# Patient Record
Sex: Male | Born: 1955 | Hispanic: No | Marital: Married | Smoking: Never smoker
Health system: Southern US, Community
[De-identification: ages and names within clinical notes are randomized; demographics above are authoritative.]

## PROBLEM LIST (undated history)

## (undated) DIAGNOSIS — H9190 Unspecified hearing loss, unspecified ear: Secondary | ICD-10-CM

## (undated) DIAGNOSIS — E1140 Type 2 diabetes mellitus with diabetic neuropathy, unspecified: Secondary | ICD-10-CM

## (undated) DIAGNOSIS — K219 Gastro-esophageal reflux disease without esophagitis: Secondary | ICD-10-CM

## (undated) DIAGNOSIS — I10 Essential (primary) hypertension: Secondary | ICD-10-CM

## (undated) DIAGNOSIS — E119 Type 2 diabetes mellitus without complications: Secondary | ICD-10-CM

## (undated) DIAGNOSIS — K3184 Gastroparesis: Principal | ICD-10-CM

## (undated) DIAGNOSIS — R053 Chronic cough: Principal | ICD-10-CM

## (undated) DIAGNOSIS — R42 Dizziness and giddiness: Principal | ICD-10-CM

## (undated) DIAGNOSIS — R55 Syncope and collapse: Secondary | ICD-10-CM

## (undated) DIAGNOSIS — G4700 Insomnia, unspecified: Secondary | ICD-10-CM

## (undated) DIAGNOSIS — R52 Pain, unspecified: Secondary | ICD-10-CM

## (undated) DIAGNOSIS — M25571 Pain in right ankle and joints of right foot: Principal | ICD-10-CM

## (undated) DIAGNOSIS — M25561 Pain in right knee: Secondary | ICD-10-CM

## (undated) DIAGNOSIS — E039 Hypothyroidism, unspecified: Principal | ICD-10-CM

## (undated) DIAGNOSIS — Z0000 Encounter for general adult medical examination without abnormal findings: Secondary | ICD-10-CM

## (undated) DIAGNOSIS — J209 Acute bronchitis, unspecified: Secondary | ICD-10-CM

## (undated) DIAGNOSIS — G43109 Migraine with aura, not intractable, without status migrainosus: Secondary | ICD-10-CM

## (undated) DIAGNOSIS — W108XXS Fall (on) (from) other stairs and steps, sequela: Secondary | ICD-10-CM

## (undated) DIAGNOSIS — Z1211 Encounter for screening for malignant neoplasm of colon: Secondary | ICD-10-CM

## (undated) DIAGNOSIS — E785 Hyperlipidemia, unspecified: Principal | ICD-10-CM

## (undated) HISTORY — DX: Essential (primary) hypertension: I10

## (undated) HISTORY — DX: Unspecified hearing loss, unspecified ear: H91.90

## (undated) HISTORY — DX: Type 2 diabetes mellitus with diabetic neuropathy, unspecified: E11.40

## (undated) HISTORY — DX: Gastro-esophageal reflux disease without esophagitis: K21.9

---

## 2010-06-12 HISTORY — PX: STOMACH SURGERY: SHX791

## 2011-02-02 ENCOUNTER — Inpatient Hospital Stay (INDEPENDENT_AMBULATORY_CARE_PROVIDER_SITE_OTHER)
Admission: RE | Admit: 2011-02-02 | Discharge: 2011-02-02 | Disposition: A | Discharge status: Home / Self Care | Payer: Medicaid Other | Source: Ambulatory Visit | Attending: Family Medicine | Admitting: Family Medicine

## 2011-02-02 DIAGNOSIS — E119 Type 2 diabetes mellitus without complications: Secondary | ICD-10-CM

## 2011-02-02 LAB — POCT URINALYSIS DIP (DEVICE)
Bilirubin Urine: NEGATIVE
Glucose, UA: 500 mg/dL — AB
Leukocytes, UA: NEGATIVE
Nitrite: NEGATIVE
Urobilinogen, UA: 0.2 mg/dL (ref 0.0–1.0)

## 2011-02-02 LAB — POCT I-STAT, CHEM 8
Chloride: 97 mEq/L (ref 96–112)
HCT: 50 % (ref 39.0–52.0)
Hemoglobin: 17 g/dL (ref 13.0–17.0)
Potassium: 4.3 mEq/L (ref 3.5–5.1)
Sodium: 136 mEq/L (ref 135–145)

## 2011-02-14 ENCOUNTER — Inpatient Hospital Stay (HOSPITAL_COMMUNITY)
Admission: EM | Admit: 2011-02-14 | Discharge: 2011-02-18 | DRG: 379 | Disposition: A | Discharge status: Home / Self Care | Payer: Medicaid Other | Source: Ambulatory Visit | Attending: Internal Medicine | Admitting: Internal Medicine

## 2011-02-14 DIAGNOSIS — R634 Abnormal weight loss: Secondary | ICD-10-CM | POA: Diagnosis present

## 2011-02-14 DIAGNOSIS — K259 Gastric ulcer, unspecified as acute or chronic, without hemorrhage or perforation: Secondary | ICD-10-CM | POA: Diagnosis present

## 2011-02-14 DIAGNOSIS — K648 Other hemorrhoids: Secondary | ICD-10-CM | POA: Diagnosis present

## 2011-02-14 DIAGNOSIS — K2970 Gastritis, unspecified, without bleeding: Secondary | ICD-10-CM | POA: Diagnosis present

## 2011-02-14 DIAGNOSIS — K2990 Gastroduodenitis, unspecified, without bleeding: Secondary | ICD-10-CM | POA: Diagnosis present

## 2011-02-14 DIAGNOSIS — K922 Gastrointestinal hemorrhage, unspecified: Principal | ICD-10-CM | POA: Diagnosis present

## 2011-02-14 DIAGNOSIS — IMO0001 Reserved for inherently not codable concepts without codable children: Secondary | ICD-10-CM | POA: Diagnosis present

## 2011-02-14 DIAGNOSIS — Z794 Long term (current) use of insulin: Secondary | ICD-10-CM

## 2011-02-14 LAB — COMPREHENSIVE METABOLIC PANEL
ALT: 23 U/L (ref 0–53)
Albumin: 4.1 g/dL (ref 3.5–5.2)
Alkaline Phosphatase: 83 U/L (ref 39–117)
BUN: 9 mg/dL (ref 6–23)
Chloride: 97 mEq/L (ref 96–112)
Glucose, Bld: 495 mg/dL — ABNORMAL HIGH (ref 70–99)
Potassium: 3.7 mEq/L (ref 3.5–5.1)
Sodium: 133 mEq/L — ABNORMAL LOW (ref 135–145)
Total Bilirubin: 0.9 mg/dL (ref 0.3–1.2)
Total Protein: 7 g/dL (ref 6.0–8.3)

## 2011-02-14 LAB — CBC
HCT: 39.1 % (ref 39.0–52.0)
Hemoglobin: 14 g/dL (ref 13.0–17.0)
MCHC: 35.8 g/dL (ref 30.0–36.0)
RDW: 12.7 % (ref 11.5–15.5)
WBC: 3.8 10*3/uL — ABNORMAL LOW (ref 4.0–10.5)

## 2011-02-14 LAB — DIFFERENTIAL
Basophils Absolute: 0 10*3/uL (ref 0.0–0.1)
Basophils Relative: 1 % (ref 0–1)
Lymphocytes Relative: 41 % (ref 12–46)
Monocytes Absolute: 0.3 10*3/uL (ref 0.1–1.0)
Neutro Abs: 2 10*3/uL (ref 1.7–7.7)

## 2011-02-14 LAB — OCCULT BLOOD, POC DEVICE: Fecal Occult Bld: POSITIVE

## 2011-02-15 LAB — HEMOGLOBIN AND HEMATOCRIT, BLOOD
HCT: 39.9 % (ref 39.0–52.0)
HCT: 40.6 % (ref 39.0–52.0)
HCT: 43.1 % (ref 39.0–52.0)
Hemoglobin: 14.1 g/dL (ref 13.0–17.0)
Hemoglobin: 14.7 g/dL (ref 13.0–17.0)
Hemoglobin: 14.9 g/dL (ref 13.0–17.0)
Hemoglobin: 15.9 g/dL (ref 13.0–17.0)

## 2011-02-15 LAB — HEMOGLOBIN A1C
Hgb A1c MFr Bld: 14.4 % — ABNORMAL HIGH (ref <5.7)
Mean Plasma Glucose: 367 mg/dL — ABNORMAL HIGH (ref <117)

## 2011-02-15 LAB — GLUCOSE, CAPILLARY
Glucose-Capillary: 191 mg/dL — ABNORMAL HIGH (ref 70–99)
Glucose-Capillary: 281 mg/dL — ABNORMAL HIGH (ref 70–99)
Glucose-Capillary: 87 mg/dL (ref 70–99)

## 2011-02-16 ENCOUNTER — Other Ambulatory Visit: Payer: Self-pay | Admitting: Gastroenterology

## 2011-02-16 LAB — GLUCOSE, CAPILLARY
Glucose-Capillary: 102 mg/dL — ABNORMAL HIGH (ref 70–99)
Glucose-Capillary: 105 mg/dL — ABNORMAL HIGH (ref 70–99)
Glucose-Capillary: 205 mg/dL — ABNORMAL HIGH (ref 70–99)
Glucose-Capillary: 96 mg/dL (ref 70–99)

## 2011-02-16 LAB — HEMOGLOBIN AND HEMATOCRIT, BLOOD
HCT: 37.1 % — ABNORMAL LOW (ref 39.0–52.0)
HCT: 37.2 % — ABNORMAL LOW (ref 39.0–52.0)
HCT: 37.6 % — ABNORMAL LOW (ref 39.0–52.0)
HCT: 40.7 % (ref 39.0–52.0)
HCT: 42.1 % (ref 39.0–52.0)
Hemoglobin: 13.3 g/dL (ref 13.0–17.0)
Hemoglobin: 13.8 g/dL (ref 13.0–17.0)

## 2011-02-16 LAB — BASIC METABOLIC PANEL
CO2: 29 mEq/L (ref 19–32)
Calcium: 9.6 mg/dL (ref 8.4–10.5)
Creatinine, Ser: 0.86 mg/dL (ref 0.50–1.35)
GFR calc Af Amer: >60 mL/min (ref >60)
GFR calc non Af Amer: >60 mL/min (ref >60)
Sodium: 145 mEq/L (ref 135–145)

## 2011-02-16 LAB — CBC
MCH: 31.1 pg (ref 26.0–34.0)
MCHC: 36.6 g/dL — ABNORMAL HIGH (ref 30.0–36.0)
MCV: 85 fL (ref 78.0–100.0)
Platelets: 141 10*3/uL — ABNORMAL LOW (ref 150–400)
RBC: 5.01 MIL/uL (ref 4.22–5.81)

## 2011-02-17 LAB — GLUCOSE, CAPILLARY
Glucose-Capillary: 222 mg/dL — ABNORMAL HIGH (ref 70–99)
Glucose-Capillary: 250 mg/dL — ABNORMAL HIGH (ref 70–99)
Glucose-Capillary: 304 mg/dL — ABNORMAL HIGH (ref 70–99)
Glucose-Capillary: 49 mg/dL — ABNORMAL LOW (ref 70–99)

## 2011-02-17 LAB — HEMOGLOBIN AND HEMATOCRIT, BLOOD
HCT: 41.5 % (ref 39.0–52.0)
HCT: 37.8 % — ABNORMAL LOW (ref 39.0–52.0)
Hemoglobin: 14.6 g/dL (ref 13.0–17.0)
Hemoglobin: 15.1 g/dL (ref 13.0–17.0)
Hemoglobin: 13.5 g/dL (ref 13.0–17.0)

## 2011-02-17 LAB — CBC
HCT: 38.3 % — ABNORMAL LOW (ref 39.0–52.0)
MCHC: 35.8 g/dL (ref 30.0–36.0)
Platelets: 138 10*3/uL — ABNORMAL LOW (ref 150–400)
RDW: 13.1 % (ref 11.5–15.5)
WBC: 4.7 10*3/uL (ref 4.0–10.5)

## 2011-02-18 LAB — CBC
HCT: 38 % — ABNORMAL LOW (ref 39.0–52.0)
MCHC: 36.3 g/dL — ABNORMAL HIGH (ref 30.0–36.0)
MCV: 84.8 fL (ref 78.0–100.0)
Platelets: 131 10*3/uL — ABNORMAL LOW (ref 150–400)
RDW: 12.8 % (ref 11.5–15.5)

## 2011-02-18 LAB — BASIC METABOLIC PANEL
BUN: 6 mg/dL (ref 6–23)
Creatinine, Ser: 0.72 mg/dL (ref 0.50–1.35)
GFR calc Af Amer: >60 mL/min (ref >60)
GFR calc non Af Amer: >60 mL/min (ref >60)

## 2011-02-18 LAB — GLUCOSE, CAPILLARY
Glucose-Capillary: 160 mg/dL — ABNORMAL HIGH (ref 70–99)
Glucose-Capillary: 227 mg/dL — ABNORMAL HIGH (ref 70–99)

## 2011-02-18 LAB — HEMOGLOBIN AND HEMATOCRIT, BLOOD
HCT: 38.9 % — ABNORMAL LOW (ref 39.0–52.0)
HCT: 39.1 % (ref 39.0–52.0)
Hemoglobin: 14.4 g/dL (ref 13.0–17.0)
Hemoglobin: 15.1 g/dL (ref 13.0–17.0)

## 2011-02-22 NOTE — Consult Note (Signed)
NAMEBRONCO, MCGRORY                    ACCOUNT NO.:  [id number]  MEDICAL RECORD NO.:  [id number]  LOCATION:  3023                         FACILITY:  [facility]  PHYSICIAN:  Anselmo Rod, MD, FACGDATE OF BIRTH:  Sep 27, 1955  DATE OF CONSULTATION:  02/15/2011 DATE OF DISCHARGE:                                CONSULTATION   REASON FOR CONSULTATION:  Rectal bleeding.  ASSESSMENT: 1. Rectal bleeding for 5 days with a 20-pound weight loss.  The exact     duration of the weight loss is not clear from the patient's family. 2. Newly diagnosed adult-onset diabetes with a hemoglobin A1c of     14.4%.  RECOMMENDATIONS: 1. EGD and colonoscopy tomorrow.  The risks and benefits of the     procedure been discussed with the patient and his son via an     interpreter.  Will prep the patient tonight. 2. CBC in a.m. 3. Further recommendations will be made after the procedure has been     done.  DISCUSSION:  Mr. Shawn Patton is a 55 year old [place] male whom I greeted to the [place] about 3 months ago.  According to his son, he was in his usual state of health until about 5 days ago when he noticed some rectal bleeding with BMs.  There is no history of melena.  His appetite has been fair, but he has lost about 20 pounds in the last few months. There is no history of abdominal pain, nausea, vomiting, fever, chills or rigors.  He has 1 bowel movement per day.  There is no history of recent change in bowel habits.  The patient denies a family history of cancer.  PAST MEDICAL HISTORY:  Noncontributory.  PAST SURGICAL HISTORY:  The patient has not had any surgeries in the past.  ALLERGIES:  No known drug allergies.  MEDICATIONS:  None at home.  REVIEW OF SYSTEMS:  Rectal bleeding, weight loss.  PHYSICAL EXAMINATION:  GENERAL:  The patient is an older [place] male in no acute distress. Temperature is 98.2, blood pressure 107/67, pulse 50 per minute, respiratory rate 16. HEENT:  Atraumatic,  normocephalic head.  Face is symmetric and preserved.  Oral mucosa without exudate. NECK:  Supple.  No JVD, thyromegaly or lymphadenopathy. CHEST:  Clear to auscultation.  S1 and S2 regular. ABDOMEN:  Soft, nontender with normal bowel sounds.  No hepatosplenomegaly. RECTAL:  Deferred.  Laboratory evaluation reveals a hemoglobin of 14.9 with a hematocrit of 40.3 today.  Hemoglobin A1c yesterday was 14.4 with mean plasma glucose of 367.  Stool was positive for occult blood.  CMP revealed a sodium of 133, potassium 3.7, chloride 97, CO2 of 27, glucose 495, BUN 9, creatinine 0.8, total bili of 0.9, alk phos 83, AST 21, ALT 23, total protein 7, albumin 4.1, calcium 9.6.  Considering the above-mentioned data, recommendations have been made. Further recommendation will be made in follow-up after the procedures have been done.     Anselmo Rod, MD, Clementeen Graham     JNM/MEDQ  D:  02/15/2011  T:  02/16/2011  Job:  [id number]  cc:   Fleet Contras, M.D. [employer]/product designer by  Charna Elizabeth M.D. on 02/22/2011 06:41:31 PM

## 2011-02-25 NOTE — Discharge Summary (Signed)
Shawn Patton, Shawn Patton                    ACCOUNT NO.:  [id number]  MEDICAL RECORD NO.:  [id number]  LOCATION:  3023                         FACILITY:  [facility]  PHYSICIAN:  Kela Millin, M.D.DATE OF BIRTH:  [birth year]  DATE OF ADMISSION:  02/14/2011 DATE OF DISCHARGE:  02/18/2011                        DISCHARGE SUMMARY - REFERRING   DISCHARGE DIAGNOSIS: 1. Rectal bleeding - with small internal hemorrhoids per colonoscopy     and moderate diffuse gastritis and small antral ulcer per EGD, also     possibility of an AVM in the duodenal bulb, capsule endoscopy, and     the a also with no clear evidence of active bleeding. 2. Uncontrolled diabetes mellitus - recently diagnosed with elevated     hemoglobin A1c of 14.4.  CONSULTATIONS:  Gastroenterology - Dr. Loreta Ave.  PROCEDURES AND STUDIES: 1. Esophagogastroduodenoscopy - moderate diffuse gastritis and small     clear based antral ulcer - status post biopsy and the patient to     follow up with Dr. Loreta Ave, outpatient for biopsy results and H.     Pylori results. 2. Colonoscopy - small internal hemorrhoids. 3. Capsule endoscopy - gastric antrum with ileal erosion identified.     In the duodenal bulb, there was possibility of an AVM.  No clear     evidence of active bleeding or old blood.  No evidence of any     inflammation, ulcerations, delusions, polyps, masses, or vascular     abnormalities.  BRIEF HISTORY:  The patient is a 55 year old [place] male with above- listed medical problems, who presented with complaints of rectal bleeding x3 days.  It was reported that he had seen Dr. Concepcion Elk with [facility] and following his evaluation, he was sent to the ED for further evaluation and management.  He reported lightheadedness with standing, but denied any syncope.  In the ED, the patient was noted to have a blood glucose of 495 and they indeed subsequently stated that the patient had just recently been started on Lantus and metformin by  Dr. Concepcion Elk.  The patient denied polyuria or polydipsia.  He did admit to weight loss.  He was seen in the ED and admitted for further evaluation and management.  HOSPITAL COURSE: 1. Rectal bleeding - following admission his hemoglobins were cycled     and he was placed on a proton pump inhibitor.  Gastroenterology was     consulted and Dr. Loreta Ave, saw the patient and then EGD was done and     the results as stated above with moderate diffuse gastritis and a     small clear based antral ulcer which was biopsied and sent for H.     Pylori as well.  A colonoscopy was done and some fresh blood was     noted in the right colon, but otherwise only small internal     hemorrhoids reported.  Following this capsule endoscopy was done     and it came back with the possibility of an AVM in the duodenal     bulb and also showed the gastric antrum with linear erosions.  The     patient was maintained on a proton  pump inhibitor and he did not     have any further bleeding during this hospital stay.  His     hemoglobin has remained stable - 15.1 with a hematocrit of 41.3     prior to discharge today.  The patient is to follow up with Dr.     Loreta Ave, for biopsy results as well as H. pylori results as an     outpatient. 2. Uncontrolled diabetes mellitus - his Accu-Cheks were monitored,     during this hospital stay he was placed on Lantus and cover with     sliding scale insulin.  He did receive inpatient diabetes     education.  His Lantus was adjusted to 18 units at bedtime and he     is to restart his metformin at 500 mg daily to keep a log of his     blood sugars as instructed and followup with Dr. Concepcion Elk,     outpatient for further monitoring and adjustment of his medications     as clinically appropriate for optimal blood glucose control. 3. Gastritis with antral gastric ulcer - as above.  The patient is to     continue the PPI upon discharge and he is to follow up with Dr.     Loreta Ave.  DISCHARGE  MEDICATIONS: 1. Lantus 18 units subcu nightly. 2. Metformin 500 mg p.o. daily. 3. Prilosec 40 mg p.o. daily.  FOLLOWUP CARE: 1. Dr. Concepcion Elk of very in 1-2 weeks - call for appointment. 2. Dr. Loreta Ave in 2-3 weeks - call for appointment.  DISCHARGE CONDITION:  Improved/stable.     Kela Millin, M.D.     ACV/MEDQ  D:  02/18/2011  T:  02/18/2011  Job:  [id number]  cc:   Anselmo Rod, MD, Bertrum Sol, M.D.  Electronically Signed by Donnalee Curry M.D. on 02/25/2011 05:42:10 PM

## 2011-03-06 NOTE — H&P (Signed)
Shawn Patton, Shawn Patton                    ACCOUNT NO.:  [id number]  MEDICAL RECORD NO.:  [id number]  LOCATION:  [facility]                         FACILITY:  [facility]  PHYSICIAN:  Jonny Ruiz, MD    DATE OF BIRTH:  1955/09/08  DATE OF ADMISSION:  02/14/2011 DATE OF DISCHARGE:                             HISTORY & PHYSICAL   CHIEF COMPLAINT:  Rectal bleeding.  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  The history is obtained with the help of his 2 sons since the patient speaks no [place], only [place]  The patient had apparently saw Dr. Concepcion Elk today and reported rectal bleeding for 15 days and was sent to the emergency department for further evaluation. No abdominal pain, nausea or vomiting.  There is lightheadedness upon standing without syncope.  When I obtained history from his son, the patient reported only bleeding for the last 3 days and not 15 days.  In addition, when he was evaluated in the emergency department, he was found with a blood sugar of 495.  The patient denies any polyuria, polydipsia but does admit to weight loss.  PAST MEDICAL HISTORY:  Denied.  SURGICAL HISTORY:  Denied.  MEDICATIONS:  None although the ED rapport says that he was started on metformin on February 02, 2011, but the patient tells me that he is not taking any medications.  ALLERGIES:  No known drug allergies.  FAMILY HISTORY:  Unable to obtain.  SOCIAL HISTORY:  The patient is unemployed.  He lives with his wife and 4 sons in one apartment.  Denies tobacco or drugs.  Rarely drinks.  REVIEW OF SYSTEMS:  See HPI.  PHYSICAL EXAMINATION:  VITAL SIGNS:  Blood pressure in the supine position 113/63, pulse 47, blood pressure in the upstanding position 122/70, heart rate 62, respirations 16, temperature 98.4. GENERAL APPEARANCE:  The patient is a middle age Asian man who appears in no  acute distress whatsoever. SKIN AND MUCOSA:  No pallor, jaundice or cyanosis. HEENT:  Unremarkable. NECK:  Supple.  No JVD.  No lymphadenopathy. HEART:  Regular S1-S2 without gallops, murmurs or rubs. LUNGS:  Clear to auscultation. ABDOMEN:  Soft, nontender without organomegaly or masses palpable. RECTAL:  Deferred since ED physician performed exam and Hemoccult was positive. EXTREMITIES:  Without clubbing, cyanosis or edema. NEUROLOGIC:  Nonfocal.  LABORATORY DATA:  Comprehensive metabolic panel is normal except for a glucose of 495.  CBC normal with a hemoglobin of 14, hematocrit 39.1, occult blood positive.  ASSESSMENT AND PLAN:  A 55 year old male [place] with no significant past medical history admitted to the hospital for lower GI bleed without orthostatic changes and with a normal H and H Hemoccult positive as well as incidental hyperglycemia at 495 without DKA.  PROBLEM LIST: 1. Lower gastrointestinal bleed. 2. Hyperglycemia, new-onset diabetes mellitus type 2.  PLAN: 1. Admit the patient to the hospital. 2. Two large bore IV access. 3. H and H q. 4 hours. 4. PT/PTT. 5. Lantus 15 units basal followed by insulin NovoLog sliding scale     coverage. 6. He  will obtain hemoglobin A1c and lipid profile. 7. Obtain GI consult for colonoscopy.  Repeat BMET in a.m.          ______________________________ Jonny Ruiz, MD     GL/MEDQ  D:  02/14/2011  T:  02/14/2011  Job:  [id number]  Electronically Signed by Jonny Ruiz MD on 03/06/2011 04:10:53 PM

## 2011-03-10 ENCOUNTER — Encounter: Payer: Medicaid Other | Attending: Internal Medicine | Admitting: Dietician

## 2011-03-10 ENCOUNTER — Encounter: Payer: Self-pay | Admitting: Dietician

## 2011-03-10 DIAGNOSIS — E119 Type 2 diabetes mellitus without complications: Secondary | ICD-10-CM | POA: Insufficient documentation

## 2011-03-10 DIAGNOSIS — Z713 Dietary counseling and surveillance: Secondary | ICD-10-CM | POA: Insufficient documentation

## 2011-03-10 NOTE — Progress Notes (Signed)
   Medical Nutrition Therapy:  Appt start time: 0900 end time:  1100.  Assessment:  Primary concerns today: New onset of DM type 2.  Questions what he can eat.  He is from [place] and does not speak or understand English.  His son is with him and he speaks and reads [place] fluently.  The interpreter is present also and assisting in the session.  His weight today is 152.9 lbs.  He reports he weighed 70 KG prior to his arrival in this country.  In early September he was hospitalized for hyperglycemia which was discovered when he went for an evaluation of rectal bleeding.  He was admitted to the hospital and on 02/14/11 found to have a HgA1C of 14.4%.  His glucose levels were stabilized and and he was found to have mild gastritis and internal hemorrhoids.  He has been trying to follow a diet that his son helps him with and is riding a stationary bike for exercise.  MEDICATIONS: Lantus insulin, Metformin, Nexium, Omeprazol, and Bupap for discomfort as needed.  DIETARY INTAKE:  24-hr recall:  B (7:30 AM): 2 bread and apple (1/2) water (warm and salty), Milk 1 glass  L (12:00 PM): Rice (1/2 C) or bread Flat bread (15 gm)some vegetable with curry and sometimes chicken with curry.  Snk (4:00 PM): Salad with some fruit D (8:00 PM):bread (2)or noodles (1-2 cups)vegetables with curry (nonstarchy 1 C) Beverages: milk, water,   Recent physical activity: Riding an exercise bike 60 minutes in the AM and again in the PM  Estimated energy needs: 1500-1600 calories/day 165-170 g carbohydrates 110-115 g protein 40-42 g fat  Progress Towards Goal(s):  In progress.   Nutritional Diagnosis:  [place]-2.1 Inpaired nutrition utilization As related to glucose.  As evidenced by New onset DM type 2 and HgA1C of 14.4%.    Intervention:  Nutrition Current intake which his son helps with planing is deficit at times in protein.  Has been limiting carbohydrate.  I used the plate method for teaching and used an actual plate size  picture and food models to help with food choices and portions for each meal and snack.  Recommended adding an egg as protein at the morning meal and to add protein to the mid-day or evening meal  Provided models for the actual serving sizes.  Handouts given during visit include:  American Diabetes Association booklet: Living Well with Diabetes  American Diabetes booklet:  Where Do I Begin; Living with type 2 diabetes. These text will support his son in helping him to care for himself.  I have no references in [place].  Monitoring/Evaluation:  Dietary intake, exercise, blood glucose levels, and body weight in 6-8 weeks.  The son is to call for an appointment.

## 2011-03-10 NOTE — Patient Instructions (Signed)
   Call Dr. Jacelyn Pi office (the eye doctor), for an earlier appointment.  The Metformin medication is 500 mg per day.  Take with food.  Keep the carbohydrate/starch to 1/4 of the plate or rice 1/2 cup and noodles 3/4 to 1 cup and the starchy beans to 1 cup.  The fruit use the serving sizes in the handout.  The milk is 8 oz for 1 serving.  Meals need to be carbohydrate serving, non-starchy vegetable, and a protein.  Snacks can be a bread or a fruit.  Add a protein to the breakfast meal.  Can use the egg or might add a vegetable.  Check the feet each day.  Bring the meter and glucose log book to each appointment.  Bring the medications to all visits.  Plan to follow up with me in 6-8 weeks about the second week in November  (12-16th)  Call if you have questions.  [phone])

## 2011-04-24 ENCOUNTER — Ambulatory Visit
Admission: RE | Admit: 2011-04-24 | Discharge: 2011-04-24 | Disposition: A | Discharge status: Home / Self Care | Payer: No Typology Code available for payment source | Source: Ambulatory Visit | Attending: Infectious Diseases | Admitting: Infectious Diseases

## 2011-04-24 ENCOUNTER — Other Ambulatory Visit: Payer: Self-pay | Admitting: Infectious Diseases

## 2011-04-24 DIAGNOSIS — R7611 Nonspecific reaction to tuberculin skin test without active tuberculosis: Secondary | ICD-10-CM

## 2012-08-14 ENCOUNTER — Encounter (HOSPITAL_COMMUNITY): Payer: Self-pay | Admitting: *Deleted

## 2012-08-14 ENCOUNTER — Emergency Department (HOSPITAL_COMMUNITY)
Admission: EM | Admit: 2012-08-14 | Discharge: 2012-08-14 | Disposition: A | Discharge status: Home / Self Care | Payer: No Typology Code available for payment source | Source: Home / Self Care

## 2012-08-14 DIAGNOSIS — E119 Type 2 diabetes mellitus without complications: Secondary | ICD-10-CM

## 2012-08-14 MED ORDER — METFORMIN HCL 500 MG PO TABS: 500.0000 mg | ORAL_TABLET | Freq: Two times a day (BID) | ORAL | Status: DC

## 2012-08-14 MED ORDER — ESOMEPRAZOLE MAGNESIUM 40 MG PO CPDR: 40.0000 mg | DELAYED_RELEASE_CAPSULE | Freq: Every day | ORAL | Status: DC

## 2012-08-14 MED ORDER — INSULIN GLARGINE 100 UNIT/ML ~~LOC~~ SOLN: 15.0000 [IU] | Freq: Every day | SUBCUTANEOUS | Status: DC

## 2012-08-14 MED ORDER — FREESTYLE SYSTEM KIT: 1.0000 | PACK | Freq: Three times a day (TID) | Status: DC

## 2012-08-14 NOTE — ED Notes (Signed)
Patient presents for follow up and medication refill.

## 2012-08-14 NOTE — ED Notes (Signed)
Collected lavender topped venipuncture sample per physician orders for A1C.

## 2012-08-14 NOTE — ED Provider Notes (Signed)
Patient Demographics  Shawn Patton, is a 57 y.o. male  [id number]  [id number]  DOB - 1955-07-27  Chief Complaint  Patient presents with  . Follow-up        Subjective:   Shawn Patton today has, No headache, No chest pain, No abdominal pain - No Nausea, No new weakness tingling or numbness, No Cough - SOB.    Objective:    Filed Vitals:   08/14/12 1243  BP: 152/77  Pulse: 53  Temp: 98.7 F (37.1 C)  TempSrc: Oral  Resp: 16  SpO2: 98%     Exam  Awake Alert, Oriented X 3, No new F.N deficits, Normal affect [place].AT,PERRAL Supple Neck,No JVD, No cervical lymphadenopathy appriciated.  Symmetrical Chest wall movement, Good air movement bilaterally, CTAB RRR,No Gallops,Rubs or new Murmurs, No Parasternal Heave +ve B.Sounds, Abd Soft, Non tender, No organomegaly appriciated, No rebound - guarding or rigidity. No Cyanosis, Clubbing or edema, No new Rash or bruise       Data Review   CBC No results found for this basename: WBC, HGB, HCT, PLT, MCV, MCH, MCHC, RDW, NEUTRABS, LYMPHSABS, MONOABS, EOSABS, BASOSABS, BANDABS, BANDSABD,  in the last 168 hours  Chemistries   No results found for this basename: NA, K, CL, CO2, GLUCOSE, BUN, CREATININE, GFRCGP, CALCIUM, MG, AST, ALT, ALKPHOS, BILITOT,  in the last 168 hours ------------------------------------------------------------------------------------------------------------------ No results found for this basename: HGBA1C,  in the last 72 hours ------------------------------------------------------------------------------------------------------------------ No results found for this basename: CHOL, HDL, LDLCALC, TRIG, CHOLHDL, LDLDIRECT,  in the last 72 hours ------------------------------------------------------------------------------------------------------------------ No results found for this basename: TSH, T4TOTAL, FREET3, T3FREE, THYROIDAB,  in the last 72  hours ------------------------------------------------------------------------------------------------------------------ No results found for this basename: VITAMINB12, FOLATE, FERRITIN, TIBC, IRON, RETICCTPCT,  in the last 72 hours  Coagulation profile  No results found for this basename: INR, PROTIME,  in the last 168 hours  Lab Results  Component Value Date   HGBA1C 14.4* 02/14/2011      Prior to Admission medications   Medication Sig Start Date End Date Taking? Authorizing Provider  ACETAMINOPHEN-BUTALBITAL (BUPAP) 50-650 MG TABS Take 1 tablet by mouth 2 (two) times daily as needed.      Historical Provider, MD  esomeprazole (NEXIUM) 40 MG capsule Take 1 capsule (40 mg total) by mouth daily before breakfast. 08/14/12   Leroy Sea, MD  glucose monitoring kit (FREESTYLE) monitoring kit 1 each by Does not apply route 4 (four) times daily - after meals and at bedtime. 1 month Diabetic Testing Supplies for QAC-QHS accuchecks. 08/14/12   Leroy Sea, MD  insulin glargine (LANTUS) 100 UNIT/ML injection Inject 15 Units into the skin at bedtime. 08/14/12   Leroy Sea, MD  metFORMIN (GLUCOPHAGE) 500 MG tablet Take 1 tablet (500 mg total) by mouth 2 (two) times daily with a meal. 08/14/12   Leroy Sea, MD     Assessment & Plan   Patient with history of diabetes mellitus type 2 her for routine followup visit and to get his medication refills. Remember is the interpreter. Says sugars have been running between 120-160. Have increased his Glucophage to twice a day, continue his home dose Lantus, will check A1c, have instructed to do Accu-Cheks q. a.c. at bedtime and bring the logbook next time on his visit so that we can check glycemic control closely.    Follow-up Information   Follow up with Primary care provider. Schedule an appointment as soon as possible for a visit in 1 month. Event organiser  your Accu-Chek logbook as instructed)        Leroy Sea M.D on 08/14/2012 at 12:52  PM  Leroy Sea, MD 08/14/12 [phone]

## 2012-08-15 LAB — HEMOGLOBIN A1C: Hgb A1c MFr Bld: 10.9 % — ABNORMAL HIGH (ref <5.7)

## 2012-11-22 NOTE — Progress Notes (Signed)
Quick Note:  Please hava patient come back for DM Management ______

## 2012-12-02 ENCOUNTER — Telehealth: Payer: Self-pay | Admitting: *Deleted

## 2012-12-02 NOTE — Telephone Encounter (Signed)
12/02/12 Patient made aware message left via telephone to come back for  Diabetes Management. P.Nikayla Madaris,RN BSN [facility]

## 2014-04-26 ENCOUNTER — Inpatient Hospital Stay (HOSPITAL_COMMUNITY)
Admission: EM | Admit: 2014-04-26 | Discharge: 2014-04-28 | DRG: 638 | Disposition: A | Discharge status: Home / Self Care | Payer: Self-pay | Attending: Internal Medicine | Admitting: Internal Medicine

## 2014-04-26 ENCOUNTER — Encounter (HOSPITAL_COMMUNITY): Payer: Self-pay

## 2014-04-26 DIAGNOSIS — R112 Nausea with vomiting, unspecified: Secondary | ICD-10-CM | POA: Diagnosis present

## 2014-04-26 DIAGNOSIS — E1310 Other specified diabetes mellitus with ketoacidosis without coma: Principal | ICD-10-CM | POA: Diagnosis present

## 2014-04-26 DIAGNOSIS — R109 Unspecified abdominal pain: Secondary | ICD-10-CM | POA: Diagnosis present

## 2014-04-26 DIAGNOSIS — R1013 Epigastric pain: Secondary | ICD-10-CM

## 2014-04-26 DIAGNOSIS — R7989 Other specified abnormal findings of blood chemistry: Secondary | ICD-10-CM | POA: Diagnosis present

## 2014-04-26 DIAGNOSIS — E871 Hypo-osmolality and hyponatremia: Secondary | ICD-10-CM | POA: Diagnosis present

## 2014-04-26 DIAGNOSIS — Z23 Encounter for immunization: Secondary | ICD-10-CM

## 2014-04-26 DIAGNOSIS — E1110 Type 2 diabetes mellitus with ketoacidosis without coma: Secondary | ICD-10-CM | POA: Diagnosis present

## 2014-04-26 DIAGNOSIS — E876 Hypokalemia: Secondary | ICD-10-CM | POA: Diagnosis present

## 2014-04-26 DIAGNOSIS — Z79899 Other long term (current) drug therapy: Secondary | ICD-10-CM

## 2014-04-26 DIAGNOSIS — E0810 Diabetes mellitus due to underlying condition with ketoacidosis without coma: Secondary | ICD-10-CM

## 2014-04-26 DIAGNOSIS — Z794 Long term (current) use of insulin: Secondary | ICD-10-CM

## 2014-04-26 HISTORY — DX: Type 2 diabetes mellitus without complications: E11.9

## 2014-04-26 LAB — COMPREHENSIVE METABOLIC PANEL
ALK PHOS: 112 U/L (ref 39–117)
ALT: 23 U/L (ref 0–53)
AST: 19 U/L (ref 0–37)
Albumin: 4 g/dL (ref 3.5–5.2)
Anion gap: 20 — ABNORMAL HIGH (ref 5–15)
BUN: 18 mg/dL (ref 6–23)
CO2: 18 meq/L — AB (ref 19–32)
Calcium: 9.3 mg/dL (ref 8.4–10.5)
Chloride: 95 mEq/L — ABNORMAL LOW (ref 96–112)
Creatinine, Ser: 0.66 mg/dL (ref 0.50–1.35)
GFR calc non Af Amer: >90 mL/min (ref >90)
GLUCOSE: 475 mg/dL — AB (ref 70–99)
POTASSIUM: 4.5 meq/L (ref 3.7–5.3)
SODIUM: 133 meq/L — AB (ref 137–147)
TOTAL PROTEIN: 7.5 g/dL (ref 6.0–8.3)
Total Bilirubin: 1.1 mg/dL (ref 0.3–1.2)

## 2014-04-26 LAB — I-STAT VENOUS BLOOD GAS, ED
ACID-BASE DEFICIT: 5 mmol/L — AB (ref 0.0–2.0)
BICARBONATE: 22.4 meq/L (ref 20.0–24.0)
O2 SAT: 38 %
PO2 VEN: 26 mmHg — AB (ref 30.0–45.0)
TCO2: 24 mmol/L (ref 0–100)
pCO2, Ven: 48.1 mmHg (ref 45.0–50.0)
pH, Ven: 7.275 (ref 7.250–7.300)

## 2014-04-26 LAB — CBG MONITORING, ED: Glucose-Capillary: 512 mg/dL — ABNORMAL HIGH (ref 70–99)

## 2014-04-26 LAB — URINALYSIS, ROUTINE W REFLEX MICROSCOPIC
BILIRUBIN URINE: NEGATIVE
HGB URINE DIPSTICK: NEGATIVE
KETONES UR: 40 mg/dL — AB
LEUKOCYTES UA: NEGATIVE
Nitrite: NEGATIVE
PROTEIN: NEGATIVE mg/dL
Specific Gravity, Urine: 1.035 — ABNORMAL HIGH (ref 1.005–1.030)
Urobilinogen, UA: 0.2 mg/dL (ref 0.0–1.0)
pH: 5 (ref 5.0–8.0)

## 2014-04-26 LAB — CBC
HCT: 41.7 % (ref 39.0–52.0)
HEMOGLOBIN: 14.6 g/dL (ref 13.0–17.0)
MCH: 30.6 pg (ref 26.0–34.0)
MCHC: 35 g/dL (ref 30.0–36.0)
MCV: 87.4 fL (ref 78.0–100.0)
Platelets: 126 10*3/uL — ABNORMAL LOW (ref 150–400)
RBC: 4.77 MIL/uL (ref 4.22–5.81)
RDW: 11.9 % (ref 11.5–15.5)
WBC: 4.2 10*3/uL (ref 4.0–10.5)

## 2014-04-26 LAB — URINE MICROSCOPIC-ADD ON

## 2014-04-26 LAB — BASIC METABOLIC PANEL
ANION GAP: 13 (ref 5–15)
ANION GAP: 13 (ref 5–15)
BUN: 13 mg/dL (ref 6–23)
BUN: 12 mg/dL (ref 6–23)
CHLORIDE: 107 meq/L (ref 96–112)
CO2: 19 mEq/L (ref 19–32)
CO2: 18 mEq/L — ABNORMAL LOW (ref 19–32)
Calcium: 8.2 mg/dL — ABNORMAL LOW (ref 8.4–10.5)
Calcium: 8.2 mg/dL — ABNORMAL LOW (ref 8.4–10.5)
Chloride: 105 mEq/L (ref 96–112)
Creatinine, Ser: 0.59 mg/dL (ref 0.50–1.35)
Creatinine, Ser: 0.59 mg/dL (ref 0.50–1.35)
GFR calc Af Amer: >90 mL/min (ref >90)
Glucose, Bld: 276 mg/dL — ABNORMAL HIGH (ref 70–99)
Glucose, Bld: 210 mg/dL — ABNORMAL HIGH (ref 70–99)
POTASSIUM: 3.8 meq/L (ref 3.7–5.3)
POTASSIUM: 3.6 meq/L — AB (ref 3.7–5.3)
SODIUM: 138 meq/L (ref 137–147)
Sodium: 137 mEq/L (ref 137–147)

## 2014-04-26 LAB — MRSA PCR SCREENING: MRSA BY PCR: NEGATIVE

## 2014-04-26 LAB — TSH: TSH: 0.019 u[IU]/mL — ABNORMAL LOW (ref 0.350–4.500)

## 2014-04-26 MED ORDER — DEXTROSE-NACL 5-0.45 % IV SOLN: INTRAVENOUS | Status: DC

## 2014-04-26 MED ORDER — DEXTROSE-NACL 5-0.45 % IV SOLN
INTRAVENOUS | Status: DC
  Administered 2014-04-26: 1000 mL via INTRAVENOUS

## 2014-04-26 MED ORDER — SODIUM CHLORIDE 0.9 % IV SOLN
1000.0000 mL | Freq: Once | INTRAVENOUS | Status: AC
  Administered 2014-04-26: 1000 mL via INTRAVENOUS

## 2014-04-26 MED ORDER — SODIUM CHLORIDE 0.9 % IV SOLN
INTRAVENOUS | Status: DC
  Administered 2014-04-26: 3.1 [IU]/h via INTRAVENOUS
  Filled 2014-04-26: qty 2.5

## 2014-04-26 MED ORDER — PANTOPRAZOLE SODIUM 40 MG PO TBEC
40.0000 mg | DELAYED_RELEASE_TABLET | Freq: Every day | ORAL | Status: DC
  Administered 2014-04-26: 40 mg via ORAL
  Filled 2014-04-26: qty 1

## 2014-04-26 MED ORDER — HEPARIN SODIUM (PORCINE) 5000 UNIT/ML IJ SOLN
5000.0000 [IU] | Freq: Three times a day (TID) | INTRAMUSCULAR | Status: DC
  Administered 2014-04-26 – 2014-04-28 (×4): 5000 [IU] via SUBCUTANEOUS
  Filled 2014-04-26 (×8): qty 1

## 2014-04-26 MED ORDER — DEXTROSE 50 % IV SOLN: 25.0000 mL | INTRAVENOUS | Status: DC | PRN

## 2014-04-26 MED ORDER — SODIUM CHLORIDE 0.9 % IV SOLN
INTRAVENOUS | Status: DC
  Administered 2014-04-26: 7.1 [IU]/h via INTRAVENOUS
  Administered 2014-04-27: 3.2 [IU]/h via INTRAVENOUS
  Administered 2014-04-27: 5.6 [IU]/h via INTRAVENOUS
  Administered 2014-04-27: 1 [IU]/h via INTRAVENOUS
  Filled 2014-04-26: qty 2.5

## 2014-04-26 MED ORDER — SODIUM CHLORIDE 0.9 % IV SOLN: INTRAVENOUS | Status: AC

## 2014-04-26 MED ORDER — POTASSIUM CHLORIDE 10 MEQ/100ML IV SOLN
10.0000 meq | INTRAVENOUS | Status: AC
  Administered 2014-04-26 (×2): 10 meq via INTRAVENOUS
  Filled 2014-04-26 (×2): qty 100

## 2014-04-26 MED ORDER — SODIUM CHLORIDE 0.9 % IV SOLN
1000.0000 mL | INTRAVENOUS | Status: DC
  Administered 2014-04-26: 1000 mL via INTRAVENOUS

## 2014-04-26 MED ORDER — SODIUM CHLORIDE 0.9 % IV SOLN
INTRAVENOUS | Status: DC
  Administered 2014-04-26: 1000 mL via INTRAVENOUS

## 2014-04-26 NOTE — ED Notes (Addendum)
Pt is diabetic and for the past month has had a hard time keeping his sugar regulated. Pt told his son he didn't feel well and has been vomiting and urinating frequently. Has not any of his medications in about 2 months the son says.

## 2014-04-26 NOTE — ED Notes (Signed)
Attempted report 

## 2014-04-26 NOTE — Progress Notes (Signed)
Rec'd pt from ER via stretcher on monitor. Pt assisted in to bed CHG bath administered and VS obtained.

## 2014-04-26 NOTE — Progress Notes (Signed)
Obtained history with help from his son who served as the interpreter.

## 2014-04-26 NOTE — ED Provider Notes (Addendum)
CSN: [id number]     Arrival date & time 04/26/14  1445 History   First MD Initiated Contact with Patient 04/26/14 1616     Chief Complaint  Patient presents with  . Hyperglycemia     (Consider location/radiation/quality/duration/timing/severity/associated sxs/prior Treatment) HPI Comments: Patient presents to the ED with a chief complaint of hyperglycemia.  Patient states that he is an insulin controlled diabetic.  He states that he has been out of his diabetes medications for 2-3 months.  He states that during this time, he has noticed increased polydipsia, polyuria, intermittent nausea and vomiting, peripheral neuropathy, and blurred vision.  He states that today the symptoms got bad enough to be evaluated.  He has not tried taking anything for his symptoms.  He states that he does not have the financial resources to get his medications.  He denies any other symptoms at this time.  The history is provided by the patient. The history is limited by a language barrier. A language interpreter was used.    Past Medical History  Diagnosis Date  . Diabetes mellitus without complication    History reviewed. No pertinent past surgical history. History reviewed. No pertinent family history. History  Substance Use Topics  . Smoking status: Never Smoker   . Smokeless tobacco: Never Used  . Alcohol Use: No    Review of Systems  Constitutional: Negative for fever and chills.  Respiratory: Negative for shortness of breath.   Cardiovascular: Negative for chest pain.  Gastrointestinal: Positive for nausea, vomiting and abdominal pain. Negative for diarrhea and constipation.  Genitourinary: Negative for dysuria.  All other systems reviewed and are negative.     Allergies  Review of patient's allergies indicates no known allergies.  Home Medications   Prior to Admission medications   Medication Sig Start Date End Date Taking? Authorizing Provider  ACETAMINOPHEN-BUTALBITAL (BUPAP) 50-650  MG TABS Take 1 tablet by mouth 2 (two) times daily as needed.      Historical Provider, MD  esomeprazole (NEXIUM) 40 MG capsule Take 1 capsule (40 mg total) by mouth daily before breakfast. 08/14/12   Thurnell Lose, MD  glucose monitoring kit (FREESTYLE) monitoring kit 1 each by Does not apply route 4 (four) times daily - after meals and at bedtime. 1 month Diabetic Testing Supplies for QAC-QHS accuchecks. 08/14/12   Thurnell Lose, MD  insulin glargine (LANTUS) 100 UNIT/ML injection Inject 15 Units into the skin at bedtime. 08/14/12   Thurnell Lose, MD  metFORMIN (GLUCOPHAGE) 500 MG tablet Take 1 tablet (500 mg total) by mouth 2 (two) times daily with a meal. 08/14/12   Thurnell Lose, MD   BP 116/63 mmHg  Pulse 76  Temp(Src) 98.4 F (36.9 C) (Oral)  Resp 18  SpO2 100% Physical Exam  Constitutional: He is oriented to person, place, and time. He appears well-developed and well-nourished.  HENT:  Head: Normocephalic and atraumatic.  Eyes: Conjunctivae and EOM are normal. Pupils are equal, round, and reactive to light. Right eye exhibits no discharge. Left eye exhibits no discharge. No scleral icterus.  Neck: Normal range of motion. Neck supple. No JVD present.  Cardiovascular: Normal rate, regular rhythm and normal heart sounds.  Exam reveals no gallop and no friction rub.   No murmur heard. Pulmonary/Chest: Effort normal and breath sounds normal. No respiratory distress. He has no wheezes. He has no rales. He exhibits no tenderness.  Abdominal: Soft. He exhibits no distension and no mass. There is no tenderness. There is  no rebound and no guarding.  Musculoskeletal: Normal range of motion. He exhibits no edema or tenderness.  Neurological: He is alert and oriented to person, place, and time.  Skin: Skin is warm and dry.  Psychiatric: He has a normal mood and affect. His behavior is normal. Judgment and thought content normal.  Nursing note and vitals reviewed.   ED Course  Procedures  (including critical care time) Results for orders placed or performed during the hospital encounter of 04/26/14  Comprehensive metabolic panel  Result Value Ref Range   Sodium 133 (L) 137 - 147 mEq/L   Potassium 4.5 3.7 - 5.3 mEq/L   Chloride 95 (L) 96 - 112 mEq/L   CO2 18 (L) 19 - 32 mEq/L   Glucose, Bld 475 (H) 70 - 99 mg/dL   BUN 18 6 - 23 mg/dL   Creatinine, Ser 0.66 0.50 - 1.35 mg/dL   Calcium 9.3 8.4 - 10.5 mg/dL   Total Protein 7.5 6.0 - 8.3 g/dL   Albumin 4.0 3.5 - 5.2 g/dL   AST 19 0 - 37 U/L   ALT 23 0 - 53 U/L   Alkaline Phosphatase 112 39 - 117 U/L   Total Bilirubin 1.1 0.3 - 1.2 mg/dL   GFR calc non Af Amer >90 >90 mL/min   GFR calc Af Amer >90 >90 mL/min   Anion gap 20 (H) 5 - 15  CBC  Result Value Ref Range   WBC 4.2 4.0 - 10.5 K/uL   RBC 4.77 4.22 - 5.81 MIL/uL   Hemoglobin 14.6 13.0 - 17.0 g/dL   HCT 41.7 39.0 - 52.0 %   MCV 87.4 78.0 - 100.0 fL   MCH 30.6 26.0 - 34.0 pg   MCHC 35.0 30.0 - 36.0 g/dL   RDW 11.9 11.5 - 15.5 %   Platelets 126 (L) 150 - 400 K/uL  Urinalysis, Routine w reflex microscopic  Result Value Ref Range   Color, Urine YELLOW YELLOW   APPearance CLEAR CLEAR   Specific Gravity, Urine 1.035 (H) 1.005 - 1.030   pH 5.0 5.0 - 8.0   Glucose, UA >1000 (A) NEGATIVE mg/dL   Hgb urine dipstick NEGATIVE NEGATIVE   Bilirubin Urine NEGATIVE NEGATIVE   Ketones, ur 40 (A) NEGATIVE mg/dL   Protein, ur NEGATIVE NEGATIVE mg/dL   Urobilinogen, UA 0.2 0.0 - 1.0 mg/dL   Nitrite NEGATIVE NEGATIVE   Leukocytes, UA NEGATIVE NEGATIVE  Urine microscopic-add on  Result Value Ref Range   Squamous Epithelial / LPF RARE RARE  CBG monitoring, ED  Result Value Ref Range   Glucose-Capillary 512 (H) 70 - 99 mg/dL   Comment 1 Notify RN   I-Stat Venous Blood Gas, ED (order at [facility] [address] and [facility] only)  Result Value Ref Range   pH, Ven 7.275 7.250 - 7.300   pCO2, Ven 48.1 45.0 - 50.0 mmHg   pO2, Ven 26.0 (LL) 30.0 - 45.0 mmHg   Bicarbonate 22.4 20.0 - 24.0 mEq/L    TCO2 24 0 - 100 mmol/L   O2 Saturation 38.0 %   Acid-base deficit 5.0 (H) 0.0 - 2.0 mmol/L   Collection site BRACHIAL ARTERY    Sample type VENOUS    Comment NOTIFIED PHYSICIAN    No results found.   Imaging Review No results found.   EKG Interpretation None      MDM   Final diagnoses:  Diabetic ketoacidosis without coma associated with other specified diabetes mellitus   Non-compliant, insulin controlled diabetic.  Hyperglycemia  to 512.  Anion gap of 20.  Will treat with fluids and glucose stabilizer.  Anticipate admission.  Medications  insulin regular (NOVOLIN R,HUMULIN R) 250 Units in sodium chloride 0.9 % 250 mL (1 Units/mL) infusion (not administered)  0.9 %  sodium chloride infusion (1,000 mLs Intravenous New Bag/Given 04/26/14 1703)    Followed by  0.9 %  sodium chloride infusion (1,000 mLs Intravenous New Bag/Given 04/26/14 1642)    Followed by  0.9 %  sodium chloride infusion (not administered)    Followed by  0.9 %  sodium chloride infusion (not administered)  dextrose 5 %-0.45 % sodium chloride infusion (not administered)        ICD-9-CM ICD-10-CM   1. Diabetic ketoacidosis without coma associated with other specified diabetes mellitus 250.12 E13.10        Montine Circle, PA-C 04/26/14 [place], MD 04/26/14 1938  Montine Circle, PA-C 05/14/14 [place], MD 05/15/14 2347

## 2014-04-26 NOTE — H&P (Signed)
[facility] History and Physical  [name] WYO:[id number] DOB: 1955-10-21 DOA: 04/26/2014  Referring physician:  PCP: Shawn Fendt, MD   Chief Complaint: Abdominal pain/nausea/vomiting  HPI: Shawn Patton is a 58 y.o. male with a past medical history of insulin-dependent diabetes mellitus who presents to the emergency room with complaints of abdominal pain, nausea, vomiting. History was obtained from Shawn Patton's son who was present at bedside. Shawn Patton has felt ill for the past month having episodic nausea vomiting, abdominal pain worse over the last several days. He complains of associated generalized weakness, malaise, fatigue. His son reporting that he has not taken insulin for the past year due difficulties affording his medicine, and have been trying to manage his blood sugars with diet and exercise. His son reporting that his family rely's solely on his income and can not afford his parent's medications. In the emergency room he was found to have a blood sugar of 475 with anion gap of 20, pH of 7.275. He was started on IV insulin and IV fluids.   Review of Systems:  Constitutional:  No weight loss, night sweats, Fevers, positive forchills, fatigue, malaise, feeling ill HEENT:  No headaches, Difficulty swallowing,Tooth/dental problems,Sore throat,  No sneezing, itching, ear ache, nasal congestion, post nasal drip,  Cardio-vascular:  No chest pain, Orthopnea, PND, swelling in lower extremities, anasarca, dizziness, palpitations  GI:  No heartburn, indigestion, abdominal pain, nausea, vomiting, diarrhea, change in bowel habits, loss of appetite  Resp:  No shortness of breath with exertion or at rest. No excess mucus, no productive cough, No non-productive cough, No coughing up of blood.No change in color of mucus.No wheezing.No chest wall deformity  Skin:  no rash or lesions.  GU:  no dysuria, change in color of urine, no urgency or frequency. No flank pain.  Musculoskeletal:  No  joint pain or swelling. No decreased range of motion. No back pain. Positive for left thigh pain  Psych:  No change in mood or affect. No depression or anxiety. No memory loss.   Past Medical History  Diagnosis Date  . Diabetes mellitus without complication    History reviewed. No pertinent past surgical history. Social History:  reports that he has never smoked. He has never used smokeless tobacco. He reports that he does not drink alcohol. His drug history is not on file.  No Known Allergies  History reviewed. No pertinent family history.   Prior to Admission medications   Medication Sig Start Date End Date Taking? Authorizing Provider  ACETAMINOPHEN-BUTALBITAL (BUPAP) 50-650 MG TABS Take 1 tablet by mouth 2 (two) times daily as needed.      Historical Provider, MD  esomeprazole (NEXIUM) 40 MG capsule Take 1 capsule (40 mg total) by mouth daily before breakfast. 08/14/12   Thurnell Lose, MD  glucose monitoring kit (FREESTYLE) monitoring kit 1 each by Does not apply route 4 (four) times daily - after meals and at bedtime. 1 month Diabetic Testing Supplies for QAC-QHS accuchecks. 08/14/12   Thurnell Lose, MD  insulin glargine (LANTUS) 100 UNIT/ML injection Inject 15 Units into the skin at bedtime. 08/14/12   Thurnell Lose, MD  metFORMIN (GLUCOPHAGE) 500 MG tablet Take 1 tablet (500 mg total) by mouth 2 (two) times daily with a meal. 08/14/12   Thurnell Lose, MD   Physical Exam: Filed Vitals:   04/26/14 1643 04/26/14 1700 04/26/14 1715 04/26/14 1746  BP: 125/58 125/62 123/63 129/67  Pulse: 67 61 70 82  Temp:  TempSrc:      Resp: 20 19 19 24   SpO2: 99% 100% 99% 100%    Wt Readings from Last 3 Encounters:  03/10/11 69.355 kg (152 lb 14.4 oz)    General:  Appears calm and comfortable, ill-appearing, no acute distress Eyes: PERRL, normal lids, irises & conjunctiva ENT: grossly normal hearing, lips & tongue Neck: no LAD, masses or thyromegaly Cardiovascular: RRR, no m/r/g.  No LE edema. Telemetry: SR, no arrhythmias  Respiratory: CTA bilaterally, no w/r/r. Normal respiratory effort. Abdomen: soft, Shawn Patton having mild generalized tenderness to palpation, no rebound tenderness or guarding Skin: hyperpigmentation to bilateral lower extremities Musculoskeletal: grossly normal tone BUE/BLE Psychiatric: grossly normal mood and affect, speech fluent and appropriate Neurologic: grossly non-focal. Has decreased in sedation to bilateral lower extremities          Labs on Admission:  Basic Metabolic Panel:  Recent Labs Lab 04/26/14 1516  NA 133*  K 4.5  CL 95*  CO2 18*  GLUCOSE 475*  BUN 18  CREATININE 0.66  CALCIUM 9.3   Liver Function Tests:  Recent Labs Lab 04/26/14 1516  AST 19  ALT 23  ALKPHOS 112  BILITOT 1.1  PROT 7.5  ALBUMIN 4.0   No results for input(s): LIPASE, AMYLASE in the last 168 hours. No results for input(s): AMMONIA in the last 168 hours. CBC:  Recent Labs Lab 04/26/14 1516  WBC 4.2  HGB 14.6  HCT 41.7  MCV 87.4  PLT 126*   Cardiac Enzymes: No results for input(s): CKTOTAL, CKMB, CKMBINDEX, TROPONINI in the last 168 hours.  BNP (last 3 results) No results for input(s): PROBNP in the last 8760 hours. CBG:  Recent Labs Lab 04/26/14 1504  GLUCAP 512*    Radiological Exams on Admission: No results found.   EKG: Independently reviewed.   Assessment/Plan Principal Problem:   DKA (diabetic ketoacidoses) Active Problems:   Hyponatremia   Nausea & vomiting   Abdominal pain   1. Diabetic ketoacidosis. Shawn Patton is a pleasant 58 year old gentleman with a past medical history of insulin-dependent diabetes mellitus, has not taken insulin over the past year due to financial hardships, doing his best to control blood sugars through diet and exercise alone. He presents in DKA having an anion gap of 20, with ABG showing a pH of 7.275. Will place Shawn Patton on the DKA protocol, provide IV insulin, IV fluids, replace  potassium, admit to step down unit for close monitoring. Will cycle serial BMP's overnight. Follow DKA protocol. 2. Hyponatremia. Likely secondary to diabetic ketoacidosis as lab work showing sodium of 133. Will provide IV fluid resuscitation. 3. Nausea/vomiting/abdominal pain. Likely secondary to presence of diabetic ketoacidosis. Lab work in the emergency room showing normal liver enzymes. Shawn Patton is afebrile, has white count of 4200. On exam did not appear to have acute abdomen. 4. Social issues. Shawn Patton will require assistance with prescriptions upon discharge. Social work consult placed.   Code Status: Full Code Family Communication: Spoke to his son at bedsided Disposition Plan: Anticipate Shawn Patton will require greater than 2 nights hospitalization, admit to inpatient service.  Time spent: 65 min  Kelvin Cellar [facility] Pager [phone]

## 2014-04-27 DIAGNOSIS — E1310 Other specified diabetes mellitus with ketoacidosis without coma: Principal | ICD-10-CM

## 2014-04-27 DIAGNOSIS — E876 Hypokalemia: Secondary | ICD-10-CM

## 2014-04-27 DIAGNOSIS — R7989 Other specified abnormal findings of blood chemistry: Secondary | ICD-10-CM | POA: Diagnosis present

## 2014-04-27 LAB — HEMOGLOBIN A1C
Hgb A1c MFr Bld: 15.4 % — ABNORMAL HIGH (ref <5.7)
Mean Plasma Glucose: 395 mg/dL — ABNORMAL HIGH (ref <117)

## 2014-04-27 LAB — LIPID PANEL
CHOL/HDL RATIO: 2.6 ratio
Cholesterol: 89 mg/dL (ref 0–200)
HDL: 34 mg/dL — ABNORMAL LOW (ref >39)
LDL Cholesterol: 38 mg/dL (ref 0–99)
Triglycerides: 83 mg/dL (ref <150)
VLDL: 17 mg/dL (ref 0–40)

## 2014-04-27 LAB — CBC
HEMATOCRIT: 37.1 % — AB (ref 39.0–52.0)
HEMOGLOBIN: 13.1 g/dL (ref 13.0–17.0)
MCH: 30.8 pg (ref 26.0–34.0)
MCHC: 35.3 g/dL (ref 30.0–36.0)
MCV: 87.3 fL (ref 78.0–100.0)
Platelets: 113 10*3/uL — ABNORMAL LOW (ref 150–400)
RBC: 4.25 MIL/uL (ref 4.22–5.81)
RDW: 12.1 % (ref 11.5–15.5)
WBC: 5.1 10*3/uL (ref 4.0–10.5)

## 2014-04-27 LAB — BASIC METABOLIC PANEL
ANION GAP: 12 (ref 5–15)
ANION GAP: 13 (ref 5–15)
BUN: 10 mg/dL (ref 6–23)
BUN: 9 mg/dL (ref 6–23)
CO2: 19 mEq/L (ref 19–32)
CO2: 19 mEq/L (ref 19–32)
Calcium: 8.4 mg/dL (ref 8.4–10.5)
Calcium: 8.5 mg/dL (ref 8.4–10.5)
Chloride: 110 mEq/L (ref 96–112)
Chloride: 110 mEq/L (ref 96–112)
Creatinine, Ser: 0.52 mg/dL (ref 0.50–1.35)
Creatinine, Ser: 0.55 mg/dL (ref 0.50–1.35)
GFR calc non Af Amer: >90 mL/min (ref >90)
Glucose, Bld: 167 mg/dL — ABNORMAL HIGH (ref 70–99)
Glucose, Bld: 113 mg/dL — ABNORMAL HIGH (ref 70–99)
POTASSIUM: 3.6 meq/L — AB (ref 3.7–5.3)
Potassium: 3.6 mEq/L — ABNORMAL LOW (ref 3.7–5.3)
Sodium: 141 mEq/L (ref 137–147)
Sodium: 142 mEq/L (ref 137–147)

## 2014-04-27 LAB — GLUCOSE, CAPILLARY
GLUCOSE-CAPILLARY: 139 mg/dL — AB (ref 70–99)
GLUCOSE-CAPILLARY: 128 mg/dL — AB (ref 70–99)
GLUCOSE-CAPILLARY: 185 mg/dL — AB (ref 70–99)
GLUCOSE-CAPILLARY: 335 mg/dL — AB (ref 70–99)
Glucose-Capillary: 171 mg/dL — ABNORMAL HIGH (ref 70–99)
Glucose-Capillary: 201 mg/dL — ABNORMAL HIGH (ref 70–99)
Glucose-Capillary: 249 mg/dL — ABNORMAL HIGH (ref 70–99)
Glucose-Capillary: 269 mg/dL — ABNORMAL HIGH (ref 70–99)
Glucose-Capillary: 334 mg/dL — ABNORMAL HIGH (ref 70–99)
Glucose-Capillary: 109 mg/dL — ABNORMAL HIGH (ref 70–99)
Glucose-Capillary: 369 mg/dL — ABNORMAL HIGH (ref 70–99)
Glucose-Capillary: 217 mg/dL — ABNORMAL HIGH (ref 70–99)
Glucose-Capillary: 239 mg/dL — ABNORMAL HIGH (ref 70–99)

## 2014-04-27 LAB — T4, FREE: Free T4: 2.28 ng/dL — ABNORMAL HIGH (ref 0.80–1.80)

## 2014-04-27 LAB — T3, FREE: T3, Free: 5.2 pg/mL — ABNORMAL HIGH (ref 2.3–4.2)

## 2014-04-27 MED ORDER — SODIUM CHLORIDE 0.9 % IJ SOLN
3.0000 mL | Freq: Two times a day (BID) | INTRAMUSCULAR | Status: DC
  Administered 2014-04-27 (×2): 3 mL via INTRAVENOUS

## 2014-04-27 MED ORDER — INSULIN GLARGINE 100 UNIT/ML ~~LOC~~ SOLN
10.0000 [IU] | Freq: Every day | SUBCUTANEOUS | Status: DC
  Administered 2014-04-27: 10 [IU] via SUBCUTANEOUS
  Filled 2014-04-27 (×2): qty 0.1

## 2014-04-27 MED ORDER — INSULIN ASPART 100 UNIT/ML ~~LOC~~ SOLN: 0.0000 [IU] | Freq: Every day | SUBCUTANEOUS | Status: DC

## 2014-04-27 MED ORDER — INSULIN ASPART PROT & ASPART (70-30 MIX) 100 UNIT/ML ~~LOC~~ SUSP
15.0000 [IU] | Freq: Every day | SUBCUTANEOUS | Status: DC
  Administered 2014-04-27 – 2014-04-28 (×2): 15 [IU] via SUBCUTANEOUS
  Filled 2014-04-27 (×2): qty 10

## 2014-04-27 MED ORDER — INFLUENZA VAC SPLIT QUAD 0.5 ML IM SUSY
0.5000 mL | PREFILLED_SYRINGE | INTRAMUSCULAR | Status: AC
  Administered 2014-04-28: 0.5 mL via INTRAMUSCULAR
  Filled 2014-04-27: qty 0.5

## 2014-04-27 MED ORDER — INSULIN ASPART 100 UNIT/ML ~~LOC~~ SOLN
0.0000 [IU] | Freq: Three times a day (TID) | SUBCUTANEOUS | Status: DC
  Administered 2014-04-27: 2 [IU] via SUBCUTANEOUS

## 2014-04-27 MED ORDER — POTASSIUM CHLORIDE CRYS ER 20 MEQ PO TBCR
40.0000 meq | EXTENDED_RELEASE_TABLET | ORAL | Status: AC
  Administered 2014-04-27 (×2): 40 meq via ORAL
  Filled 2014-04-27 (×2): qty 2

## 2014-04-27 MED ORDER — SODIUM CHLORIDE 0.9 % IV SOLN: 250.0000 mL | INTRAVENOUS | Status: DC | PRN

## 2014-04-27 MED ORDER — INSULIN ASPART PROT & ASPART (70-30 MIX) 100 UNIT/ML ~~LOC~~ SUSP
10.0000 [IU] | Freq: Every day | SUBCUTANEOUS | Status: DC
  Administered 2014-04-27: 10 [IU] via SUBCUTANEOUS
  Filled 2014-04-27: qty 10

## 2014-04-27 MED ORDER — PNEUMOCOCCAL VAC POLYVALENT 25 MCG/0.5ML IJ INJ
0.5000 mL | INJECTION | INTRAMUSCULAR | Status: AC
  Administered 2014-04-28: 0.5 mL via INTRAMUSCULAR
  Filled 2014-04-27: qty 0.5

## 2014-04-27 MED ORDER — SODIUM CHLORIDE 0.9 % IJ SOLN: 3.0000 mL | INTRAMUSCULAR | Status: DC | PRN

## 2014-04-27 MED ORDER — INSULIN ASPART PROT & ASPART (70-30 MIX) 100 UNIT/ML ~~LOC~~ SUSP: 15.0000 [IU] | Freq: Every day | SUBCUTANEOUS | Status: DC

## 2014-04-27 MED ORDER — INSULIN STARTER KIT- SYRINGES (ENGLISH)
1.0000 | Freq: Once | Status: AC
  Administered 2014-04-27: 1
  Filled 2014-04-27: qty 1

## 2014-04-27 MED ORDER — SODIUM CHLORIDE 0.9 % IV SOLN
INTRAVENOUS | Status: DC
  Administered 2014-04-27: 1000 mL via INTRAVENOUS

## 2014-04-27 NOTE — Progress Notes (Signed)
Attempted to call report, nurse Chyrel MassonCierra is currently busy will return the call.

## 2014-04-27 NOTE — Progress Notes (Signed)
Inpatient Diabetes Program Recommendations  AACE/ADA: New Consensus Statement on Inpatient Glycemic Control (2013)  Target Ranges:  Prepandial:   less than 140 mg/dL      Peak postprandial:   less than 180 mg/dL (1-2 hours)      Critically ill patients:  140 - 180 mg/dL   Reason for Visit: Consult from MD regarding DKA admission secondary to financial problems affording insulin  Diabetes history: Insulin dependent diabetes  Outpatient Diabetes medications: None for approximately last year Current orders for Inpatient glycemic control: 70/30 15 units every am and 10 units every pm  Note:  Met with patient and son, [place], at bedside.  Patient agreed for son to interpret.  Son, Andria Frames, who has been responsible to patient's home care-  is not present.    Discussed implications of 01/02 insulin-- need to roll insulin bottle to mix, need to take injections at breakfast and supper, need to eat meals on a "schedule", and discussed signs and symptoms of hypoglycemia.  Used teach-back method and Bir has basic understanding.  Gave him handouts regarding generic 70/30 from [employer] and generic glucose meter from [employer].    Bir has some questions about meal planning.  Placed dietitian consult.  Ordered insulin starter kit.    Will ask another Diabetes Coordinator to follow-up tomorrow to address questions and see if Andria Frames is present. Thank you.  Kalilah Barua S. Marcelline Mates, RN, CNS, CDE Inpatient Diabetes Program, team pager [phone]

## 2014-04-27 NOTE — Progress Notes (Addendum)
Chart reviewed.   [facility] PROGRESS NOTE  Shawn MartinezDhan Patton MVH:[id number] DOB: 10/09/1955 DOA: 04/26/2014 PCP: Dorrene GermanAVBUERE,EDWIN A, MD  Assessment/Plan:  Principal Problem:   DKA (diabetic ketoacidoses), type 2 diabetic: resolved. Will change to 70/30 insulin due to cost. holds sliding scale for now so that we can adjust 70/30.  Transfer to the floor. Will get care management involved to assist with medications. Diabetic educator to assist.  Increase activity. Active Problems:   Hyponatremia: Corrected on admission actually was within normal limits.   Nausea & vomiting resolved. Tolerating a solid diet.  Secondary to DKA   Abdominal pain:  Resolved. Secondary to DKA. Discontinue proton pump inhibitor.   Abnormal TSH: Check free T4 free T3. no known history of thyroid disease.   Hypokalemia:  Replete by mouth.   Code Status:  full Family Communication:  Wife at bedside. Disposition Plan:  Home 24-48 hours if remains stable.   HPI/Subjective: via wife who speaks some English:  Feels much better. No further abdominal pain nausea vomiting. Feeling stronger. Tolerating a diet.  Objective: Filed Vitals:   04/27/14 0753  BP: 98/51  Pulse: 64  Temp: 98.1 F (36.7 C)  Resp: 17    Intake/Output Summary (Last 24 hours) at 04/27/14 0922 Last data filed at 04/27/14 0600  Gross per 24 hour  Intake 4062.88 ml  Output   1475 ml  Net 2587.88 ml   Filed Weights   04/26/14 1900 04/27/14 0450  Weight: 62.37 kg (137 lb 8 oz) 62.5 kg (137 lb 12.6 oz)    Exam:   General:  Comfortable appearing. Alert and appropriate.  HEENT: Moist mucous membranes  Cardiovascular: regular rate rhythm without murmurs gallops rubs  Respiratory: clear to auscultation bilaterally without wheezes rhonchi or rales  Abdomen: soft nontender nondistended  Ext: No clubbing Cyanosis or edema  Basic Metabolic Panel:  Recent Labs Lab 04/26/14 1516 04/26/14 2030 04/26/14 2230 04/27/14 0024  04/27/14 0251  NA 133* 137 138 141 142  K 4.5 3.8 3.6* 3.6* 3.6*  CL 95* 105 107 110 110  CO2 18* 19 18* 19 19  GLUCOSE 475* 276* 210* 167* 113*  BUN 18 13 12 10 9   CREATININE 0.66 0.59 0.59 0.52 0.55  CALCIUM 9.3 8.2* 8.2* 8.4 8.5   Liver Function Tests:  Recent Labs Lab 04/26/14 1516  AST 19  ALT 23  ALKPHOS 112  BILITOT 1.1  PROT 7.5  ALBUMIN 4.0   No results for input(s): LIPASE, AMYLASE in the last 168 hours. No results for input(s): AMMONIA in the last 168 hours. CBC:  Recent Labs Lab 04/26/14 1516 04/27/14 0251  WBC 4.2 5.1  HGB 14.6 13.1  HCT 41.7 37.1*  MCV 87.4 87.3  PLT 126* 113*   Cardiac Enzymes: No results for input(s): CKTOTAL, CKMB, CKMBINDEX, TROPONINI in the last 168 hours. BNP (last 3 results) No results for input(s): PROBNP in the last 8760 hours. CBG:  Recent Labs Lab 04/27/14 0003 04/27/14 0108 04/27/14 0153 04/27/14 0314 04/27/14 0756  GLUCAP 171* 139* 128* 109* 185*    Recent Results (from the past 240 hour(s))  MRSA PCR Screening     Status: None   Collection Time: 04/26/14  7:11 PM  Result Value Ref Range Status   MRSA by PCR NEGATIVE NEGATIVE Final    Comment:        The GeneXpert MRSA Assay (FDA approved for NASAL specimens only), is one component of a comprehensive MRSA colonization surveillance program. It is not intended  to diagnose MRSA infection nor to guide or monitor treatment for MRSA infections.      Studies: No results found.  Scheduled Meds: . heparin  5,000 Units Subcutaneous 3 times per day  . [START ON 04/28/2014] Influenza vac split quadrivalent PF  0.5 mL Intramuscular Tomorrow-1000  . insulin aspart  0-5 Units Subcutaneous QHS  . insulin aspart  0-9 Units Subcutaneous TID WC  . insulin glargine  10 Units Subcutaneous QHS  . pantoprazole  40 mg Oral Daily  . [START ON 04/28/2014] pneumococcal 23 valent vaccine  0.5 mL Intramuscular Tomorrow-1000  . potassium chloride  40 mEq Oral Q2H    Continuous Infusions: . sodium chloride 1,000 mL (04/27/14 0448)    Time spent: 35 minutes  Abad Manard L  [facility] Pager [phone]. If 7PM-7AM, please contact night-coverage at [email or web address], password [email or web address] 04/27/2014, 9:22 AM  LOS: 1 day

## 2014-04-27 NOTE — Progress Notes (Signed)
[facility] Patient transferred to [phone] via wheelchair. Attempted to call son no answer and voicemail not activated.

## 2014-04-27 NOTE — Progress Notes (Signed)
Pt and family concerned over cost of insulin. Son is only income for family and is unable to buy medications.

## 2014-04-27 NOTE — Progress Notes (Signed)
Utilization review completed. Alesia Oshields, RN, BSN. 

## 2014-04-27 NOTE — Clinical Social Work Note (Signed)
CSW contacted financial counseling to discuss patient's Medicaid Potential.  Financial counseling to see patient today (04/27/14) to assess for Medicaid- financial counseling informed CSW that patient will likely not be medicaid eligible.   CSW signing off for now.  Merlyn LotJenna Holoman, LCSWA Clinical Social Worker [phone]

## 2014-04-27 NOTE — Progress Notes (Signed)
Nutrition Brief Note  RD received consult for diabetes education. Per consult note and DM coordinator, pt family has questions regarding meal planning.   Pt asleep upon visit and no family members present. RD will reattempt education on 04/29/14 and try to meet with family members at that time.   Shawn Patton, RD, LDN Pager: [phone] After hours Pager: [phone]

## 2014-04-28 DIAGNOSIS — E1310 Other specified diabetes mellitus with ketoacidosis without coma: Secondary | ICD-10-CM | POA: Insufficient documentation

## 2014-04-28 LAB — BASIC METABOLIC PANEL
ANION GAP: 13 (ref 5–15)
BUN: 10 mg/dL (ref 6–23)
CHLORIDE: 103 meq/L (ref 96–112)
CO2: 24 mEq/L (ref 19–32)
Calcium: 9.1 mg/dL (ref 8.4–10.5)
Creatinine, Ser: 0.57 mg/dL (ref 0.50–1.35)
GFR calc non Af Amer: >90 mL/min (ref >90)
Glucose, Bld: 179 mg/dL — ABNORMAL HIGH (ref 70–99)
POTASSIUM: 3.3 meq/L — AB (ref 3.7–5.3)
Sodium: 140 mEq/L (ref 137–147)

## 2014-04-28 LAB — GLUCOSE, CAPILLARY
GLUCOSE-CAPILLARY: 167 mg/dL — AB (ref 70–99)
Glucose-Capillary: 285 mg/dL — ABNORMAL HIGH (ref 70–99)

## 2014-04-28 MED ORDER — POTASSIUM CHLORIDE CRYS ER 20 MEQ PO TBCR: 40.0000 meq | EXTENDED_RELEASE_TABLET | Freq: Once | ORAL | Status: DC

## 2014-04-28 MED ORDER — INSULIN NPH ISOPHANE & REGULAR (70-30) 100 UNIT/ML ~~LOC~~ SUSP: SUBCUTANEOUS | Status: DC

## 2014-04-28 MED ORDER — INSULIN ASPART PROT & ASPART (70-30 MIX) 100 UNIT/ML ~~LOC~~ SUSP: SUBCUTANEOUS | Status: DC

## 2014-04-28 MED ORDER — "INSULIN SYRINGE-NEEDLE U-100 29G X 5/16"" 1 ML MISC": Status: DC

## 2014-04-28 MED ORDER — BLOOD GLUCOSE METER KIT: PACK | Status: DC

## 2014-04-28 NOTE — Progress Notes (Signed)
Inpatient Diabetes Program Recommendations  AACE/ADA: New Consensus Statement on Inpatient Glycemic Control (2013)  Target Ranges:  Prepandial:   less than 140 mg/dL      Peak postprandial:   less than 180 mg/dL (1-2 hours)      Critically ill patients:  140 - 180 mg/dL    Results for Alfredo MartinezRAI, Demetries (MRN [id number]) as of 04/28/2014 15:20  Ref. Range 04/28/2014 07:34 04/28/2014 11:53  Glucose-Capillary Latest Range: 70-99 mg/dL 409167 (H) 811285 (H)     Spoke w/ pt's son.  Showed him how to draw up and give insulin with vial and syringe.  Discussed 70/30 insulin and how to take.  Reviewed storage of insulin and CBG goals.  Instructed pt's [facility]  Also discussed s/sxs of hypoglycemia and how to treat hypoglycemia at home.    Will follow Ambrose FinlandJeannine Johnston Joella Saefong RN, MSN, CDE Diabetes Coordinator Inpatient Diabetes Program Team Pager: [phone] (8a-10p)

## 2014-04-28 NOTE — Discharge Summary (Signed)
Physician Discharge Summary  Shawn Patton ZOX:[id number] DOB: November 24, 1955 DOA: 04/26/2014  PCP: Philis Fendt, MD  Admit date: 04/26/2014 Discharge date: 04/28/2014  Time spent: greater than 30 minutes  Recommendations for Outpatient Follow-up:  1. Repeat TSH, free T4 in 1-2 months 2. Optimize diabetes control  Discharge Diagnoses:  Principal Problem:   DKA (diabetic ketoacidoses) Active Problems:   Nausea & vomiting   Abdominal pain   Abnormal TSH   Hypokalemia   Discharge Condition: stable  Filed Weights   04/26/14 1900 04/27/14 0450  Weight: 62.37 kg (137 lb 8 oz) 62.5 kg (137 lb 12.6 oz)    History of present illness:  58 y.o. male with a past medical history of insulin-dependent diabetes mellitus who presents to the emergency room with complaints of abdominal pain, nausea, vomiting. History was obtained from patient's son who was present at bedside. [name] has felt ill for the past month having episodic nausea vomiting, abdominal pain worse over the last several days. He complains of associated generalized weakness, malaise, fatigue. His son reporting that he has not taken insulin for the past year due difficulties affording his medicine, and have been trying to manage his blood sugars with diet and exercise. His son reporting that his family rely's solely on his income and can not afford his parent's medications. In the emergency room he was found to have a blood sugar of 475 with anion gap of 20, pH of 7.275. He was started on IV insulin and IV fluids.  Hospital Course:  Admitted to stepdown.   Has type 2 diabetes.  Started on IVF, IV insulin. After anion gap corrected, transitioned to 70/30 bid (for cost reasons).  Symptoms resolved. Vital signs stable and tolerating a regular diet by discharge.  Care manager consulted to assist with medication needs and follow up   Hyponatremia: when corrected for hypoglycemia, normal   Nausea & vomiting resolved. Tolerating a solid diet.  Secondary to DKA   Abdominal pain: Resolved. Secondary to DKA. D   Abnormal thyroid function tests:  TSH low, free T4, free T3 high. Discussed with endocrinology. Recommend repeating in a few months to r/o euthyroid sick syndrome. No signs of thyrotoxicosis.   Hypokalemia: Replete IV and by mouth.  Procedures:  none  Consultations:  none  Discharge Exam: Filed Vitals:   04/28/14 0542  BP: 105/59  Pulse: 57  Temp: 99 F (37.2 C)  Resp: 16    General: alert and comfortable Cardiovascular: RRR Respiratory: CTA   Discharge Instructions    Activity as tolerated - No restrictions    Complete by:  As directed      Diet Carb Modified    Complete by:  As directed      Diet Carb Modified    Complete by:  As directed      Discharge instructions    Complete by:  As directed   Monitor blood glucose before lunch and dinner     Discharge instructions    Complete by:  As directed   Go to clinic to pick up insulin, needles, syringes, and meter          Current Discharge Medication List    START taking these medications   Details  Blood Glucose Monitoring Suppl (BLOOD GLUCOSE METER KIT AND SUPPLIES) Monitor blood glucose twice daily before breakfast and supper Qty: 1 each, Refills: 11    insulin aspart protamine- aspart (NOVOLOG MIX 70/30) (70-30) 100 UNIT/ML injection 18 units sq q am with breakfast.  12 units sq q evening with supper Qty: 10 mL, Refills: 11    Insulin Syringe-Needle U-100 29G X 5/16" 1 ML MISC As directed Qty: 100 each, Refills: 11      CONTINUE these medications which have NOT CHANGED   Details  ACETAMINOPHEN-BUTALBITAL (BUPAP) 50-650 MG TABS Take 1 tablet by mouth 2 (two) times daily as needed.      esomeprazole (NEXIUM) 40 MG capsule Take 1 capsule (40 mg total) by mouth daily before breakfast. Qty: 30 capsule, Refills: 2      STOP taking these medications     insulin glargine (LANTUS) 100 UNIT/ML injection      metFORMIN (GLUCOPHAGE)  500 MG tablet        No Known Allergies Follow-up Information    Follow up with [facility]     On 05/06/2014.   Why:  10:30 am for hospital follow up,    Contact information:   [address] [place] [address] [phone]       The results of significant diagnostics from this hospitalization (including imaging, microbiology, ancillary and laboratory) are listed below for reference.    Significant Diagnostic Studies: No results found.  Microbiology: Recent Results (from the past 240 hour(s))  MRSA PCR Screening     Status: None   Collection Time: 04/26/14  7:11 PM  Result Value Ref Range Status   MRSA by PCR NEGATIVE NEGATIVE Final    Comment:        The GeneXpert MRSA Assay (FDA approved for NASAL specimens only), is one component of a comprehensive MRSA colonization surveillance program. It is not intended to diagnose MRSA infection nor to guide or monitor treatment for MRSA infections.      Labs: Basic Metabolic Panel:  Recent Labs Lab 04/26/14 2030 04/26/14 2230 04/27/14 0024 04/27/14 0251 04/28/14 0628  NA 137 138 141 142 140  K 3.8 3.6* 3.6* 3.6* 3.3*  CL 105 107 110 110 103  CO2 19 18* 19 19 24   GLUCOSE 276* 210* 167* 113* 179*  BUN 13 12 10 9 10   CREATININE 0.59 0.59 0.52 0.55 0.57  CALCIUM 8.2* 8.2* 8.4 8.5 9.1   Liver Function Tests:  Recent Labs Lab 04/26/14 1516  AST 19  ALT 23  ALKPHOS 112  BILITOT 1.1  PROT 7.5  ALBUMIN 4.0   No results for input(s): LIPASE, AMYLASE in the last 168 hours. No results for input(s): AMMONIA in the last 168 hours. CBC:  Recent Labs Lab 04/26/14 1516 04/27/14 0251  WBC 4.2 5.1  HGB 14.6 13.1  HCT 41.7 37.1*  MCV 87.4 87.3  PLT 126* 113*   Cardiac Enzymes: No results for input(s): CKTOTAL, CKMB, CKMBINDEX, TROPONINI in the last 168 hours. BNP: BNP (last 3 results) No results for input(s): PROBNP in the last 8760 hours. CBG:  Recent  Labs Lab 04/27/14 1302 04/27/14 1642 04/27/14 2216 04/28/14 0734 04/28/14 1153  GLUCAP 217* 335* 239* 167* 285*       Signed:  Carrisa Keller L  [facility] 04/28/2014, 12:46 PM

## 2014-04-28 NOTE — Progress Notes (Signed)
Shawn Patton to be D/C'd Home per MD order.  Discussed with the patient and son who speaks english and all questions fully answered. Patient lacks education and understanding to give discharge instructions to him. He lacked the skills to give his own insulin at home.     Medication List    STOP taking these medications        insulin glargine 100 UNIT/ML injection  Commonly known as:  LANTUS     metFORMIN 500 MG tablet  Commonly known as:  GLUCOPHAGE      TAKE these medications        blood glucose meter kit and supplies  Monitor blood glucose twice daily before breakfast and supper     BUPAP 50-650 MG Tabs  Generic drug:  ACETAMINOPHEN-BUTALBITAL  Take 1 tablet by mouth 2 (two) times daily as needed.     esomeprazole 40 MG capsule  Commonly known as:  NEXIUM  Take 1 capsule (40 mg total) by mouth daily before breakfast.     insulin aspart protamine- aspart (70-30) 100 UNIT/ML injection  Commonly known as:  NOVOLOG MIX 70/30  18 units sq q am with breakfast. 12 units sq q evening with supper     Insulin Syringe-Needle U-100 29G X 5/16" 1 ML Misc  As directed        VVS, Skin clean, dry and intact without evidence of skin break down, no evidence of skin tears noted. IV catheter discontinued intact. Site without signs and symptoms of complications. Dressing and pressure applied.  An After Visit Summary was printed and given to the patient and son.   D/c education completed with son including follow up instructions, medication list, d/c activities limitations if indicated, with other d/c instructions as indicated by MD - patient able to verbalize understanding, all questions fully answered.   Patient instructed to return to ED, call 911, or call MD for any changes in condition.   Patient escorted via [place], and D/C home via private auto.  Shawn Patton D 04/28/2014 2:50 PM

## 2014-05-06 ENCOUNTER — Ambulatory Visit: Payer: MEDICAID | Attending: Family Medicine | Admitting: Family Medicine

## 2014-05-06 ENCOUNTER — Encounter: Payer: Self-pay | Admitting: Family Medicine

## 2014-05-06 VITALS — BP 100/57 | HR 56 | Temp 98.1°F | Resp 16 | Ht 65.0 in | Wt 140.0 lb

## 2014-05-06 DIAGNOSIS — Z794 Long term (current) use of insulin: Secondary | ICD-10-CM | POA: Insufficient documentation

## 2014-05-06 DIAGNOSIS — E1142 Type 2 diabetes mellitus with diabetic polyneuropathy: Secondary | ICD-10-CM | POA: Insufficient documentation

## 2014-05-06 DIAGNOSIS — E119 Type 2 diabetes mellitus without complications: Secondary | ICD-10-CM | POA: Insufficient documentation

## 2014-05-06 DIAGNOSIS — E1310 Other specified diabetes mellitus with ketoacidosis without coma: Secondary | ICD-10-CM

## 2014-05-06 DIAGNOSIS — E1110 Type 2 diabetes mellitus with ketoacidosis without coma: Secondary | ICD-10-CM

## 2014-05-06 DIAGNOSIS — R739 Hyperglycemia, unspecified: Secondary | ICD-10-CM

## 2014-05-06 LAB — BASIC METABOLIC PANEL
BUN: 9 mg/dL (ref 6–23)
CALCIUM: 9 mg/dL (ref 8.4–10.5)
CHLORIDE: 104 meq/L (ref 96–112)
CO2: 28 meq/L (ref 19–32)
CREATININE: 0.78 mg/dL (ref 0.50–1.35)
GLUCOSE: 297 mg/dL — AB (ref 70–99)
Potassium: 4.6 mEq/L (ref 3.5–5.3)
Sodium: 138 mEq/L (ref 135–145)

## 2014-05-06 LAB — POCT URINALYSIS DIPSTICK
Bilirubin, UA: NEGATIVE
Glucose, UA: 500
Ketones, UA: NEGATIVE
LEUKOCYTES UA: NEGATIVE
NITRITE UA: NEGATIVE
PH UA: 6.5
PROTEIN UA: NEGATIVE
RBC UA: NEGATIVE
Spec Grav, UA: <=1.005
Urobilinogen, UA: 0.2

## 2014-05-06 LAB — GLUCOSE, POCT (MANUAL RESULT ENTRY)
POC GLUCOSE: 448 mg/dL — AB (ref 70–99)
POC Glucose: 337 mg/dl — AB (ref 70–99)

## 2014-05-06 MED ORDER — SODIUM CHLORIDE 0.9 % IV SOLN: Freq: Once | INTRAVENOUS | Status: DC

## 2014-05-06 MED ORDER — GABAPENTIN 300 MG PO CAPS: 300.0000 mg | ORAL_CAPSULE | Freq: Every day | ORAL | Status: DC

## 2014-05-06 MED ORDER — INSULIN ASPART PROT & ASPART (70-30 MIX) 100 UNIT/ML ~~LOC~~ SUSP: 20.0000 [IU] | Freq: Two times a day (BID) | SUBCUTANEOUS | Status: DC

## 2014-05-06 MED ORDER — METFORMIN HCL ER 500 MG PO TB24: 1000.0000 mg | ORAL_TABLET | Freq: Every day | ORAL | Status: DC

## 2014-05-06 MED ORDER — INSULIN ASPART PROT & ASPART (70-30 MIX) 100 UNIT/ML ~~LOC~~ SUSP
10.0000 [IU] | Freq: Once | SUBCUTANEOUS | Status: AC
  Administered 2014-05-06: 10 [IU] via SUBCUTANEOUS

## 2014-05-06 NOTE — Assessment & Plan Note (Addendum)
A: elevated CBGs persist P:  Increase 70/30 to 20 Units twice daily after meals. Add metformin 1000 mg XR after supper.  For burning sensation add gabapentin 300 mg before bed. Urine microalbumin

## 2014-05-06 NOTE — Assessment & Plan Note (Signed)
Resolved

## 2014-05-06 NOTE — Patient Instructions (Addendum)
Mr. Shawn Patton,  Thank you for coming in today. It was a pleasure meeting you. I look forward to being your primary doctor.   You blood sugar has come down with insulin and IV fluids.  From 448 to 337.   Plan: Increase 70/30 to 20 Units twice daily after meals. Add metformin 1000 mg XR after supper.  For burning sensation add gabapentin 300 mg before bed.  Write down sugars.  Close f/u in 3 weeks with nurse for blood sugar check. F/u in 6 weeks with me   Dr. Armen PickupFunches

## 2014-05-06 NOTE — Progress Notes (Signed)
Establish Care HFU DM Stated glucose still running high between 300-400 Complaining of blurry vision.

## 2014-05-06 NOTE — Progress Notes (Signed)
   Subjective:    Patient ID: Shawn MartinezDhan Patton, male    DOB: 09/16/1955, 58 y.o.   MRN: [id number] CC: HFU for DKA, blurred vision  HPI 58 yo M presents to establish care:  1. DKA: no confusion. Still with some nausea and chest discomfort. No emesis, fever or dysuria. Taking insulin and checking CBGs range is 200-300. Has polyphagia. Eating a lot of fruit. Has burning sensation in back, R hand and L leg.   Soc Hx: chronic non smoker  Med Hx: DM2 dx in 2012 Surg Hx: stomach surgery   Review of Systems As per HPI     Objective:   Physical Exam BP 100/57 mmHg  Pulse 56  Temp(Src) 98.1 F (36.7 C) (Oral)  Resp 16  Ht 5\' 5"  (1.651 m)  Wt 140 lb (63.504 kg)  BMI 23.30 kg/m2  SpO2 100%  Wt Readings from Last 3 Encounters:  05/06/14 140 lb (63.504 kg)  04/27/14 137 lb 12.6 oz (62.5 kg)  03/10/11 152 lb 14.4 oz (69.355 kg)  General appearance: alert, cooperative and no distress Lungs: clear to auscultation bilaterally Heart: regular rate and rhythm, S1, S2 normal, no murmur, click, rub or gallop Abdomen: soft, non-tender; bowel sounds normal; no masses,  no organomegaly Extremities: extremities normal, atraumatic, no cyanosis or edema  Groin: circumcised, no urethral discharge, no groin rash  Skin: hyperpigmented macules on lower legs b/l, otherwise no rash or lesions. Feet: no lesions   CBG 448 UA: 500 glucose, negative ketones. Lab Results  Component Value Date   HGBA1C 15.4* 04/26/2014   Patient given 10 U novolog and 1000 cc NS.   Repeat CBG 337     Assessment & Plan:

## 2014-05-07 LAB — MICROALBUMIN / CREATININE URINE RATIO
Creatinine, Urine: 40.8 mg/dL
Microalb, Ur: <0.2 mg/dL (ref <2.0)

## 2014-09-10 ENCOUNTER — Ambulatory Visit: Payer: Self-pay | Attending: Family Medicine | Admitting: Family Medicine

## 2014-09-10 ENCOUNTER — Encounter: Payer: Self-pay | Admitting: Family Medicine

## 2014-09-10 VITALS — BP 132/80 | HR 58 | Temp 98.4°F | Resp 18 | Ht 64.0 in | Wt 155.0 lb

## 2014-09-10 DIAGNOSIS — M6740 Ganglion, unspecified site: Secondary | ICD-10-CM

## 2014-09-10 DIAGNOSIS — M1990 Unspecified osteoarthritis, unspecified site: Secondary | ICD-10-CM

## 2014-09-10 DIAGNOSIS — R7989 Other specified abnormal findings of blood chemistry: Secondary | ICD-10-CM

## 2014-09-10 DIAGNOSIS — M19049 Primary osteoarthritis, unspecified hand: Secondary | ICD-10-CM | POA: Insufficient documentation

## 2014-09-10 DIAGNOSIS — Z114 Encounter for screening for human immunodeficiency virus [HIV]: Secondary | ICD-10-CM

## 2014-09-10 DIAGNOSIS — M67432 Ganglion, left wrist: Secondary | ICD-10-CM | POA: Insufficient documentation

## 2014-09-10 DIAGNOSIS — E1140 Type 2 diabetes mellitus with diabetic neuropathy, unspecified: Secondary | ICD-10-CM | POA: Insufficient documentation

## 2014-09-10 DIAGNOSIS — E1142 Type 2 diabetes mellitus with diabetic polyneuropathy: Secondary | ICD-10-CM

## 2014-09-10 LAB — GLUCOSE, POCT (MANUAL RESULT ENTRY): POC Glucose: 141 mg/dl — AB (ref 70–99)

## 2014-09-10 LAB — HIV ANTIBODY (ROUTINE TESTING W REFLEX): HIV 1&2 Ab, 4th Generation: NONREACTIVE

## 2014-09-10 LAB — POCT GLYCOSYLATED HEMOGLOBIN (HGB A1C): HEMOGLOBIN A1C: 5.9

## 2014-09-10 MED ORDER — ACETAMINOPHEN ER 650 MG PO TBCR: 650.0000 mg | EXTENDED_RELEASE_TABLET | Freq: Three times a day (TID) | ORAL | Status: DC | PRN

## 2014-09-10 MED ORDER — GABAPENTIN 300 MG PO CAPS: 600.0000 mg | ORAL_CAPSULE | Freq: Every day | ORAL | Status: DC

## 2014-09-10 MED ORDER — INSULIN ASPART PROT & ASPART (70-30 MIX) 100 UNIT/ML ~~LOC~~ SUSP: 15.0000 [IU] | Freq: Two times a day (BID) | SUBCUTANEOUS | Status: DC

## 2014-09-10 MED ORDER — METFORMIN HCL ER 500 MG PO TB24: 1000.0000 mg | ORAL_TABLET | Freq: Every day | ORAL | Status: DC

## 2014-09-10 NOTE — Assessment & Plan Note (Signed)
A; R radial wrist  P:

## 2014-09-10 NOTE — Progress Notes (Signed)
[facility] Interpret[facility] [place] # [phone]  Cyst on rt hand Complaining on pain on hand and chest x 64month  Abdominal pain

## 2014-09-10 NOTE — Patient Instructions (Addendum)
Mr. Shawn Patton,  1. Diabetes:  A1c is great now. Well done!  Goal A1c  <7, at goal! Goal fasting 90-110 Goal after meal < 160 No low sugars  (< 70)  Decrease 70/30 to 15 U BID Continue metformin 1000 mg XR nightly    2. Diabetic neuropathy:  Gabapentin 300 mg nightly for one week, then 600 mg nightly   3. Arthritis pain in hand Tylenol 650 mg three times daily as needed Tart cherry- juice or capsules   F/u in 6 weeks with nurse for blood sugar review  F/u with me in 3 months

## 2014-09-10 NOTE — Progress Notes (Signed)
   Subjective:    Patient ID: Alfredo MartinezDhan Husted, male    DOB: 05/24/1956, 59 y.o.   MRN: [id number]  HPI  1.. CHRONIC DIABETES  Disease Monitoring  Blood Sugar Ranges: within normal level   Polyuria: no   Visual problems: no   Numbness: in feet and hands   Medication Compliance: yes  Medication Side Effects  Hypoglycemia: no   Preventitive Health Care  Eye Exam: due   Foot Exam: done today     2. Wrist pain: has pain in wrist with csyt on radial aspect of R wrist. No taking anything for pain. No trauma.   Soc Hx: non smoker  Review of Systems  Constitutional: Negative for fever.  Respiratory: Negative for shortness of breath.   Cardiovascular: Negative for chest pain.  Endocrine: Negative.        Objective:   Physical Exam BP 132/80 mmHg  Pulse 58  Temp(Src) 98.4 F (36.9 C) (Oral)  Resp 18  Ht 5\' 4"  (1.626 m)  Wt 155 lb (70.308 kg)  BMI 26.59 kg/m2  SpO2 97% General appearance: alert, cooperative and no distress Lungs: clear to auscultation bilaterally Heart: regular rate and rhythm, S1, S2 normal, no murmur, click, rub or gallop Extremities: extremities normal, atraumatic, no cyanosis or edema  R wrist: cystic area R radial wrist, mildly tender, 2 + radial pulse   Lab Results  Component Value Date   HGBA1C 5.90 09/10/2014        Assessment & Plan:

## 2014-09-14 NOTE — Assessment & Plan Note (Signed)
Diabetes:  A1c is great now. Well done!  Goal A1c  <7, at goal! Goal fasting 90-110 Goal after meal < 160 No low sugars  (< 70)  Decrease 70/30 to 15 U BID Continue metformin 1000 mg XR nightly

## 2014-09-14 NOTE — Assessment & Plan Note (Signed)
Arthritis pain in hand Tylenol 650 mg three times daily as needed Tart cherry- juice or capsules

## 2014-09-14 NOTE — Assessment & Plan Note (Signed)
Diabetic neuropathy:  Gabapentin 300 mg nightly for one week, then 600 mg nightly

## 2014-09-17 ENCOUNTER — Ambulatory Visit: Payer: Self-pay | Attending: Internal Medicine

## 2014-09-22 ENCOUNTER — Other Ambulatory Visit: Payer: Self-pay | Admitting: *Deleted

## 2014-09-22 DIAGNOSIS — E1142 Type 2 diabetes mellitus with diabetic polyneuropathy: Secondary | ICD-10-CM

## 2014-09-22 MED ORDER — INSULIN ASPART PROT & ASPART (70-30 MIX) 100 UNIT/ML ~~LOC~~ SUSP: 15.0000 [IU] | Freq: Two times a day (BID) | SUBCUTANEOUS | Status: DC

## 2014-10-19 ENCOUNTER — Other Ambulatory Visit: Payer: Self-pay

## 2014-10-19 ENCOUNTER — Other Ambulatory Visit (HOSPITAL_COMMUNITY): Payer: Self-pay

## 2014-10-19 ENCOUNTER — Telehealth: Payer: Self-pay | Admitting: *Deleted

## 2014-10-19 ENCOUNTER — Ambulatory Visit: Payer: Self-pay | Attending: Family Medicine | Admitting: Family Medicine

## 2014-10-19 ENCOUNTER — Ambulatory Visit (HOSPITAL_COMMUNITY)
Admission: RE | Admit: 2014-10-19 | Discharge: 2014-10-19 | Disposition: A | Discharge status: Home / Self Care | Payer: Medicaid Other | Source: Ambulatory Visit | Attending: Family Medicine | Admitting: Family Medicine

## 2014-10-19 ENCOUNTER — Inpatient Hospital Stay (HOSPITAL_COMMUNITY)
Admission: EM | Admit: 2014-10-19 | Discharge: 2014-10-22 | DRG: 340 | Disposition: A | Discharge status: Home / Self Care | Payer: Medicaid Other | Attending: General Surgery | Admitting: General Surgery

## 2014-10-19 ENCOUNTER — Encounter (HOSPITAL_COMMUNITY): Payer: Self-pay | Admitting: Emergency Medicine

## 2014-10-19 VITALS — BP 133/87 | HR 95 | Temp 98.1°F | Wt 153.4 lb

## 2014-10-19 DIAGNOSIS — R1030 Lower abdominal pain, unspecified: Secondary | ICD-10-CM

## 2014-10-19 DIAGNOSIS — K3533 Acute appendicitis with perforation and localized peritonitis, with abscess: Secondary | ICD-10-CM

## 2014-10-19 DIAGNOSIS — E119 Type 2 diabetes mellitus without complications: Secondary | ICD-10-CM | POA: Diagnosis present

## 2014-10-19 DIAGNOSIS — K3532 Acute appendicitis with perforation and localized peritonitis, without abscess: Secondary | ICD-10-CM | POA: Diagnosis present

## 2014-10-19 DIAGNOSIS — K7689 Other specified diseases of liver: Secondary | ICD-10-CM | POA: Insufficient documentation

## 2014-10-19 DIAGNOSIS — R935 Abnormal findings on diagnostic imaging of other abdominal regions, including retroperitoneum: Secondary | ICD-10-CM | POA: Insufficient documentation

## 2014-10-19 DIAGNOSIS — K353 Acute appendicitis with localized peritonitis: Principal | ICD-10-CM | POA: Diagnosis present

## 2014-10-19 DIAGNOSIS — N400 Enlarged prostate without lower urinary tract symptoms: Secondary | ICD-10-CM | POA: Insufficient documentation

## 2014-10-19 DIAGNOSIS — K760 Fatty (change of) liver, not elsewhere classified: Secondary | ICD-10-CM | POA: Insufficient documentation

## 2014-10-19 DIAGNOSIS — R1031 Right lower quadrant pain: Secondary | ICD-10-CM | POA: Insufficient documentation

## 2014-10-19 LAB — CBC WITH DIFFERENTIAL/PLATELET
Basophils Absolute: 0 10*3/uL (ref 0.0–0.1)
Basophils Relative: 0 % (ref 0–1)
EOS ABS: 0 10*3/uL (ref 0.0–0.7)
EOS PCT: 1 % (ref 0–5)
HEMATOCRIT: 42.2 % (ref 39.0–52.0)
HEMOGLOBIN: 14.8 g/dL (ref 13.0–17.0)
Lymphocytes Relative: 22 % (ref 12–46)
Lymphs Abs: 1.6 10*3/uL (ref 0.7–4.0)
MCH: 30.4 pg (ref 26.0–34.0)
MCHC: 35.1 g/dL (ref 30.0–36.0)
MCV: 86.7 fL (ref 78.0–100.0)
MONO ABS: 0.8 10*3/uL (ref 0.1–1.0)
MONOS PCT: 10 % (ref 3–12)
Neutro Abs: 4.9 10*3/uL (ref 1.7–7.7)
Neutrophils Relative %: 67 % (ref 43–77)
Platelets: 170 10*3/uL (ref 150–400)
RBC: 4.87 MIL/uL (ref 4.22–5.81)
RDW: 11.7 % (ref 11.5–15.5)
WBC: 7.3 10*3/uL (ref 4.0–10.5)

## 2014-10-19 LAB — COMPREHENSIVE METABOLIC PANEL
ALBUMIN: 3.9 g/dL (ref 3.5–5.0)
ALK PHOS: 77 U/L (ref 38–126)
ALT: 27 U/L (ref 17–63)
ANION GAP: 8 (ref 5–15)
AST: 23 U/L (ref 15–41)
BUN: 8 mg/dL (ref 6–20)
CO2: 26 mmol/L (ref 22–32)
Calcium: 9.1 mg/dL (ref 8.9–10.3)
Chloride: 102 mmol/L (ref 101–111)
Creatinine, Ser: 0.89 mg/dL (ref 0.61–1.24)
GFR calc Af Amer: >60 mL/min (ref >60)
GFR calc non Af Amer: >60 mL/min (ref >60)
Glucose, Bld: 106 mg/dL — ABNORMAL HIGH (ref 70–99)
POTASSIUM: 3.9 mmol/L (ref 3.5–5.1)
SODIUM: 136 mmol/L (ref 135–145)
TOTAL PROTEIN: 7.9 g/dL (ref 6.5–8.1)
Total Bilirubin: 1.2 mg/dL (ref 0.3–1.2)

## 2014-10-19 LAB — GLUCOSE, CAPILLARY: Glucose-Capillary: 113 mg/dL — ABNORMAL HIGH (ref 70–99)

## 2014-10-19 LAB — SAMPLE TO BLOOD BANK

## 2014-10-19 LAB — POCT I-STAT CREATININE: CREATININE: 0.8 mg/dL (ref 0.61–1.24)

## 2014-10-19 MED ORDER — KCL IN DEXTROSE-NACL 20-5-0.45 MEQ/L-%-% IV SOLN
INTRAVENOUS | Status: DC
  Administered 2014-10-19 – 2014-10-21 (×4): via INTRAVENOUS
  Filled 2014-10-19 (×7): qty 1000

## 2014-10-19 MED ORDER — PIPERACILLIN-TAZOBACTAM 3.375 G IVPB
3.3750 g | Freq: Three times a day (TID) | INTRAVENOUS | Status: DC
  Administered 2014-10-19 – 2014-10-22 (×8): 3.375 g via INTRAVENOUS
  Filled 2014-10-19 (×10): qty 50

## 2014-10-19 MED ORDER — DIPHENHYDRAMINE HCL 50 MG/ML IJ SOLN
12.5000 mg | Freq: Four times a day (QID) | INTRAMUSCULAR | Status: DC | PRN
Start: 2014-10-19 — End: 2014-10-22

## 2014-10-19 MED ORDER — PANTOPRAZOLE SODIUM 40 MG IV SOLR
40.0000 mg | Freq: Every day | INTRAVENOUS | Status: DC
  Administered 2014-10-19 – 2014-10-21 (×3): 40 mg via INTRAVENOUS
  Filled 2014-10-19 (×3): qty 40

## 2014-10-19 MED ORDER — DIPHENHYDRAMINE HCL 12.5 MG/5ML PO ELIX: 12.5000 mg | ORAL_SOLUTION | Freq: Four times a day (QID) | ORAL | Status: DC | PRN

## 2014-10-19 MED ORDER — IOHEXOL 300 MG/ML  SOLN
80.0000 mL | Freq: Once | INTRAMUSCULAR | Status: AC | PRN
  Administered 2014-10-19: 80 mL via INTRAVENOUS

## 2014-10-19 MED ORDER — PIPERACILLIN-TAZOBACTAM 3.375 G IVPB 30 MIN
3.3750 g | Freq: Once | INTRAVENOUS | Status: AC
  Administered 2014-10-19: 3.375 g via INTRAVENOUS
  Filled 2014-10-19: qty 50

## 2014-10-19 MED ORDER — MORPHINE SULFATE 2 MG/ML IJ SOLN
1.0000 mg | INTRAMUSCULAR | Status: DC | PRN
  Administered 2014-10-19 – 2014-10-21 (×3): 2 mg via INTRAVENOUS
  Filled 2014-10-19: qty 1

## 2014-10-19 MED ORDER — IOHEXOL 300 MG/ML  SOLN: 25.0000 mL | INTRAMUSCULAR | Status: AC

## 2014-10-19 MED ORDER — INSULIN ASPART 100 UNIT/ML ~~LOC~~ SOLN
0.0000 [IU] | Freq: Three times a day (TID) | SUBCUTANEOUS | Status: DC
  Administered 2014-10-20: 2 [IU] via SUBCUTANEOUS
  Administered 2014-10-20 – 2014-10-21 (×2): 1 [IU] via SUBCUTANEOUS
  Administered 2014-10-21: 5 [IU] via SUBCUTANEOUS
  Administered 2014-10-22: 1 [IU] via SUBCUTANEOUS

## 2014-10-19 MED ORDER — SODIUM CHLORIDE 0.9 % IV SOLN
Freq: Once | INTRAVENOUS | Status: AC
  Administered 2014-10-19: 15:00:00 via INTRAVENOUS

## 2014-10-19 MED ORDER — CETYLPYRIDINIUM CHLORIDE 0.05 % MT LIQD: 7.0000 mL | Freq: Two times a day (BID) | OROMUCOSAL | Status: DC

## 2014-10-19 MED ORDER — ONDANSETRON HCL 4 MG/2ML IJ SOLN: 4.0000 mg | Freq: Four times a day (QID) | INTRAMUSCULAR | Status: DC | PRN

## 2014-10-19 MED ORDER — CHLORHEXIDINE GLUCONATE 0.12 % MT SOLN
15.0000 mL | Freq: Two times a day (BID) | OROMUCOSAL | Status: DC
  Administered 2014-10-19: 15 mL via OROMUCOSAL
  Filled 2014-10-19 (×2): qty 15

## 2014-10-19 NOTE — ED Notes (Signed)
General surgery at bedside. 

## 2014-10-19 NOTE — ED Notes (Signed)
Pt sts RLQ pain x 4 days and sent here today for appy after having CT scan

## 2014-10-19 NOTE — Telephone Encounter (Signed)
[facility] VO for I-Stat to draw blood for creatinine/GFR in order to administer dye for stat CT of abd/pelvis VO given per Dr. Armen PickupFunches

## 2014-10-19 NOTE — ED Provider Notes (Signed)
CSN: [id number]     Arrival date & time 10/19/14  1328 History   First MD Initiated Contact with Patient 10/19/14 1407     Chief Complaint  Patient presents with  . Abdominal Pain     (Consider location/radiation/quality/duration/timing/severity/associated sxs/prior Treatment) HPI Shawn Patton is a 59 y.o. male history of diabetes, presents to emergency department complaining of abdominal pain. Patient has had pain for 4 days. He was seen by his primary care doctor had [facility] and was sent for outpatient CT scan. CT scan came back showing appendicitis with rupture and periappendiceal abscess. Pt admits to nausea, dry heaves, no actual vomiting. No changes in his bowels. He denies any fever admits to chills. No other complaints. Patient does not speak any English, history provided by patient's son.  Past Medical History  Diagnosis Date  . Diabetes mellitus without complication Dx 2119   Past Surgical History  Procedure Laterality Date  . Stomach surgery  2012   History reviewed. No pertinent family history. History  Substance Use Topics  . Smoking status: Never Smoker   . Smokeless tobacco: Never Used  . Alcohol Use: No    Review of Systems  Constitutional: Negative for fever and chills.  Respiratory: Negative for cough, chest tightness and shortness of breath.   Cardiovascular: Negative for chest pain, palpitations and leg swelling.  Gastrointestinal: Positive for nausea and abdominal pain. Negative for vomiting, diarrhea and abdominal distention.  Musculoskeletal: Negative for myalgias, arthralgias, neck pain and neck stiffness.  Skin: Negative for rash.  Allergic/Immunologic: Negative for immunocompromised state.  Neurological: Negative for dizziness, weakness, light-headedness, numbness and headaches.  All other systems reviewed and are negative.     Allergies  Review of patient's allergies indicates no known allergies.  Home Medications    Prior to Admission medications   Medication Sig Start Date End Date Taking? Authorizing Provider  acetaminophen (TYLENOL 8 HOUR) 650 MG CR tablet Take 1 tablet (650 mg total) by mouth every 8 (eight) hours as needed for pain. 09/10/14   Josalyn Funches, MD  Blood Glucose Monitoring Suppl (BLOOD GLUCOSE METER KIT AND SUPPLIES) Monitor blood glucose twice daily before breakfast and supper 04/28/14   Delfina Redwood, MD  esomeprazole (NEXIUM) 40 MG capsule Take 1 capsule (40 mg total) by mouth daily before breakfast. 08/14/12   Thurnell Lose, MD  gabapentin (NEURONTIN) 300 MG capsule Take 2 capsules (600 mg total) by mouth at bedtime. 09/10/14   Josalyn Funches, MD  insulin aspart protamine- aspart (NOVOLOG MIX 70/30) (70-30) 100 UNIT/ML injection Inject 0.15 mLs (15 Units total) into the skin 2 (two) times daily with a meal. 09/22/14   Boykin Nearing, MD  Insulin Syringe-Needle U-100 29G X 5/16" 1 ML MISC As directed 04/28/14   Delfina Redwood, MD  metFORMIN (GLUCOPHAGE XR) 500 MG 24 hr tablet Take 2 tablets (1,000 mg total) by mouth daily after supper. 09/10/14   Josalyn Funches, MD   BP 135/71 mmHg  Pulse 72  Temp(Src) 98.2 F (36.8 C) (Oral)  Resp 18  SpO2 98% Physical Exam  Constitutional: He is oriented to person, place, and time. He appears well-developed and well-nourished. No distress.  HENT:  Head: Normocephalic and atraumatic.  Eyes: Conjunctivae are normal.  Neck: Neck supple.  Cardiovascular: Normal rate, regular rhythm and normal heart sounds.   Pulmonary/Chest: Effort normal. No respiratory distress. He has no wheezes. He has no rales.  Abdominal: Soft. Bowel sounds are normal. He exhibits  no distension. There is tenderness.  Right lower quadrant tenderness, guarding  Musculoskeletal: He exhibits no edema.  Neurological: He is alert and oriented to person, place, and time.  Skin: Skin is warm and dry.  Nursing note and vitals reviewed.   ED Course  Procedures  (including critical care time) Labs Review Labs Reviewed  COMPREHENSIVE METABOLIC PANEL - Abnormal; Notable for the following:    Glucose, Bld 106 (*)    All other components within normal limits  CBC WITH DIFFERENTIAL/PLATELET  SAMPLE TO BLOOD BANK    Imaging Review Ct Abdomen Pelvis W Contrast  10/19/2014   CLINICAL DATA:  Right lower quadrant pain  EXAM: CT ABDOMEN AND PELVIS WITH CONTRAST  TECHNIQUE: Multidetector CT imaging of the abdomen and pelvis was performed using the standard protocol following bolus administration of intravenous contrast.  CONTRAST:  40m OMNIPAQUE IOHEXOL 300 MG/ML  SOLN  COMPARISON:  None.  FINDINGS: Mild left lower lobe scarring. Negative for pleural effusion. Heart size upper normal.  Fatty infiltration of the liver. 8 mm cyst left lobe of the liver. Gallbladder and bile ducts normal. Pancreas and spleen normal.  Negative for renal mass obstruction or stone. Normal urinary bladder. Mild prostate enlargement.  Rim enhancing fluid collection right lower quadrant below the cecum. Fluid collection measures 36 x 51 mm. Within the fluid collection is a contrast filled structure which appears to represent an irregular appendix. Findings most consistent with ruptured appendix and periappendiceal abscess. There is mild stranding in the fat around the fluid collection.  Negative for bowel obstruction.  Terminal ileum normal.  Negative for free fluid.  Negative for adenopathy.  IMPRESSION: 36 x 51 mm fluid collection inferior to the cecum compatible with periappendiceal abscess from ruptured appendicitis.  Fatty infiltration of the liver.   Electronically Signed   By: CFranchot GalloM.D.   On: 10/19/2014 13:21     EKG Interpretation None      MDM   Final diagnoses:  Ruptured appendix  Appendiceal abscess   Patient with outpatient CT scan showing ruptured appendicitis with periappendiceal abscess. Patient is afebrile, nontoxic appearing. His vital signs are normal with  no evidence of sepsis at this time. IV fluids ordered, labs were ordered, will contact general surgery. We'll ask them about antibiotic of choice.  Spoke with surgery, asked for unasyn. They will come see pt.   Filed Vitals:   10/19/14 1355 10/19/14 1430 10/19/14 1450 10/19/14 1500  BP: 135/71 120/70  123/67  Pulse: 72  64 62  Temp: 98.2 F (36.8 C)     TempSrc: Oral     Resp: _0 SpO2: 98%  97% 97%      TJeannett Senior PA-C 10/19/14 1ElliottYao, MD 10/19/14 1610

## 2014-10-19 NOTE — ED Notes (Signed)
Chandra-son- please call when pt has a room or goes to surgery.  [phone]

## 2014-10-19 NOTE — H&P (Signed)
Shawn Patton 1956-01-07  [id number].   Primary Care MD: Dr. Boykin Nearing Chief Complaint/Reason for Consult: acute perforated appendicitis with abscess HPI: This is a 59 yo [place] male who began having right sided abdominal pain on Friday.  No emesis, but mild nausea.  The patient does not speak English so his son interprets for him.  He denies any diarrhea or fevers.  He has not ate in several days, only drank. His pain worsened and so he called his PCP who sent him for a CT scan.  This revealed acute perforated appendicitis with an abscess.  He was sent to [facility] where we have been asked to see him.  His WBC is normal with no abnormal labs.  ROS : Please see HPI, otherwise negative  History reviewed. No pertinent family history.  Past Medical History  Diagnosis Date  . Diabetes mellitus without complication Dx [id number]    Past Surgical History  Procedure Laterality Date  . Stomach surgery  2012    Social History:  reports that he has never smoked. He has never used smokeless tobacco. He reports that he does not drink alcohol. His drug history is not on file.  Allergies: No Known Allergies   (Not in a hospital admission)  Blood pressure 123/67, pulse 62, temperature 98.2 F (36.8 C), temperature source Oral, resp. rate 19, SpO2 97 %. Physical Exam: General: pleasant, WD, WN Nepalese male who is laying in bed in NAD HEENT: head is normocephalic, atraumatic.  Sclera are noninjected.  PERRL.  Ears and nose without any masses or lesions.  Mouth is pink and moist Heart: regular, rate, and rhythm.  Normal s1,s2. No obvious murmurs, gallops, or rubs noted.  Palpable radial and pedal pulses bilaterally Lungs: CTAB, no wheezes, rhonchi, or rales noted.  Respiratory effort nonlabored Abd: soft, mild diffuse tenderness, but greatest in RLQ, ND, +BS, no masses, hernias, or organomegaly MS: all 4 extremities are symmetrical with no cyanosis, clubbing, or edema. Skin: warm and dry with no masses,  lesions, or rashes Psych: A&Ox3 with an appropriate affect.    Results for orders placed or performed during the hospital encounter of 10/19/14 (from the past 48 hour(s))  CBC with Differential     Status: None   Collection Time: 10/19/14  2:12 PM  Result Value Ref Range   WBC 7.3 4.0 - 10.5 K/uL   RBC 4.87 4.22 - 5.81 MIL/uL   Hemoglobin 14.8 13.0 - 17.0 g/dL   HCT 42.2 39.0 - 52.0 %   MCV 86.7 78.0 - 100.0 fL   MCH 30.4 26.0 - 34.0 pg   MCHC 35.1 30.0 - 36.0 g/dL   RDW 11.7 11.5 - 15.5 %   Platelets 170 150 - 400 K/uL   Neutrophils Relative % 67 43 - 77 %   Neutro Abs 4.9 1.7 - 7.7 K/uL   Lymphocytes Relative 22 12 - 46 %   Lymphs Abs 1.6 0.7 - 4.0 K/uL   Monocytes Relative 10 3 - 12 %   Monocytes Absolute 0.8 0.1 - 1.0 K/uL   Eosinophils Relative 1 0 - 5 %   Eosinophils Absolute 0.0 0.0 - 0.7 K/uL   Basophils Relative 0 0 - 1 %   Basophils Absolute 0.0 0.0 - 0.1 K/uL  Comprehensive metabolic panel     Status: Abnormal   Collection Time: 10/19/14  2:12 PM  Result Value Ref Range   Sodium 136 135 - 145 mmol/L   Potassium 3.9 3.5 - 5.1 mmol/L  Chloride 102 101 - 111 mmol/L   CO2 26 22 - 32 mmol/L   Glucose, Bld 106 (H) 70 - 99 mg/dL   BUN 8 6 - 20 mg/dL   Creatinine, Ser 0.89 0.61 - 1.24 mg/dL   Calcium 9.1 8.9 - 10.3 mg/dL   Total Protein 7.9 6.5 - 8.1 g/dL   Albumin 3.9 3.5 - 5.0 g/dL   AST 23 15 - 41 U/L   ALT 27 17 - 63 U/L   Alkaline Phosphatase 77 38 - 126 U/L   Total Bilirubin 1.2 0.3 - 1.2 mg/dL   GFR calc non Af Amer >60 >60 mL/min   GFR calc Af Amer >60 >60 mL/min    Comment: (NOTE) The eGFR has been calculated using the CKD EPI equation. This calculation has not been validated in all clinical situations. eGFR's persistently <60 mL/min signify possible Chronic Kidney Disease.    Anion gap 8 5 - 15  Sample to Blood Bank     Status: None   Collection Time: 10/19/14  2:12 PM  Result Value Ref Range   Blood Bank Specimen SAMPLE AVAILABLE FOR TESTING     Sample Expiration 10/20/2014    Ct Abdomen Pelvis W Contrast  10/19/2014   CLINICAL DATA:  Right lower quadrant pain  EXAM: CT ABDOMEN AND PELVIS WITH CONTRAST  TECHNIQUE: Multidetector CT imaging of the abdomen and pelvis was performed using the standard protocol following bolus administration of intravenous contrast.  CONTRAST:  55m OMNIPAQUE IOHEXOL 300 MG/ML  SOLN  COMPARISON:  None.  FINDINGS: Mild left lower lobe scarring. Negative for pleural effusion. Heart size upper normal.  Fatty infiltration of the liver. 8 mm cyst left lobe of the liver. Gallbladder and bile ducts normal. Pancreas and spleen normal.  Negative for renal mass obstruction or stone. Normal urinary bladder. Mild prostate enlargement.  Rim enhancing fluid collection right lower quadrant below the cecum. Fluid collection measures 36 x 51 mm. Within the fluid collection is a contrast filled structure which appears to represent an irregular appendix. Findings most consistent with ruptured appendix and periappendiceal abscess. There is mild stranding in the fat around the fluid collection.  Negative for bowel obstruction.  Terminal ileum normal.  Negative for free fluid.  Negative for adenopathy.  IMPRESSION: 36 x 51 mm fluid collection inferior to the cecum compatible with periappendiceal abscess from ruptured appendicitis.  Fatty infiltration of the liver.   Electronically Signed   By: CFranchot GalloM.D.   On: 10/19/2014 13:21       Assessment/Plan 1. Acute perforated appendicitis with 3x5cm abscess -will admit for IV abx therapy. -consult IR for perc drain of abscess and treat nonoperatively.  The patient's vitals are normal with a normal WBC.  I do not think the patient needs an emergency appendectomy.  If he does not improve with a drain and conservative management, he may require an operation while here. -IVFs, NPO x ice chips today and NPO after MN for IR procedure tomorrow. -repeat labs along with PT/INR in am -hold DVt  prophylaxis for procedure tomorrow 2. DM -SSI  Eliyana Pagliaro E 10/19/2014, 3:42 PM Pager: [phone]

## 2014-10-19 NOTE — Progress Notes (Signed)
Admitted to room 6n20, oriented to unit set up, call light system. Patient unable to speak english, nephew at bedside to help translate. VSS. Plan of care discussed with the nephew. Will endorse appropriately.

## 2014-10-19 NOTE — Progress Notes (Signed)
Subjective:     Patient ID: Alfredo MartinezDhan Sallie, male   DOB: 12/27/1955, 59 y.o.   MRN: [id number]  HPI   Patient presents today with a complaint of right lower quadrant pain since Friday. Information is obained with the help of an intrepretor. He denies any vomiting but says he has been nauseated. He has not had a BM since last Monday. He reports usually having one a day. There is pain with walking. He describes the pain as cramping. He has felt feverish but has not documented a fever. He still has his appendix. He denies aggravating or relieving factor. He has not tried any medications.   Review of Systems   GEN: no chills, wt. Loss or documented fever RESP:  No coughing or SOB CARD:  No chest pain or palpitations. GI:  Denies diarrhea or distention.. Admits to abd pain with some nausea and constipation    Objective:   Physical Exam   GEN:  Alert, oriented, appropriate. Seems in moderate distress. SKIN:  Warm and dry RESP:  Lungs are clear to auscultation CARD: Heard sounds are regular w/o m,g,r GI:    BS are slightly decreased on the right. There is tenderness and guarding in the right lower quadrant.     Assessment:     Abd pain, possible appendicitis vs. Obstruction.    Plan:     We have sent for a STAT CT of abd. Will make decisions based on those results.      NOTE:  CT positive for appendicitis. Patient sent to ED and was in ED when I called to give report      Henrietta HooverLinda C. Bernhardt, FNP-BC [facility].

## 2014-10-20 LAB — BASIC METABOLIC PANEL
ANION GAP: 9 (ref 5–15)
BUN: 8 mg/dL (ref 6–20)
CO2: 24 mmol/L (ref 22–32)
Calcium: 8.8 mg/dL — ABNORMAL LOW (ref 8.9–10.3)
Chloride: 105 mmol/L (ref 101–111)
Creatinine, Ser: 0.9 mg/dL (ref 0.61–1.24)
GFR calc Af Amer: >60 mL/min (ref >60)
GFR calc non Af Amer: >60 mL/min (ref >60)
Glucose, Bld: 157 mg/dL — ABNORMAL HIGH (ref 70–99)
POTASSIUM: 4.1 mmol/L (ref 3.5–5.1)
Sodium: 138 mmol/L (ref 135–145)

## 2014-10-20 LAB — GLUCOSE, CAPILLARY
Glucose-Capillary: 152 mg/dL — ABNORMAL HIGH (ref 70–99)
Glucose-Capillary: 148 mg/dL — ABNORMAL HIGH (ref 70–99)
Glucose-Capillary: 144 mg/dL — ABNORMAL HIGH (ref 70–99)
Glucose-Capillary: 116 mg/dL — ABNORMAL HIGH (ref 70–99)

## 2014-10-20 LAB — PROTIME-INR
INR: 1.08 (ref 0.00–1.49)
PROTHROMBIN TIME: 14.1 s (ref 11.6–15.2)

## 2014-10-20 LAB — CBC
HCT: 41.2 % (ref 39.0–52.0)
Hemoglobin: 14.6 g/dL (ref 13.0–17.0)
MCH: 30.7 pg (ref 26.0–34.0)
MCHC: 35.4 g/dL (ref 30.0–36.0)
MCV: 86.6 fL (ref 78.0–100.0)
PLATELETS: 177 10*3/uL (ref 150–400)
RBC: 4.76 MIL/uL (ref 4.22–5.81)
RDW: 11.6 % (ref 11.5–15.5)
WBC: 6.1 10*3/uL (ref 4.0–10.5)

## 2014-10-20 NOTE — Care Management Note (Signed)
Case Management Note  Patient Details  Name: Alfredo MartinezDhan Dibartolo MRN: [id number] Date of Birth: 01/03/1956  Subjective/Objective:                    Action/Plan: UR completed.  Expected Discharge Date:  10/22/14               Expected Discharge Plan:  Home/Self Care  In-House Referral:     Discharge planning Services     Post Acute Care Choice:    Choice offered to:     DME Arranged:    DME Agency:     HH Arranged:    HH Agency:     Status of Service:  In process, will continue to follow  Medicare Important Message Given:    Date Medicare IM Given:    Medicare IM give by:    Date Additional Medicare IM Given:    Additional Medicare Important Message give by:     If discussed at Long Length of Stay Meetings, dates discussed:    Additional Comments:  Kingsley PlanWile, Mirabelle Cyphers Marie, RN 10/20/2014, 12:51 PM

## 2014-10-20 NOTE — Progress Notes (Signed)
Patient ID: Shawn Patton, male   DOB: 08/26/1955, 59 y.o.   MRN: [id number]    Subjective: Pt feels ok this morning.  Still with some pain, but comfortable  Objective: Vital signs in last 24 hours: Temp:  [98.1 F (36.7 C)-98.4 F (36.9 C)] 98.4 F (36.9 C) (05/10 0602) Pulse Rate:  [55-95] 61 (05/10 0602) Resp:  [14-21] 16 (05/10 0602) BP: (111-135)/(63-87) 112/71 mmHg (05/10 0602) SpO2:  [97 %-100 %] 98 % (05/10 0602) Weight:  [69.582 kg (153 lb 6.4 oz)] 69.582 kg (153 lb 6.4 oz) (05/09 1010) Last BM Date: 10/19/14  Intake/Output from previous day: 05/09 0701 - 05/10 0700 In: 1328 [I.V.:1251; IV Piggyback:77] Out: 600 [Urine:600] Intake/Output this shift:    PE: Abd: soft, still somewhat tender on right side, +BS HEart: regular Lungs: CTAB  Lab Results:   Recent Labs  10/19/14 1412 10/20/14 0710  WBC 7.3 6.1  HGB 14.8 14.6  HCT 42.2 41.2  PLT 170 177   BMET  Recent Labs  10/19/14 1412 10/20/14 0710  NA 136 138  K 3.9 4.1  CL 102 105  CO2 26 24  GLUCOSE 106* 157*  BUN 8 8  CREATININE 0.89 0.90  CALCIUM 9.1 8.8*   PT/INR  Recent Labs  10/20/14 0710  LABPROT 14.1  INR 1.08   CMP     Component Value Date/Time   NA 138 10/20/2014 0710   K 4.1 10/20/2014 0710   CL 105 10/20/2014 0710   CO2 24 10/20/2014 0710   GLUCOSE 157* 10/20/2014 0710   BUN 8 10/20/2014 0710   CREATININE 0.90 10/20/2014 0710   CREATININE 0.78 05/06/2014 1234   CALCIUM 8.8* 10/20/2014 0710   PROT 7.9 10/19/2014 1412   ALBUMIN 3.9 10/19/2014 1412   AST 23 10/19/2014 1412   ALT 27 10/19/2014 1412   ALKPHOS 77 10/19/2014 1412   BILITOT 1.2 10/19/2014 1412   GFRNONAA >60 10/20/2014 0710   GFRAA >60 10/20/2014 0710   Lipase  No results found for: LIPASE     Studies/Results: Ct Abdomen Pelvis W Contrast  10/19/2014   CLINICAL DATA:  Right lower quadrant pain  EXAM: CT ABDOMEN AND PELVIS WITH CONTRAST  TECHNIQUE: Multidetector CT imaging of the abdomen and pelvis was  performed using the standard protocol following bolus administration of intravenous contrast.  CONTRAST:  80mL OMNIPAQUE IOHEXOL 300 MG/ML  SOLN  COMPARISON:  None.  FINDINGS: Mild left lower lobe scarring. Negative for pleural effusion. Heart size upper normal.  Fatty infiltration of the liver. 8 mm cyst left lobe of the liver. Gallbladder and bile ducts normal. Pancreas and spleen normal.  Negative for renal mass obstruction or stone. Normal urinary bladder. Mild prostate enlargement.  Rim enhancing fluid collection right lower quadrant below the cecum. Fluid collection measures 36 x 51 mm. Within the fluid collection is a contrast filled structure which appears to represent an irregular appendix. Findings most consistent with ruptured appendix and periappendiceal abscess. There is mild stranding in the fat around the fluid collection.  Negative for bowel obstruction.  Terminal ileum normal.  Negative for free fluid.  Negative for adenopathy.  IMPRESSION: 36 x 51 mm fluid collection inferior to the cecum compatible with periappendiceal abscess from ruptured appendicitis.  Fatty infiltration of the liver.   Electronically Signed   By: Charles  Clark M.D.   On: 10/19/2014 13:21    Anti-infectives: Anti-infectives    Start     Dose/Rate Route Frequency Ordered Stop   10/19/14 2200    piperacillin-tazobactam (ZOSYN) IVPB 3.375 g     3.375 g 12.5 mL/hr over 240 Minutes Intravenous 3 times per day 10/19/14 1846     10/19/14 1445  piperacillin-tazobactam (ZOSYN) IVPB 3.375 g     3.375 g 100 mL/hr over 30 Minutes Intravenous  Once 10/19/14 1441 10/19/14 1523       Assessment/Plan  1. Acute appendicitis, ? Perforation vs mucocele -D/w Dr. Ardelle AntonWagoner with IR today.  There is a concern that what was initially called a periappendiceal abscess is just a really enlarged appendix or a mucocele.  After review of the scan with Dr. Donell BeersByerly, we agree and will choose to proceed with an operation instead of a perc  drain. -NPO -explained this via an interpretor.  The patient understands and is agreeable to proceed. Zosyn D2   LOS: 1 day    Eugenio Dollins E 10/20/2014, 9:32 AM Pager: [phone]

## 2014-10-20 NOTE — Progress Notes (Signed)
Due to emergency cases, we will move his surgery to tomorrow.  Clear liquids today and NPO p MN.  Olyvia Gopal E 2:12 PM 10/20/2014

## 2014-10-20 NOTE — Progress Notes (Signed)
Initial Nutrition Assessment  DOCUMENTATION CODES:  Not applicable  INTERVENTION:  Other (Comment) (RD will follow for diet advancement)  NUTRITION DIAGNOSIS:  Inadequate oral intake related to altered GI function as evidenced by NPO status.  GOAL:  Patient will meet greater than or equal to 90% of their needs  MONITOR:  PO intake, Diet advancement, Labs, Weight trends, I & O's, Skin  REASON FOR ASSESSMENT:  Malnutrition Screening Tool    ASSESSMENT: This is a 59 yo [place] male who began having right sided abdominal pain on Friday. No emesis, but mild nausea. The patient does not speak English so his son interprets for him. He denies any diarrhea or fevers. He has not ate in several days, only drank. His pain worsened and so he called his PCP who sent him for a CT scan. This revealed acute perforated appendicitis with an abscess. [facility] asked [facility]. His WBC is normal with no abnormal labs.  Pt admitted with acute perforated appendicitis with abscess.  Pt was asleep or in with MD at time of multiple visits. Plan is for pt to go to IR today due to concern for enlarged appendix or mucocele.  No signs of fat of muscle depletion noted. Wt has been stable per wt hx.  Pt is currently NPO for procedure.  Labs reviewed. CBGS: 113-153. Excellent glycemic control noted (Hgb A1c: 5.3 in 08/2014).   Height:  Ht Readings from Last 1 Encounters:  09/10/14 5\' 4"  (1.626 m)    Weight:  Wt Readings from Last 1 Encounters:  10/19/14 153 lb 6.4 oz (69.582 kg)    Ideal Body Weight:  59 kg  Wt Readings from Last 10 Encounters:  10/19/14 153 lb 6.4 oz (69.582 kg)  09/10/14 155 lb (70.308 kg)  05/06/14 140 lb (63.504 kg)  04/27/14 137 lb 12.6 oz (62.5 kg)  03/10/11 152 lb 14.4 oz (69.355 kg)    BMI:  Body mass index is 26.32 kg/(m^2). Overweight  Estimated Nutritional Needs:  Kcal:  1800-2000  Protein:  85-95 grams  Fluid:  1.8-2.0  L  Skin:  Reviewed, no issues  Diet Order:  Diet NPO time specified Except for: Ice Chips  EDUCATION NEEDS:  Education needs no appropriate at this time   Intake/Output Summary (Last 24 hours) at 10/20/14 1017 Last data filed at 10/20/14 0521  Gross per 24 hour  Intake   1328 ml  Output    600 ml  Net    728 ml    Last BM:  10/19/14  Calin Ellery A. Mayford KnifeWilliams, RD, LDN, CDE Pager: [phone] After hours Pager: [phone]

## 2014-10-21 ENCOUNTER — Inpatient Hospital Stay (HOSPITAL_COMMUNITY): Payer: Medicaid Other | Admitting: Certified Registered Nurse Anesthetist

## 2014-10-21 ENCOUNTER — Encounter (HOSPITAL_COMMUNITY): Admission: EM | Disposition: A | Discharge status: Home / Self Care | Payer: Self-pay | Source: Home / Self Care

## 2014-10-21 HISTORY — PX: LAPAROSCOPIC APPENDECTOMY: SHX408

## 2014-10-21 LAB — GLUCOSE, CAPILLARY
GLUCOSE-CAPILLARY: 133 mg/dL — AB (ref 70–99)
Glucose-Capillary: 146 mg/dL — ABNORMAL HIGH (ref 70–99)
Glucose-Capillary: 157 mg/dL — ABNORMAL HIGH (ref 70–99)
Glucose-Capillary: 262 mg/dL — ABNORMAL HIGH (ref 70–99)
Glucose-Capillary: 199 mg/dL — ABNORMAL HIGH (ref 70–99)

## 2014-10-21 LAB — SURGICAL PCR SCREEN
MRSA, PCR: NEGATIVE
Staphylococcus aureus: NEGATIVE

## 2014-10-21 SURGERY — APPENDECTOMY, LAPAROSCOPIC
Anesthesia: General | Site: Abdomen

## 2014-10-21 MED ORDER — PROPOFOL 10 MG/ML IV BOLUS
INTRAVENOUS | Status: DC | PRN
  Administered 2014-10-21: 200 mg via INTRAVENOUS

## 2014-10-21 MED ORDER — SUCCINYLCHOLINE CHLORIDE 20 MG/ML IJ SOLN
INTRAMUSCULAR | Status: AC
  Filled 2014-10-21: qty 1

## 2014-10-21 MED ORDER — NEOSTIGMINE METHYLSULFATE 10 MG/10ML IV SOLN
INTRAVENOUS | Status: AC
  Filled 2014-10-21: qty 1

## 2014-10-21 MED ORDER — BUPIVACAINE-EPINEPHRINE (PF) 0.25% -1:200000 IJ SOLN
INTRAMUSCULAR | Status: AC
  Filled 2014-10-21: qty 30

## 2014-10-21 MED ORDER — HYDROMORPHONE HCL 1 MG/ML IJ SOLN
2.0000 mg | INTRAMUSCULAR | Status: DC | PRN
  Administered 2014-10-21: 2 mg via INTRAVENOUS
  Filled 2014-10-21: qty 2

## 2014-10-21 MED ORDER — KCL IN DEXTROSE-NACL 20-5-0.45 MEQ/L-%-% IV SOLN
INTRAVENOUS | Status: DC
  Administered 2014-10-21: 18:00:00 via INTRAVENOUS
  Filled 2014-10-21 (×4): qty 1000

## 2014-10-21 MED ORDER — OXYCODONE-ACETAMINOPHEN 5-325 MG PO TABS
1.0000 | ORAL_TABLET | ORAL | Status: DC | PRN
  Administered 2014-10-22: 1 via ORAL
  Filled 2014-10-21: qty 1

## 2014-10-21 MED ORDER — ROCURONIUM BROMIDE 50 MG/5ML IV SOLN
INTRAVENOUS | Status: AC
  Filled 2014-10-21: qty 1

## 2014-10-21 MED ORDER — SUCCINYLCHOLINE CHLORIDE 20 MG/ML IJ SOLN
INTRAMUSCULAR | Status: DC | PRN
  Administered 2014-10-21: 100 mg via INTRAVENOUS

## 2014-10-21 MED ORDER — LIDOCAINE HCL (CARDIAC) 20 MG/ML IV SOLN
INTRAVENOUS | Status: DC | PRN
  Administered 2014-10-21: 100 mg via INTRAVENOUS

## 2014-10-21 MED ORDER — KETOROLAC TROMETHAMINE 30 MG/ML IJ SOLN: 30.0000 mg | Freq: Once | INTRAMUSCULAR | Status: DC | PRN

## 2014-10-21 MED ORDER — ONDANSETRON HCL 4 MG/2ML IJ SOLN
INTRAMUSCULAR | Status: AC
  Filled 2014-10-21: qty 2

## 2014-10-21 MED ORDER — PROMETHAZINE HCL 25 MG/ML IJ SOLN: 6.2500 mg | INTRAMUSCULAR | Status: DC | PRN

## 2014-10-21 MED ORDER — 0.9 % SODIUM CHLORIDE (POUR BTL) OPTIME
TOPICAL | Status: DC | PRN
  Administered 2014-10-21: 1000 mL

## 2014-10-21 MED ORDER — ROCURONIUM BROMIDE 100 MG/10ML IV SOLN
INTRAVENOUS | Status: DC | PRN
  Administered 2014-10-21: 10 mg via INTRAVENOUS
  Administered 2014-10-21: 20 mg via INTRAVENOUS

## 2014-10-21 MED ORDER — ONDANSETRON HCL 4 MG/2ML IJ SOLN
INTRAMUSCULAR | Status: DC | PRN
  Administered 2014-10-21: 4 mg via INTRAVENOUS

## 2014-10-21 MED ORDER — LIDOCAINE HCL (PF) 1 % IJ SOLN
INTRAMUSCULAR | Status: AC
  Filled 2014-10-21: qty 30

## 2014-10-21 MED ORDER — SODIUM CHLORIDE 0.9 % IR SOLN
Status: DC | PRN
  Administered 2014-10-21: 1

## 2014-10-21 MED ORDER — FENTANYL CITRATE (PF) 250 MCG/5ML IJ SOLN
INTRAMUSCULAR | Status: AC
  Filled 2014-10-21: qty 5

## 2014-10-21 MED ORDER — LACTATED RINGERS IV SOLN
INTRAVENOUS | Status: DC | PRN
  Administered 2014-10-21 (×2): via INTRAVENOUS

## 2014-10-21 MED ORDER — HYDROMORPHONE HCL 1 MG/ML IJ SOLN: 0.2500 mg | INTRAMUSCULAR | Status: DC | PRN

## 2014-10-21 MED ORDER — GABAPENTIN 300 MG PO CAPS
600.0000 mg | ORAL_CAPSULE | Freq: Every day | ORAL | Status: DC
  Administered 2014-10-21: 600 mg via ORAL
  Filled 2014-10-21: qty 2

## 2014-10-21 MED ORDER — LACTATED RINGERS IV SOLN
INTRAVENOUS | Status: DC
  Administered 2014-10-21: 11:00:00 via INTRAVENOUS

## 2014-10-21 MED ORDER — LIDOCAINE HCL (CARDIAC) 20 MG/ML IV SOLN
INTRAVENOUS | Status: AC
  Filled 2014-10-21: qty 5

## 2014-10-21 MED ORDER — MIDAZOLAM HCL 2 MG/2ML IJ SOLN
INTRAMUSCULAR | Status: AC
  Filled 2014-10-21: qty 2

## 2014-10-21 MED ORDER — LIDOCAINE HCL 4 % MT SOLN: OROMUCOSAL | Status: DC | PRN

## 2014-10-21 MED ORDER — GLYCOPYRROLATE 0.2 MG/ML IJ SOLN
INTRAMUSCULAR | Status: DC | PRN
  Administered 2014-10-21: 0.2 mg via INTRAVENOUS
  Administered 2014-10-21: 0.4 mg via INTRAVENOUS

## 2014-10-21 MED ORDER — PROPOFOL 10 MG/ML IV BOLUS
INTRAVENOUS | Status: AC
Start: 2014-10-21 — End: 2014-10-21
  Filled 2014-10-21: qty 20

## 2014-10-21 MED ORDER — GLYCOPYRROLATE 0.2 MG/ML IJ SOLN
INTRAMUSCULAR | Status: AC
  Filled 2014-10-21: qty 1

## 2014-10-21 MED ORDER — PANTOPRAZOLE SODIUM 40 MG PO TBEC
40.0000 mg | DELAYED_RELEASE_TABLET | Freq: Every day | ORAL | Status: DC
Start: 2014-10-21 — End: 2014-10-22
  Administered 2014-10-21 – 2014-10-22 (×2): 40 mg via ORAL
  Filled 2014-10-21: qty 1

## 2014-10-21 MED ORDER — GLYCOPYRROLATE 0.2 MG/ML IJ SOLN
INTRAMUSCULAR | Status: AC
  Filled 2014-10-21: qty 2

## 2014-10-21 MED ORDER — FENTANYL CITRATE (PF) 100 MCG/2ML IJ SOLN
INTRAMUSCULAR | Status: DC | PRN
  Administered 2014-10-21: 25 ug via INTRAVENOUS
  Administered 2014-10-21: 100 ug via INTRAVENOUS

## 2014-10-21 MED ORDER — NEOSTIGMINE METHYLSULFATE 10 MG/10ML IV SOLN
INTRAVENOUS | Status: DC | PRN
  Administered 2014-10-21: 3 mg via INTRAVENOUS

## 2014-10-21 MED ORDER — HYDROMORPHONE HCL 1 MG/ML IJ SOLN
INTRAMUSCULAR | Status: AC
  Filled 2014-10-21: qty 1

## 2014-10-21 SURGICAL SUPPLY — 43 items
APPLIER CLIP ROT 10 11.4 M/L (STAPLE)
BLADE SURG ROTATE 9660 (MISCELLANEOUS) ×3 IMPLANT
CANISTER SUCTION 2500CC (MISCELLANEOUS) ×3 IMPLANT
CHLORAPREP W/TINT 26ML (MISCELLANEOUS) ×3 IMPLANT
CLIP APPLIE ROT 10 11.4 M/L (STAPLE) IMPLANT
COVER SURGICAL LIGHT HANDLE (MISCELLANEOUS) ×3 IMPLANT
CUTTER FLEX LINEAR 45M (STAPLE) ×3 IMPLANT
DRAPE LAPAROSCOPIC ABDOMINAL (DRAPES) ×3 IMPLANT
DRAPE WARM FLUID 44X44 (DRAPE) ×3 IMPLANT
ELECT REM PT RETURN 9FT ADLT (ELECTROSURGICAL) ×3
ELECTRODE REM PT RTRN 9FT ADLT (ELECTROSURGICAL) ×1 IMPLANT
ENDOLOOP SUT PDS II  0 18 (SUTURE)
ENDOLOOP SUT PDS II 0 18 (SUTURE) IMPLANT
GLOVE BIO SURGEON STRL SZ 6 (GLOVE) ×3 IMPLANT
GLOVE BIO SURGEON STRL SZ 6.5 (GLOVE) ×4 IMPLANT
GLOVE BIO SURGEON STRL SZ7 (GLOVE) ×3 IMPLANT
GLOVE BIO SURGEONS STRL SZ 6.5 (GLOVE) ×2
GLOVE BIOGEL PI IND STRL 6.5 (GLOVE) ×2 IMPLANT
GLOVE BIOGEL PI IND STRL 7.0 (GLOVE) ×2 IMPLANT
GLOVE BIOGEL PI INDICATOR 6.5 (GLOVE) ×4
GLOVE BIOGEL PI INDICATOR 7.0 (GLOVE) ×4
GOWN STRL REUS W/ TWL LRG LVL3 (GOWN DISPOSABLE) ×2 IMPLANT
GOWN STRL REUS W/TWL 2XL LVL3 (GOWN DISPOSABLE) ×3 IMPLANT
GOWN STRL REUS W/TWL LRG LVL3 (GOWN DISPOSABLE) ×4
KIT BASIN OR (CUSTOM PROCEDURE TRAY) ×3 IMPLANT
KIT ROOM TURNOVER OR (KITS) ×3 IMPLANT
LIQUID BAND (GAUZE/BANDAGES/DRESSINGS) ×3 IMPLANT
NS IRRIG 1000ML POUR BTL (IV SOLUTION) ×3 IMPLANT
PAD ARMBOARD 7.5X6 YLW CONV (MISCELLANEOUS) ×6 IMPLANT
POUCH SPECIMEN RETRIEVAL 10MM (ENDOMECHANICALS) ×3 IMPLANT
RELOAD STAPLE TA45 3.5 REG BLU (ENDOMECHANICALS) ×6 IMPLANT
SCALPEL HARMONIC ACE (MISCELLANEOUS) ×3 IMPLANT
SET IRRIG TUBING LAPAROSCOPIC (IRRIGATION / IRRIGATOR) ×3 IMPLANT
SLEEVE ENDOPATH XCEL 5M (ENDOMECHANICALS) ×3 IMPLANT
SPECIMEN JAR SMALL (MISCELLANEOUS) ×3 IMPLANT
SUT MNCRL AB 4-0 PS2 18 (SUTURE) ×3 IMPLANT
TOWEL OR 17X24 6PK STRL BLUE (TOWEL DISPOSABLE) ×3 IMPLANT
TOWEL OR 17X26 10 PK STRL BLUE (TOWEL DISPOSABLE) IMPLANT
TRAY FOLEY CATH 16FR SILVER (SET/KITS/TRAYS/PACK) ×3 IMPLANT
TRAY LAPAROSCOPIC (CUSTOM PROCEDURE TRAY) ×3 IMPLANT
TROCAR XCEL BLUNT TIP 100MML (ENDOMECHANICALS) ×3 IMPLANT
TROCAR XCEL NON-BLD 5MMX100MML (ENDOMECHANICALS) ×3 IMPLANT
TUBING INSUFFLATION (TUBING) ×3 IMPLANT

## 2014-10-21 NOTE — Op Note (Signed)
Appendectomy, Lap, Procedure Note  Indications: The patient presented with a history of right-sided abdominal pain. A CT revealed findings consistent with acute appendicitis and mucocele.  Pre-operative Diagnosis: Same  Post-operative Diagnosis: acute dilated appendicitis.    Surgeon: Almond LintBYERLY,Khamil Lamica   Assistants: Leonia CoronaAriel Hilsinger, PA-S  Anesthesia: General endotracheal anesthesia and Local anesthesia 1% plain lidocaine, 0.25.% bupivacaine, with epinephrine  ASA Class: 3  Procedure Details  The patient was seen again in the Holding Room. The risks, benefits, complications, treatment options, and expected outcomes were discussed with the patient and/or family. The possibilities of perforation of viscus, bleeding, recurrent infection, the need for additional procedures, failure to diagnose a condition, and creating a complication requiring transfusion or operation were discussed. There was concurrence with the proposed plan and informed consent was obtained. The site of surgery was properly noted. The patient was taken to Operating Room, identified as Shawn Patton and the procedure verified as Appendectomy. A Time Out was held and the above information confirmed.  The patient was placed in the supine position and general anesthesia was induced, along with placement of orogastric tube, Venodyne boots, and a Foley catheter. The abdomen was prepped and draped in a sterile fashion. Local anesthetic was infiltrated in the infraumbilical region.  A 1.5 cm curvilinear transverse incision was made just below the umbilicus.  The Kelly clamp was used to spread the subcutaneous tissues.  The fascia was elevated with 2 Kocher clamps and incised with the #11 blade.  A Tresa EndoKelly was used to confirm entrance into the peritoneal cavity.  A pursestring suture was placed around the fascial incision.  The Hasson trocar was inserted into the abdomen and held in place with the tails of the suture.  The pneumoperitoneum was then  established to steady pressure of 15 mmHg.     Additional 5 mm cannulas then placed in the left lower quadrant of the abdomen and the suprapubic region under direct visualization.  A careful evaluation of the entire abdomen was carried out. The patient was placed in Trendelenburg and rotated to the left.  The small intestines were retracted in the cephalad and left lateral direction away from the pelvis and right lower quadrant. The patient was found to have an enlarged and inflamed appendix that was extending into the pelvis. There was no evidence of perforation.  The appendix was carefully dissected. The appendix was was skeletonized with the harmonic scalpel.   The appendix was divided at its base using an endo-GIA stapler. Minimal appendiceal stump was left in place. The appendix was removed from the abdomen with an Endocatch bag through the left subcostal port.  There was no evidence of bleeding, leakage, or complication after division of the appendix. Irrigation was also performed and irrigate suctioned from the abdomen as well.  The 5 mm trocars were removed.  The pneumoperitoneum was evacuated from the abdomen.    The trocar site skin wounds were closed with 4-0 Monocryl and dressed with Dermabond.  Instrument, sponge, and needle counts were correct at the conclusion of the case.   Findings: The appendix was found to be inflamed. There were not signs of necrosis.  There was not perforation. There was abscess formation.  Estimated Blood Loss:  Minimal         Drains: none            Specimens: appendix         Complications:  None; patient tolerated the procedure well.  Disposition: PACU - hemodynamically stable.         Condition: stable

## 2014-10-21 NOTE — H&P (View-Only) (Signed)
Patient ID: Shawn MartinezDhan Kinsel, male   DOB: 10/30/1955, 59 y.o.   MRN: [id number]    Subjective: Pt feels ok this morning.  Still with some pain, but comfortable  Objective: Vital signs in last 24 hours: Temp:  [98.1 F (36.7 C)-98.4 F (36.9 C)] 98.4 F (36.9 C) (05/10 0602) Pulse Rate:  [55-95] 61 (05/10 0602) Resp:  [14-21] 16 (05/10 0602) BP: (111-135)/(63-87) 112/71 mmHg (05/10 0602) SpO2:  [97 %-100 %] 98 % (05/10 0602) Weight:  [69.582 kg (153 lb 6.4 oz)] 69.582 kg (153 lb 6.4 oz) (05/09 1010) Last BM Date: 10/19/14  Intake/Output from previous day: 05/09 0701 - 05/10 0700 In: 1328 [I.V.:1251; IV Piggyback:77] Out: 600 [Urine:600] Intake/Output this shift:    PE: Abd: soft, still somewhat tender on right side, +BS HEart: regular Lungs: CTAB  Lab Results:   Recent Labs  10/19/14 1412 10/20/14 0710  WBC 7.3 6.1  HGB 14.8 14.6  HCT 42.2 41.2  PLT 170 177   BMET  Recent Labs  10/19/14 1412 10/20/14 0710  NA 136 138  K 3.9 4.1  CL 102 105  CO2 26 24  GLUCOSE 106* 157*  BUN 8 8  CREATININE 0.89 0.90  CALCIUM 9.1 8.8*   PT/INR  Recent Labs  10/20/14 0710  LABPROT 14.1  INR 1.08   CMP     Component Value Date/Time   NA 138 10/20/2014 0710   K 4.1 10/20/2014 0710   CL 105 10/20/2014 0710   CO2 24 10/20/2014 0710   GLUCOSE 157* 10/20/2014 0710   BUN 8 10/20/2014 0710   CREATININE 0.90 10/20/2014 0710   CREATININE 0.78 05/06/2014 1234   CALCIUM 8.8* 10/20/2014 0710   PROT 7.9 10/19/2014 1412   ALBUMIN 3.9 10/19/2014 1412   AST 23 10/19/2014 1412   ALT 27 10/19/2014 1412   ALKPHOS 77 10/19/2014 1412   BILITOT 1.2 10/19/2014 1412   GFRNONAA >60 10/20/2014 0710   GFRAA >60 10/20/2014 0710   Lipase  No results found for: LIPASE     Studies/Results: Ct Abdomen Pelvis W Contrast  10/19/2014   CLINICAL DATA:  Right lower quadrant pain  EXAM: CT ABDOMEN AND PELVIS WITH CONTRAST  TECHNIQUE: Multidetector CT imaging of the abdomen and pelvis was  performed using the standard protocol following bolus administration of intravenous contrast.  CONTRAST:  80mL OMNIPAQUE IOHEXOL 300 MG/ML  SOLN  COMPARISON:  None.  FINDINGS: Mild left lower lobe scarring. Negative for pleural effusion. Heart size upper normal.  Fatty infiltration of the liver. 8 mm cyst left lobe of the liver. Gallbladder and bile ducts normal. Pancreas and spleen normal.  Negative for renal mass obstruction or stone. Normal urinary bladder. Mild prostate enlargement.  Rim enhancing fluid collection right lower quadrant below the cecum. Fluid collection measures 36 x 51 mm. Within the fluid collection is a contrast filled structure which appears to represent an irregular appendix. Findings most consistent with ruptured appendix and periappendiceal abscess. There is mild stranding in the fat around the fluid collection.  Negative for bowel obstruction.  Terminal ileum normal.  Negative for free fluid.  Negative for adenopathy.  IMPRESSION: 36 x 51 mm fluid collection inferior to the cecum compatible with periappendiceal abscess from ruptured appendicitis.  Fatty infiltration of the liver.   Electronically Signed   By: Marlan Palauharles  Clark M.D.   On: 10/19/2014 13:21    Anti-infectives: Anti-infectives    Start     Dose/Rate Route Frequency Ordered Stop   10/19/14 2200  piperacillin-tazobactam (ZOSYN) IVPB 3.375 g     3.375 g 12.5 mL/hr over 240 Minutes Intravenous 3 times per day 10/19/14 1846     10/19/14 1445  piperacillin-tazobactam (ZOSYN) IVPB 3.375 g     3.375 g 100 mL/hr over 30 Minutes Intravenous  Once 10/19/14 1441 10/19/14 1523       Assessment/Plan  1. Acute appendicitis, ? Perforation vs mucocele -D/w Dr. Ardelle AntonWagoner with IR today.  There is a concern that what was initially called a periappendiceal abscess is just a really enlarged appendix or a mucocele.  After review of the scan with Dr. Donell BeersByerly, we agree and will choose to proceed with an operation instead of a perc  drain. -NPO -explained this via an interpretor.  The patient understands and is agreeable to proceed. Zosyn D2   LOS: 1 day    Leavy Heatherly E 10/20/2014, 9:32 AM Pager: [phone]

## 2014-10-21 NOTE — Anesthesia Preprocedure Evaluation (Addendum)
Anesthesia Evaluation  Patient identified by MRN, date of birth, ID band Patient awake    Reviewed: Allergy & Precautions, NPO status , Patient's Chart, lab work & pertinent test results  Airway Mallampati: II  TM Distance: >3 FB Neck ROM: Full    Dental no notable dental hx.    Pulmonary neg pulmonary ROS,  breath sounds clear to auscultation  Pulmonary exam normal       Cardiovascular negative cardio ROS Normal cardiovascular examRhythm:Regular Rate:Normal     Neuro/Psych negative neurological ROS  negative psych ROS   GI/Hepatic Neg liver ROS, GERD-  Medicated,  Endo/Other  diabetes, Insulin Dependent  Renal/GU negative Renal ROS  negative genitourinary   Musculoskeletal negative musculoskeletal ROS (+)   Abdominal   Peds negative pediatric ROS (+)  Hematology negative hematology ROS (+)   Anesthesia Other Findings   Reproductive/Obstetrics negative OB ROS                           Anesthesia Physical Anesthesia Plan  ASA: III  Anesthesia Plan: General   Post-op Pain Management:    Induction: Intravenous and Rapid sequence  Airway Management Planned: Oral ETT  Additional Equipment:   Intra-op Plan:   Post-operative Plan: Extubation in OR  Informed Consent: I have reviewed the patients History and Physical, chart, labs and discussed the procedure including the risks, benefits and alternatives for the proposed anesthesia with the patient or authorized representative who has indicated his/her understanding and acceptance.   Dental advisory given  Plan Discussed with: CRNA, Surgeon and Anesthesiologist  Anesthesia Plan Comments:        Anesthesia Quick Evaluation

## 2014-10-21 NOTE — Interval H&P Note (Signed)
History and Physical Interval Note:  10/21/2014 10:44 AM  Shawn Patton  has presented today for surgery, with the diagnosis of Appendicitis with probable mucocele.  The various methods of treatment have been discussed with the patient and family. After consideration of risks, benefits and other options for treatment, the patient has consented to  Procedure(s): APPENDECTOMY LAPAROSCOPIC (N/A) as a surgical intervention .  The patient's history has been reviewed, patient examined, no change in status, stable for surgery.  I have reviewed the patient's chart and labs.  Questions were answered to the patient's satisfaction.     Weslie Rasmus

## 2014-10-21 NOTE — Anesthesia Procedure Notes (Signed)
Procedure Name: Intubation Date/Time: 10/21/2014 11:20 AM Performed by: Coralee RudFLORES, Nili Honda Pre-anesthesia Checklist: Patient identified, Emergency Drugs available, Suction available and Patient being monitored Patient Re-evaluated:Patient Re-evaluated prior to inductionOxygen Delivery Method: Circle system utilized Preoxygenation: Pre-oxygenation with 100% oxygen Intubation Type: IV induction Ventilation: Mask ventilation without difficulty Laryngoscope Size: Miller and 3 Grade View: Grade I Tube type: Oral Tube size: 7.5 mm Number of attempts: 1 Airway Equipment and Method: Stylet Placement Confirmation: ETT inserted through vocal cords under direct vision,  positive ETCO2 and breath sounds checked- equal and bilateral Secured at: 22 cm Tube secured with: Tape Dental Injury: Teeth and Oropharynx as per pre-operative assessment

## 2014-10-21 NOTE — Anesthesia Postprocedure Evaluation (Signed)
  Anesthesia Post-op Note  Patient: Shawn Patton  Procedure(s) Performed: Procedure(s): APPENDECTOMY LAPAROSCOPIC (N/A)  Patient Location: PACU  Anesthesia Type:General  Level of Consciousness: awake  Airway and Oxygen Therapy: Patient Spontanous Breathing  Post-op Pain: mild  Post-op Assessment: Post-op Vital signs reviewed  Post-op Vital Signs: Reviewed  Last Vitals:  Filed Vitals:   10/21/14 1355  BP: 103/37  Pulse: 45  Temp: 36.5 C  Resp: 16    Complications: No apparent anesthesia complications

## 2014-10-21 NOTE — Transfer of Care (Signed)
Immediate Anesthesia Transfer of Care Note  Patient: Shawn Patton  Procedure(s) Performed: Procedure(s): APPENDECTOMY LAPAROSCOPIC (N/A)  Patient Location: PACU  Anesthesia Type:General  Level of Consciousness: awake, alert  and sedated  Airway & Oxygen Therapy: Patient Spontanous Breathing and Patient connected to face mask oxygen  Post-op Assessment: Report given to RN, Post -op Vital signs reviewed and stable and Patient moving all extremities  Post vital signs: Reviewed and stable  Last Vitals:  Filed Vitals:   10/21/14 1019  BP: 119/69  Pulse: 52  Temp: 36.8 C  Resp: 16    Complications: No apparent anesthesia complications

## 2014-10-22 ENCOUNTER — Other Ambulatory Visit: Payer: Self-pay

## 2014-10-22 ENCOUNTER — Encounter (HOSPITAL_COMMUNITY): Payer: Self-pay | Admitting: General Surgery

## 2014-10-22 LAB — GLUCOSE, CAPILLARY
GLUCOSE-CAPILLARY: 119 mg/dL — AB (ref 65–99)
Glucose-Capillary: 137 mg/dL — ABNORMAL HIGH (ref 65–99)

## 2014-10-22 MED ORDER — OXYCODONE-ACETAMINOPHEN 5-325 MG PO TABS: 1.0000 | ORAL_TABLET | ORAL | Status: DC | PRN

## 2014-10-22 NOTE — Discharge Instructions (Signed)
[facility] ______[facility] °LAPAROSCOPIC SURGERY: POST OP INSTRUCTIONS °Always review your discharge instruction sheet given to you by the facility where your surgery was performed. °IF YOU HAVE DISABILITY OR FAMILY LEAVE FORMS, YOU MUST BRING THEM TO THE OFFICE FOR PROCESSING.   °DO NOT GIVE THEM TO YOUR DOCTOR. ° °1. A prescription for pain medication may be given to you upon discharge.  Take your pain medication as prescribed, if needed.  If narcotic pain medicine is not needed, then you may take acetaminophen (Tylenol) or ibuprofen (Advil) as needed. °2. Take your usually prescribed medications unless otherwise directed. °3. If you need a refill on your pain medication, please contact your pharmacy.  They will contact our office to request authorization. Prescriptions will not be filled after 5pm or on week-ends. °4. You should follow a light diet the first few days after arrival home, such as soup and crackers, etc.  Be sure to include lots of fluids daily. °5. Most patients will experience some swelling and bruising in the area of the incisions.  Ice packs will help.  Swelling and bruising can take several days to resolve.  °6. It is common to experience some constipation if taking pain medication after surgery.  Increasing fluid intake and taking a stool softener (such as Colace) will usually help or prevent this problem from occurring.  A mild laxative (Milk of Magnesia or Miralax) should be taken according to package instructions if there are no bowel movements after 48 hours. °7. Unless discharge instructions indicate otherwise, you may remove your bandages 24-48 hours after surgery, and you may shower at that time.  You may have steri-strips (small skin tapes) in place directly over the incision.  These strips should be left on the skin for 7-10 days.  If your surgeon used skin glue on the incision, you may shower in 24 hours.  The glue will flake off over the next 2-3 weeks.  Any sutures or  staples will be removed at the office during your follow-up visit. °8. ACTIVITIES:  You may resume regular (light) daily activities beginning the next day--such as daily self-care, walking, climbing stairs--gradually increasing activities as tolerated.  You may have sexual intercourse when it is comfortable.  Refrain from any heavy lifting or straining until approved by your doctor. °a. You may drive when you are no longer taking prescription pain medication, you can comfortably wear a seatbelt, and you can safely maneuver your car and apply brakes. °b. RETURN TO WORK:  __________________________________________________________ °9. You should see your doctor in the office for a follow-up appointment approximately 2-3 weeks after your surgery.  Make sure that you call for this appointment within a day or two after you arrive home to insure a convenient appointment time. °10. OTHER INSTRUCTIONS: __________________________________________________________________________________________________________________________ __________________________________________________________________________________________________________________________ °WHEN TO CALL YOUR DOCTOR: °1. Fever over 101.0 °2. Inability to urinate °3. Continued bleeding from incision. °4. Increased pain, redness, or drainage from the incision. °5. Increasing abdominal pain ° °The clinic staff is available to answer your questions during regular business hours.  Please don’t hesitate to call and ask to speak to one of the nurses for clinical concerns.  If you have a medical emergency, go to the nearest emergency room or call 911.  A surgeon from [facility] is always on call at the hospital. °[address], [address], [place], [place]  [address] ? [address] [address], , [place]   [address] °[phone] ? [phone] ? FAX [phone] °Web site:   [email or web address] °

## 2014-10-22 NOTE — Discharge Summary (Signed)
Patient ID: Shawn Patton MRN: [id number] DOB/AGE: 1955-10-09 59 y.o.  Admit date: 10/19/2014 Discharge date: 10/22/2014  Procedures: lap appy  Consults: None  Reason for Admission: This is a 59 yo [place] male who began having right sided abdominal pain on Friday. No emesis, but mild nausea. The patient does not speak English so his son interprets for him. He denies any diarrhea or fevers. He has not ate in several days, only drank. His pain worsened and so he called his PCP who sent him for a CT scan. This revealed acute perforated appendicitis with an abscess. He was sent to [facility] where we have been asked to see him. His WBC is normal with no abnormal labs.  Admission Diagnoses:  1. Acute perforated appendicitis with abscess  Hospital Course: the patient was admitted and initially planned for perc drain of what was felt to be an abscess; however, upon review with IR, this was felt not to be an abscess, but likely a mucocele or a very dilated appendix.  The decision was then made to take the patient to the operating room.  He underwent a lap appy.  The appendix was very dilated and appeared white.  When cut on the back table, no mucous was present.  This was sent to pathology.  On POD 1, he was tolerating a regular diet and had minimal pain.  He was stable for dc home.  PE: Abd: soft, appropriately tender, +BS, ND, incisions c/d/i  Discharge Diagnoses:  Principal Problem:   Acute appendicitis, await final pathology S/p lap appy   Discharge Medications:   Medication List    TAKE these medications        acetaminophen 650 MG CR tablet  Commonly known as:  TYLENOL 8 HOUR  Take 1 tablet (650 mg total) by mouth every 8 (eight) hours as needed for pain.     blood glucose meter kit and supplies  Monitor blood glucose twice daily before breakfast and supper     esomeprazole 40 MG capsule  Commonly known as:  NEXIUM  Take 1 capsule (40 mg total) by mouth daily before breakfast.     gabapentin 300 MG capsule  Commonly known as:  NEURONTIN  Take 2 capsules (600 mg total) by mouth at bedtime.     insulin aspart protamine- aspart (70-30) 100 UNIT/ML injection  Commonly known as:  NOVOLOG MIX 70/30  Inject 0.15 mLs (15 Units total) into the skin 2 (two) times daily with a meal.     Insulin Syringe-Needle U-100 29G X 5/16" 1 ML Misc  As directed     metFORMIN 500 MG 24 hr tablet  Commonly known as:  GLUCOPHAGE XR  Take 2 tablets (1,000 mg total) by mouth daily after supper.     oxyCODONE-acetaminophen 5-325 MG per tablet  Commonly known as:  PERCOCET/ROXICET  Take 1-2 tablets by mouth every 4 (four) hours as needed for moderate pain.        Discharge Instructions:     Follow-up Information    Follow up with [facility] On 11/10/2014.   Specialty:  General Surgery   Why:  [facility], 3:45pm, arrive no later than 3:15 pm for [facility]   Contact information:   [address] [place]  [address] [phone]       Signed: Henreitta Cea 10/22/2014, 9:35 AM

## 2015-10-14 ENCOUNTER — Telehealth: Payer: Self-pay | Admitting: Family Medicine

## 2015-10-14 DIAGNOSIS — K089 Disorder of teeth and supporting structures, unspecified: Secondary | ICD-10-CM

## 2015-10-14 NOTE — Telephone Encounter (Signed)
Dental referral placed.

## 2015-10-14 NOTE — Telephone Encounter (Signed)
Patient called and requested a referral to a dentist. Patient has the orange card. Please f/u at [phone]

## 2015-11-15 ENCOUNTER — Ambulatory Visit: Payer: Self-pay | Attending: Family Medicine | Admitting: Family Medicine

## 2015-11-15 VITALS — BP 156/79 | HR 67 | Temp 97.7°F | Resp 16 | Ht 64.0 in | Wt 147.0 lb

## 2015-11-15 DIAGNOSIS — R7989 Other specified abnormal findings of blood chemistry: Secondary | ICD-10-CM

## 2015-11-15 DIAGNOSIS — E1142 Type 2 diabetes mellitus with diabetic polyneuropathy: Secondary | ICD-10-CM

## 2015-11-15 DIAGNOSIS — Z1159 Encounter for screening for other viral diseases: Secondary | ICD-10-CM

## 2015-11-15 DIAGNOSIS — M1990 Unspecified osteoarthritis, unspecified site: Secondary | ICD-10-CM

## 2015-11-15 DIAGNOSIS — Z794 Long term (current) use of insulin: Secondary | ICD-10-CM

## 2015-11-15 LAB — POCT URINALYSIS DIPSTICK
Bilirubin, UA: NEGATIVE
GLUCOSE UA: 500
Ketones, UA: NEGATIVE
Leukocytes, UA: NEGATIVE
NITRITE UA: NEGATIVE
PH UA: 5
Protein, UA: NEGATIVE
RBC UA: NEGATIVE
Urobilinogen, UA: 0.2

## 2015-11-15 LAB — GLUCOSE, POCT (MANUAL RESULT ENTRY)

## 2015-11-15 LAB — LIPID PANEL
Cholesterol: 144 mg/dL (ref 125–200)
HDL: 40 mg/dL (ref >=40)
LDL Cholesterol: 88 mg/dL (ref <130)
Total CHOL/HDL Ratio: 3.6 Ratio (ref <=5.0)
Triglycerides: 78 mg/dL (ref <150)
VLDL: 16 mg/dL (ref <30)

## 2015-11-15 LAB — HEPATITIS C ANTIBODY: HCV Ab: NEGATIVE

## 2015-11-15 LAB — POCT GLYCOSYLATED HEMOGLOBIN (HGB A1C): HEMOGLOBIN A1C: 13

## 2015-11-15 MED ORDER — TRUE METRIX METER W/DEVICE KIT: 1.0000 | PACK | Status: AC | PRN

## 2015-11-15 MED ORDER — LOSARTAN POTASSIUM 25 MG PO TABS: 25.0000 mg | ORAL_TABLET | Freq: Every day | ORAL | Status: DC

## 2015-11-15 MED ORDER — METFORMIN HCL ER 500 MG PO TB24
1000.0000 mg | ORAL_TABLET | Freq: Every day | ORAL | Status: DC
Start: 2015-11-15 — End: 2016-01-18

## 2015-11-15 MED ORDER — INSULIN GLARGINE 100 UNIT/ML SOLOSTAR PEN: 10.0000 [IU] | PEN_INJECTOR | Freq: Every day | SUBCUTANEOUS | Status: DC

## 2015-11-15 MED ORDER — TRUEPLUS LANCETS 28G MISC: 1.0000 | Freq: Three times a day (TID) | Status: AC

## 2015-11-15 MED ORDER — GLUCOSE BLOOD VI STRP: 1.0000 | ORAL_STRIP | Freq: Three times a day (TID) | Status: AC

## 2015-11-15 MED ORDER — ACETAMINOPHEN ER 650 MG PO TBCR: 650.0000 mg | EXTENDED_RELEASE_TABLET | Freq: Three times a day (TID) | ORAL | Status: DC | PRN

## 2015-11-15 MED ORDER — INSULIN PEN NEEDLE 31G X 8 MM MISC: 1.0000 "application " | Freq: Every day | Status: DC

## 2015-11-15 MED ORDER — INSULIN ASPART 100 UNIT/ML ~~LOC~~ SOLN
20.0000 [IU] | Freq: Once | SUBCUTANEOUS | Status: AC
  Administered 2015-11-15: 20 [IU] via SUBCUTANEOUS

## 2015-11-15 MED ORDER — GABAPENTIN 100 MG PO CAPS: 100.0000 mg | ORAL_CAPSULE | Freq: Three times a day (TID) | ORAL | Status: DC

## 2015-11-15 NOTE — Patient Instructions (Addendum)
Raja was seen today for diabetes.  Diagnoses and all orders for this visit:  Type 2 diabetes mellitus with diabetic polyneuropathy, with long-term current use of insulin (HCC) -     HgB A1c -     Glucose (CBG) -     Microalbumin/Creatinine Ratio, Urine -     Lipid Panel -     POCT urinalysis dipstick -     Insulin Glargine (LANTUS SOLOSTAR) 100 UNIT/ML Solostar Pen; Inject 10 Units into the skin daily. -     metFORMIN (GLUCOPHAGE XR) 500 MG 24 hr tablet; Take 2 tablets (1,000 mg total) by mouth daily after supper. -     losartan (COZAAR) 25 MG tablet; Take 1 tablet (25 mg total) by mouth daily. -     COMPLETE METABOLIC PANEL WITH GFR -     gabapentin (NEURONTIN) 100 MG capsule; Take 1 capsule (100 mg total) by mouth 3 (three) times daily. -     insulin aspart (novoLOG) injection 20 Units; Inject 0.2 mLs (20 Units total) into the skin once. -     Insulin Pen Needle (B-D ULTRAFINE III SHORT PEN) 31G X 8 MM MISC; 1 application by Does not apply route daily. -     Blood Glucose Monitoring Suppl (TRUE METRIX METER) w/Device KIT; 1 each by Does not apply route as needed. -     glucose blood (TRUE METRIX BLOOD GLUCOSE TEST) test strip; 1 each by Other route 3 (three) times daily. -     TRUEPLUS LANCETS 28G MISC; 1 each by Does not apply route 3 (three) times daily.  Need for hepatitis C screening test -     Hepatitis C antibody, reflex  Abnormal TSH -     TSH -     T3, Free -     T4, free  Diabetic polyneuropathy associated with type 2 diabetes mellitus (HCC) -     gabapentin (NEURONTIN) 100 MG capsule; Take 1 capsule (100 mg total) by mouth 3 (three) times daily.  Osteoarthritis, unspecified osteoarthritis type, unspecified site -     acetaminophen (TYLENOL 8 HOUR) 650 MG CR tablet; Take 1 tablet (650 mg total) by mouth every 8 (eight) hours as needed for pain.   Check your blood sugar every morning before breakfast and write it down. This is the fasting sugar.   Diabetes blood sugar  goals  Fasting (in AM before breakfast, 8 hrs of no eating or drinking (except water or unsweetened coffee or tea): 90-110 2 hrs after meals: < 160,   No low sugars: nothing < 70   F/u in 2 weeks for diabetes   Dr. Adrian Blackwater

## 2015-11-15 NOTE — Progress Notes (Signed)
F/U DM  Taking medication daily  Stated taking 5 unit at night  Glucose been running elevated  Complaining of thirsty and frequency urinating  Burning on back Pain scale # 10 chest and arm pain

## 2015-11-15 NOTE — Progress Notes (Signed)
Subjective:  Patient ID: Shawn Patton, male    DOB: 05/09/1956  Age: 60 y.o. MRN: [id number]  Nepali interpreter [place] [id number] CC: No chief complaint on file.   HPI Haward Bumpus has uncontrolled HTN presents for a physical and to establish care  but due to hyperglycemia diabetes was addressed. He presents today with his son   1. CHRONIC DIABETES  Disease Monitoring  Blood Sugar Ranges: not checking   Polyuria: yes   Visual problems: yes   Medication Compliance: no  Taking 5 U of lantus at night only  Medication Side Effects  Hypoglycemia: no   Preventitive Health Care  Eye Exam: due   Foot Exam: done today     Social History  Substance Use Topics  . Smoking status: Never Smoker   . Smokeless tobacco: Never Used  . Alcohol Use: No       Outpatient Prescriptions Prior to Visit  Medication Sig Dispense Refill  . acetaminophen (TYLENOL 8 HOUR) 650 MG CR tablet Take 1 tablet (650 mg total) by mouth every 8 (eight) hours as needed for pain. 90 tablet 3  . Blood Glucose Monitoring Suppl (BLOOD GLUCOSE METER KIT AND SUPPLIES) Monitor blood glucose twice daily before breakfast and supper 1 each 11  . esomeprazole (NEXIUM) 40 MG capsule Take 1 capsule (40 mg total) by mouth daily before breakfast. 30 capsule 2  . gabapentin (NEURONTIN) 300 MG capsule Take 2 capsules (600 mg total) by mouth at bedtime. 30 capsule 3  . insulin aspart protamine- aspart (NOVOLOG MIX 70/30) (70-30) 100 UNIT/ML injection Inject 0.15 mLs (15 Units total) into the skin 2 (two) times daily with a meal. 3 vial 3  . Insulin Syringe-Needle U-100 29G X 5/16" 1 ML MISC As directed 100 each 11  . metFORMIN (GLUCOPHAGE XR) 500 MG 24 hr tablet Take 2 tablets (1,000 mg total) by mouth daily after supper. 60 tablet 11  . oxyCODONE-acetaminophen (PERCOCET/ROXICET) 5-325 MG per tablet Take 1-2 tablets by mouth every 4 (four) hours as needed for moderate pain. 30 tablet 0   Facility-Administered Medications Prior to Visit    Medication Dose Route Frequency Provider Last Rate Last Dose  . 0.9 %  sodium chloride infusion   Intravenous Once Halie Gass, MD        ROS Review of Systems  Constitutional: Negative for fever, chills, fatigue and unexpected weight change.  Eyes: Negative for visual disturbance.  Respiratory: Negative for cough and shortness of breath.   Cardiovascular: Negative for chest pain, palpitations and leg swelling.  Gastrointestinal: Negative for nausea, vomiting, abdominal pain, diarrhea, constipation and blood in stool.  Endocrine: Positive for polydipsia and polyuria. Negative for polyphagia.  Musculoskeletal: Positive for arthralgias (R wrist ). Negative for myalgias, back pain, gait problem and neck pain.  Skin: Negative for rash.  Allergic/Immunologic: Negative for immunocompromised state.  Neurological: Positive for numbness (in feet ).  Hematological: Negative for adenopathy. Does not bruise/bleed easily.  Psychiatric/Behavioral: Negative for suicidal ideas, sleep disturbance and dysphoric mood. The patient is not nervous/anxious.     Objective:  BP 156/79 mmHg  Pulse 67  Temp(Src) 97.7 F (36.5 C) (Oral)  Resp 16  Ht _0  (1.626 m)  Wt 147 lb (66.679 kg)  BMI 25.22 kg/m2  BP/Weight 11/15/2015 10/22/2014 09/18/8117  Systolic BP 147 829 -  Diastolic BP 79 63 -  Wt. (Lbs) 147 - 153.4  BMI 25.22 - -     Physical Exam  Constitutional: He appears well-developed and  well-nourished. No distress.  HENT:  Head: Normocephalic and atraumatic.  Neck: Normal range of motion. Neck supple.  Cardiovascular: Normal rate, regular rhythm, normal heart sounds and intact distal pulses.   Pulmonary/Chest: Effort normal and breath sounds normal.  Musculoskeletal: He exhibits no edema.  Neurological: He is alert.  Skin: Skin is warm and dry. No rash noted. No erythema.  Psychiatric: He has a normal mood and affect.    Lab Results  Component Value Date   HGBA1C 5.90 09/10/2014    Lab Results  Component Value Date   HGBA1C 13.0 11/15/2015    CBG -High  UA: neg ketones, 500 glucose   Treated with 20 U of novolog and 1 L NS  Repeat CBG 577  Assessment & Plan:   Antonis was seen today for diabetes.  Diagnoses and all orders for this visit:  Type 2 diabetes mellitus with diabetic polyneuropathy, with long-term current use of insulin (HCC) -     HgB A1c -     Glucose (CBG) -     Microalbumin/Creatinine Ratio, Urine -     Lipid Panel -     POCT urinalysis dipstick -     Insulin Glargine (LANTUS SOLOSTAR) 100 UNIT/ML Solostar Pen; Inject 10 Units into the skin daily. -     metFORMIN (GLUCOPHAGE XR) 500 MG 24 hr tablet; Take 2 tablets (1,000 mg total) by mouth daily after supper. -     losartan (COZAAR) 25 MG tablet; Take 1 tablet (25 mg total) by mouth daily. -     COMPLETE METABOLIC PANEL WITH GFR -     gabapentin (NEURONTIN) 100 MG capsule; Take 1 capsule (100 mg total) by mouth 3 (three) times daily. -     insulin aspart (novoLOG) injection 20 Units; Inject 0.2 mLs (20 Units total) into the skin once. -     Insulin Pen Needle (B-D ULTRAFINE III SHORT PEN) 31G X 8 MM MISC; 1 application by Does not apply route daily. -     Blood Glucose Monitoring Suppl (TRUE METRIX METER) w/Device KIT; 1 each by Does not apply route as needed. -     glucose blood (TRUE METRIX BLOOD GLUCOSE TEST) test strip; 1 each by Other route 3 (three) times daily. -     TRUEPLUS LANCETS 28G MISC; 1 each by Does not apply route 3 (three) times daily. -     POCT glucose (manual entry)  Need for hepatitis C screening test -     Hepatitis C antibody, reflex  Abnormal TSH -     TSH -     T3, Free -     T4, free  Diabetic polyneuropathy associated with type 2 diabetes mellitus (HCC) -     gabapentin (NEURONTIN) 100 MG capsule; Take 1 capsule (100 mg total) by mouth 3 (three) times daily.  Osteoarthritis, unspecified osteoarthritis type, unspecified site -     acetaminophen (TYLENOL 8  HOUR) 650 MG CR tablet; Take 1 tablet (650 mg total) by mouth every 8 (eight) hours as needed for pain.  Other orders -     Hepatitis C antibody   There are no diagnoses linked to this encounter.  No orders of the defined types were placed in this encounter.    Follow-up: No Follow-up on file.   Boykin Nearing MD

## 2015-11-16 LAB — COMPLETE METABOLIC PANEL WITH GFR
ALBUMIN: 4.3 g/dL (ref 3.6–5.1)
ALK PHOS: 87 U/L (ref 40–115)
ALT: 24 U/L (ref 9–46)
AST: 15 U/L (ref 10–35)
BUN: 18 mg/dL (ref 7–25)
CO2: 24 mmol/L (ref 20–31)
Calcium: 8.8 mg/dL (ref 8.6–10.3)
Chloride: 100 mmol/L (ref 98–110)
Creat: 0.9 mg/dL (ref 0.70–1.33)
GFR, Est African American: >89 mL/min (ref >=60)
GLUCOSE: 556 mg/dL — AB (ref 65–99)
Potassium: 4.2 mmol/L (ref 3.5–5.3)
SODIUM: 133 mmol/L — AB (ref 135–146)
Total Bilirubin: 0.7 mg/dL (ref 0.2–1.2)
Total Protein: 6.9 g/dL (ref 6.1–8.1)

## 2015-11-16 LAB — T3, FREE: T3, Free: 2.5 pg/mL (ref 2.3–4.2)

## 2015-11-16 LAB — TSH: TSH: 1 m[IU]/L (ref 0.40–4.50)

## 2015-11-16 LAB — MICROALBUMIN / CREATININE URINE RATIO: CREATININE, URINE: 21 mg/dL (ref 20–370)

## 2015-11-16 LAB — T4, FREE: Free T4: 1.1 ng/dL (ref 0.8–1.8)

## 2015-11-18 ENCOUNTER — Encounter: Payer: Self-pay | Admitting: Family Medicine

## 2015-11-18 NOTE — Assessment & Plan Note (Signed)
Uncontrolled diabetes with hyperglycemia due to med non compliance  Plan lantus 10 U daily Metformin 1000 mg XR BID Low carb diet Close f/u

## 2015-11-18 NOTE — Assessment & Plan Note (Signed)
Repeat TSH normal

## 2015-11-19 ENCOUNTER — Other Ambulatory Visit: Payer: Self-pay | Admitting: *Deleted

## 2015-11-19 DIAGNOSIS — E1142 Type 2 diabetes mellitus with diabetic polyneuropathy: Secondary | ICD-10-CM

## 2015-11-19 DIAGNOSIS — Z794 Long term (current) use of insulin: Principal | ICD-10-CM

## 2015-11-19 MED ORDER — INSULIN GLARGINE 100 UNIT/ML SOLOSTAR PEN: 10.0000 [IU] | PEN_INJECTOR | Freq: Every day | SUBCUTANEOUS | Status: DC

## 2016-01-18 ENCOUNTER — Ambulatory Visit: Payer: Self-pay | Attending: Family Medicine | Admitting: Family Medicine

## 2016-01-18 ENCOUNTER — Encounter: Payer: Self-pay | Admitting: Family Medicine

## 2016-01-18 VITALS — BP 141/80 | HR 59 | Temp 98.0°F | Ht 64.0 in | Wt 152.8 lb

## 2016-01-18 DIAGNOSIS — Z791 Long term (current) use of non-steroidal anti-inflammatories (NSAID): Secondary | ICD-10-CM | POA: Insufficient documentation

## 2016-01-18 DIAGNOSIS — A048 Other specified bacterial intestinal infections: Secondary | ICD-10-CM

## 2016-01-18 DIAGNOSIS — R109 Unspecified abdominal pain: Secondary | ICD-10-CM | POA: Insufficient documentation

## 2016-01-18 DIAGNOSIS — K219 Gastro-esophageal reflux disease without esophagitis: Secondary | ICD-10-CM | POA: Insufficient documentation

## 2016-01-18 DIAGNOSIS — B9681 Helicobacter pylori [H. pylori] as the cause of diseases classified elsewhere: Secondary | ICD-10-CM

## 2016-01-18 DIAGNOSIS — Z0000 Encounter for general adult medical examination without abnormal findings: Secondary | ICD-10-CM

## 2016-01-18 DIAGNOSIS — Z794 Long term (current) use of insulin: Secondary | ICD-10-CM | POA: Insufficient documentation

## 2016-01-18 DIAGNOSIS — E1142 Type 2 diabetes mellitus with diabetic polyneuropathy: Secondary | ICD-10-CM | POA: Insufficient documentation

## 2016-01-18 DIAGNOSIS — Z79899 Other long term (current) drug therapy: Secondary | ICD-10-CM | POA: Insufficient documentation

## 2016-01-18 LAB — GLUCOSE, POCT (MANUAL RESULT ENTRY): POC Glucose: 118 mg/dl — AB (ref 70–99)

## 2016-01-18 LAB — HEMOCCULT GUIAC POC 1CARD (OFFICE): FECAL OCCULT BLD: NEGATIVE

## 2016-01-18 LAB — POCT GLYCOSYLATED HEMOGLOBIN (HGB A1C): Hemoglobin A1C: 8.7

## 2016-01-18 MED ORDER — INSULIN GLARGINE 100 UNIT/ML SOLOSTAR PEN: 15.0000 [IU] | PEN_INJECTOR | Freq: Every day | SUBCUTANEOUS | 3 refills | Status: DC

## 2016-01-18 MED ORDER — GABAPENTIN 100 MG PO CAPS: 100.0000 mg | ORAL_CAPSULE | Freq: Three times a day (TID) | ORAL | 5 refills | Status: DC

## 2016-01-18 MED ORDER — LOSARTAN POTASSIUM 25 MG PO TABS: 25.0000 mg | ORAL_TABLET | Freq: Every day | ORAL | 5 refills | Status: DC

## 2016-01-18 MED ORDER — METFORMIN HCL ER 500 MG PO TB24: 1000.0000 mg | ORAL_TABLET | Freq: Every day | ORAL | 5 refills | Status: DC

## 2016-01-18 MED ORDER — OMEPRAZOLE 20 MG PO CPDR: 20.0000 mg | DELAYED_RELEASE_CAPSULE | Freq: Two times a day (BID) | ORAL | 0 refills | Status: DC

## 2016-01-18 MED ORDER — INSULIN GLARGINE 100 UNIT/ML SOLOSTAR PEN: 10.0000 [IU] | PEN_INJECTOR | Freq: Every day | SUBCUTANEOUS | 3 refills | Status: DC

## 2016-01-18 NOTE — Assessment & Plan Note (Signed)
Exam and hx consistent with GERD Heme neg stools  prilosec 20 mg BID for next 4 weeks H pylori testing done as well

## 2016-01-18 NOTE — Progress Notes (Signed)
Subjective:  Patient ID: Shawn Patton, male    DOB: 08-20-1955  Age: 60 y.o. MRN: [id number]  CC: Diabetes   HPI Shawn Patton presents for  1. Diabetes:  He is not checking sugars at home. He is taking lantus 10 U  and metformin. Denies subjective low CBGs.    2. Abdominal pain: associated with nausea, burping and chest pain presents for over 6 months but worsened for the  past 2-3 months worsening. No weight loss. No emesis. He endorses blood in stool.    Social History  Substance Use Topics  . Smoking status: Never Smoker  . Smokeless tobacco: Never Used  . Alcohol use No    Outpatient Medications Prior to Visit  Medication Sig Dispense Refill  . acetaminophen (TYLENOL 8 HOUR) 650 MG CR tablet Take 1 tablet (650 mg total) by mouth every 8 (eight) hours as needed for pain. 90 tablet 3  . Blood Glucose Monitoring Suppl (TRUE METRIX METER) w/Device KIT 1 each by Does not apply route as needed. 1 kit 0  . gabapentin (NEURONTIN) 100 MG capsule Take 1 capsule (100 mg total) by mouth 3 (three) times daily. 90 capsule 3  . glucose blood (TRUE METRIX BLOOD GLUCOSE TEST) test strip 1 each by Other route 3 (three) times daily. 100 each 11  . Insulin Glargine (LANTUS SOLOSTAR) 100 UNIT/ML Solostar Pen Inject 10 Units into the skin daily. 15 pen 3  . Insulin Pen Needle (B-D ULTRAFINE III SHORT PEN) 31G X 8 MM MISC 1 application by Does not apply route daily. 100 each 3  . losartan (COZAAR) 25 MG tablet Take 1 tablet (25 mg total) by mouth daily. 30 tablet 2  . metFORMIN (GLUCOPHAGE XR) 500 MG 24 hr tablet Take 2 tablets (1,000 mg total) by mouth daily after supper. 120 tablet 5  . TRUEPLUS LANCETS 28G MISC 1 each by Does not apply route 3 (three) times daily. 100 each 11   Facility-Administered Medications Prior to Visit  Medication Dose Route Frequency Provider Last Rate Last Dose  . 0.9 %  sodium chloride infusion   Intravenous Once Chaim Gatley, MD        ROS Review of Systems    Constitutional: Negative for chills, fatigue, fever and unexpected weight change.  Eyes: Negative for visual disturbance.  Respiratory: Negative for cough and shortness of breath.   Cardiovascular: Positive for chest pain. Negative for palpitations and leg swelling.  Gastrointestinal: Positive for abdominal pain and nausea. Negative for blood in stool, constipation, diarrhea and vomiting.  Endocrine: Positive for polydipsia and polyuria. Negative for polyphagia.  Musculoskeletal: Positive for arthralgias (R wrist ). Negative for back pain, gait problem, myalgias and neck pain.  Skin: Negative for rash.  Allergic/Immunologic: Negative for immunocompromised state.  Neurological: Positive for numbness (in feet ) and headaches.  Hematological: Negative for adenopathy. Does not bruise/bleed easily.  Psychiatric/Behavioral: Negative for dysphoric mood, sleep disturbance and suicidal ideas. The patient is not nervous/anxious.     Objective:  BP (!) 141/80 (BP Location: Right Arm, Patient Position: Sitting, Cuff Size: Large)   Pulse (!) 59   Temp 98 F (36.7 C) (Oral)   Ht 5' 4" (1.626 m)   Wt 152 lb 12.8 oz (69.3 kg)   SpO2 97%   BMI 26.23 kg/m   BP/Weight 01/18/2016 11/15/2015 3/81/8299  Systolic BP 371 696 789  Diastolic BP 80 79 63  Wt. (Lbs) 152.8 147 -  BMI 26.23 25.22 -   Wt Readings  from Last 3 Encounters:  01/18/16 152 lb 12.8 oz (69.3 kg)  11/15/15 147 lb (66.7 kg)  10/20/14 153 lb 6.4 oz (69.6 kg)    Physical Exam  Constitutional: He appears well-developed and well-nourished. No distress.  HENT:  Head: Normocephalic and atraumatic.  Neck: Normal range of motion. Neck supple.  Cardiovascular: Normal rate, regular rhythm, normal heart sounds and intact distal pulses.   Pulmonary/Chest: Effort normal and breath sounds normal.  Genitourinary: Rectal exam shows anal tone normal and guaiac negative stool.  Musculoskeletal: He exhibits no edema.  Neurological: He is alert.   Skin: Skin is warm and dry. No rash noted. No erythema.  Psychiatric: He has a normal mood and affect.    Lab Results  Component Value Date   HGBA1C 13.0 11/15/2015   Lab Results  Component Value Date   HGBA1C 8.7 01/18/2016    CBG 118 Assessment & Plan:   Shawn Patton was seen today for diabetes.  Diagnoses and all orders for this visit:  Type 2 diabetes mellitus with diabetic polyneuropathy, with long-term current use of insulin (HCC) -     HgB A1c -     Glucose (CBG) -     losartan (COZAAR) 25 MG tablet; Take 1 tablet (25 mg total) by mouth daily. -     metFORMIN (GLUCOPHAGE XR) 500 MG 24 hr tablet; Take 2 tablets (1,000 mg total) by mouth daily after supper. -     Discontinue: Insulin Glargine (LANTUS SOLOSTAR) 100 UNIT/ML Solostar Pen; Inject 10 Units into the skin daily. -     gabapentin (NEURONTIN) 100 MG capsule; Take 1 capsule (100 mg total) by mouth 3 (three) times daily. -     Insulin Glargine (LANTUS SOLOSTAR) 100 UNIT/ML Solostar Pen; Inject 15 Units into the skin daily.  Diabetic polyneuropathy associated with type 2 diabetes mellitus (HCC) -     gabapentin (NEURONTIN) 100 MG capsule; Take 1 capsule (100 mg total) by mouth 3 (three) times daily.  Gastroesophageal reflux disease, esophagitis presence not specified -     H. pylori breath test -     POCT occult blood stool -     omeprazole (PRILOSEC) 20 MG capsule; Take 1 capsule (20 mg total) by mouth 2 (two) times daily before a meal.  Healthcare maintenance -     Ambulatory referral to Gastroenterology    No orders of the defined types were placed in this encounter.   Follow-up: Return in about 8 weeks (around 03/14/2016) for flu shot and f/u GERD .   Shawn Funches MD   

## 2016-01-18 NOTE — Patient Instructions (Addendum)
Shawn Patton was seen today for diabetes.  Diagnoses and all orders for this visit:  Type 2 diabetes mellitus with diabetic polyneuropathy, with long-term current use of insulin (HCC) -     HgB A1c -     Glucose (CBG) -     losartan (COZAAR) 25 MG tablet; Take 1 tablet (25 mg total) by mouth daily. -     metFORMIN (GLUCOPHAGE XR) 500 MG 24 hr tablet; Take 2 tablets (1,000 mg total) by mouth daily after supper. -     Discontinue: Insulin Glargine (LANTUS SOLOSTAR) 100 UNIT/ML Solostar Pen; Inject 10 Units into the skin daily. -     gabapentin (NEURONTIN) 100 MG capsule; Take 1 capsule (100 mg total) by mouth 3 (three) times daily. -     Insulin Glargine (LANTUS SOLOSTAR) 100 UNIT/ML Solostar Pen; Inject 15 Units into the skin daily.  Diabetic polyneuropathy associated with type 2 diabetes mellitus (HCC) -     gabapentin (NEURONTIN) 100 MG capsule; Take 1 capsule (100 mg total) by mouth 3 (three) times daily.  Gastroesophageal reflux disease, esophagitis presence not specified -     H. pylori breath test -     POCT occult blood stool -     omeprazole (PRILOSEC) 20 MG capsule; Take 1 capsule (20 mg total) by mouth 2 (two) times daily before a meal.  Healthcare maintenance -     Ambulatory referral to Gastroenterology  your diabetes is improving, well done. Increase lantus to 15 U daily   Diabetes blood sugar goals  Fasting (in AM before breakfast, 8 hrs of no eating or drinking (except water or unsweetened coffee or tea): 90-110 2 hrs after meals: < 160,   No low sugars: nothing < 70   You will be called with H pylori results  F/u in 8 weeks for flu shot and f/u GERD  Dr. Armen PickupFunches

## 2016-01-18 NOTE — Assessment & Plan Note (Signed)
Improved Increase lantus to 15 U daily Continue metformin 1000 mg XR daily

## 2016-01-18 NOTE — Progress Notes (Signed)
C/C:headache ,chest pains and burning in abdomen, patent states that these pains have been going on for months now.mira

## 2016-01-19 DIAGNOSIS — A048 Other specified bacterial intestinal infections: Secondary | ICD-10-CM | POA: Insufficient documentation

## 2016-01-19 LAB — H. PYLORI BREATH TEST: H. pylori Breath Test: DETECTED — AB

## 2016-01-19 MED ORDER — AMOXICILLIN 500 MG PO CAPS: 1000.0000 mg | ORAL_CAPSULE | Freq: Two times a day (BID) | ORAL | 0 refills | Status: AC

## 2016-01-19 MED ORDER — CLARITHROMYCIN 500 MG PO TABS: 500.0000 mg | ORAL_TABLET | Freq: Two times a day (BID) | ORAL | 0 refills | Status: AC

## 2016-01-19 NOTE — Addendum Note (Signed)
Addended by: Dessa PhiFUNCHES, Davarious Tumbleson on: 01/19/2016 01:49 PM   Modules accepted: Orders

## 2016-01-20 ENCOUNTER — Telehealth: Payer: Self-pay

## 2016-01-20 NOTE — Telephone Encounter (Signed)
Patient has a voicemail that is not set up. 

## 2016-01-31 NOTE — Telephone Encounter (Signed)
Patients results were mailed to him on 8/21

## 2016-01-31 NOTE — Progress Notes (Signed)
Call placed to [employer] [phone] Interpreter Theron Aristaeter ID [id number] assisted w/ call translation. Call placed to patient at [phone], Left voicemail requested return call to [phone].  If patient calls please advise patient per Dr. Armen PickupFunches: H pylori positive-H. Pylori is a bacteria that causes gastritis, inflammation in the stomach.  Along with prilosec please take 2 antibiotics for the next 10 days  Amoxicillin 1000 mg BID for 10 days and clarithromycin 500 mg twice daily for 10 days

## 2016-02-22 ENCOUNTER — Other Ambulatory Visit: Payer: Self-pay | Admitting: Family Medicine

## 2016-02-22 DIAGNOSIS — K219 Gastro-esophageal reflux disease without esophagitis: Secondary | ICD-10-CM

## 2016-02-24 ENCOUNTER — Encounter: Payer: Self-pay | Admitting: Family Medicine

## 2016-03-14 ENCOUNTER — Ambulatory Visit: Payer: Self-pay | Attending: Family Medicine | Admitting: Family Medicine

## 2016-03-14 ENCOUNTER — Encounter: Payer: Self-pay | Admitting: Family Medicine

## 2016-03-14 ENCOUNTER — Telehealth: Payer: Self-pay | Admitting: Family Medicine

## 2016-03-14 VITALS — BP 150/82 | HR 60 | Temp 98.4°F | Ht 64.0 in | Wt 159.0 lb

## 2016-03-14 DIAGNOSIS — Z79899 Other long term (current) drug therapy: Secondary | ICD-10-CM | POA: Insufficient documentation

## 2016-03-14 DIAGNOSIS — I10 Essential (primary) hypertension: Secondary | ICD-10-CM | POA: Insufficient documentation

## 2016-03-14 DIAGNOSIS — Z794 Long term (current) use of insulin: Secondary | ICD-10-CM | POA: Insufficient documentation

## 2016-03-14 DIAGNOSIS — R079 Chest pain, unspecified: Secondary | ICD-10-CM

## 2016-03-14 DIAGNOSIS — E1142 Type 2 diabetes mellitus with diabetic polyneuropathy: Secondary | ICD-10-CM | POA: Insufficient documentation

## 2016-03-14 DIAGNOSIS — R0789 Other chest pain: Secondary | ICD-10-CM | POA: Insufficient documentation

## 2016-03-14 DIAGNOSIS — Z23 Encounter for immunization: Secondary | ICD-10-CM | POA: Insufficient documentation

## 2016-03-14 LAB — CBC
HEMATOCRIT: 44.3 % (ref 38.5–50.0)
HEMOGLOBIN: 15.5 g/dL (ref 13.2–17.1)
MCH: 31.1 pg (ref 27.0–33.0)
MCHC: 35 g/dL (ref 32.0–36.0)
MCV: 89 fL (ref 80.0–100.0)
MPV: 11.2 fL (ref 7.5–12.5)
Platelets: 169 10*3/uL (ref 140–400)
RBC: 4.98 MIL/uL (ref 4.20–5.80)
RDW: 13.5 % (ref 11.0–15.0)
WBC: 5.6 10*3/uL (ref 3.8–10.8)

## 2016-03-14 LAB — GLUCOSE, POCT (MANUAL RESULT ENTRY): POC GLUCOSE: 154 mg/dL — AB (ref 70–99)

## 2016-03-14 MED ORDER — LOSARTAN POTASSIUM 50 MG PO TABS: 50.0000 mg | ORAL_TABLET | Freq: Every day | ORAL | 5 refills | Status: DC

## 2016-03-14 MED ORDER — GABAPENTIN 100 MG PO CAPS: 200.0000 mg | ORAL_CAPSULE | Freq: Three times a day (TID) | ORAL | 5 refills | Status: DC

## 2016-03-14 NOTE — Patient Instructions (Addendum)
Makye was seen today for follow-up.  Diagnoses and all orders for this visit:  Type 2 diabetes mellitus with diabetic polyneuropathy, with long-term current use of insulin (HCC) -     POCT glucose (manual entry) -     losartan (COZAAR) 50 MG tablet; Take 1 tablet (50 mg total) by mouth daily. -     gabapentin (NEURONTIN) 100 MG capsule; Take 2 capsules (200 mg total) by mouth 3 (three) times daily.  Right-sided chest pain -     CBC -     COMPLETE METABOLIC PANEL WITH GFR -     Lipase -     DG Chest 2 View; Future -     US Abdomen Complete; Future  Diabetic polyneuropathy associated with type 2 diabetes mellitus (HCC) -     gabapentin (NEURONTIN) 100 MG capsule; Take 2 capsules (200 mg total) by mouth 3 (three) times daily.    F/u in 1 month for diabetes   Dr. Armen PickupFunches

## 2016-03-14 NOTE — Telephone Encounter (Signed)
Please call patient He left form [phone] (Medical Certification for Disability Exception) for [place] [employer].   However, he has no qualifying disability or impairment  to obtain and exception. I cannot fill out the form at this time. He is asked to schedule a visit and bring a new form,  specifically to discuss the form if he feels he does have a disability or impairment.  Diabetes does not qualify.

## 2016-03-14 NOTE — Progress Notes (Signed)
Subjective:  Patient ID: Shawn Patton, male    DOB: Nov 19, 1955  Age: 60 y.o. MRN: [id number]  CC: Follow-up   HPI Shawn Patton had diabetes, HTN he presents for  1. Diabetes: He is not checking sugars at home. He is taking lantus 15 U  and 1000 mg metformin. Denies subjective low CBGs.    2. Chest pain: R sided sternal pain with burning sensation in back. Also with RUQ pains. Prilosec has not helped with this pain.  His H. Pylori breath test was positive at his last office visit. He was prescribed triple therapy: amoxicillin, clarithromycin and Prilosec. He has some nausea and emesis at times. He denies shortness of breath. No fever, chills or weight loss.   3. HTN: taking losartan 25 mg daily.  Some CP. No SOB or peripheral edema.   Social History  Substance Use Topics  . Smoking status: Never Smoker  . Smokeless tobacco: Never Used  . Alcohol use No   Past Surgical History:  Procedure Laterality Date  . LAPAROSCOPIC APPENDECTOMY N/A 10/21/2014   Procedure: APPENDECTOMY LAPAROSCOPIC;  Surgeon: Shawn Klein, MD;  Location: [facility];  Service: General;  Laterality: N/A;  . STOMACH SURGERY  2012   Outpatient Medications Prior to Visit  Medication Sig Dispense Refill  . acetaminophen (TYLENOL 8 HOUR) 650 MG CR tablet Take 1 tablet (650 mg total) by mouth every 8 (eight) hours as needed for pain. 90 tablet 3  . Blood Glucose Monitoring Suppl (TRUE METRIX METER) w/Device KIT 1 each by Does not apply route as needed. 1 kit 0  . gabapentin (NEURONTIN) 100 MG capsule Take 1 capsule (100 mg total) by mouth 3 (three) times daily. 90 capsule 5  . glucose blood (TRUE METRIX BLOOD GLUCOSE TEST) test strip 1 each by Other route 3 (three) times daily. 100 each 11  . Insulin Glargine (LANTUS SOLOSTAR) 100 UNIT/ML Solostar Pen Inject 15 Units into the skin daily. 15 pen 3  . Insulin Pen Needle (B-D ULTRAFINE III SHORT PEN) 31G X 8 MM MISC 1 application by Does not apply route daily. 100 each 3  . losartan  (COZAAR) 25 MG tablet Take 1 tablet (25 mg total) by mouth daily. 30 tablet 5  . metFORMIN (GLUCOPHAGE XR) 500 MG 24 hr tablet Take 2 tablets (1,000 mg total) by mouth daily after supper. 69 tablet 5  . omeprazole (PRILOSEC) 20 MG capsule TAKE 1 CAPSULE BY MOUTH 2 TIMES DAILY BEFORE A MEAL. 60 capsule 0  . TRUEPLUS LANCETS 28G MISC 1 each by Does not apply route 3 (three) times daily. 100 each 11   Facility-Administered Medications Prior to Visit  Medication Dose Route Frequency Provider Last Rate Last Dose  . 0.9 %  sodium chloride infusion   Intravenous Once Shawn Drolet, MD        ROS Review of Systems  Constitutional: Negative for chills, fatigue, fever and unexpected weight change.  Eyes: Negative for visual disturbance.  Respiratory: Negative for cough and shortness of breath.   Cardiovascular: Positive for chest pain. Negative for palpitations and leg swelling.  Gastrointestinal: Positive for abdominal pain and nausea. Negative for blood in stool, constipation, diarrhea and vomiting.  Endocrine: Positive for polydipsia and polyuria. Negative for polyphagia.  Musculoskeletal: Positive for arthralgias (R wrist ). Negative for back pain, gait problem, myalgias and neck pain.  Skin: Negative for rash.  Allergic/Immunologic: Negative for immunocompromised state.  Neurological: Positive for numbness (in feet ) and headaches.  Hematological: Negative for  adenopathy. Does not bruise/bleed easily.  Psychiatric/Behavioral: Negative for dysphoric mood, sleep disturbance and suicidal ideas. The patient is not nervous/anxious.     Objective:  BP (!) 150/82 (BP Location: Right Arm, Patient Position: Sitting, Cuff Size: Small)   Pulse 60   Temp 98.4 F (36.9 C) (Oral)   Ht 5' 4"  (1.626 m)   Wt 159 lb (72.1 kg)   SpO2 98%   BMI 27.29 kg/m   BP/Weight 03/14/2016 08/12/9922 07/19/8339  Systolic BP 962 229 798  Diastolic BP 82 80 79  Wt. (Lbs) 159 152.8 147  BMI 27.29 26.23 25.22   Wt  Readings from Last 3 Encounters:  03/14/16 159 lb (72.1 kg)  01/18/16 152 lb 12.8 oz (69.3 kg)  11/15/15 147 lb (66.7 kg)    Physical Exam  Constitutional: He appears well-developed and well-nourished. No distress.  HENT:  Head: Normocephalic and atraumatic.  Neck: Normal range of motion. Neck supple.  Cardiovascular: Normal rate, regular rhythm, normal heart sounds and intact distal pulses.   Pulmonary/Chest: Effort normal and breath sounds normal.  Abdominal: Soft. Bowel sounds are normal. He exhibits no distension and no mass. There is no tenderness. There is no rebound and no guarding.  Genitourinary: Rectal exam shows anal tone normal and guaiac negative stool.  Musculoskeletal: He exhibits no edema.  Neurological: He is alert.  Skin: Skin is warm and dry. No rash noted. No erythema.  Psychiatric: He has a normal mood and affect.    Lab Results  Component Value Date   HGBA1C 8.7 01/18/2016   CBG 154 Assessment & Plan:   [name] was seen today for follow-up.  Diagnoses and all orders for this visit:  Type 2 diabetes mellitus with diabetic polyneuropathy, with long-term current use of insulin (HCC) -     POCT glucose (manual entry) -     losartan (COZAAR) 50 MG tablet; Take 1 tablet (50 mg total) by mouth daily. -     gabapentin (NEURONTIN) 100 MG capsule; Take 2 capsules (200 mg total) by mouth 3 (three) times daily.  Right-sided chest pain -     CBC -     COMPLETE METABOLIC PANEL WITH GFR -     Lipase -     DG Chest 2 View; Future -     US Abdomen Complete; Future  Diabetic polyneuropathy associated with type 2 diabetes mellitus (HCC) -     gabapentin (NEURONTIN) 100 MG capsule; Take 2 capsules (200 mg total) by mouth 3 (three) times daily.  Encounter for immunization -     Flu Vaccine QUAD 36+ mos IM  Hypertension, unspecified type    Meds ordered this encounter  Medications  . losartan (COZAAR) 50 MG tablet    Sig: Take 1 tablet (50 mg total) by mouth  daily.    Dispense:  30 tablet    Refill:  5  . gabapentin (NEURONTIN) 100 MG capsule    Sig: Take 2 capsules (200 mg total) by mouth 3 (three) times daily.    Dispense:  180 capsule    Refill:  5    Follow-up: Return in about 4 weeks (around 04/11/2016) for diabetes .   Boykin Nearing MD

## 2016-03-14 NOTE — Progress Notes (Signed)
Pt is having pains in chest and in ears.  Pt is getting flu shot today.

## 2016-03-15 DIAGNOSIS — R079 Chest pain, unspecified: Secondary | ICD-10-CM | POA: Insufficient documentation

## 2016-03-15 DIAGNOSIS — I10 Essential (primary) hypertension: Secondary | ICD-10-CM | POA: Insufficient documentation

## 2016-03-15 LAB — COMPLETE METABOLIC PANEL WITH GFR
ALBUMIN: 4.6 g/dL (ref 3.6–5.1)
ALT: 41 U/L (ref 9–46)
AST: 29 U/L (ref 10–35)
Alkaline Phosphatase: 69 U/L (ref 40–115)
BUN: 10 mg/dL (ref 7–25)
CALCIUM: 10 mg/dL (ref 8.6–10.3)
CO2: 29 mmol/L (ref 20–31)
CREATININE: 0.96 mg/dL (ref 0.70–1.33)
Chloride: 104 mmol/L (ref 98–110)
GFR, Est African American: >89 mL/min (ref >=60)
GFR, Est Non African American: 86 mL/min (ref >=60)
GLUCOSE: 119 mg/dL — AB (ref 65–99)
Potassium: 4.3 mmol/L (ref 3.5–5.3)
Sodium: 141 mmol/L (ref 135–146)
Total Bilirubin: 0.6 mg/dL (ref 0.2–1.2)
Total Protein: 7.7 g/dL (ref 6.1–8.1)

## 2016-03-15 LAB — LIPASE: LIPASE: 12 U/L (ref 7–60)

## 2016-03-15 NOTE — Assessment & Plan Note (Signed)
A; improved with better controlled CBGs P: Continue lantus 15 U daily Continue metformin F/u in one month for repeat A1c

## 2016-03-15 NOTE — Assessment & Plan Note (Signed)
A: R sided chest pains patient with HTN and DM, H. Pylori breath test + at last OV. DDx biliary colic, GERD/esophagitis  P: CXR Abdominal ultrasound Treat HTN

## 2016-03-15 NOTE — Assessment & Plan Note (Signed)
A; HTN Med: taking losartan 25 mg daily P: Increase dose to  50 mg daily

## 2016-03-21 ENCOUNTER — Ambulatory Visit (HOSPITAL_COMMUNITY): Admission: RE | Admit: 2016-03-21 | Payer: Self-pay | Source: Ambulatory Visit

## 2016-03-23 ENCOUNTER — Ambulatory Visit (HOSPITAL_COMMUNITY)
Admission: RE | Admit: 2016-03-23 | Discharge: 2016-03-23 | Disposition: A | Discharge status: Home / Self Care | Payer: Self-pay | Source: Ambulatory Visit | Attending: Family Medicine | Admitting: Family Medicine

## 2016-03-23 DIAGNOSIS — R932 Abnormal findings on diagnostic imaging of liver and biliary tract: Secondary | ICD-10-CM | POA: Insufficient documentation

## 2016-03-23 DIAGNOSIS — R079 Chest pain, unspecified: Secondary | ICD-10-CM | POA: Insufficient documentation

## 2016-03-23 DIAGNOSIS — I77811 Abdominal aortic ectasia: Secondary | ICD-10-CM | POA: Insufficient documentation

## 2016-03-28 NOTE — Telephone Encounter (Signed)
Call placed to [employer] rep ID [id number] "Byrd HesselbachMaria" assisted.   Received message "no voicemail has been set up yet" and no voicemail option.  Letter generated

## 2016-04-11 ENCOUNTER — Encounter: Payer: Self-pay | Admitting: Licensed Clinical Social Worker

## 2016-04-11 ENCOUNTER — Other Ambulatory Visit: Payer: Self-pay

## 2016-04-11 ENCOUNTER — Ambulatory Visit: Payer: Self-pay | Attending: Family Medicine | Admitting: Family Medicine

## 2016-04-11 VITALS — BP 146/83 | HR 66 | Temp 98.4°F | Ht 64.0 in | Wt 159.6 lb

## 2016-04-11 DIAGNOSIS — Z79899 Other long term (current) drug therapy: Secondary | ICD-10-CM | POA: Insufficient documentation

## 2016-04-11 DIAGNOSIS — R079 Chest pain, unspecified: Secondary | ICD-10-CM | POA: Insufficient documentation

## 2016-04-11 DIAGNOSIS — Z794 Long term (current) use of insulin: Secondary | ICD-10-CM | POA: Insufficient documentation

## 2016-04-11 DIAGNOSIS — H6121 Impacted cerumen, right ear: Secondary | ICD-10-CM | POA: Insufficient documentation

## 2016-04-11 DIAGNOSIS — E1142 Type 2 diabetes mellitus with diabetic polyneuropathy: Secondary | ICD-10-CM | POA: Insufficient documentation

## 2016-04-11 DIAGNOSIS — R200 Anesthesia of skin: Secondary | ICD-10-CM | POA: Insufficient documentation

## 2016-04-11 DIAGNOSIS — R4589 Other symptoms and signs involving emotional state: Secondary | ICD-10-CM

## 2016-04-11 DIAGNOSIS — Z23 Encounter for immunization: Secondary | ICD-10-CM

## 2016-04-11 DIAGNOSIS — G8929 Other chronic pain: Secondary | ICD-10-CM | POA: Insufficient documentation

## 2016-04-11 DIAGNOSIS — F329 Major depressive disorder, single episode, unspecified: Secondary | ICD-10-CM | POA: Insufficient documentation

## 2016-04-11 DIAGNOSIS — I10 Essential (primary) hypertension: Secondary | ICD-10-CM | POA: Insufficient documentation

## 2016-04-11 LAB — GLUCOSE, POCT (MANUAL RESULT ENTRY): POC GLUCOSE: 156 mg/dL — AB (ref 70–99)

## 2016-04-11 MED ORDER — TRAZODONE HCL 50 MG PO TABS: 25.0000 mg | ORAL_TABLET | Freq: Every evening | ORAL | 3 refills | Status: DC | PRN

## 2016-04-11 MED ORDER — PREGABALIN 50 MG PO CAPS: 50.0000 mg | ORAL_CAPSULE | Freq: Three times a day (TID) | ORAL | 2 refills | Status: DC

## 2016-04-11 MED ORDER — GABAPENTIN 300 MG PO CAPS: 300.0000 mg | ORAL_CAPSULE | Freq: Three times a day (TID) | ORAL | 2 refills | Status: DC

## 2016-04-11 NOTE — Patient Instructions (Addendum)
Shawn Patton was seen today for diabetes.  Diagnoses and all orders for this visit:  Type 2 diabetes mellitus with diabetic polyneuropathy, with long-term current use of insulin (HCC) -     POCT glucose (manual entry) -     gabapentin (NEURONTIN) 300 MG capsule; Take 1 capsule (300 mg total) by mouth 3 (three) times daily.  Diabetic polyneuropathy associated with type 2 diabetes mellitus (HCC) -     gabapentin (NEURONTIN) 300 MG capsule; Take 1 capsule (300 mg total) by mouth 3 (three) times daily.  Depressed mood -     traZODone (DESYREL) 50 MG tablet; Take 0.5-1 tablets (25-50 mg total) by mouth at bedtime as needed for sleep.  Right-sided chest pain -     EKG 12-Lead  Impacted cerumen of right ear  Other orders -     Tdap vaccine greater than or equal to 7yo IM -     pregabalin (LYRICA) 50 MG capsule; Take 1 capsule (50 mg total) by mouth 3 (three) times daily. For PASS    F/u in  4 weeks for depression and neuropathy   Dr. Armen PickupFunches

## 2016-04-11 NOTE — BH Specialist Note (Signed)
Session Start time: 4:33 pm   End Time: 5:06  Total Time:  39 minutes Type of Service: Behavioral Health - Individual/Family Interpreter: Yes.     Interpreter Name & Language: Audery AmelHari [phone]([id number]) Nepali # [facility]   SUBJECTIVE: Shawn MartinezDhan Patton is a 60 y.o. male  Pt. was referred by Dr. Armen PickupFunches for:  anxiety and depression. Pt. reports the following symptoms/concerns: difficulty sleeping, racing thoughts, and suicidal ideations Duration of problem:  Approx 5 years ago. Pt reported that onset of symptoms began soon after moving to [place] and being diagnosed with Diabetes Severity: severe Previous treatment: No previous treatment reported   OBJECTIVE: Mood: Pleasant & Affect: Appropriate Risk of harm to self or others: Pt denied current SI/HI Assessments administered: PHQ-9; GAD-7  LIFE CONTEXT:  Family & Social: Pt resides with two adult sons (one present during visit). His support consists of adult sons, nephews, and cousins. Pt has friends and neighbors who he receives emotional support from. School/ Work: Pt is unemployed and receives [employer]. Adult sons provide financial support Self-Care: Pt has difficulty sleeping. He tends to his small garden at residence to cope with depression and anxiety Life changes: Pt is financially dependent on adult children What is important to pt/family (values): Family   GOALS ADDRESSED:  Decrease symptoms of depression Decrease symptoms of anxiety  INTERVENTIONS: Solution Focused, Strength-based and Supportive   ASSESSMENT:  Pt currently experiencing depression and anxiety triggered by guilt that adult children are paying for his medical needs. Pt reports difficulty sleeping, racing thoughts, and suicidal ideations. Pt may benefit from psychoeducation, psychotherapy, and medication management. LCSWA educated pt on the cycle of depression and anxiety. LCSWA inquired protective factors with pt. Pt reported that he does not want to  harm self due to the love for his adult sons. Pt reports that when he experiences SI he "steps back and thinks that's not good". Pt's adult son disclosed that pt is never left alone at the house, while at work family will stay with pt. LCSWA discussed a safety plan with family and provided resources for crisis interventions. LCSWA educated pt on the benefits of [facility], pt agreed to schedule appointment.      PLAN: 1. F/U with behavioral health clinician: Pt was encouraged to contact LCSWA and crisis resources if symptoms worsen or fail to improve. Provided pt with community resources for medication management. [facility]. 2. Behavioral Health meds: None reported 3. Behavioral recommendations: Provided pt with community resources for medication management. [facility]. 4. Referral: Brief Counseling/Psychotherapy, [facility], Psychoeducation and Supportive Counseling 5. From scale of 1-10, how likely are you to follow plan: 7/10   Bridgett LarssonJasmine D Lewis, MSW, LCSWA  Clinical Social Worker 04/12/16 3:12 PM  Warmhandoff:   Warm Hand Off Completed.

## 2016-04-11 NOTE — Progress Notes (Signed)
Pt is here for diabetes and for paperwork.  Pt is having chest, leg and back pain.(pain level 6) Pt right ear is bleeding. Pt is getting tdap shot today.

## 2016-04-11 NOTE — Progress Notes (Signed)
Subjective:  Patient ID: Shawn Patton, male    DOB: [birth year]  Age: 60 y.o. MRN: [id number]  CC: Diabetes   HPI Shawn Patton had diabetes, HTN he presents with his son for  1. Diabetes: He is not checking sugars at home. He is taking lantus 15 U  and 1000 mg metformin. Denies subjective low CBGs.    2. Chest pain: R sided sternal pain with burning sensation in back. Also with RUQ pains. Prilosec has not helped with this pain.  His H. Pylori breath test was positive at his last office visit. He was prescribed triple therapy: amoxicillin, clarithromycin and Prilosec. He has some nausea and emesis at times. He denies shortness of breath. No fever, chills or weight loss. He completed abdominal ultrasound that was negative for acute findings.   3. HTN: taking losartan 25 mg daily.  Some CP. No SOB or peripheral edema.   4. Depression: reports depressed mood with suicidal thoughts. He is depressed because of chronic pain, in chest, back and legs. Numbness in feet. He is unable to work and provide for his family like he used to. He has trouble sleeping.   Social History  Substance Use Topics  . Smoking status: Never Smoker  . Smokeless tobacco: Never Used  . Alcohol use No   Past Surgical History:  Procedure Laterality Date  . LAPAROSCOPIC APPENDECTOMY N/A 10/21/2014   Procedure: APPENDECTOMY LAPAROSCOPIC;  Surgeon: Stark Klein, MD;  Location: [facility];  Service: General;  Laterality: N/A;  . STOMACH SURGERY  2012   Outpatient Medications Prior to Visit  Medication Sig Dispense Refill  . acetaminophen (TYLENOL 8 HOUR) 650 MG CR tablet Take 1 tablet (650 mg total) by mouth every 8 (eight) hours as needed for pain. 90 tablet 3  . Blood Glucose Monitoring Suppl (TRUE METRIX METER) w/Device KIT 1 each by Does not apply route as needed. 1 kit 0  . gabapentin (NEURONTIN) 100 MG capsule Take 2 capsules (200 mg total) by mouth 3 (three) times daily. 180 capsule 5  . glucose blood (TRUE METRIX BLOOD GLUCOSE  TEST) test strip 1 each by Other route 3 (three) times daily. 100 each 11  . Insulin Glargine (LANTUS SOLOSTAR) 100 UNIT/ML Solostar Pen Inject 15 Units into the skin daily. 15 pen 3  . Insulin Pen Needle (B-D ULTRAFINE III SHORT PEN) 31G X 8 MM MISC 1 application by Does not apply route daily. 100 each 3  . losartan (COZAAR) 50 MG tablet Take 1 tablet (50 mg total) by mouth daily. 30 tablet 5  . metFORMIN (GLUCOPHAGE XR) 500 MG 24 hr tablet Take 2 tablets (1,000 mg total) by mouth daily after supper. 69 tablet 5  . omeprazole (PRILOSEC) 20 MG capsule TAKE 1 CAPSULE BY MOUTH 2 TIMES DAILY BEFORE A MEAL. 60 capsule 0  . TRUEPLUS LANCETS 28G MISC 1 each by Does not apply route 3 (three) times daily. 100 each 11   Facility-Administered Medications Prior to Visit  Medication Dose Route Frequency Provider Last Rate Last Dose  . 0.9 %  sodium chloride infusion   Intravenous Once Hadessah Grennan, MD        ROS Review of Systems  Constitutional: Negative for chills, fatigue, fever and unexpected weight change.  HENT: Positive for hearing loss (R ear ).   Eyes: Negative for visual disturbance.  Respiratory: Negative for cough and shortness of breath.   Cardiovascular: Positive for chest pain. Negative for palpitations and leg swelling.  Gastrointestinal: Positive for  abdominal pain and nausea. Negative for blood in stool, constipation, diarrhea and vomiting.  Endocrine: Positive for polydipsia and polyuria. Negative for polyphagia.  Musculoskeletal: Positive for arthralgias (R wrist ). Negative for back pain, gait problem, myalgias and neck pain.  Skin: Negative for rash.  Allergic/Immunologic: Negative for immunocompromised state.  Neurological: Positive for dizziness, numbness (in feet ) and headaches.  Hematological: Negative for adenopathy. Does not bruise/bleed easily.  Psychiatric/Behavioral: Positive for sleep disturbance and suicidal ideas. Negative for dysphoric mood. The patient is not  nervous/anxious.     Objective:  BP (!) 146/83 (BP Location: Left Arm, Patient Position: Sitting, Cuff Size: Small)   Pulse 66   Temp 98.4 F (36.9 C) (Oral)   Ht _0  (1.626 m)   Wt 159 lb 9.6 oz (72.4 kg)   SpO2 98%   BMI 27.40 kg/m   BP/Weight 04/11/2016 30/0/9233 0/0/7622  Systolic BP 633 354 562  Diastolic BP 83 82 80  Wt. (Lbs) 159.6 159 152.8  BMI 27.4 27.29 26.23   Wt Readings from Last 3 Encounters:  04/11/16 159 lb 9.6 oz (72.4 kg)  03/14/16 159 lb (72.1 kg)  01/18/16 152 lb 12.8 oz (69.3 kg)    Physical Exam  Constitutional: He appears well-developed and well-nourished. No distress.  HENT:  Head: Normocephalic and atraumatic.  Left Ear: Tympanic membrane, external ear and ear canal normal.  Ears:  Neck: Normal range of motion. Neck supple.  Cardiovascular: Normal rate, regular rhythm, normal heart sounds and intact distal pulses.   Pulmonary/Chest: Effort normal and breath sounds normal.  Abdominal: Soft. Bowel sounds are normal. He exhibits no distension and no mass. There is no tenderness. There is no rebound and no guarding.  Genitourinary: Rectal exam shows anal tone normal and guaiac negative stool.  Musculoskeletal: He exhibits no edema.  Neurological: He is alert.  Skin: Skin is warm and dry. No rash noted. No erythema.  Psychiatric: He has a normal mood and affect.   EKG: normal EKG, normal sinus rhythm . Depression screen [facility] 2/9 04/11/2016 03/14/2016 09/10/2014  Decreased Interest 3 2 0  Down, Depressed, Hopeless 3 1 0  PHQ - 2 Score 6 3 0  Altered sleeping 2 3 -  Tired, decreased energy 3 3 -  Change in appetite 2 3 -  Feeling bad or failure about yourself  3 1 -  Trouble concentrating 0 3 -  Moving slowly or fidgety/restless 3 2 -  Suicidal thoughts 3 3 -  PHQ-9 Score 22 21 -   GAD 7 : Generalized Anxiety Score 04/11/2016 03/14/2016  Nervous, Anxious, on Edge 3 3  Control/stop worrying 3 1  Worry too much - different things 3 2    Trouble relaxing 3 3  Restless 3 3  Easily annoyed or irritable 3 1  Afraid - awful might happen 3 3  Total GAD 7 Score 21 16     Lab Results  Component Value Date   HGBA1C 8.7 01/18/2016   CBG 156 Assessment & Plan:   Parminder was seen today for diabetes.  Diagnoses and all orders for this visit:  Type 2 diabetes mellitus with diabetic polyneuropathy, with long-term current use of insulin (HCC) -     POCT glucose (manual entry) -     gabapentin (NEURONTIN) 300 MG capsule; Take 1 capsule (300 mg total) by mouth 3 (three) times daily.  Diabetic polyneuropathy associated with type 2 diabetes mellitus (HCC) -     gabapentin (NEURONTIN) 300 MG  capsule; Take 1 capsule (300 mg total) by mouth 3 (three) times daily.  Depressed mood -     traZODone (DESYREL) 50 MG tablet; Take 0.5-1 tablets (25-50 mg total) by mouth at bedtime as needed for sleep.  Right-sided chest pain -     EKG 12-Lead  Impacted cerumen of right ear -     carbamide peroxide (DEBROX) 6.5 % otic solution; Place 5 drops into the right ear 2 (two) times daily. For 5 days  Other orders -     Tdap vaccine greater than or equal to 7yo IM -     pregabalin (LYRICA) 50 MG capsule; Take 1 capsule (50 mg total) by mouth 3 (three) times daily. For PASS    No orders of the defined types were placed in this encounter.   Follow-up: No Follow-up on file.   Boykin Nearing MD

## 2016-04-13 ENCOUNTER — Encounter: Payer: Self-pay | Admitting: Family Medicine

## 2016-04-13 DIAGNOSIS — F329 Major depressive disorder, single episode, unspecified: Secondary | ICD-10-CM

## 2016-04-13 DIAGNOSIS — H6121 Impacted cerumen, right ear: Secondary | ICD-10-CM | POA: Insufficient documentation

## 2016-04-13 DIAGNOSIS — R4589 Other symptoms and signs involving emotional state: Secondary | ICD-10-CM | POA: Insufficient documentation

## 2016-04-13 MED ORDER — CARBAMIDE PEROXIDE 6.5 % OT SOLN: 5.0000 [drp] | Freq: Two times a day (BID) | OTIC | 0 refills | Status: DC

## 2016-04-13 NOTE — Assessment & Plan Note (Signed)
A: normal sugars P: Continue current regimen

## 2016-04-13 NOTE — Assessment & Plan Note (Signed)
Painful neuropathy Plan: Increase gabapentin to TID Also apply for lyrica

## 2016-04-13 NOTE — Assessment & Plan Note (Addendum)
Chest pains with cause uncertain Normal EKG CXR ordered  If CXR normal Will proceed with HIDA scan

## 2016-04-13 NOTE — Assessment & Plan Note (Signed)
A: depressed mood with suicidal thoughts and poor sleep  P: Starting trazodone Internal referral to CSW

## 2016-04-17 ENCOUNTER — Telehealth: Payer: Self-pay | Admitting: Family Medicine

## 2016-04-17 NOTE — Telephone Encounter (Signed)
Pt calling to see if disability paperwork has been completed

## 2016-04-18 NOTE — Telephone Encounter (Signed)
Will route to PCP 

## 2016-04-21 NOTE — Telephone Encounter (Signed)
Pt calling again to check on paperwork I informed pt that we allow anywhere from 7-14 business days for paperwork and he will be contacted once it is completed and ready to be picked up

## 2016-04-25 NOTE — Telephone Encounter (Signed)
I  No longer have the paperwork Please have another copy of the form [phone] (Medical Certification for Disability Exception) for [employer] mailed for faxed in from the

## 2016-04-26 ENCOUNTER — Ambulatory Visit: Payer: Self-pay | Attending: Internal Medicine

## 2016-04-26 NOTE — Telephone Encounter (Signed)
Pt son was called and informed to bring in another copy of the form that needs to be filled out. Pt's son agreed and will bring form in.

## 2016-04-28 NOTE — Telephone Encounter (Signed)
Paperwork received 04/28/2016

## 2016-04-28 NOTE — Telephone Encounter (Signed)
Pts sons dropped off paperwork Paperwork placed in PCPs box

## 2016-04-30 ENCOUNTER — Encounter: Payer: Self-pay | Admitting: Family Medicine

## 2016-04-30 NOTE — Telephone Encounter (Signed)
form [phone] (Medical Certification for Disability Exception) for [employer]  Completed Please copy and place copy in to be scanned  Please call patient to pick up original

## 2016-05-01 ENCOUNTER — Telehealth: Payer: Self-pay

## 2016-05-01 NOTE — Telephone Encounter (Signed)
Pt was called and informed that paperwork is ready to be picked up. 

## 2016-05-01 NOTE — Telephone Encounter (Signed)
Paperwork picked up by son

## 2016-05-16 ENCOUNTER — Ambulatory Visit: Payer: Medicaid Other | Attending: Family Medicine | Admitting: Family Medicine

## 2016-05-16 ENCOUNTER — Encounter: Payer: Self-pay | Admitting: Family Medicine

## 2016-05-16 VITALS — BP 144/83 | HR 56 | Temp 98.8°F | Ht 64.0 in | Wt 161.0 lb

## 2016-05-16 DIAGNOSIS — S62001A Unspecified fracture of navicular [scaphoid] bone of right wrist, initial encounter for closed fracture: Secondary | ICD-10-CM | POA: Insufficient documentation

## 2016-05-16 DIAGNOSIS — IMO0002 Reserved for concepts with insufficient information to code with codable children: Secondary | ICD-10-CM

## 2016-05-16 DIAGNOSIS — R4589 Other symptoms and signs involving emotional state: Secondary | ICD-10-CM

## 2016-05-16 DIAGNOSIS — F329 Major depressive disorder, single episode, unspecified: Secondary | ICD-10-CM

## 2016-05-16 DIAGNOSIS — Z794 Long term (current) use of insulin: Secondary | ICD-10-CM

## 2016-05-16 DIAGNOSIS — M67439 Ganglion, unspecified wrist: Secondary | ICD-10-CM | POA: Insufficient documentation

## 2016-05-16 DIAGNOSIS — I10 Essential (primary) hypertension: Secondary | ICD-10-CM | POA: Insufficient documentation

## 2016-05-16 DIAGNOSIS — Z79899 Other long term (current) drug therapy: Secondary | ICD-10-CM | POA: Insufficient documentation

## 2016-05-16 DIAGNOSIS — E1142 Type 2 diabetes mellitus with diabetic polyneuropathy: Secondary | ICD-10-CM

## 2016-05-16 DIAGNOSIS — E1165 Type 2 diabetes mellitus with hyperglycemia: Secondary | ICD-10-CM | POA: Diagnosis not present

## 2016-05-16 DIAGNOSIS — G8929 Other chronic pain: Secondary | ICD-10-CM | POA: Insufficient documentation

## 2016-05-16 DIAGNOSIS — M6740 Ganglion, unspecified site: Secondary | ICD-10-CM | POA: Diagnosis not present

## 2016-05-16 DIAGNOSIS — K219 Gastro-esophageal reflux disease without esophagitis: Secondary | ICD-10-CM

## 2016-05-16 LAB — GLUCOSE, POCT (MANUAL RESULT ENTRY): POC Glucose: 172 mg/dl — AB (ref 70–99)

## 2016-05-16 LAB — POCT GLYCOSYLATED HEMOGLOBIN (HGB A1C): HEMOGLOBIN A1C: 5.9

## 2016-05-16 MED ORDER — AMITRIPTYLINE HCL 50 MG PO TABS: ORAL_TABLET | ORAL | 3 refills | Status: DC

## 2016-05-16 MED ORDER — OMEPRAZOLE 20 MG PO CPDR: DELAYED_RELEASE_CAPSULE | ORAL | 0 refills | Status: DC

## 2016-05-16 NOTE — Patient Instructions (Addendum)
Shawn Patton was seen today for diabetes.  Diagnoses and all orders for this visit:  Uncontrolled type 2 diabetes mellitus with diabetic polyneuropathy, with long-term current use of insulin (HCC) -     POCT glucose (manual entry) -     POCT glycosylated hemoglobin (Hb A1C) -     amitriptyline (ELAVIL) 50 MG tablet; Take 1 tablet by mouth for 1 week before bedtime, then 2 tablets by mouth for one week, then take 3 tablets by mouth  Gastroesophageal reflux disease, esophagitis presence not specified -     omeprazole (PRILOSEC) 20 MG capsule; TAKE 1 CAPSULE BY MOUTH 2 TIMES DAILY BEFORE A MEAL.  Ganglion cyst  R wrist  Ice for 20 minutes twice daily Wear brace  Taper off gabapentin from three time daily to twice daily for 1 week, then once daily for 1 week the STOP.  Stop trazodone nightly.  F/u in 6 weeks for neuropathy   Dr.  Armen PickupFunches

## 2016-05-16 NOTE — Progress Notes (Signed)
Pt is here today to follow up on diabetes. 

## 2016-05-16 NOTE — Progress Notes (Signed)
Subjective:  Patient ID: Shawn Patton, male    DOB: [birth year]  Age: 60 y.o. MRN: [id number]  CC: Diabetes   HPI Cameran Pore had diabetes, HTN he presents with his son for  1. Diabetes: He is not checking sugars at home. He is taking lantus 15 U  and 1000 mg metformin. Denies subjective low CBGs.    2. HTN: taking losartan 50 mg daily. No SOB or peripheral edema.   3. Depression: reports depressed mood with suicidal thoughts. He is depressed because of chronic pain, in chest, back and legs. Numbness in feet. He is unable to work and provide for his family like he used to. He has trouble sleeping. He is taking trazodone and gabapentin without improvement in symptoms. He has not started lyrica.   4. Ganglion cyst: chronic. Tender at times. No enlargement.   Social History  Substance Use Topics  . Smoking status: Never Smoker  . Smokeless tobacco: Never Used  . Alcohol use No   Past Surgical History:  Procedure Laterality Date  . LAPAROSCOPIC APPENDECTOMY N/A 10/21/2014   Procedure: APPENDECTOMY LAPAROSCOPIC;  Surgeon: Stark Klein, MD;  Location: [facility];  Service: General;  Laterality: N/A;  . STOMACH SURGERY  2012   Outpatient Medications Prior to Visit  Medication Sig Dispense Refill  . acetaminophen (TYLENOL 8 HOUR) 650 MG CR tablet Take 1 tablet (650 mg total) by mouth every 8 (eight) hours as needed for pain. 90 tablet 3  . Blood Glucose Monitoring Suppl (TRUE METRIX METER) w/Device KIT 1 each by Does not apply route as needed. 1 kit 0  . gabapentin (NEURONTIN) 300 MG capsule Take 1 capsule (300 mg total) by mouth 3 (three) times daily. 90 capsule 2  . glucose blood (TRUE METRIX BLOOD GLUCOSE TEST) test strip 1 each by Other route 3 (three) times daily. 100 each 11  . Insulin Glargine (LANTUS SOLOSTAR) 100 UNIT/ML Solostar Pen Inject 15 Units into the skin daily. 15 pen 3  . Insulin Pen Needle (B-D ULTRAFINE III SHORT PEN) 31G X 8 MM MISC 1 application by Does not apply route daily.  100 each 3  . losartan (COZAAR) 50 MG tablet Take 1 tablet (50 mg total) by mouth daily. 30 tablet 5  . metFORMIN (GLUCOPHAGE XR) 500 MG 24 hr tablet Take 2 tablets (1,000 mg total) by mouth daily after supper. 69 tablet 5  . omeprazole (PRILOSEC) 20 MG capsule TAKE 1 CAPSULE BY MOUTH 2 TIMES DAILY BEFORE A MEAL. 60 capsule 0  . traZODone (DESYREL) 50 MG tablet Take 0.5-1 tablets (25-50 mg total) by mouth at bedtime as needed for sleep. 30 tablet 3  . TRUEPLUS LANCETS 28G MISC 1 each by Does not apply route 3 (three) times daily. 100 each 11  . carbamide peroxide (DEBROX) 6.5 % otic solution Place 5 drops into the right ear 2 (two) times daily. For 5 days (Patient not taking: Reported on 05/16/2016) 15 mL 0  . pregabalin (LYRICA) 50 MG capsule Take 1 capsule (50 mg total) by mouth 3 (three) times daily. For PASS (Patient not taking: Reported on 05/16/2016) 90 capsule 2   Facility-Administered Medications Prior to Visit  Medication Dose Route Frequency Provider Last Rate Last Dose  . 0.9 %  sodium chloride infusion   Intravenous Once Sussan Meter, MD        ROS Review of Systems  Constitutional: Negative for chills, fatigue, fever and unexpected weight change.  HENT: Positive for hearing loss (R ear ).  Eyes: Negative for visual disturbance.  Respiratory: Negative for cough and shortness of breath.   Cardiovascular: Negative for chest pain, palpitations and leg swelling.  Gastrointestinal: Negative for abdominal pain, blood in stool, constipation, diarrhea, nausea and vomiting.  Endocrine: Positive for polydipsia and polyuria. Negative for polyphagia.  Musculoskeletal: Positive for arthralgias (R wrist ). Negative for back pain, gait problem, myalgias and neck pain.  Skin: Negative for rash.  Allergic/Immunologic: Negative for immunocompromised state.  Neurological: Positive for dizziness, numbness (in feet ) and headaches.  Hematological: Negative for adenopathy. Does not bruise/bleed  easily.  Psychiatric/Behavioral: Positive for sleep disturbance and suicidal ideas. Negative for dysphoric mood. The patient is not nervous/anxious.     Objective:  BP (!) 144/83 (BP Location: Left Arm, Patient Position: Sitting, Cuff Size: Small)   Pulse (!) 56   Temp 98.8 F (37.1 C) (Oral)   Ht _0  (1.626 m)   Wt 161 lb (73 kg)   SpO2 97%   BMI 27.64 kg/m   BP/Weight 05/16/2016 04/11/2016 62/01/3150  Systolic BP 761 607 371  Diastolic BP 83 83 82  Wt. (Lbs) 161 159.6 159  BMI 27.64 27.4 27.29   Wt Readings from Last 3 Encounters:  05/16/16 161 lb (73 kg)  04/11/16 159 lb 9.6 oz (72.4 kg)  03/14/16 159 lb (72.1 kg)    Physical Exam  Constitutional: He appears well-developed and well-nourished. No distress.  HENT:  Head: Normocephalic and atraumatic.  Left Ear: Tympanic membrane, external ear and ear canal normal.  Ears:  Neck: Normal range of motion. Neck supple.  Cardiovascular: Normal rate, regular rhythm, normal heart sounds and intact distal pulses.   Pulmonary/Chest: Effort normal and breath sounds normal.  Abdominal: Soft. Bowel sounds are normal. He exhibits no distension and no mass. There is no tenderness. There is no rebound and no guarding.  Genitourinary: Rectal exam shows anal tone normal and guaiac negative stool.  Musculoskeletal: He exhibits no edema.  Neurological: He is alert.  Skin: Skin is warm and dry. No rash noted. No erythema.  Psychiatric: He has a normal mood and affect.    Depression screen [facility] 2/9 05/16/2016 04/11/2016 03/14/2016  Decreased Interest 0 3 2  Down, Depressed, Hopeless _1 PHQ - 2 Score _2 Altered sleeping _3 Tired, decreased energy _4 Change in appetite _5 Feeling bad or failure about yourself  _6 Trouble concentrating 3 0 3  Moving slowly or fidgety/restless _7 Suicidal thoughts _8 PHQ-9 Score _9 GAD 7 : Generalized Anxiety Score 05/16/2016 04/11/2016 03/14/2016  Nervous,  Anxious, on Edge _10 Control/stop worrying _11 Worry too much - different things _12 Trouble relaxing _13 Restless _14 Easily annoyed or irritable _15 Afraid - awful might happen _16 Total GAD 7 Score _17 Lab Results  Component Value Date   HGBA1C 5.9 05/16/2016   CBG 172 Assessment & Plan:   Leondre was seen today for diabetes.  Diagnoses and all orders for this visit:  Uncontrolled type 2 diabetes mellitus with diabetic polyneuropathy, with long-term current use of insulin (HCC) -     POCT glucose (manual entry) -     POCT glycosylated hemoglobin (Hb A1C) -  amitriptyline (ELAVIL) 50 MG tablet; Take 1 tablet by mouth for 1 week before bedtime, then 2 tablets by mouth for one week, then take 3 tablets by mouth  Gastroesophageal reflux disease, esophagitis presence not specified -     omeprazole (PRILOSEC) 20 MG capsule; TAKE 1 CAPSULE BY MOUTH 2 TIMES DAILY BEFORE A MEAL.  Ganglion cyst -     Wrist splint  Controlled type 2 diabetes mellitus with diabetic polyneuropathy, with long-term current use of insulin (HCC)  Depressed mood  Diabetic polyneuropathy associated with type 2 diabetes mellitus ([place])    No orders of the defined types were placed in this encounter.   Follow-up: Return in about 6 weeks (around 06/27/2016) for neuropathy .   Boykin Nearing MD

## 2016-05-17 NOTE — Assessment & Plan Note (Signed)
Improved Med: compliant Continue current regimen

## 2016-05-17 NOTE — Assessment & Plan Note (Signed)
Chronic No improvement with gabapentin Taper off gabapentin  Add elavil

## 2016-05-17 NOTE — Assessment & Plan Note (Signed)
Ganglion cyst Brace and ice recommended

## 2016-05-17 NOTE — Assessment & Plan Note (Addendum)
No improvement Severe HTN Start elavil 50 mg nightly with plan to taper up to 150 mg nightly Stop trazodone

## 2016-05-18 ENCOUNTER — Ambulatory Visit: Payer: Self-pay | Admitting: Family Medicine

## 2016-06-23 ENCOUNTER — Ambulatory Visit: Payer: Medicaid Other | Attending: Family Medicine | Admitting: Family Medicine

## 2016-06-23 ENCOUNTER — Encounter: Payer: Self-pay | Admitting: Family Medicine

## 2016-06-23 VITALS — BP 148/88 | HR 57 | Temp 98.3°F | Resp 16 | Wt 164.0 lb

## 2016-06-23 DIAGNOSIS — E1142 Type 2 diabetes mellitus with diabetic polyneuropathy: Secondary | ICD-10-CM | POA: Diagnosis not present

## 2016-06-23 DIAGNOSIS — H6091 Unspecified otitis externa, right ear: Secondary | ICD-10-CM | POA: Diagnosis not present

## 2016-06-23 DIAGNOSIS — H9191 Unspecified hearing loss, right ear: Secondary | ICD-10-CM | POA: Diagnosis not present

## 2016-06-23 DIAGNOSIS — Z79899 Other long term (current) drug therapy: Secondary | ICD-10-CM | POA: Insufficient documentation

## 2016-06-23 DIAGNOSIS — A048 Other specified bacterial intestinal infections: Secondary | ICD-10-CM | POA: Diagnosis not present

## 2016-06-23 DIAGNOSIS — Z794 Long term (current) use of insulin: Secondary | ICD-10-CM

## 2016-06-23 DIAGNOSIS — E1165 Type 2 diabetes mellitus with hyperglycemia: Secondary | ICD-10-CM

## 2016-06-23 DIAGNOSIS — I10 Essential (primary) hypertension: Secondary | ICD-10-CM | POA: Diagnosis not present

## 2016-06-23 DIAGNOSIS — Z7984 Long term (current) use of oral hypoglycemic drugs: Secondary | ICD-10-CM | POA: Insufficient documentation

## 2016-06-23 DIAGNOSIS — Z0000 Encounter for general adult medical examination without abnormal findings: Secondary | ICD-10-CM | POA: Diagnosis not present

## 2016-06-23 DIAGNOSIS — IMO0002 Reserved for concepts with insufficient information to code with codable children: Secondary | ICD-10-CM

## 2016-06-23 DIAGNOSIS — B9681 Helicobacter pylori [H. pylori] as the cause of diseases classified elsewhere: Secondary | ICD-10-CM | POA: Insufficient documentation

## 2016-06-23 LAB — GLUCOSE, POCT (MANUAL RESULT ENTRY): POC GLUCOSE: 137 mg/dL — AB (ref 70–99)

## 2016-06-23 MED ORDER — CIPROFLOXACIN-DEXAMETHASONE 0.3-0.1 % OT SUSP: 4.0000 [drp] | Freq: Two times a day (BID) | OTIC | 0 refills | Status: DC

## 2016-06-23 MED ORDER — LOSARTAN POTASSIUM 100 MG PO TABS: 100.0000 mg | ORAL_TABLET | Freq: Every day | ORAL | 5 refills | Status: DC

## 2016-06-23 MED ORDER — AMITRIPTYLINE HCL 150 MG PO TABS: 150.0000 mg | ORAL_TABLET | Freq: Every day | ORAL | 5 refills | Status: DC

## 2016-06-23 MED ORDER — DICLOFENAC SODIUM 1 % TD GEL: 4.0000 g | Freq: Four times a day (QID) | TRANSDERMAL | 3 refills | Status: DC

## 2016-06-23 MED ORDER — LIDOCAINE 2 % EX GEL: 1.0000 "application " | Freq: Four times a day (QID) | CUTANEOUS | 3 refills | Status: DC

## 2016-06-23 NOTE — Patient Instructions (Addendum)
General was seen today for diabetes and headache.  Diagnoses and all orders for this visit:  Controlled type 2 diabetes mellitus with diabetic polyneuropathy, with long-term current use of insulin (HCC) -     POCT glucose (manual entry)  Diabetic polyneuropathy associated with type 2 diabetes mellitus (HCC) -     Ambulatory referral to Neurology -     diclofenac sodium (VOLTAREN) 1 % GEL; Apply 4 g topically 4 (four) times daily. Apply to L foot and upper back -     Lidocaine 2 % GEL; Apply 1 application topically 4 (four) times daily. To back and left foot for pain  Uncontrolled type 2 diabetes mellitus with diabetic polyneuropathy, with long-term current use of insulin (HCC) -     amitriptyline (ELAVIL) 150 MG tablet; Take 1 tablet (150 mg total) by mouth at bedtime.  Type 2 diabetes mellitus with diabetic polyneuropathy, with long-term current use of insulin (HCC) -     losartan (COZAAR) 100 MG tablet; Take 1 tablet (100 mg total) by mouth daily.  Rapid urease test for Helicobacter pylori infection positive -     Cancel: H. pylori breath test  Otitis externa of right ear, unspecified chronicity, unspecified type -     ciprofloxacin-dexamethasone (CIPRODEX) otic suspension; Place 4 drops into the right ear 2 (two) times daily. For 10 days  Hearing loss of right ear, unspecified hearing loss type -     Ambulatory referral to Audiology  Healthcare maintenance -     Ambulatory referral to Gastroenterology   Add voltaren gel  Increase losartan from 50 to 100 mg daily for hypertension Continue elavil 150 mg nightly for nerve pain, depression and trouble sleeping  Use ear drops for next 10 days in R ear You will be called with appt details  F/u in 2 months for diabetes and hypertension  Dr. Armen PickupFunches

## 2016-06-23 NOTE — Progress Notes (Signed)
Pt is in the office today for follow up on diabetes PT states his pain level today in the office is a 7 Pt states his pain is coming from his head and rib cage on right

## 2016-06-23 NOTE — Progress Notes (Signed)
Subjective:  Patient ID: Shawn Patton, male    DOB: May 24, 1956  Age: 61 y.o. MRN: [id number]  CC: Diabetes and Headache   HPI Shawn Patton had insulin dependent diabetes, HTN, diabetic neuropathy and depression he presents for f/u   3. Diabetic neuropathy: he has tapered elavil to 150 mg nightly. He reports improvement of neuropathy in his R leg and foot. He still has burning sensation in his left foot plantar and mid upper back.  He reports poor sleep with tossing and turning. He has depressed mood. He reports tapering of gabapentin and starting lyrica. He has not brought his medications today.  He also endorses headache, R ear pains with decreased hearing and buzzing in his ear. Cold sensation and bloating in his abdomen.   Social History  Substance Use Topics  . Smoking status: Never Smoker  . Smokeless tobacco: Never Used  . Alcohol use No   Past Surgical History:  Procedure Laterality Date  . LAPAROSCOPIC APPENDECTOMY N/A 10/21/2014   Procedure: APPENDECTOMY LAPAROSCOPIC;  Surgeon: Stark Klein, MD;  Location: [place];  Service: General;  Laterality: N/A;  . STOMACH SURGERY  2012   Outpatient Medications Prior to Visit  Medication Sig Dispense Refill  . acetaminophen (TYLENOL 8 HOUR) 650 MG CR tablet Take 1 tablet (650 mg total) by mouth every 8 (eight) hours as needed for pain. 90 tablet 3  . amitriptyline (ELAVIL) 50 MG tablet Take 1 tablet by mouth for 1 week before bedtime, then 2 tablets by mouth for one week, then take 3 tablets by mouth 90 tablet 3  . Blood Glucose Monitoring Suppl (TRUE METRIX METER) w/Device KIT 1 each by Does not apply route as needed. 1 kit 0  . glucose blood (TRUE METRIX BLOOD GLUCOSE TEST) test strip 1 each by Other route 3 (three) times daily. 100 each 11  . Insulin Glargine (LANTUS SOLOSTAR) 100 UNIT/ML Solostar Pen Inject 15 Units into the skin daily. 15 pen 3  . Insulin Pen Needle (B-D ULTRAFINE III SHORT PEN) 31G X 8 MM MISC 1 application by Does not  apply route daily. 100 each 3  . losartan (COZAAR) 50 MG tablet Take 1 tablet (50 mg total) by mouth daily. 30 tablet 5  . metFORMIN (GLUCOPHAGE XR) 500 MG 24 hr tablet Take 2 tablets (1,000 mg total) by mouth daily after supper. 69 tablet 5  . omeprazole (PRILOSEC) 20 MG capsule TAKE 1 CAPSULE BY MOUTH 2 TIMES DAILY BEFORE A MEAL. 60 capsule 0  . TRUEPLUS LANCETS 28G MISC 1 each by Does not apply route 3 (three) times daily. 100 each 11   Facility-Administered Medications Prior to Visit  Medication Dose Route Frequency Provider Last Rate Last Dose  . 0.9 %  sodium chloride infusion   Intravenous Once Augustino Savastano, MD        ROS Review of Systems  Constitutional: Negative for chills, fatigue, fever and unexpected weight change.  HENT: Positive for hearing loss (R ear ).   Eyes: Negative for visual disturbance.  Respiratory: Negative for cough and shortness of breath.   Cardiovascular: Negative for chest pain, palpitations and leg swelling.  Gastrointestinal: Negative for abdominal pain, blood in stool, constipation, diarrhea, nausea and vomiting.  Endocrine: Positive for polydipsia and polyuria. Negative for polyphagia.  Musculoskeletal: Positive for arthralgias (R wrist ). Negative for back pain, gait problem, myalgias and neck pain.  Skin: Negative for rash.  Allergic/Immunologic: Negative for immunocompromised state.  Neurological: Positive for dizziness, numbness (in feet )  and headaches.  Hematological: Negative for adenopathy. Does not bruise/bleed easily.  Psychiatric/Behavioral: Positive for sleep disturbance and suicidal ideas. Negative for dysphoric mood. The patient is not nervous/anxious.     Objective:  BP (!) 148/88 (BP Location: Right Arm, Patient Position: Sitting, Cuff Size: Small)   Pulse (!) 57   Temp 98.3 F (36.8 C) (Oral)   Resp 16   Wt 164 lb (74.4 kg)   SpO2 97%   BMI 28.15 kg/m   BP/Weight 05/16/2016 04/11/2016 86/12/6718  Systolic BP 947 096 283    Diastolic BP 83 83 82  Wt. (Lbs) 161 159.6 159  BMI 27.64 27.4 27.29   Wt Readings from Last 3 Encounters:  05/16/16 161 lb (73 kg)  04/11/16 159 lb 9.6 oz (72.4 kg)  03/14/16 159 lb (72.1 kg)    Physical Exam  Constitutional: He appears well-developed and well-nourished. No distress.  HENT:  Head: Normocephalic and atraumatic.  Left Ear: Tympanic membrane, external ear and ear canal normal.  Ears:  Neck: Normal range of motion. Neck supple.  Cardiovascular: Normal rate, regular rhythm, normal heart sounds and intact distal pulses.   Pulmonary/Chest: Effort normal and breath sounds normal.  Abdominal: Soft. Bowel sounds are normal. He exhibits no distension and no mass. There is no tenderness. There is no rebound and no guarding.  Genitourinary: Rectal exam shows anal tone normal and guaiac negative stool.  Musculoskeletal: He exhibits no edema.  Neurological: He is alert.  Skin: Skin is warm and dry. No rash noted. No erythema.  Psychiatric: He has a normal mood and affect.    Depression screen [facility] 2/9 05/16/2016 04/11/2016 03/14/2016  Decreased Interest 0 3 2  Down, Depressed, Hopeless _0 PHQ - 2 Score _1 Altered sleeping _2 Tired, decreased energy _3 Change in appetite _4 Feeling bad or failure about yourself  _5 Trouble concentrating 3 0 3  Moving slowly or fidgety/restless _6 Suicidal thoughts _7 PHQ-9 Score _8 GAD 7 : Generalized Anxiety Score 05/16/2016 04/11/2016 03/14/2016  Nervous, Anxious, on Edge _9 Control/stop worrying _10 Worry too much - different things _11 Trouble relaxing _12 Restless _13 Easily annoyed or irritable _14 Afraid - awful might happen _15 Total GAD 7 Score _16 Lab Results  Component Value Date   HGBA1C 5.9 05/16/2016   CBG 137 Assessment & Plan:   Ryleigh was seen today for diabetes and headache.  Diagnoses and all orders for this visit:  Controlled type 2  diabetes mellitus with diabetic polyneuropathy, with long-term current use of insulin (HCC) -     POCT glucose (manual entry)  Diabetic polyneuropathy associated with type 2 diabetes mellitus ([place]) -     Ambulatory referral to Neurology -     diclofenac sodium (VOLTAREN) 1 % GEL; Apply 4 g topically 4 (four) times daily. Apply to L foot and upper back -     Lidocaine 2 % GEL; Apply 1 application topically 4 (four) times daily. To back and left foot for pain  Uncontrolled type 2 diabetes mellitus with diabetic polyneuropathy, with long-term current use of insulin (HCC) -     amitriptyline (ELAVIL) 150 MG tablet; Take 1 tablet (150 mg total) by  mouth at bedtime.  Type 2 diabetes mellitus with diabetic polyneuropathy, with long-term current use of insulin (HCC) -     losartan (COZAAR) 100 MG tablet; Take 1 tablet (100 mg total) by mouth daily.  Rapid urease test for Helicobacter pylori infection positive -     Cancel: H. pylori breath test  Otitis externa of right ear, unspecified chronicity, unspecified type -     ciprofloxacin-dexamethasone (CIPRODEX) otic suspension; Place 4 drops into the right ear 2 (two) times daily. For 10 days  Hearing loss of right ear, unspecified hearing loss type -     Ambulatory referral to Audiology  Healthcare maintenance -     Ambulatory referral to Gastroenterology    No orders of the defined types were placed in this encounter.   Follow-up: Return in about 2 months (around 08/21/2016) for diabetes and neuropathy .   Boykin Nearing MD

## 2016-06-27 NOTE — Assessment & Plan Note (Signed)
BP elevated Increase losartan to 100 mg daily

## 2016-06-27 NOTE — Assessment & Plan Note (Addendum)
Persist despite change in treatment Plan: Increase elavil to 150 mg nightly Continue lyrica Add Voltaren gel and lidocaine gel for topical use in feet Check vitamin B12 level at next visit  Neurology referral for nerve conduction testing

## 2016-06-27 NOTE — Assessment & Plan Note (Signed)
Remains controlled with normal blood sugar

## 2016-08-22 ENCOUNTER — Telehealth: Payer: Self-pay | Admitting: Family Medicine

## 2016-08-22 NOTE — Telephone Encounter (Signed)
Agree with recommendations.  

## 2016-08-22 NOTE — Telephone Encounter (Signed)
Pt called in describing symptoms of bleeding from right ear, chest pains, and burning pain in both legs. Spoke to RN Quentin AngstKay Lieb about sx and was told to advise pt to go to ED due to bleeding from ear. Advised patient as so.

## 2016-09-13 ENCOUNTER — Ambulatory Visit: Payer: Self-pay | Attending: Family Medicine | Admitting: Audiology

## 2016-09-15 ENCOUNTER — Encounter: Payer: Self-pay | Admitting: Family Medicine

## 2016-09-15 ENCOUNTER — Ambulatory Visit: Payer: Medicaid Other | Attending: Family Medicine | Admitting: Family Medicine

## 2016-09-15 VITALS — BP 152/80 | HR 59 | Temp 98.7°F | Ht 64.0 in | Wt 167.0 lb

## 2016-09-15 DIAGNOSIS — F329 Major depressive disorder, single episode, unspecified: Secondary | ICD-10-CM | POA: Insufficient documentation

## 2016-09-15 DIAGNOSIS — E1142 Type 2 diabetes mellitus with diabetic polyneuropathy: Secondary | ICD-10-CM | POA: Diagnosis not present

## 2016-09-15 DIAGNOSIS — Z79899 Other long term (current) drug therapy: Secondary | ICD-10-CM | POA: Insufficient documentation

## 2016-09-15 DIAGNOSIS — Z794 Long term (current) use of insulin: Secondary | ICD-10-CM | POA: Insufficient documentation

## 2016-09-15 DIAGNOSIS — K219 Gastro-esophageal reflux disease without esophagitis: Secondary | ICD-10-CM | POA: Diagnosis not present

## 2016-09-15 DIAGNOSIS — H6091 Unspecified otitis externa, right ear: Secondary | ICD-10-CM

## 2016-09-15 DIAGNOSIS — I10 Essential (primary) hypertension: Secondary | ICD-10-CM

## 2016-09-15 LAB — GLUCOSE, POCT (MANUAL RESULT ENTRY): POC Glucose: 189 mg/dl — AB (ref 70–99)

## 2016-09-15 LAB — POCT GLYCOSYLATED HEMOGLOBIN (HGB A1C): Hemoglobin A1C: 6.7

## 2016-09-15 MED ORDER — INSULIN GLARGINE 100 UNIT/ML SOLOSTAR PEN: 10.0000 [IU] | PEN_INJECTOR | Freq: Every day | SUBCUTANEOUS | 3 refills | Status: DC

## 2016-09-15 MED ORDER — METFORMIN HCL ER 500 MG PO TB24: 1000.0000 mg | ORAL_TABLET | Freq: Every day | ORAL | 5 refills | Status: DC

## 2016-09-15 MED ORDER — LOSARTAN POTASSIUM 100 MG PO TABS: 100.0000 mg | ORAL_TABLET | Freq: Every day | ORAL | 5 refills | Status: DC

## 2016-09-15 MED ORDER — OMEPRAZOLE 20 MG PO CPDR: DELAYED_RELEASE_CAPSULE | ORAL | 5 refills | Status: DC

## 2016-09-15 MED ORDER — AMITRIPTYLINE HCL 50 MG PO TABS: ORAL_TABLET | ORAL | 1 refills | Status: DC

## 2016-09-15 MED ORDER — CIPROFLOXACIN-DEXAMETHASONE 0.3-0.1 % OT SUSP: 4.0000 [drp] | Freq: Two times a day (BID) | OTIC | 0 refills | Status: DC

## 2016-09-15 NOTE — Progress Notes (Signed)
Subjective:  Patient ID: Shawn Patton, male    DOB: 1955-07-09  Age: 61 y.o. MRN: [id number]  CC: Diabetes   HPI Shawn Patton had insulin dependent diabetes, HTN, diabetic neuropathy and depression he presents with his son for f/u   1. Chronic pains: He has diabetes. He reports pain in his L leg from hip to foot. He still has burning sensation in his left foot plantar and mid upper back.  He reports poor sleep with tossing and turning. He has depressed mood. He also endorses headache, R ear pains with decreased hearing and buzzing in his ear. Cold sensation and bloating in his abdomen.   He reports taking no medications for the past month.   Social History  Substance Use Topics  . Smoking status: Never Smoker  . Smokeless tobacco: Never Used  . Alcohol use No   Past Surgical History:  Procedure Laterality Date  . LAPAROSCOPIC APPENDECTOMY N/A 10/21/2014   Procedure: APPENDECTOMY LAPAROSCOPIC;  Surgeon: Stark Klein, MD;  Location: [place];  Service: General;  Laterality: N/A;  . STOMACH SURGERY  2012   Outpatient Medications Prior to Visit  Medication Sig Dispense Refill  . acetaminophen (TYLENOL 8 HOUR) 650 MG CR tablet Take 1 tablet (650 mg total) by mouth every 8 (eight) hours as needed for pain. (Patient not taking: Reported on 09/15/2016) 90 tablet 3  . amitriptyline (ELAVIL) 150 MG tablet Take 1 tablet (150 mg total) by mouth at bedtime. (Patient not taking: Reported on 09/15/2016) 30 tablet 5  . Blood Glucose Monitoring Suppl (TRUE METRIX METER) w/Device KIT 1 each by Does not apply route as needed. (Patient not taking: Reported on 09/15/2016) 1 kit 0  . ciprofloxacin-dexamethasone (CIPRODEX) otic suspension Place 4 drops into the right ear 2 (two) times daily. For 10 days (Patient not taking: Reported on 09/15/2016) 7.5 mL 0  . diclofenac sodium (VOLTAREN) 1 % GEL Apply 4 g topically 4 (four) times daily. Apply to L foot and upper back (Patient not taking: Reported on 09/15/2016) 100 g 3  .  glucose blood (TRUE METRIX BLOOD GLUCOSE TEST) test strip 1 each by Other route 3 (three) times daily. (Patient not taking: Reported on 09/15/2016) 100 each 11  . Insulin Glargine (LANTUS SOLOSTAR) 100 UNIT/ML Solostar Pen Inject 15 Units into the skin daily. (Patient not taking: Reported on 09/15/2016) 15 pen 3  . Insulin Pen Needle (B-D ULTRAFINE III SHORT PEN) 31G X 8 MM MISC 1 application by Does not apply route daily. (Patient not taking: Reported on 09/15/2016) 100 each 3  . Lidocaine 2 % GEL Apply 1 application topically 4 (four) times daily. To back and left foot for pain (Patient not taking: Reported on 09/15/2016) 28.33 g 3  . losartan (COZAAR) 100 MG tablet Take 1 tablet (100 mg total) by mouth daily. (Patient not taking: Reported on 09/15/2016) 30 tablet 5  . metFORMIN (GLUCOPHAGE XR) 500 MG 24 hr tablet Take 2 tablets (1,000 mg total) by mouth daily after supper. (Patient not taking: Reported on 09/15/2016) 69 tablet 5  . omeprazole (PRILOSEC) 20 MG capsule TAKE 1 CAPSULE BY MOUTH 2 TIMES DAILY BEFORE A MEAL. (Patient not taking: Reported on 09/15/2016) 60 capsule 0  . TRUEPLUS LANCETS 28G MISC 1 each by Does not apply route 3 (three) times daily. (Patient not taking: Reported on 09/15/2016) 100 each 11   Facility-Administered Medications Prior to Visit  Medication Dose Route Frequency Provider Last Rate Last Dose  . 0.9 %  sodium  chloride infusion   Intravenous Once Ege Muckey, MD        ROS Review of Systems  Constitutional: Negative for chills, fatigue, fever and unexpected weight change.  HENT: Positive for hearing loss (R ear ).   Eyes: Negative for visual disturbance.  Respiratory: Negative for cough and shortness of breath.   Cardiovascular: Negative for chest pain, palpitations and leg swelling.  Gastrointestinal: Negative for abdominal pain, blood in stool, constipation, diarrhea, nausea and vomiting.  Endocrine: Positive for polydipsia and polyuria. Negative for polyphagia.    Musculoskeletal: Positive for arthralgias (R wrist ). Negative for back pain, gait problem, myalgias and neck pain.  Skin: Negative for rash.  Allergic/Immunologic: Negative for immunocompromised state.  Neurological: Positive for dizziness, numbness (in feet ) and headaches.  Hematological: Negative for adenopathy. Does not bruise/bleed easily.  Psychiatric/Behavioral: Positive for sleep disturbance and suicidal ideas. Negative for dysphoric mood. The patient is not nervous/anxious.     Objective:  BP (!) 152/80   Pulse (!) 59   Temp 98.7 F (37.1 C) (Oral)   Ht 5' 4"  (1.626 m)   Wt 167 lb (75.8 kg)   SpO2 98%   BMI 28.67 kg/m   BP/Weight 09/15/2016 06/23/2016 32/07/252  Systolic BP 270 623 762  Diastolic BP 80 88 83  Wt. (Lbs) 167 164 161  BMI 28.67 28.15 27.64   Wt Readings from Last 3 Encounters:  09/15/16 167 lb (75.8 kg)  06/23/16 164 lb (74.4 kg)  05/16/16 161 lb (73 kg)    Physical Exam  Constitutional: He appears well-developed and well-nourished. No distress.  HENT:  Head: Normocephalic and atraumatic.  Left Ear: Tympanic membrane, external ear and ear canal normal.  Ears:  Neck: Normal range of motion. Neck supple.  Cardiovascular: Normal rate, regular rhythm, normal heart sounds and intact distal pulses.   Pulmonary/Chest: Effort normal and breath sounds normal.  Abdominal: Soft. Bowel sounds are normal. He exhibits no distension and no mass. There is no tenderness. There is no rebound and no guarding.  Genitourinary: Rectal exam shows anal tone normal and guaiac negative stool.  Musculoskeletal: He exhibits no edema.  Neurological: He is alert.  Skin: Skin is warm and dry. No rash noted. No erythema.  Psychiatric: He has a normal mood and affect.    Depression screen [facility] 2/9 09/15/2016 06/23/2016 05/16/2016  Decreased Interest 0 0 0  Down, Depressed, Hopeless 1 2 3   PHQ - 2 Score 1 2 3   Altered sleeping 1 3 3   Tired, decreased energy 1 1 3   Change in  appetite 3 1 3   Feeling bad or failure about yourself  3 1 3   Trouble concentrating 3 2 3   Moving slowly or fidgety/restless 2 2 3   Suicidal thoughts 3 2 3   PHQ-9 Score 17 14 24    GAD 7 : Generalized Anxiety Score 09/15/2016 06/23/2016 05/16/2016 04/11/2016  Nervous, Anxious, on Edge 1 2 3 3   Control/stop worrying 1 1 3 3   Worry too much - different things 1 2 3 3   Trouble relaxing 3 1 3 3   Restless 3 1 2 3   Easily annoyed or irritable 3 2 3 3   Afraid - awful might happen 1 3 2 3   Total GAD 7 Score 13 12 19 21      Lab Results  Component Value Date   HGBA1C 6.7 09/15/2016   CBG 189  Assessment & Plan:   Klark was seen today for diabetes.  Diagnoses and all orders for this  visit:  Controlled type 2 diabetes mellitus with diabetic polyneuropathy, with long-term current use of insulin (HCC) -     POCT glucose (manual entry) -     POCT glycosylated hemoglobin (Hb A1C) -     metFORMIN (GLUCOPHAGE XR) 500 MG 24 hr tablet; Take 2 tablets (1,000 mg total) by mouth daily after supper. -     Insulin Glargine (LANTUS SOLOSTAR) 100 UNIT/ML Solostar Pen; Inject 10 Units into the skin daily. -     losartan (COZAAR) 100 MG tablet; Take 1 tablet (100 mg total) by mouth daily.  Otitis externa of right ear, unspecified chronicity, unspecified type -     ciprofloxacin-dexamethasone (CIPRODEX) otic suspension; Place 4 drops into the right ear 2 (two) times daily. For 10 days  Gastroesophageal reflux disease, esophagitis presence not specified -     omeprazole (PRILOSEC) 20 MG capsule; TAKE 1 CAPSULE BY MOUTH 2 TIMES DAILY BEFORE A MEAL.  Diabetic polyneuropathy associated with type 2 diabetes mellitus (HCC) -     amitriptyline (ELAVIL) 50 MG tablet; Take by mouth 50 mg for one week, then 100 mg for one week, then 150 mg nightly  Essential hypertension -     losartan (COZAAR) 100 MG tablet; Take 1 tablet (100 mg total) by mouth daily.   No orders of the defined types were placed in this  encounter.   Follow-up: Return in about 6 weeks (around 10/27/2016) for depressed mood and leg pains.   Boykin Nearing MD

## 2016-09-15 NOTE — Patient Instructions (Addendum)
Shawn Patton was seen today for diabetes.  Diagnoses and all orders for this visit:  Controlled type 2 diabetes mellitus with diabetic polyneuropathy, with long-term current use of insulin (HCC) -     POCT glucose (manual entry) -     POCT glycosylated hemoglobin (Hb A1C) -     metFORMIN (GLUCOPHAGE XR) 500 MG 24 hr tablet; Take 2 tablets (1,000 mg total) by mouth daily after supper. -     Insulin Glargine (LANTUS SOLOSTAR) 100 UNIT/ML Solostar Pen; Inject 10 Units into the skin daily. -     losartan (COZAAR) 100 MG tablet; Take 1 tablet (100 mg total) by mouth daily.  Otitis externa of right ear, unspecified chronicity, unspecified type -     ciprofloxacin-dexamethasone (CIPRODEX) otic suspension; Place 4 drops into the right ear 2 (two) times daily. For 10 days  Gastroesophageal reflux disease, esophagitis presence not specified -     omeprazole (PRILOSEC) 20 MG capsule; TAKE 1 CAPSULE BY MOUTH 2 TIMES DAILY BEFORE A MEAL.  Diabetic polyneuropathy associated with type 2 diabetes mellitus (HCC) -     amitriptyline (ELAVIL) 50 MG tablet; Take by mouth 50 mg for one week, then 100 mg for one week, then 150 mg nightly  Essential hypertension -     losartan (COZAAR) 100 MG tablet; Take 1 tablet (100 mg total) by mouth daily.   f/u in 6 weeks for depressed mood and L leg pains  Dr. Armen Pickup

## 2016-09-17 DIAGNOSIS — H6091 Unspecified otitis externa, right ear: Secondary | ICD-10-CM | POA: Insufficient documentation

## 2016-09-17 NOTE — Assessment & Plan Note (Signed)
A: HTN uncontrolled meds: none P: restart losartan 100 mg daily

## 2016-09-17 NOTE — Assessment & Plan Note (Signed)
A: A1c is a goal Med: non P: Restart lantus 10 U daily Metformin 1000 mg BID

## 2016-09-17 NOTE — Assessment & Plan Note (Signed)
ciprodex course following R ear irrigation today

## 2016-09-17 NOTE — Assessment & Plan Note (Signed)
Diabetic neuropathy with depression Restart elavil 50 mg nightly with taper up to 150 mg nightly

## 2016-10-25 ENCOUNTER — Encounter: Payer: Self-pay | Admitting: Family Medicine

## 2016-11-10 ENCOUNTER — Ambulatory Visit: Payer: Self-pay | Attending: Family Medicine

## 2016-11-20 ENCOUNTER — Other Ambulatory Visit: Payer: Self-pay | Admitting: Family Medicine

## 2016-11-20 DIAGNOSIS — E1142 Type 2 diabetes mellitus with diabetic polyneuropathy: Secondary | ICD-10-CM

## 2016-11-21 ENCOUNTER — Ambulatory Visit: Payer: Medicaid Other | Attending: Family Medicine | Admitting: Family Medicine

## 2016-11-21 ENCOUNTER — Encounter: Payer: Self-pay | Admitting: Family Medicine

## 2016-11-21 VITALS — BP 171/79 | HR 56 | Temp 98.0°F | Wt 162.2 lb

## 2016-11-21 DIAGNOSIS — K089 Disorder of teeth and supporting structures, unspecified: Secondary | ICD-10-CM | POA: Diagnosis not present

## 2016-11-21 DIAGNOSIS — Z794 Long term (current) use of insulin: Secondary | ICD-10-CM

## 2016-11-21 DIAGNOSIS — E1142 Type 2 diabetes mellitus with diabetic polyneuropathy: Secondary | ICD-10-CM

## 2016-11-21 DIAGNOSIS — R6889 Other general symptoms and signs: Secondary | ICD-10-CM | POA: Diagnosis not present

## 2016-11-21 DIAGNOSIS — I10 Essential (primary) hypertension: Secondary | ICD-10-CM | POA: Diagnosis not present

## 2016-11-21 DIAGNOSIS — M79605 Pain in left leg: Secondary | ICD-10-CM | POA: Diagnosis present

## 2016-11-21 DIAGNOSIS — Z79899 Other long term (current) drug therapy: Secondary | ICD-10-CM | POA: Insufficient documentation

## 2016-11-21 DIAGNOSIS — F329 Major depressive disorder, single episode, unspecified: Secondary | ICD-10-CM | POA: Diagnosis not present

## 2016-11-21 DIAGNOSIS — E1169 Type 2 diabetes mellitus with other specified complication: Secondary | ICD-10-CM

## 2016-11-21 DIAGNOSIS — E785 Hyperlipidemia, unspecified: Secondary | ICD-10-CM

## 2016-11-21 DIAGNOSIS — K0889 Other specified disorders of teeth and supporting structures: Secondary | ICD-10-CM | POA: Insufficient documentation

## 2016-11-21 LAB — POCT GLYCOSYLATED HEMOGLOBIN (HGB A1C): Hemoglobin A1C: 6.4

## 2016-11-21 LAB — GLUCOSE, POCT (MANUAL RESULT ENTRY): POC Glucose: 251 mg/dl — AB (ref 70–99)

## 2016-11-21 MED ORDER — NORTRIPTYLINE HCL 25 MG PO CAPS: 25.0000 mg | ORAL_CAPSULE | Freq: Every day | ORAL | 5 refills | Status: DC

## 2016-11-21 MED ORDER — LOSARTAN POTASSIUM 100 MG PO TABS: 100.0000 mg | ORAL_TABLET | Freq: Every day | ORAL | 5 refills | Status: DC

## 2016-11-21 MED ORDER — METFORMIN HCL ER 500 MG PO TB24: 1000.0000 mg | ORAL_TABLET | Freq: Two times a day (BID) | ORAL | 11 refills | Status: DC

## 2016-11-21 NOTE — Progress Notes (Addendum)
Subjective:  Patient ID: Shawn Patton, male    DOB: 10/11/1955  Age: 61 y.o. MRN: [id number]  CC: Leg Pain   HPI Shawn Patton had insulin dependent diabetes, HTN, diabetic neuropathy and depression he presents with a young neighbor who is a friend of his son for f/u   Nepali interpreter Andria Frames ID # [id number]  9. Chronic pains: He has diabetes. He reports pain in his L leg from hip to foot. He still has burning sensation in his left foot plantar and mid upper back.  He reports poor sleep with tossing and turning. He has depressed mood. He also endorses headache, R ear pains with decreased hearing and buzzing in his ear. Cold sensation and bloating in his abdomen.   He reports taking no medications for the past month.   2. Forgetfulness: he reports forgetfulness for the past six years after moving to the [place] . He reports forgetting that he is cooking. He get loss when outside of forgets his way home when out walking. He has lived in the [place] for 6 years.   3. CHRONIC DIABETES  Disease Monitoring  Blood Sugar Ranges: he does not remember  Polyuria: yes   Visual problems: no   Medication Compliance: taking lantus, his son administers it. He reports he does not have courage to take it. He is out of metformin. He request pills for diabetes.  Medication Side Effects  Hypoglycemia: no   Preventitive Health Care  Eye Exam: due   Foot Exam: done today    4. CHRONIC HYPERTENSION  Disease Monitoring  Blood pressure range: not checking   Chest pain: no   Dyspnea: no   Claudication: no   Medication compliance: no  Medication Side Effects  Lightheadedness: no   Urinary frequency: yes   Edema: no   5. Immigration paperwork: patient request medical exemption from civic test. Form completed and mailed back to [employer].   Social History  Substance Use Topics  . Smoking status: Never Smoker  . Smokeless tobacco: Never Used  . Alcohol use No   Past Surgical History:    Procedure Laterality Date  . LAPAROSCOPIC APPENDECTOMY N/A 10/21/2014   Procedure: APPENDECTOMY LAPAROSCOPIC;  Surgeon: Stark Klein, MD;  Location: [facility];  Service: General;  Laterality: N/A;  . STOMACH SURGERY  2012   Outpatient Medications Prior to Visit  Medication Sig Dispense Refill  . amitriptyline (ELAVIL) 50 MG tablet Take by mouth 50 mg for one week, then 100 mg for one week, then 150 mg nightly 90 tablet 1  . ciprofloxacin-dexamethasone (CIPRODEX) otic suspension Place 4 drops into the right ear 2 (two) times daily. For 10 days 7.5 mL 0  . Insulin Glargine (LANTUS SOLOSTAR) 100 UNIT/ML Solostar Pen Inject 10 Units into the skin daily. 15 pen 3  . losartan (COZAAR) 100 MG tablet Take 1 tablet (100 mg total) by mouth daily. 30 tablet 5  . metFORMIN (GLUCOPHAGE XR) 500 MG 24 hr tablet Take 2 tablets (1,000 mg total) by mouth daily after supper. 60 tablet 5  . omeprazole (PRILOSEC) 20 MG capsule TAKE 1 CAPSULE BY MOUTH 2 TIMES DAILY BEFORE A MEAL. 60 capsule 5  . Blood Glucose Monitoring Suppl (TRUE METRIX METER) w/Device KIT 1 each by Does not apply route as needed. (Patient not taking: Reported on 09/15/2016) 1 kit 0  . glucose blood (TRUE METRIX BLOOD GLUCOSE TEST) test strip 1 each by Other route 3 (three) times daily. (Patient not taking: Reported  on 09/15/2016) 100 each 11  . Insulin Pen Needle (B-D ULTRAFINE III SHORT PEN) 31G X 8 MM MISC 1 application by Does not apply route daily. (Patient not taking: Reported on 11/21/2016) 100 each 3  . TRUEPLUS LANCETS 28G MISC 1 each by Does not apply route 3 (three) times daily. (Patient not taking: Reported on 09/15/2016) 100 each 11   Facility-Administered Medications Prior to Visit  Medication Dose Route Frequency Provider Last Rate Last Dose  . 0.9 %  sodium chloride infusion   Intravenous Once Gifford Ballon, MD        ROS Review of Systems  Constitutional: Negative for chills, fatigue, fever and unexpected weight change.  HENT:  Positive for hearing loss (R ear ).   Eyes: Negative for visual disturbance.  Respiratory: Negative for cough and shortness of breath.   Cardiovascular: Negative for chest pain, palpitations and leg swelling.  Gastrointestinal: Negative for abdominal pain, blood in stool, constipation, diarrhea, nausea and vomiting.       Cold stomach  Endocrine: Positive for polydipsia and polyuria. Negative for polyphagia.  Musculoskeletal: Positive for arthralgias (R wrist ). Negative for back pain, gait problem, myalgias and neck pain.  Skin: Negative for rash.  Allergic/Immunologic: Negative for immunocompromised state.  Neurological: Positive for dizziness, numbness (in feet ) and headaches.  Hematological: Negative for adenopathy. Does not bruise/bleed easily.  Psychiatric/Behavioral: Positive for sleep disturbance and suicidal ideas. Negative for dysphoric mood. The patient is not nervous/anxious.     Objective:  BP (!) 171/79   Pulse (!) 56   Temp 98 F (36.7 C) (Oral)   Wt 162 lb 3.2 oz (73.6 kg)   SpO2 99%   BMI 27.84 kg/m   BP/Weight 11/21/2016 09/15/2016 3/70/4888  Systolic BP 916 945 038  Diastolic BP 79 80 88  Wt. (Lbs) 162.2 167 164  BMI 27.84 28.67 28.15   Wt Readings from Last 3 Encounters:  11/21/16 162 lb 3.2 oz (73.6 kg)  09/15/16 167 lb (75.8 kg)  06/23/16 164 lb (74.4 kg)    Physical Exam  Constitutional: He appears well-developed and well-nourished. No distress.  HENT:  Head: Normocephalic and atraumatic.  Left Ear: Tympanic membrane, external ear and ear canal normal.  Mouth/Throat: Abnormal dentition.  Neck: Normal range of motion. Neck supple.  Cardiovascular: Normal rate, regular rhythm, normal heart sounds and intact distal pulses.   Pulmonary/Chest: Effort normal and breath sounds normal.  Abdominal: Soft. Bowel sounds are normal. He exhibits no distension and no mass. There is no tenderness. There is no rebound and no guarding.  Genitourinary: Rectal exam  shows anal tone normal and guaiac negative stool.  Musculoskeletal: He exhibits no edema.  Neurological: He is alert.  Skin: Skin is warm and dry. No rash noted. No erythema.  Psychiatric: He has a normal mood and affect.   MMSE - Mini Mental State Exam 11/21/2016  Orientation to time 2  Orientation to Place 1  Registration 0  Attention/ Calculation 0  Recall 0  Language- name 2 objects 2  Language- repeat 0  Language- follow 3 step command 3  Language- read & follow direction 1  Write a sentence 1  Copy design 0  Total score 10    Depression screen [facility] 2/9 11/21/2016 09/15/2016 06/23/2016  Decreased Interest 2 0 0  Down, Depressed, Hopeless 2 1 2   PHQ - 2 Score 4 1 2   Altered sleeping 3 1 3   Tired, decreased energy 3 1 1   Change in appetite  1 3 1   Feeling bad or failure about yourself  2 3 1   Trouble concentrating 3 3 2   Moving slowly or fidgety/restless 2 2 2   Suicidal thoughts 1 3 2   PHQ-9 Score 19 17 14    GAD 7 : Generalized Anxiety Score 11/21/2016 09/15/2016 06/23/2016 05/16/2016  Nervous, Anxious, on Edge 2 1 2 3   Control/stop worrying 2 1 1 3   Worry too much - different things 3 1 2 3   Trouble relaxing 2 3 1 3   Restless 3 3 1 2   Easily annoyed or irritable 2 3 2 3   Afraid - awful might happen 2 1 3 2   Total GAD 7 Score 16 13 12 19      Lab Results  Component Value Date   HGBA1C 6.7 09/15/2016   CBG 251 Assessment & Plan:   Elijio was seen today for leg pain.  Diagnoses and all orders for this visit:  Controlled type 2 diabetes mellitus with diabetic polyneuropathy, with long-term current use of insulin (HCC) -     POCT glucose (manual entry) -     HgB A1c -     losartan (COZAAR) 100 MG tablet; Take 1 tablet (100 mg total) by mouth daily. -     metFORMIN (GLUCOPHAGE XR) 500 MG 24 hr tablet; Take 2 tablets (1,000 mg total) by mouth 2 (two) times daily. -     Lipid Panel  Essential hypertension -     losartan (COZAAR) 100 MG tablet; Take 1 tablet (100 mg  total) by mouth daily.  Diabetic polyneuropathy associated with type 2 diabetes mellitus (HCC) -     nortriptyline (PAMELOR) 25 MG capsule; Take 1 capsule (25 mg total) by mouth at bedtime.  Forgetfulness -     Vitamin B12 -     BMP8+EGFR -     RPR -     CT Head Wo Contrast; Future -     Ambulatory referral to Neurology  Poor dentition -     Ambulatory referral to Dentistry   No orders of the defined types were placed in this encounter.   Follow-up: Return in about 4 weeks (around 12/19/2016) for BP check .   Boykin Nearing MD

## 2016-11-21 NOTE — Assessment & Plan Note (Addendum)
Elevated BP Restart losartan 100 mg daily Add chlorthalidone 25 mg daily if BP still elevated

## 2016-11-21 NOTE — Assessment & Plan Note (Signed)
Start pamelor 25 mg nightly

## 2016-11-21 NOTE — Assessment & Plan Note (Signed)
A: controlled diabetes P: Stop lantus due to patient request, inability to self administer and inconsistent help from son Metformin 1000 mg BID

## 2016-11-21 NOTE — Patient Instructions (Addendum)
Shawn Patton was seen today for leg pain.  Diagnoses and all orders for this visit:  Controlled type 2 diabetes mellitus with diabetic polyneuropathy, with long-term current use of insulin (HCC) -     POCT glucose (manual entry) -     HgB A1c -     losartan (COZAAR) 100 MG tablet; Take 1 tablet (100 mg total) by mouth daily. -     metFORMIN (GLUCOPHAGE XR) 500 MG 24 hr tablet; Take 2 tablets (1,000 mg total) by mouth 2 (two) times daily. -     Lipid Panel  Essential hypertension -     losartan (COZAAR) 100 MG tablet; Take 1 tablet (100 mg total) by mouth daily.  Diabetic polyneuropathy associated with type 2 diabetes mellitus (HCC) -     nortriptyline (PAMELOR) 25 MG capsule; Take 1 capsule (25 mg total) by mouth at bedtime.  Forgetfulness -     Vitamin B12 -     BMP8+EGFR -     RPR -     CT Head Wo Contrast; Future -     Ambulatory referral to Neurology  Poor dentition -     Ambulatory referral to Dentistry   Stop lantus  Increase metformin to 1000 mg BID   Diabetes blood sugar goals  Fasting (in AM before breakfast, 8 hrs of no eating or drinking (except water or unsweetened coffee or tea): 90-110 2 hrs after meals: < 160,   No low sugars: nothing < 70   F/u in 4 weeks for BP and CBG check  F/u in 3 months for HTN and diabetes   Dr. Adrian Blackwater

## 2016-11-21 NOTE — Assessment & Plan Note (Signed)
Longstanding in setting of HTN uncontrolled and diabetes controlled Vascular dementia possible MMSE very low  Plan: Control BP CT head Check vit B12, RPR Neurology referral

## 2016-11-22 DIAGNOSIS — E785 Hyperlipidemia, unspecified: Secondary | ICD-10-CM

## 2016-11-22 DIAGNOSIS — E1169 Type 2 diabetes mellitus with other specified complication: Secondary | ICD-10-CM | POA: Insufficient documentation

## 2016-11-22 LAB — BMP8+EGFR
BUN/Creatinine Ratio: 12 (ref 10–24)
BUN: 11 mg/dL (ref 8–27)
CO2: 20 mmol/L (ref 20–29)
CREATININE: 0.89 mg/dL (ref 0.76–1.27)
Calcium: 10 mg/dL (ref 8.6–10.2)
Chloride: 102 mmol/L (ref 96–106)
GFR, EST AFRICAN AMERICAN: 107 mL/min/{1.73_m2} (ref >59)
GFR, EST NON AFRICAN AMERICAN: 93 mL/min/{1.73_m2} (ref >59)
GLUCOSE: 203 mg/dL — AB (ref 65–99)
Potassium: 4.2 mmol/L (ref 3.5–5.2)
SODIUM: 141 mmol/L (ref 134–144)

## 2016-11-22 LAB — LIPID PANEL
Chol/HDL Ratio: 5.2 ratio — ABNORMAL HIGH (ref 0.0–5.0)
Cholesterol, Total: 186 mg/dL (ref 100–199)
HDL: 36 mg/dL — ABNORMAL LOW (ref >39)
LDL Calculated: 106 mg/dL — ABNORMAL HIGH (ref 0–99)
Triglycerides: 221 mg/dL — ABNORMAL HIGH (ref 0–149)
VLDL Cholesterol Cal: 44 mg/dL — ABNORMAL HIGH (ref 5–40)

## 2016-11-22 LAB — VITAMIN B12: Vitamin B-12: 371 pg/mL (ref 232–1245)

## 2016-11-22 LAB — RPR: RPR Ser Ql: NONREACTIVE

## 2016-11-22 MED ORDER — ATORVASTATIN CALCIUM 20 MG PO TABS: 20.0000 mg | ORAL_TABLET | Freq: Every day | ORAL | 3 refills | Status: DC

## 2016-11-22 NOTE — Addendum Note (Signed)
Addended by: Dessa PhiFUNCHES, Chidi Shirer on: 11/22/2016 10:34 PM   Modules accepted: Orders

## 2016-11-23 ENCOUNTER — Telehealth: Payer: Self-pay | Admitting: Family Medicine

## 2016-11-23 NOTE — Telephone Encounter (Signed)
[employer] denied the  Ct head wo contrast  If you decide to appeal please call [phone]  Patient appointment is 11-24-16 @ 2pm  .  Based on [employer] Head Imaging Guidelines, we are unable to approve the requested study. Guidelines support advanced imaging after there has been a clinical diagnosis of dementia. A diagnosis can be made by a detailed history documenting memory loss and cognitive impairment affecting daily activities, and/or an abnormal mental status exam such as the Mini Mental Status Exam, Montreal Cognitive Assessment, Memory Impairment Screen, or other neuropsychological testing. The clinical information provided does not meet these criteria and, therefore, advanced imaging is not indicated at this time.

## 2016-11-24 ENCOUNTER — Ambulatory Visit (HOSPITAL_COMMUNITY): Payer: Medicaid Other

## 2016-11-30 NOTE — Telephone Encounter (Signed)
Noted denial Patient has been referred to neurology for additional evaluation of possible dementia

## 2016-12-11 ENCOUNTER — Telehealth: Payer: Self-pay

## 2016-12-11 NOTE — Telephone Encounter (Signed)
error 

## 2016-12-15 ENCOUNTER — Telehealth: Payer: Self-pay

## 2016-12-15 NOTE — Telephone Encounter (Signed)
Pt was called and informed of lab results. 

## 2016-12-19 ENCOUNTER — Ambulatory Visit: Payer: Self-pay | Admitting: Pharmacist

## 2016-12-19 NOTE — Progress Notes (Deleted)
    S:     No chief complaint on file.   Patient arrives ***.  Presents for diabetes evaluation and blood pressure evaluation. Patient was referred on 11/21/16 by Dr. Armen PickupFunches.  Patient was last seen by Primary Care Provider on 11/21/16.   Patient {Actions; denies-reports:120008} adherence with medications.  Current diabetes medications include: metformin 1000 mg BID. Patient recently stopped Lantus.  Current hypertension medications include: losartan 100 mg daily.  Patient {Actions; denies-reports:120008} hypoglycemic events.  Patient reported dietary habits: Eats *** meals/day Breakfast:*** Lunch:*** Dinner:*** Snacks:*** Drinks:***  Patient reported exercise habits:    Patient {Actions; denies-reports:120008} nocturia.  Patient {Actions; denies-reports:120008} neuropathy. Patient {Actions; denies-reports:120008} visual changes. Patient {Actions; denies-reports:120008} self foot exams.    O:  Physical Exam   ROS   Lab Results  Component Value Date   HGBA1C 6.4 11/21/2016   There were no vitals filed for this visit.  Home fasting CBG: ***  2 hour post-prandial/random CBG: ***.  10 year ASCVD risk: ***.  A/P: Diabetes longstanding currently controlled based on A1c of 6.4. Patient {Actions; denies-reports:120008} hypoglycemic events and is able to verbalize appropriate hypoglycemia management plan. Patient {Actions; denies-reports:120008} adherence with medication.   Next A1C anticipated December 2018.     Hypertension longstanding currently ***.  Patient {Actions; denies-reports:120008} adherence with medication. Control is suboptimal due to ***.   Add chlorthalidone 25 mg daily if BP still elevated  Written patient instructions provided.  Total time in face to face counseling *** minutes.   Follow up in Pharmacist Clinic Visit ***.   Patient seen with ***

## 2017-02-22 ENCOUNTER — Ambulatory Visit: Payer: Self-pay | Admitting: Internal Medicine

## 2018-02-06 ENCOUNTER — Inpatient Hospital Stay: Payer: PRIVATE HEALTH INSURANCE | Primary: Adult Health

## 2018-02-06 ENCOUNTER — Encounter

## 2018-02-06 ENCOUNTER — Inpatient Hospital Stay: Admit: 2018-02-06 | Payer: PRIVATE HEALTH INSURANCE | Primary: Adult Health

## 2018-02-06 DIAGNOSIS — R52 Pain, unspecified: Secondary | ICD-10-CM

## 2018-02-07 ENCOUNTER — Emergency Department: Payer: PRIVATE HEALTH INSURANCE | Primary: Adult Health

## 2018-02-07 ENCOUNTER — Emergency Department: Admit: 2018-02-07 | Payer: PRIVATE HEALTH INSURANCE | Primary: Adult Health

## 2018-02-07 ENCOUNTER — Inpatient Hospital Stay
Admit: 2018-02-07 | Discharge: 2018-02-09 | Disposition: A | Discharge status: Home / Self Care | Payer: PRIVATE HEALTH INSURANCE | Source: Ambulatory Visit | Admitting: Internal Medicine

## 2018-02-07 DIAGNOSIS — K219 Gastro-esophageal reflux disease without esophagitis: Secondary | ICD-10-CM

## 2018-02-07 LAB — CBC WITH AUTO DIFFERENTIAL
Basophils %: 0.3 %
Basophils Absolute: 0 10*3/uL (ref 0.0–0.2)
Eosinophils %: 1 %
Eosinophils Absolute: 0.1 10*3/uL (ref 0.0–0.6)
Hematocrit: 43.3 % (ref 40.5–52.5)
Hemoglobin: 14.6 g/dL (ref 13.5–17.5)
Lymphocytes %: 12.4 %
Lymphocytes Absolute: 1.1 10*3/uL (ref 1.0–5.1)
MCH: 30.3 pg (ref 26.0–34.0)
MCHC: 33.6 g/dL (ref 31.0–36.0)
MCV: 90.2 fL (ref 80.0–100.0)
MPV: 9.4 fL (ref 5.0–10.5)
Monocytes %: 6.5 %
Monocytes Absolute: 0.6 10*3/uL (ref 0.0–1.3)
Neutrophils %: 79.8 %
Neutrophils Absolute: 7.3 10*3/uL (ref 1.7–7.7)
Platelets: 171 10*3/uL (ref 135–450)
RBC: 4.8 M/uL (ref 4.20–5.90)
RDW: 13.7 % (ref 12.4–15.4)
WBC: 9.2 10*3/uL (ref 4.0–11.0)

## 2018-02-07 LAB — COMPREHENSIVE METABOLIC PANEL
ALT: 15 U/L (ref 10–40)
AST: 20 U/L (ref 15–37)
Albumin/Globulin Ratio: 1.3 (ref 1.1–2.2)
Albumin: 4.1 g/dL (ref 3.4–5.0)
Alkaline Phosphatase: 88 U/L (ref 40–129)
Anion Gap: 11 (ref 3–16)
BUN: 16 mg/dL (ref 7–20)
CO2: 24 mmol/L (ref 21–32)
Calcium: 9.3 mg/dL (ref 8.3–10.6)
Chloride: 106 mmol/L (ref 99–110)
Creatinine: 0.8 mg/dL (ref 0.8–1.3)
GFR African American: >60 (ref >60)
GFR Non-African American: >60 (ref >60)
Globulin: 3.2 g/dL
Glucose: 89 mg/dL (ref 70–99)
Potassium: 4 mmol/L (ref 3.5–5.1)
Sodium: 141 mmol/L (ref 136–145)
Total Bilirubin: 0.7 mg/dL (ref 0.0–1.0)
Total Protein: 7.3 g/dL (ref 6.4–8.2)

## 2018-02-07 LAB — URINALYSIS WITH REFLEX TO CULTURE
Bilirubin Urine: NEGATIVE
Blood, Urine: NEGATIVE
Glucose, Ur: NEGATIVE mg/dL
Ketones, Urine: NEGATIVE mg/dL
Leukocyte Esterase, Urine: NEGATIVE
Nitrite, Urine: NEGATIVE
Protein, UA: NEGATIVE mg/dL
Specific Gravity, UA: 1.014 (ref 1.005–1.030)
Urobilinogen, Urine: 0.2 E.U./dL (ref <2.0)
pH, UA: 6 (ref 5.0–8.0)

## 2018-02-07 LAB — TROPONIN
Troponin: <0.01 ng/mL (ref <0.01)
Troponin: <0.01 ng/mL (ref <0.01)

## 2018-02-07 LAB — BRAIN NATRIURETIC PEPTIDE: Pro-BNP: 107 pg/mL (ref 0–124)

## 2018-02-07 LAB — LIPASE: Lipase: 25 U/L (ref 13.0–60.0)

## 2018-02-07 MED ORDER — ONDANSETRON HCL 4 MG/2ML IJ SOLN
4 MG/2ML | Freq: Four times a day (QID) | INTRAMUSCULAR | Status: DC | PRN
Start: 2018-02-07 — End: 2018-02-09

## 2018-02-07 MED ORDER — NITROGLYCERIN 2 % TD OINT
2 % | Freq: Once | TRANSDERMAL | Status: AC
Start: 2018-02-07 — End: 2018-02-07
  Administered 2018-02-07: 20:00:00 1 [in_us] via TOPICAL

## 2018-02-07 MED ORDER — ONDANSETRON HCL 4 MG/2ML IJ SOLN
4 MG/2ML | INTRAMUSCULAR | Status: DC | PRN
Start: 2018-02-07 — End: 2018-02-09
  Administered 2018-02-07: 18:00:00 4 mg via INTRAVENOUS

## 2018-02-07 MED ORDER — NORMAL SALINE FLUSH 0.9 % IV SOLN
0.9 % | Freq: Two times a day (BID) | INTRAVENOUS | Status: DC
Start: 2018-02-07 — End: 2018-02-09
  Administered 2018-02-08 – 2018-02-09 (×3): 10 mL via INTRAVENOUS

## 2018-02-07 MED ORDER — ALBUTEROL SULFATE HFA 108 (90 BASE) MCG/ACT IN AERS
108 (90 Base) MCG/ACT | RESPIRATORY_TRACT | Status: DC | PRN
Start: 2018-02-07 — End: 2018-02-09

## 2018-02-07 MED ORDER — LEVOTHYROXINE SODIUM 25 MCG PO TABS
25 MCG | Freq: Every day | ORAL | Status: DC
Start: 2018-02-07 — End: 2018-02-09
  Administered 2018-02-08 – 2018-02-09 (×2): 50 ug via ORAL

## 2018-02-07 MED ORDER — MORPHINE SULFATE 4 MG/ML IJ SOLN
4 | INTRAMUSCULAR | Status: AC | PRN
Start: 2018-02-07 — End: 2018-02-08
  Administered 2018-02-07 – 2018-02-08 (×2): 4 mg via INTRAVENOUS

## 2018-02-07 MED ORDER — PERFLUTREN LIPID MICROSPHERE IV SUSP
Freq: Once | INTRAVENOUS | Status: DC | PRN
Start: 2018-02-07 — End: 2018-02-09

## 2018-02-07 MED ORDER — ACETAMINOPHEN 325 MG PO TABS
325 MG | ORAL | Status: DC | PRN
Start: 2018-02-07 — End: 2018-02-09
  Administered 2018-02-08: 12:00:00 650 mg via ORAL

## 2018-02-07 MED ORDER — ENOXAPARIN SODIUM 40 MG/0.4ML SC SOLN
40 MG/0.4ML | Freq: Every evening | SUBCUTANEOUS | Status: DC
Start: 2018-02-07 — End: 2018-02-09
  Administered 2018-02-08 – 2018-02-09 (×2): 40 mg via SUBCUTANEOUS

## 2018-02-07 MED ORDER — ASPIRIN 81 MG PO CHEW
81 MG | Freq: Once | ORAL | Status: AC
Start: 2018-02-07 — End: 2018-02-07
  Administered 2018-02-07: 18:00:00 324 mg via ORAL

## 2018-02-07 MED ORDER — PANTOPRAZOLE SODIUM 40 MG IV SOLR
40 MG | Freq: Two times a day (BID) | INTRAVENOUS | Status: DC
Start: 2018-02-07 — End: 2018-02-08
  Administered 2018-02-08 – 2018-02-09 (×2): 40 mg via INTRAVENOUS

## 2018-02-07 MED ORDER — MAGNESIUM HYDROXIDE 400 MG/5ML PO SUSP
400 MG/5ML | Freq: Every day | ORAL | Status: DC | PRN
Start: 2018-02-07 — End: 2018-02-09

## 2018-02-07 MED ORDER — NORMAL SALINE FLUSH 0.9 % IV SOLN
0.9 % | INTRAVENOUS | Status: DC | PRN
Start: 2018-02-07 — End: 2018-02-09

## 2018-02-07 MED ORDER — NITROGLYCERIN 0.4 MG SL SUBL
0.4 MG | SUBLINGUAL | Status: DC | PRN
Start: 2018-02-07 — End: 2018-02-09

## 2018-02-07 MED ORDER — PRAVASTATIN SODIUM 40 MG PO TABS
40 MG | Freq: Every day | ORAL | Status: DC
Start: 2018-02-07 — End: 2018-02-09
  Administered 2018-02-08: 22:00:00 40 mg via ORAL

## 2018-02-07 MED ORDER — LIDOCAINE VISCOUS HCL 2 % MT SOLN
2 % | Freq: Once | OROMUCOSAL | Status: AC
Start: 2018-02-07 — End: 2018-02-07
  Administered 2018-02-07: 21:00:00 via ORAL

## 2018-02-07 MED ORDER — IOPAMIDOL 76 % IV SOLN
76 % | Freq: Once | INTRAVENOUS | Status: AC | PRN
Start: 2018-02-07 — End: 2018-02-07
  Administered 2018-02-07: 18:00:00 75 mL via INTRAVENOUS

## 2018-02-07 MED ORDER — PANTOPRAZOLE SODIUM 40 MG IV SOLR
40 MG | Freq: Once | INTRAVENOUS | Status: AC
Start: 2018-02-07 — End: 2018-02-07
  Administered 2018-02-07: 21:00:00 40 mg via INTRAVENOUS

## 2018-02-07 MED FILL — PROTONIX 40 MG IV SOLR: 40 mg | INTRAVENOUS | Qty: 40

## 2018-02-07 MED FILL — MORPHINE SULFATE 4 MG/ML IJ SOLN: 4 mg/mL | INTRAMUSCULAR | Qty: 1

## 2018-02-07 MED FILL — PRAVASTATIN SODIUM 40 MG PO TABS: 40 mg | ORAL | Qty: 1

## 2018-02-07 MED FILL — ONDANSETRON HCL 4 MG/2ML IJ SOLN: 4 MG/2ML | INTRAMUSCULAR | Qty: 2

## 2018-02-07 MED FILL — NITRO-BID 2 % TD OINT: 2 % | TRANSDERMAL | Qty: 1

## 2018-02-07 MED FILL — ASPIRIN LOW STRENGTH 81 MG PO CHEW: 81 mg | ORAL | Qty: 4

## 2018-02-07 MED FILL — LOVENOX 40 MG/0.4ML SC SOLN: 40 MG/0.4ML | SUBCUTANEOUS | Qty: 0.4

## 2018-02-07 MED FILL — MAG-AL PLUS 200-200-20 MG/5ML PO LIQD: 200-200-20 MG/5ML | ORAL | Qty: 30

## 2018-02-07 MED FILL — LEVOTHYROXINE SODIUM 25 MCG PO TABS: 25 ug | ORAL | Qty: 2

## 2018-02-07 MED FILL — PROAIR HFA 108 (90 BASE) MCG/ACT IN AERS: 108 (90 Base) MCG/ACT | RESPIRATORY_TRACT | Qty: 8.5

## 2018-02-07 NOTE — Progress Notes (Signed)
Pt admitted to room 3324 from ED. VSS. Pt A&Ox4. Pt Nepali speaking-son at bedside translating for pt. POC updated with pt and son, all questions answered. Oriented pt to room and call light. Call light and bedside table within reach. Instructed to call out with any needs, v/u. Will monitor.

## 2018-02-07 NOTE — Progress Notes (Signed)
Shift assessment complete. VSS. Pt states pain is improving, pt states pain is in epigastric area. Family at bedside. Call light within reach, non skid socks on, pt independent. Aware of POC. Will continue to monitor.

## 2018-02-07 NOTE — H&P (Signed)
Hospital Medicine History & Physical      PCP: Ulice Bold, APRN - CNP    Date of Admission: 02/07/2018    Date of Service: Pt seen/examined on 02/07/2018 and Placed in Observation.    Chief Complaint: Epigastric abdominal pain      History Of Present Illness:      62 y.o. male with a history of GERD, hypothyroidism, arthritis, hyperlipidemia recently moved from [place] to here 5 months ago.  He has not been taking medications for 8 months.  He has ongoing heartburn and epigastric pain  for 1 month.   his pain has been constant with worsening on lying down and at night.  Feels short of breath and coughing at night.  Had a emergency room visit recently and was given some antibiotics and inhalers.  It did not improve any.   he recently saw his new PCP in town 1 week ago.  And was started on Zantac.   symptoms did not improve any.      He came in as his pain has been radiating up into the chest burning.  No fever.  No recent URI.  No sick contacts.  Had stress test few years ago which was negative according to the patient.,  Never had a EGD.  Takes NSAIDs every day for arthritis.         Past Medical History:          Diagnosis Date   ??? Back pain    ??? Dyslipidemia    ??? GERD (gastroesophageal reflux disease)    ??? Hypothyroidism        Past Surgical History:      History reviewed. No pertinent surgical history.    Medications Prior to Admission:      Prior to Admission medications    Medication Sig Start Date End Date Taking? Authorizing Provider   albuterol sulfate HFA 108 (90 Base) MCG/ACT inhaler Inhale 2 puffs into the lungs every 4 hours as needed  02/01/18  Yes Historical Provider, MD   naproxen (NAPROSYN) 500 MG tablet Take 500 mg by mouth 2 times daily (with meals)  02/01/18  Yes Historical Provider, MD   ranitidine (ZANTAC) 150 MG tablet Take 150 mg by mouth 2 times daily  02/01/18  Yes Historical Provider, MD   dextromethorphan (DELSYM) 30 MG/5ML extended release liquid Take 60 mg by mouth 2 times daily  as needed for Cough   Yes Historical Provider, MD   levothyroxine (SYNTHROID) 50 MCG tablet Take 50 mcg by mouth Daily   Yes Historical Provider, MD   pravastatin (PRAVACHOL) 40 MG tablet Take 40 mg by mouth daily   Yes Historical Provider, MD       Allergies:  Patient has no known allergies.    Social History:      The patient currently lives at home    TOBACCO:   reports that he has quit smoking. He has never used smokeless tobacco.  ETOH:   reports that he drank alcohol.      Family History:       Reviewed in detail and negative for DM, CAD, Cancer, CVA. Positive as follows:    History reviewed. No pertinent family history.    REVIEW OF SYSTEMS:   Pertinent positives as noted in the HPI. All other systems reviewed and negative.    PHYSICAL EXAM PERFORMED:    BP 125/80    Pulse 55    Temp 97.5 ??F (36.4 ??C) (Oral)  Resp 11    Ht 5\' 4"  (1.626 m)    Wt 140 lb (63.5 kg)    SpO2 95%    BMI 24.03 kg/m??     General appearance:  No apparent distress, appears stated age and cooperative.  HEENT:  Normal cephalic, atraumatic without obvious deformity. Pupils equal, round, and reactive to light.  Extra ocular muscles intact. Conjunctivae/corneas clear.  Neck: Supple, with full range of motion. No jugular venous distention. Trachea midline.  Respiratory:  Normal respiratory effort. Clear to auscultation, bilaterally without Rales/Wheezes/Rhonchi.  Cardiovascular:  Regular rate and rhythm with normal S1/S2 without murmurs, rubs or gallops.  Abdomen: Soft, moderate tenderness to palpation in the epigastrium, bowel sounds heard  Musculoskeletal:  No clubbing, cyanosis or edema bilaterally.  Full range of motion without deformity.  Skin: Skin color, texture, turgor normal.  No rashes or lesions.  Neurologic:  Neurovascularly intact without any focal sensory/motor deficits. Cranial nerves: II-XII intact, grossly non-focal.  Psychiatric:  Alert and oriented, thought content appropriate, normal insight  Capillary Refill: Brisk,< 3  seconds   Peripheral Pulses: +2 palpable, equal bilaterally       Labs:     Recent Labs     02/07/18  1206   WBC 9.2   HGB 14.6   HCT 43.3   PLT 171     Recent Labs     02/07/18  1206   NA 141   K 4.0   CL 106   CO2 24   BUN 16   CREATININE 0.8   CALCIUM 9.3     Recent Labs     02/07/18  1206   AST 20   ALT 15   BILITOT 0.7   ALKPHOS 88     No results for input(s): INR in the last 72 hours.  Recent Labs     02/07/18  1206   TROPONINI <0.01       Urinalysis:      Lab Results   Component Value Date    NITRU Negative 02/07/2018    BLOODU Negative 02/07/2018    SPECGRAV 1.014 02/07/2018    GLUCOSEU Negative 02/07/2018       Radiology:       EKG:  I have reviewed the EKG with the following interpretation: Sinus bradycardia at 57.  T inversions noted in V1 , V2, V3, V4 and lead III.  No old EKGs to compare with.  RepeatEKG showed similar findings.    CT CHEST PULMONARY EMBOLISM W CONTRAST   Final Result   CT PA: No evidence of pulmonary embolism.  Overall, no acute abnormality   detected.      Abdomen and pelvis CT: No acute abnormality detected.         CT ABDOMEN PELVIS W IV CONTRAST Additional Contrast? None   Final Result   CT PA: No evidence of pulmonary embolism.  Overall, no acute abnormality   detected.      Abdomen and pelvis CT: No acute abnormality detected.         XR CHEST STANDARD (2 VW)   Final Result   Limited exam due to poor inspiratory effort with no definite acute process   demonstrated             ASSESSMENT:    Active Hospital Problems    Diagnosis Date Noted   ??? Chest pain [R07.9] 02/07/2018         PLAN:    Epigastric abdominal pain : ongoing reflux symptoms.  Points towards peptic ulcer disease.  Start on PPI IV.  And GI cocktail.   Check stool H. Pylori.   May need EGD if symptoms do not improve.  Full liquids for now.  N.p.o. after midnight in case any testing needed.    -Troponin x1 neg.  Check 2 more troponins.  Telemetry.  Low suspicion for cardiac etiology.  Check echocardiogram tomorrow  morning.    GERD: PPI as above    Hypothyroidism: Continue Synthroid    Hyperlipidemia: Continue statin.    DVT Prophylaxis: lovenox ppx  Diet: full  liquids.  N.p.o. after midnight.  Code Status:  full code    PT/OT Eval Status: not needed    Dispo - medical floor/telemetry.       [name], MD    Thank you Ulice Bold, APRN - CNP for the opportunity to be involved in this patient's care. If you have any questions or concerns please feel free to contact me at [phone].

## 2018-02-07 NOTE — Progress Notes (Signed)
4 Eyes Skin Assessment     The patient is being assess for  Admission    I agree that 2 RN's have performed a thorough Head to Toe Skin Assessment on the patient. ALL assessment sites listed below have been assessed.       Areas assessed by both nurses: Elouise MunroeAmber, Margaret Cockerill  [x]    Head, Face, and Ears   [x]    Shoulders, Back, and Chest  [x]    Arms, Elbows, and Hands   [x]    Coccyx, Sacrum, and IschIum  [x]    Legs, Feet, and Heels        Does the Patient have Skin Breakdown?  No         Braden Prevention initiated:  No   Wound Care Orders initiated:  NA      WOC nurse consulted for Pressure Injury (Stage 3,4, Unstageable, DTI, NWPT, and Complex wounds), New and Established Ostomies:  NA      Nurse 1 eSignature: Electronically signed by Glendale ChardAmber Devich, RN on 02/07/18 at 6:12 PM    **SHARE this note so that the co-signing nurse is able to place an eSignature**    Nurse 2 eSignature: Electronically signed by Sherryl MangesKrista Alphonsus Doyel, RN on 02/07/18 at 6:19 PM

## 2018-02-07 NOTE — ED Provider Notes (Signed)
[facility] Emergency Department    CHIEF COMPLAINT  Chief Complaint   Patient presents with   ??? Shortness of Breath     SOB/chest pain/abdominal pain for 1 month      HISTORY OF PRESENT ILLNESS  Michael Knight is a 62 y.o. male  who presents to the ED complaining of SOB with chest pains going on for about a month or so.  It has acutely worsened since yesterday though.  No fevers but has a mild cough.  He also complains of some diffuse abdominal pains.  He denies vomiting nausea or diarrhea.  His cough is nonproductive.  He says of all his complaints, the CP and SOB are most worrisome to him and bothersome to him.  He says the chest pain is squeezing in quality and rates it a 7 out of 10 when I ask him.  He has not tried much for his pain currently.  He has a history of HLD but no prior cardiac history.  He also has a history of reflux.    No other complaints, modifying factors or associated symptoms.     I have reviewed the following from the nursing documentation.    Past Medical History:   Diagnosis Date   ??? Back pain    ??? Dyslipidemia    ??? GERD (gastroesophageal reflux disease)    ??? Hypothyroidism      History reviewed. No pertinent surgical history.  History reviewed. No pertinent family history.  Social History     Socioeconomic History   ??? Marital status: Unknown     Spouse name: Not on file   ??? Number of children: Not on file   ??? Years of education: Not on file   ??? Highest education level: Not on file   Occupational History   ??? Not on file   Social Needs   ??? Financial resource strain: Not on file   ??? Food insecurity:     Worry: Not on file     Inability: Not on file   ??? Transportation needs:     Medical: Not on file     Non-medical: Not on file   Tobacco Use   ??? Smoking status: Former Smoker   ??? Smokeless tobacco: Never Used   Substance and Sexual Activity   ??? Alcohol use: Not Currently   ??? Drug use: Never   ??? Sexual activity: Not on file   Lifestyle   ??? Physical activity:     Days per week:  Not on file     Minutes per session: Not on file   ??? Stress: Not on file   Relationships   ??? Social connections:     Talks on phone: Not on file     Gets together: Not on file     Attends religious service: Not on file     Active member of club or organization: Not on file     Attends meetings of clubs or organizations: Not on file     Relationship status: Not on file   ??? Intimate partner violence:     Fear of current or ex partner: Not on file     Emotionally abused: Not on file     Physically abused: Not on file     Forced sexual activity: Not on file   Other Topics Concern   ??? Not on file   Social History Narrative   ??? Not on file     Current Facility-Administered  Medications   Medication Dose Route Frequency Provider Last Rate Last Dose   ??? morphine injection 4 mg  4 mg Intravenous Q1H PRN Dalbert Batman, MD   4 mg at 02/07/18 1337   ??? ondansetron (ZOFRAN) injection 4 mg  4 mg Intravenous Q1H PRN Dalbert Batman, MD   4 mg at 02/07/18 1337   ??? nitroglycerin (NITRO-BID) 2 % ointment 1 inch  1 inch Topical Once Dalbert Batman, MD         Current Outpatient Medications   Medication Sig Dispense Refill   ??? naproxen (NAPROSYN) 500 MG tablet Take 500 mg by mouth 2 times daily (with meals)      ??? ranitidine (ZANTAC) 150 MG tablet Take 150 mg by mouth 2 times daily      ??? dextromethorphan (DELSYM) 30 MG/5ML extended release liquid Take 60 mg by mouth 2 times daily as needed for Cough     ??? levothyroxine (SYNTHROID) 50 MCG tablet Take 50 mcg by mouth Daily     ??? pravastatin (PRAVACHOL) 40 MG tablet Take 40 mg by mouth daily     ??? albuterol sulfate HFA 108 (90 Base) MCG/ACT inhaler        No Known Allergies    REVIEW OF SYSTEMS  10 systems reviewed, pertinent positives per HPI otherwise noted to be negative.    PHYSICAL EXAM  BP 125/80    Pulse 55    Temp 97.5 ??F (36.4 ??C) (Oral)    Resp 11    Ht 5\' 4"  (1.626 m)    Wt 140 lb (63.5 kg)    SpO2 95%    BMI 24.03 kg/m??    GENERAL APPEARANCE: Awake and alert.  Cooperative. No distress.  HENT: Normocephalic. Atraumatic. Mucous membranes are dry.  NECK: Supple.  EYES: PERRL. EOM's grossly intact.  HEART/CHEST: RRR. No murmurs.  No chest wall tenderness.  LUNGS: Respirations unlabored. CTAB. Good air exchange. Speaking comfortably in full sentences.   ABDOMEN: Mild epigastric ttp and RUQ/LUQ ttp, no lower abdominal tenderness. Soft. Non-distended. No masses. No organomegaly. No guarding or rebound. Normal bowel sounds throughout.  MUSCULOSKELETAL: No extremity edema. Compartments soft.  No deformity.  No tenderness in the extremities.  All extremities neurovascularly intact.  SKIN: Warm and dry. No acute rashes.   NEUROLOGICAL: Alert and oriented. CN's 2-12 intact. No gross facial drooping. Strength 5/5, sensation intact. 2 plus DTR's in knees bilaterally.  Gait normal.  PSYCHIATRIC: Normal mood and affect.    LABS  I have reviewed all labs for this visit.   Results for orders placed or performed during the hospital encounter of 02/07/18   CBC auto differential   Result Value Ref Range    WBC 9.2 4.0 - 11.0 K/uL    RBC 4.80 4.20 - 5.90 M/uL    Hemoglobin 14.6 13.5 - 17.5 g/dL    Hematocrit 96.0 45.4 - 52.5 %    MCV 90.2 80.0 - 100.0 fL    MCH 30.3 26.0 - 34.0 pg    MCHC 33.6 31.0 - 36.0 g/dL    RDW 09.8 11.9 - 14.7 %    Platelets 171 135 - 450 K/uL    MPV 9.4 5.0 - 10.5 fL    Neutrophils % 79.8 %    Lymphocytes % 12.4 %    Monocytes % 6.5 %    Eosinophils % 1.0 %    Basophils % 0.3 %    Neutrophils Absolute 7.3 1.7 - 7.7 K/uL  Lymphocytes Absolute 1.1 1.0 - 5.1 K/uL    Monocytes Absolute 0.6 0.0 - 1.3 K/uL    Eosinophils Absolute 0.1 0.0 - 0.6 K/uL    Basophils Absolute 0.0 0.0 - 0.2 K/uL   Comprehensive metabolic panel   Result Value Ref Range    Sodium 141 136 - 145 mmol/L    Potassium 4.0 3.5 - 5.1 mmol/L    Chloride 106 99 - 110 mmol/L    CO2 24 21 - 32 mmol/L    Anion Gap 11 3 - 16    Glucose 89 70 - 99 mg/dL    BUN 16 7 - 20 mg/dL    CREATININE 0.8 0.8 - 1.3 mg/dL     GFR Non-African American >60 >60    GFR African American >60 >60    Calcium 9.3 8.3 - 10.6 mg/dL    Total Protein 7.3 6.4 - 8.2 g/dL    Alb 4.1 3.4 - 5.0 g/dL    Albumin/Globulin Ratio 1.3 1.1 - 2.2    Total Bilirubin 0.7 0.0 - 1.0 mg/dL    Alkaline Phosphatase 88 40 - 129 U/L    ALT 15 10 - 40 U/L    AST 20 15 - 37 U/L    Globulin 3.2 g/dL   Lipase   Result Value Ref Range    Lipase 25.0 13.0 - 60.0 U/L   Urine, reflex to culture   Result Value Ref Range    Color, UA YELLOW Straw/Yellow    Clarity, UA Clear Clear    Glucose, Ur Negative Negative mg/dL    Bilirubin Urine Negative Negative    Ketones, Urine Negative Negative mg/dL    Specific Gravity, UA 1.014 1.005 - 1.030    Blood, Urine Negative Negative    pH, UA 6.0 5.0 - 8.0    Protein, UA Negative Negative mg/dL    Urobilinogen, Urine 0.2 <2.0 E.U./dL    Nitrite, Urine Negative Negative    Leukocyte Esterase, Urine Negative Negative    Microscopic Examination Not Indicated     Urine Reflex to Culture Not Indicated     Urine Type Not Specified    Troponin   Result Value Ref Range    Troponin <0.01 <0.01 ng/mL   Brain Natriuretic Peptide   Result Value Ref Range    Pro-BNP 107 0 - 124 pg/mL   EKG 12 Lead   Result Value Ref Range    Ventricular Rate 57 BPM    Atrial Rate 57 BPM    P-R Interval 158 ms    QRS Duration 86 ms    Q-T Interval 396 ms    QTc Calculation (Bazett) 385 ms    P Axis 58 degrees    R Axis 8 degrees    T Axis 5 degrees    Diagnosis       Sinus bradycardiaRSR' or QR pattern in V1 suggests right ventricular conduction delayT wave abnormality, consider anterior ischemiaAbnormal ECG     The 12 lead EKG was interpreted by me as follows:  Rate: bradycardia with a rate of 57  Rhythm: sinus  Axis: normal  Intervals: RSR' pattern, normal PR and QTc  ST segments: no ST elevations or depressions  T waves: inverted anteriorly  Non-specific T wave changes: present  Prior EKG comparison: No prior is currently available for comparison    RADIOLOGY    Xr  Chest Standard (2 Vw)    Result Date: 02/07/2018  EXAMINATION: TWO XRAY VIEWS OF THE CHEST  02/07/2018 12:37 pm COMPARISON: None. HISTORY: ORDERING SYSTEM PROVIDED HISTORY: sob,chest pain TECHNOLOGIST PROVIDED HISTORY: Reason for exam:->sob,chest pain Reason for Exam: Shortness of Breath (SOB/chest pain/abdominal pain for 1 month) Acuity: Unknown Type of Exam: Unknown FINDINGS: Poor inspiratory effort.  Cardiac size difficult to assess due to low lung volumes.  Pulmonary vascular crowding due to low lung volumes with no focal infiltrate.  Costophrenic angles sharp     Limited exam due to poor inspiratory effort with no definite acute process demonstrated     Xr Lumbar Spine (2-3 Views)    Result Date: 02/06/2018  EXAMINATION: THREE XRAY VIEWS OF THE LUMBAR SPINE 02/06/2018 10:10 am COMPARISON: None. HISTORY: ORDERING SYSTEM PROVIDED HISTORY: Pain FINDINGS: Alignment is anatomic.  Mild multilevel degenerative disc disease and facet joint arthropathy are noted.  No fractures or destructive bony abnormalities are identified.     1. Mild multilevel degenerative disc disease and facet joint arthropathy 2. No acute lumbar spine abnormality     Ct Abdomen Pelvis W Iv Contrast Additional Contrast? None    Result Date: 02/07/2018  EXAMINATION: CT OF THE ABDOMEN AND PELVIS WITH CONTRAST; CTA OF THE CHEST 02/07/2018 11:23 am; 02/07/2018 11:25 am TECHNIQUE: CT of the abdomen and pelvis was performed with the administration of intravenous contrast. Multiplanar reformatted images are provided for review. Dose modulation, iterative reconstruction, and/or weight based adjustment of the mA/kV was utilized to reduce the radiation dose to as low as reasonably achievable.; CTA of the chest was performed after the administration of intravenous contrast.  Multiplanar reformatted images are provided for review.  MIP images are provided for review. Dose modulation, iterative reconstruction, and/or weight based adjustment of the mA/kV was utilized  to reduce the radiation dose to as low as reasonably achievable. COMPARISON: None HISTORY: ORDERING SYSTEM PROVIDED HISTORY: abd pain TECHNOLOGIST PROVIDED HISTORY: If patient is on cardiac monitor and/or pulse ox, they may be taken off cardiac monitor and pulse ox, left on O2 if currently on.  All monitors reattached when patient returns to room. Additional Contrast?->None Reason for Exam: chest pain / abd pain x 1 month Acuity: Unknown Type of Exam: Unknown; ORDERING SYSTEM PROVIDED HISTORY: CP SOB TECHNOLOGIST PROVIDED HISTORY: Reason for Exam: chest pain . abd pain x 1 month Acuity: Unknown Type of Exam: Unknown FINDINGS: CT PA: Pulmonary arteries: Pulmonary arteries are adequately opacified.  No filling defects are identified to suggest pulmonary embolism.  Main pulmonary artery is normal in caliber. Mediastinum: Cardiac chambers appear mildly enlarged.  The thoracic aorta is normal in caliber without evidence of dissection.  No mediastinal lymphadenopathy is identified.  Visualized thyroid unremarkable.  Esophagus unremarkable. Lungs/pleura: Mild dependent atelectasis.  Mild atelectasis/scarring noted within the lingula as well.  Lungs otherwise clear. Soft Tissues/Bones: No osteolytic or osteoblastic bone lesions. No acute fractures. No soft tissue abnormalities are detected. Abdomen/Pelvis: Organs: Liver enhances homogeneously.  Gallbladder is unremarkable.  Portal vein is patent.  Spleen is unremarkable.  Normal pancreas.  Kidneys enhance normally. GI/Bowel: Normal large bowel.  Appendix is borderline dilated, but no periappendiceal fat stranding seen to suggest acute appendicitis. Stomach, duodenal sweep, and the remainder of the small bowel are unremarkable. Pelvis: Urinary bladder unremarkable.  Mild prostatic hypertrophy.  No free pelvic fluid. Peritoneum/Retroperitoneum: No retroperitoneal lymphadenopathy.  Imaging of the abdominal aorta is unremarkable for the patient's age. The superior mesenteric  artery is enhancing. 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  No acute bony abnormalities  detected within the abdomen and pelvis.  Extra-and extra pelvic soft tissues are unremarkable.     CT PA: No evidence of pulmonary embolism.  Overall, no acute abnormality detected. Abdomen and pelvis CT: No acute abnormality detected.     Ct Chest Pulmonary Embolism W Contrast    Result Date: 02/07/2018  EXAMINATION: CT OF THE ABDOMEN AND PELVIS WITH CONTRAST; CTA OF THE CHEST 02/07/2018 11:23 am; 02/07/2018 11:25 am TECHNIQUE: CT of the abdomen and pelvis was performed with the administration of intravenous contrast. Multiplanar reformatted images are provided for review. Dose modulation, iterative reconstruction, and/or weight based adjustment of the mA/kV was utilized to reduce the radiation dose to as low as reasonably achievable.; CTA of the chest was performed after the administration of intravenous contrast.  Multiplanar reformatted images are provided for review.  MIP images are provided for review. Dose modulation, iterative reconstruction, and/or weight based adjustment of the mA/kV was utilized to reduce the radiation dose to as low as reasonably achievable. COMPARISON: None HISTORY: ORDERING SYSTEM PROVIDED HISTORY: abd pain TECHNOLOGIST PROVIDED HISTORY: If patient is on cardiac monitor and/or pulse ox, they may be taken off cardiac monitor and pulse ox, left on O2 if currently on.  All monitors reattached when patient returns to room. Additional Contrast?->None Reason for Exam: chest pain / abd pain x 1 month Acuity: Unknown Type of Exam: Unknown; ORDERING SYSTEM PROVIDED HISTORY: CP SOB TECHNOLOGIST PROVIDED HISTORY: Reason for Exam: chest pain . abd pain x 1 month Acuity: Unknown Type of Exam: Unknown FINDINGS: CT PA: Pulmonary arteries: Pulmonary arteries are adequately opacified.  No filling defects are identified to suggest pulmonary embolism.   Main pulmonary artery is normal in caliber. Mediastinum: Cardiac chambers appear mildly enlarged.  The thoracic aorta is normal in caliber without evidence of dissection.  No mediastinal lymphadenopathy is identified.  Visualized thyroid unremarkable.  Esophagus unremarkable. Lungs/pleura: Mild dependent atelectasis.  Mild atelectasis/scarring noted within the lingula as well.  Lungs otherwise clear. Soft Tissues/Bones: No osteolytic or osteoblastic bone lesions. No acute fractures. No soft tissue abnormalities are detected. Abdomen/Pelvis: Organs: Liver enhances homogeneously.  Gallbladder is unremarkable.  Portal vein is patent.  Spleen is unremarkable.  Normal pancreas.  Kidneys enhance normally. GI/Bowel: Normal large bowel.  Appendix is borderline dilated, but no periappendiceal fat stranding seen to suggest acute appendicitis. Stomach, duodenal sweep, and the remainder of the small bowel are unremarkable. Pelvis: Urinary bladder unremarkable.  Mild prostatic hypertrophy.  No free pelvic fluid. Peritoneum/Retroperitoneum: No retroperitoneal lymphadenopathy.  Imaging of the abdominal aorta is unremarkable for the patient's age. The superior mesenteric artery is enhancing. 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  No acute bony abnormalities detected within the abdomen and pelvis.  Extra-and extra pelvic soft tissues are unremarkable.     CT PA: No evidence of pulmonary embolism.  Overall, no acute abnormality detected. Abdomen and pelvis CT: No acute abnormality detected.     ED COURSE/MDM  Patient seen and evaluated. Old records reviewed. Labs and imaging reviewed and results discussed with patient.      After initial evaluation, differential diagnostic considerations included: acute coronary syndrome, pulmonary embolism, COPD/asthma, pneumonia, musculoskeletal, reflux/PUD/gastritis, pneumothorax, CHF, thoracic aortic dissection, anxiety, kidney  stone, pyelonephritis, UTI, appendicitis, bowel obstruction, diverticulitis, hernia, gastritis/gastroenteritis, pancreatitis, cholecystitis, hepatitis, constipation, IBS, IBD    The patient's ED workup was notable for chest pain or shortness of breath.  EKG shows abnormal T wave inversions with no  prior for comparison and no recent cardiac work-ups.  He was given aspirin and nitro as well as pain medications.  His symptoms only minimally improved.  I also scanned his abdomen given his mild epigastric discomfort.  His CT chest as well as his abdomen pelvis scans are all negative for acute findings including PE or inflammatory/infectious or surgical problems.  However I am still concerned about his abnormal EKG in the setting of chest pain despite his negative troponin and therefore will admit him to the hospital for further work-up and treatment.    During the patient's ED course, the patient was given:  Medications   morphine injection 4 mg (4 mg Intravenous Given 02/07/18 1337)   ondansetron (ZOFRAN) injection 4 mg (4 mg Intravenous Given 02/07/18 1337)   nitroglycerin (NITRO-BID) 2 % ointment 1 inch (has no administration in time range)   aspirin chewable tablet 324 mg (324 mg Oral Given 02/07/18 1337)   iopamidol (ISOVUE-370) 76 % injection 100 mL (75 mLs Intravenous Given 02/07/18 1423)        CLINICAL IMPRESSION  1. Acute chest pain    2. SOB (shortness of breath)    3. Abdominal pain, epigastric    4. Abnormal EKG        Blood pressure 125/80, pulse 55, temperature 97.5 ??F (36.4 ??C), temperature source Oral, resp. rate 11, height 5\' 4"  (1.626 m), weight 140 lb (63.5 kg), SpO2 95 %.    DISPOSITION  Michael Knight was admitted in fair condition.    I have discussed the findings of today's workup with the patient and addressed the patient's questions and concerns.  The plan is to admit to the hospital at this time under the hospitalist service.  I spoke with Dr. Ermalene Postin who accepted the patient and will take over the  patient's care.  The patient is agreeable with this plan.    The total critical care time spent while evaluating and treating this patient was at least 33 minutes.  This excludes time spent doing separately billable procedures.  This includes time at the bedside, data interpretation, medication management, obtaining critical history from collateral sources if the patient is unable to provide it directly, and physician consultation.  Specifics of interventions taken and potentially life-threatening diagnostic considerations are listed above in the medical decision making.    DISCLAIMER: This chart was created using Scientist, clinical (histocompatibility and immunogenetics).  Efforts were made by me to ensure accuracy, however some errors may be present due to limitations of this technology and occasionally words are not transcribed correctly.        Dalbert Batman, MD  02/07/18 [phone]

## 2018-02-07 NOTE — ED Notes (Signed)
Pharmacy Medication History Note      List of current medications patient is taking is complete.    Source of information: [facility]    Changes made to medication list:  Medications flagged for removal (include reason, ex. noncompliance):  N/A    Medications removed (include reason, ex. therapy complete or physician discontinued):  N/A    Medications added/doses adjusted:  N/A    Other notes (ex. Recent course of antibiotics, Coumadin dosing):  Denies use of other OTC or herbal medications.    Last dose times updated.   Kathryn Brace CPHT    No current facility-administered medications on file prior to encounter.        Current Outpatient Medications on File Prior to Encounter   Medication Sig Dispense Refill    albuterol sulfate HFA 108 (90 Base) MCG/ACT inhaler Inhale 2 puffs into the lungs every 4 hours as needed       naproxen (NAPROSYN) 500 MG tablet Take 500 mg by mouth 2 times daily (with meals)       ranitidine (ZANTAC) 150 MG tablet Take 150 mg by mouth 2 times daily       dextromethorphan (DELSYM) 30 MG/5ML extended release liquid Take 60 mg by mouth 2 times daily as needed for Cough      levothyroxine (SYNTHROID) 50 MCG tablet Take 50 mcg by mouth Daily      pravastatin (PRAVACHOL) 40 MG tablet Take 40 mg by mouth daily

## 2018-02-07 NOTE — Plan of Care (Signed)
Problem: Pain:  Goal: Pain level will decrease  Description  Pain level will decrease  Outcome: Ongoing  Note:   Monitor for pain. PRN pain medications.  Goal: Control of acute pain  Description  Control of acute pain  Outcome: Ongoing

## 2018-02-07 NOTE — ED Notes (Signed)
GREETER RN NOTE: Pt taken to room, assisted into gown, placed on cardiac monitor, call light in reach, bed in lowest position, side rails up x2.     Page Spiroaryl Toye Rouillard, RN  02/07/18 1220

## 2018-02-08 ENCOUNTER — Observation Stay: Admit: 2018-02-08 | Primary: Adult Health

## 2018-02-08 LAB — EKG 12-LEAD
Atrial Rate: 55 {beats}/min
Atrial Rate: 57 {beats}/min
P Axis: 58 degrees
P Axis: 62 degrees
P-R Interval: 156 ms
P-R Interval: 158 ms
Q-T Interval: 396 ms
Q-T Interval: 406 ms
QRS Duration: 86 ms
QRS Duration: 86 ms
QTc Calculation (Bazett): 385 ms
QTc Calculation (Bazett): 388 ms
R Axis: -7 degrees
R Axis: 8 degrees
T Axis: -6 degrees
T Axis: 5 degrees
Ventricular Rate: 55 {beats}/min
Ventricular Rate: 57 {beats}/min

## 2018-02-08 LAB — CBC
Hematocrit: 40.3 % — ABNORMAL LOW (ref 40.5–52.5)
Hemoglobin: 13.7 g/dL (ref 13.5–17.5)
MCH: 30.5 pg (ref 26.0–34.0)
MCHC: 34 g/dL (ref 31.0–36.0)
MCV: 89.5 fL (ref 80.0–100.0)
MPV: 9.2 fL (ref 5.0–10.5)
Platelets: 172 10*3/uL (ref 135–450)
RBC: 4.5 M/uL (ref 4.20–5.90)
RDW: 13.9 % (ref 12.4–15.4)
WBC: 6 10*3/uL (ref 4.0–11.0)

## 2018-02-08 LAB — ECHOCARDIOGRAM COMPLETE 2D W DOPPLER W COLOR: Left Ventricular Ejection Fraction: 55

## 2018-02-08 LAB — LIPID PANEL
Cholesterol, Total: 162 mg/dL (ref 0–199)
HDL: 31 mg/dL — ABNORMAL LOW (ref 40–60)
LDL Calculated: 80 mg/dL (ref <100)
Triglycerides: 253 mg/dL — ABNORMAL HIGH (ref 0–150)
VLDL Cholesterol Calculated: 51 mg/dL

## 2018-02-08 LAB — TROPONIN: Troponin: <0.01 ng/mL (ref <0.01)

## 2018-02-08 MED ORDER — SODIUM CHLORIDE 0.9 % IV SOLN
0.9 % | INTRAVENOUS | Status: DC
Start: 2018-02-08 — End: 2018-02-08
  Administered 2018-02-08: 16:00:00 via INTRAVENOUS

## 2018-02-08 MED ORDER — EPHEDRINE SULFATE (PRESSORS) 50 MG/ML IV SOLN
50 MG/ML | INTRAVENOUS | Status: DC | PRN
Start: 2018-02-08 — End: 2018-02-08
  Administered 2018-02-08: 17:00:00 10 via INTRAVENOUS

## 2018-02-08 MED ORDER — PROPOFOL 1000 MG/100ML IV EMUL
1000 MG/100ML | INTRAVENOUS | Status: DC | PRN
Start: 2018-02-08 — End: 2018-02-08
  Administered 2018-02-08: 17:00:00 120 via INTRAVENOUS

## 2018-02-08 MED ORDER — LIDOCAINE HCL 2 % IJ SOLN
2 % | INTRAMUSCULAR | Status: DC | PRN
Start: 2018-02-08 — End: 2018-02-08
  Administered 2018-02-08: 17:00:00 60 via INTRAVENOUS

## 2018-02-08 MED ORDER — SODIUM CHLORIDE 0.9 % IV SOLN
0.9 % | INTRAVENOUS | Status: DC | PRN
Start: 2018-02-08 — End: 2018-02-08
  Administered 2018-02-08: 17:00:00 via INTRAVENOUS

## 2018-02-08 MED ORDER — PROPOFOL 200 MG/20ML IV EMUL
200 MG/20ML | INTRAVENOUS | Status: DC | PRN
Start: 2018-02-08 — End: 2018-02-08
  Administered 2018-02-08: 17:00:00 60 via INTRAVENOUS

## 2018-02-08 MED FILL — MAPAP 325 MG PO TABS: 325 mg | ORAL | Qty: 2

## 2018-02-08 MED FILL — PROTONIX 40 MG IV SOLR: 40 mg | INTRAVENOUS | Qty: 40

## 2018-02-08 MED FILL — PRAVASTATIN SODIUM 40 MG PO TABS: 40 mg | ORAL | Qty: 1

## 2018-02-08 MED FILL — MORPHINE SULFATE 4 MG/ML IJ SOLN: 4 mg/mL | INTRAMUSCULAR | Qty: 1

## 2018-02-08 MED FILL — LEVOTHYROXINE SODIUM 25 MCG PO TABS: 25 ug | ORAL | Qty: 2

## 2018-02-08 NOTE — Progress Notes (Addendum)
Teaching / education initiated regarding perioperative experience, expectations, and pain management during stay using [place] interpreter [phone].  Patient verbalized understanding.

## 2018-02-08 NOTE — Progress Notes (Signed)
Criteria met to transfer to room 324,3 A notified ,no family present,vss.

## 2018-02-08 NOTE — Progress Notes (Signed)
Dr. Daphine DeutscherMartin at bedside with interpreter phone to communicate [place]  with patient [id number]:findings

## 2018-02-08 NOTE — Anesthesia Post-Procedure Evaluation (Signed)
Department of Anesthesiology  Postprocedure Note    Patient: Michael Knight  MRN: [id number]  Birthdate: [birth year]  Date of evaluation: 02/08/2018  Time:  1:25 PM     Procedure Summary     Date:  02/08/18 Room / Location:  [facility] / [facility]    Anesthesia Start:  9458 Anesthesia Stop:  1300    Procedure:  EGD BIOPSY (N/A Abdomen) Diagnosis:  (Epigastric Pain)    Surgeon:  Norwood Levo, MD Responsible Provider:  Wray Kearns, MD    Anesthesia Type:  MAC ASA Status:  2          Anesthesia Type: MAC    Aldrete Phase I: Aldrete Score: 10    Aldrete Phase II:      Last vitals: Reviewed and per EMR flowsheets.       Anesthesia Post Evaluation    Patient location during evaluation: PACU  Patient participation: complete - patient participated  Level of consciousness: awake and awake and alert  Pain score: 2  Airway patency: patent  Nausea & Vomiting: no vomiting  Complications: no  Cardiovascular status: blood pressure returned to baseline  Respiratory status: acceptable  Hydration status: euvolemic

## 2018-02-08 NOTE — Progress Notes (Signed)
Out of pacu with  transport back to 3 a.

## 2018-02-08 NOTE — Plan of Care (Signed)
Problem: Bowel/Gastric:  Goal: Bowel function will improve  Description  Bowel function will improve  Outcome: Ongoing  Note:   Pt complains of abdominal pain in left upper quadrant. PRN pain medications not adequate to relieve pain. MD made aware. On IV Protonix 2 times daily. GI on board. Plan for EGD today. Consent for EGD signed and placed in pt chart. Will continue to monitor.

## 2018-02-08 NOTE — Progress Notes (Signed)
Received from endo - asleep,nasal cannula,left side passing flatus,abdomen soft non tender,vss.

## 2018-02-08 NOTE — Progress Notes (Signed)
Pt to endoscopy.

## 2018-02-08 NOTE — Progress Notes (Signed)
Reviewed problem list, assessment, H&P note and checklist preoperatively.Scope verified using 2 person system.  Family in waiting room.

## 2018-02-08 NOTE — Consults (Signed)
Gastroenterology Consult Note      Patient: Michael Knight  DOB: [birth year]  Acct#:      Date:  02/08/2018    Subjective:       History of Present Illness  Patient is a 62 y.o. [place]  male admitted with Chest pain [R07.9]  Chest pain [R07.9] who is seen in consult for epigastric pain. History obtained via language line. He reports 3 week h/o nonradiating epigastric pain. The pain is a squeezing sensation that occurs with eating and with movement. No associated N/V. No melena or hematochezia. Sounds like he had this pain in 2017 and was on meds (pt doesn't know names) for this but states he was off medications for 9 months. Saw a PCP last week and was started on Pepcid. Also started on naprosyn then for arthritis.       Past Medical History:   Diagnosis Date   ??? Asthma    ??? Back pain    ??? Dyslipidemia    ??? GERD (gastroesophageal reflux disease)    ??? Hypothyroidism       History reviewed. No pertinent surgical history.   Past Endoscopic History: none    Admission Meds  No current facility-administered medications on file prior to encounter.      Current Outpatient Medications on File Prior to Encounter   Medication Sig Dispense Refill   ??? albuterol sulfate HFA 108 (90 Base) MCG/ACT inhaler Inhale 2 puffs into the lungs every 4 hours as needed      ??? naproxen (NAPROSYN) 500 MG tablet Take 500 mg by mouth 2 times daily (with meals)      ??? ranitidine (ZANTAC) 150 MG tablet Take 150 mg by mouth 2 times daily      ??? levothyroxine (SYNTHROID) 50 MCG tablet Take 50 mcg by mouth Daily     ??? pravastatin (PRAVACHOL) 40 MG tablet Take 40 mg by mouth nightly               Allergies  No Known Allergies   Social   Social History     Tobacco Use   ??? Smoking status: Former Smoker   ??? Smokeless tobacco: Never Used   Substance Use Topics   ??? Alcohol use: Not Currently        History reviewed. No pertinent family history.     Review of Systems  Constitutional: negative for fevers, chills, sweats    Ears, nose, mouth, throat,  and face: negative for nasal congestion and sore throat   Respiratory: negative for cough and shortness of breath   Cardiovascular: negative for chest pain and dyspnea   Gastrointestinal: see hpi   Genitourinary:negative for dysuria and frequency   Integument/breast: negative for pruritus and rash   Hematologic/lymphatic: negative for bleeding and easy bruising   Musculoskeletal:negative for arthralgias and myalgias   Neurological: negative for dizziness and weakness       Physical Exam  Blood pressure (!) 143/77, pulse 55, temperature 97.6 ??F (36.4 ??C), temperature source Oral, resp. rate 16, height 5\' 4"  (1.626 m), weight 143 lb 3.2 oz (65 kg), SpO2 97 %.    General appearance: alert, cooperative, no distress, appears stated age  Eyes: Anicteric  Head: Normocephalic, without obvious abnormality  Lungs: clear to auscultation bilaterally, Normal Effort  Heart: regular rate and rhythm, normal S1 and S2, no murmurs or rubs  Abdomen: soft, epigastric tenderness. No guarding or rebound.  Bowel sounds normal. No masses,  no organomegaly.   Extremities: atraumatic,  no cyanosis or edema  Skin: warm and dry, no jaundice  Neuro: Grossly intact, A&OX3  Musculoskeletal: 5/5 grip strength BUE      Data Review:    Recent Labs     02/07/18  1206 02/08/18  0507   WBC 9.2 6.0   HGB 14.6 13.7   HCT 43.3 40.3*   MCV 90.2 89.5   PLT 171 172     Recent Labs     02/07/18  1206   NA 141   K 4.0   CL 106   CO2 24   BUN 16   CREATININE 0.8     Recent Labs     02/07/18  1206   AST 20   ALT 15   BILITOT 0.7   ALKPHOS 88     Recent Labs     02/07/18  1206   LIPASE 25.0     No results for input(s): PROTIME, INR in the last 72 hours.  No results for input(s): PTT in the last 72 hours.  No results for input(s): OCCULTBLD in the last 72 hours.    Imaging Studies:               CTA chest            CT-scan of abdomen and pelvis: 02/07/18  Impression   CT PA: No evidence of pulmonary embolism. ??Overall, no acute abnormality   detected.   ??   Abdomen  and pelvis CT: No acute abnormality detected.   ??                      Assessment:     Active Problems:    Chest pain  Resolved Problems:    * No resolved hospital problems. *    Epigastric pain - x3 weeks. Worse with eating and movement. CT chest, abd, pelvis unrevealing. Consider PUD, GERD.     Recommendations:   - NPO  - PPi  - avoid NSAIDs  - plan EGD today    Discussed with Dr. Romelle StarcherMartin  Alicia Stiverson, PA-C  [facility]    [facility] [facility] ST[facility]FF  I have personally performed a face to face diagnostic evaluation on this patient.  I have interviewed and examined the patient and I agree with the findings and recommended plan of care.  In summary, my findings and plan are the following: As above, 62 yo [place] man with epigastric pain near xyphoid, worse with eating or movement.  CT is unrevealing and Cardiac source unlikely per serum troponins and recent stress test.  On exam there is tenderness in the epigastrium near the left costal margin and xyphoid.  Labs are normal.  Will plan EGD to r/o PUD especially with history of NSAID use.  Empiric PPI trial ongoing.    I discussed above with him via language line and he seems to understand.    Kandice RobinsonsStephen P. Maxim Bedel, MD  [facility]  02/08/2018

## 2018-02-08 NOTE — Progress Notes (Signed)
Handoff to Samjuna R.N.,SAFETY PRECAUTIONS,CALL LIGHT IN REACH,VSS,FAMILY AT BEDSIDE.

## 2018-02-08 NOTE — Progress Notes (Signed)
Remains asleep,vss.

## 2018-02-08 NOTE — Anesthesia Pre-Procedure Evaluation (Signed)
Department of Anesthesiology  Preprocedure Note       Name:  Michael Knight   Age:  62 y.o.  DOB:  1956-01-31                                          MRN:  [id number]         Date:  02/08/2018      Surgeon: Juliann Mule):  Norwood Levo, MD    Procedure: EGD DIAGNOSTIC ONLY (N/A Abdomen)    Medications prior to admission:   Prior to Admission medications    Medication Sig Start Date End Date Taking? Authorizing Provider   albuterol sulfate HFA 108 (90 Base) MCG/ACT inhaler Inhale 2 puffs into the lungs every 4 hours as needed  02/01/18  Yes Historical Provider, MD   naproxen (NAPROSYN) 500 MG tablet Take 500 mg by mouth 2 times daily (with meals)  02/01/18  Yes Historical Provider, MD   ranitidine (ZANTAC) 150 MG tablet Take 150 mg by mouth 2 times daily  02/01/18  Yes Historical Provider, MD   levothyroxine (SYNTHROID) 50 MCG tablet Take 50 mcg by mouth Daily   Yes Historical Provider, MD   pravastatin (PRAVACHOL) 40 MG tablet Take 40 mg by mouth nightly    Yes Historical Provider, MD       Current medications:    Current Facility-Administered Medications   Medication Dose Route Frequency Provider Last Rate Last Dose   . ondansetron (ZOFRAN) injection 4 mg  4 mg Intravenous Q1H PRN Waldo Laine, MD   4 mg at 02/07/18 1337   . albuterol sulfate HFA 108 (90 Base) MCG/ACT inhaler 2 puff  2 puff Inhalation Q4H PRN Dimas Chyle, MD       . pravastatin (PRAVACHOL) tablet 40 mg  40 mg Oral Daily Dimas Chyle, MD       . levothyroxine (SYNTHROID) tablet 50 mcg  50 mcg Oral Daily Dimas Chyle, MD   50 mcg at 02/08/18 0555   . sodium chloride flush 0.9 % injection 10 mL  10 mL Intravenous 2 times per day Dimas Chyle, MD   10 mL at 02/08/18 0819   . sodium chloride flush 0.9 % injection 10 mL  10 mL Intravenous PRN Dimas Chyle, MD       . magnesium hydroxide (MILK OF MAGNESIA) 400 MG/5ML suspension 30 mL  30 mL Oral Daily PRN Dimas Chyle, MD       . ondansetron (ZOFRAN) injection 4 mg  4 mg  Intravenous Q6H PRN Dimas Chyle, MD       . enoxaparin (LOVENOX) injection 40 mg  40 mg Subcutaneous Nightly Dimas Chyle, MD   40 mg at 02/07/18 2003   . acetaminophen (TYLENOL) tablet 650 mg  650 mg Oral Q4H PRN Dimas Chyle, MD   650 mg at 02/08/18 0818   . pantoprazole (PROTONIX) injection 40 mg  40 mg Intravenous BID Dimas Chyle, MD   40 mg at 02/08/18 0818   . perflutren lipid microspheres (DEFINITY) injection 1.65 mg  1.5 mL Intravenous ONCE PRN Dimas Chyle, MD       . nitroGLYCERIN (NITROSTAT) SL tablet 0.4 mg  0.4 mg Sublingual Q5 Min PRN Dimas Chyle, MD           Allergies:  No Known Allergies    Problem List:    Patient Active Problem List  Diagnosis Code   . Chest pain R07.9       Past Medical History:        Diagnosis Date   . Asthma    . Back pain    . Dyslipidemia    . GERD (gastroesophageal reflux disease)    . Hypothyroidism        Past Surgical History:  History reviewed. No pertinent surgical history.    Social History:    Social History     Tobacco Use   . Smoking status: Former Research scientist (life sciences)   . Smokeless tobacco: Never Used   Substance Use Topics   . Alcohol use: Not Currently                                Counseling given: Not Answered      Vital Signs (Current):   Vitals:    02/08/18 0017 02/08/18 0052 02/08/18 0430 02/08/18 0800   BP:  122/69 107/72 (!) 143/77   Pulse: 57 56 52 55   Resp:   16 16   Temp:  97.8 F (36.6 C) 97.5 F (36.4 C) 97.6 F (36.4 C)   TempSrc:  Oral Temporal Oral   SpO2:  96% 96% 97%   Weight:   143 lb 3.2 oz (65 kg)    Height:                                                  BP Readings from Last 3 Encounters:   02/08/18 (!) 143/77       NPO Status:                                                                                 BMI:   Wt Readings from Last 3 Encounters:   02/08/18 143 lb 3.2 oz (65 kg)     Body mass index is 24.58 kg/m.    CBC:   Lab Results   Component Value Date    WBC 6.0 02/08/2018    RBC 4.50 02/08/2018     HGB 13.7 02/08/2018    HCT 40.3 02/08/2018    MCV 89.5 02/08/2018    RDW 13.9 02/08/2018    PLT 172 02/08/2018       CMP:   Lab Results   Component Value Date    NA 141 02/07/2018    K 4.0 02/07/2018    CL 106 02/07/2018    CO2 24 02/07/2018    BUN 16 02/07/2018    CREATININE 0.8 02/07/2018    GFRAA >60 02/07/2018    AGRATIO 1.3 02/07/2018    LABGLOM >60 02/07/2018    GLUCOSE 89 02/07/2018    PROT 7.3 02/07/2018    CALCIUM 9.3 02/07/2018    BILITOT 0.7 02/07/2018    ALKPHOS 88 02/07/2018    AST 20 02/07/2018    ALT 15 02/07/2018       POC Tests: No results for input(s): POCGLU, POCNA, POCK, POCCL, POCBUN, POCHEMO, POCHCT  in the last 72 hours.    Coags: No results found for: PROTIME, INR, APTT    HCG (If Applicable): No results found for: PREGTESTUR, PREGSERUM, HCG, HCGQUANT     ABGs: No results found for: PHART, PO2ART, PCO2ART, HCO3ART, BEART, O2SATART     Type & Screen (If Applicable):  No results found for: LABABO, [place]    Anesthesia Evaluation  Patient summary reviewed and Nursing notes reviewed  Airway: Mallampati: II  TM distance: >3 FB   Neck ROM: full  Mouth opening: > = 3 FB Dental:          Pulmonary:   (+) COPD: moderate,                             Cardiovascular:  Exercise tolerance: poor (<4 METS),                     Neuro/Psych:               GI/Hepatic/Renal:   (+) GERD: well controlled,           Endo/Other:    (+) hypothyroidism::., .                 Abdominal:           Vascular:                                        Anesthesia Plan      MAC     ASA 2       Induction: intravenous.    MIPS: Prophylactic antiemetics administered.  Anesthetic plan and risks discussed with patient.      Plan discussed with CRNA.                  Wray Kearns, MD   02/08/2018

## 2018-02-08 NOTE — Care Coordination-Inpatient (Signed)
Discharge Planning Assessment  RN discharge planner met with patient/(and family member) to discuss reason for admission, current living situation, and potential needs at the time of discharge    Demographics/Insurance verified Yes    Current type of dwelling: ranch style home with a basement    Patient from ECF/SW confirmed with: n/a    Living arrangements: with family    Level of function/Support: independent with all ADL    PCP: Colella    Last Visit to PCP: few days    YWV:PXTG    Active with any community resources/agencies/skilled home care:none    Medication compliance issues:none    Financial issues that could impact healthcare:none    Transportation at the time of discharge: family    Tentative discharge plan:home with family    Azucena Kuba, BSN, CCM, RN  [facility]  [phone]

## 2018-02-08 NOTE — Progress Notes (Signed)
Hospitalist Progress Note      PCP: Ulice Boldhristine L Colella, APRN - CNP    Date of Admission: 02/07/2018    Chief Complaint: Epigastric abdominal pain        Subjective: .  family at bedside.  Abdominal pain improving.  Had EGD this afternoon.       Medications:  Reviewed    Infusion Medications   ??? sodium chloride 100 mL/hr at 02/08/18 1153     Scheduled Medications   ??? pravastatin  40 mg Oral Daily   ??? levothyroxine  50 mcg Oral Daily   ??? sodium chloride flush  10 mL Intravenous 2 times per day   ??? enoxaparin  40 mg Subcutaneous Nightly   ??? pantoprazole  40 mg Intravenous BID     PRN Meds: ondansetron, albuterol sulfate HFA, sodium chloride flush, magnesium hydroxide, ondansetron, acetaminophen, perflutren lipid microspheres, nitroGLYCERIN      Intake/Output Summary (Last 24 hours) at 02/08/2018 1333  Last data filed at 02/08/2018 1259  Gross per 24 hour   Intake 200 ml   Output --   Net 200 ml       Physical Exam Performed:    BP 113/86    Pulse 59    Temp 97 ??F (36.1 ??C) (Temporal)    Resp 16    Ht 5\' 4"  (1.626 m)    Wt 143 lb 3.2 oz (65 kg)    SpO2 100%    BMI 24.58 kg/m??     General appearance: No apparent distress, appears stated age and cooperative.  HEENT: Pupils equal, round, and reactive to light. Conjunctivae/corneas clear.  Neck: Supple, with full range of motion. No jugular venous distention. Trachea midline.  Respiratory:  Normal respiratory effort. Clear to auscultation, bilaterally without Rales/Wheezes/Rhonchi.  Cardiovascular: Regular rate and rhythm with normal S1/S2 without murmurs, rubs or gallops.  Abdomen: Soft, moderately tender in epigastrium.  non-distended with normal bowel sounds.  Musculoskeletal: No clubbing, cyanosis or edema bilaterally.  Full range of motion without deformity.  Skin: Skin color, texture, turgor normal.  No rashes or lesions.  Neurologic:  Neurovascularly intact without any focal sensory/motor deficits. Cranial nerves: II-XII intact, grossly non-focal.  Psychiatric:  Alert and oriented, thought content appropriate, normal insight  Capillary Refill: Brisk,< 3 seconds   Peripheral Pulses: +2 palpable, equal bilaterally       Labs:   Recent Labs     02/07/18  1206 02/08/18  0507   WBC 9.2 6.0   HGB 14.6 13.7   HCT 43.3 40.3*   PLT 171 172     Recent Labs     02/07/18  1206   NA 141   K 4.0   CL 106   CO2 24   BUN 16   CREATININE 0.8   CALCIUM 9.3     Recent Labs     02/07/18  1206   AST 20   ALT 15   BILITOT 0.7   ALKPHOS 88     No results for input(s): INR in the last 72 hours.  Recent Labs     02/07/18  1206 02/07/18  1654 02/07/18  2254   TROPONINI <0.01 <0.01 <0.01       Urinalysis:      Lab Results   Component Value Date    NITRU Negative 02/07/2018    BLOODU Negative 02/07/2018    SPECGRAV 1.014 02/07/2018    GLUCOSEU Negative 02/07/2018       Radiology:  CT CHEST  PULMONARY EMBOLISM W CONTRAST   Final Result   CT PA: No evidence of pulmonary embolism.  Overall, no acute abnormality   detected.      Abdomen and pelvis CT: No acute abnormality detected.         CT ABDOMEN PELVIS W IV CONTRAST Additional Contrast? None   Final Result   CT PA: No evidence of pulmonary embolism.  Overall, no acute abnormality   detected.      Abdomen and pelvis CT: No acute abnormality detected.         XR CHEST STANDARD (2 VW)   Final Result   Limited exam due to poor inspiratory effort with no definite acute process   demonstrated                 Assessment/Plan:    Active Hospital Problems    Diagnosis   ??? Chest pain [R07.9]     Epigastric abdominal pain : ongoing reflux symptoms.  GI consulted.  EGD showed multiple small antral ulcers.  Continue PPI IV.  Await biopsy results.  Advance diet advance as tolerated.   ??  -Troponins neg. await echocardiogram results.  ??  GERD: PPI as above  ??  Hypothyroidism: Continue Synthroid  ??  Hyperlipidemia: Continue statin.  ??  DVT Prophylaxis: lovenox ppx  Diet: full  liquids.    Code Status:  full code  ??  PT/OT Eval Status: not needed  ??  Dispo - 1 more  day    [name], MD

## 2018-02-08 NOTE — Progress Notes (Signed)
Ice chips given ,vss.

## 2018-02-08 NOTE — Op Note (Addendum)
[facility]  Endoscopy Note    Patient: Michael Knight  DOB: 08/22/1955  CSN: [id number]    Procedure: Esophagogastroduodenoscopy with biopsy    Date:  02/08/2018     Surgeon:  Norwood Levo, MD    Referring Physician:   Junius Argyle    Preoperative Diagnosis:   Epigastric abdominal pain    Postoperative Diagnosis:   Gastric antral ulcers    Anesthesia:  TIVA/MAC per anesthesia     EBL: <50 mL    Indications: This is a 62 y.o. year old male who presents today with New-onset dyspepsia.      Description of Procedure:  Informed consent was obtained from the patient after explanation of indications, benefits and possible risks and complications of the procedure.  The patient was then taken to the endoscopy suite, placed in the left lateral decubitus position and the above IV sedation was administrered.    The Olympus videoendoscope was placed in the patient's mouth and under direct visualization passed into the esophagus.  Visualization of the esophagus demonstrated normal mucosa.     The scope was then advanced into the stomach.    Visualization of the gastric body and antrum demonstrated multiple (8 - 10) small (2 - 27m) clean-based ulcers in the prepyloric antrum that were cold biopsied.  A retroflexed exam of the gastric cardia and fundus demonstrated normal mucosa.  The pylorus was patent and the scope was advanced into the duodenum.  Visualization of the duodenal bulb demonstrated normal mucosa.  The second portion of the duodenum demonstrated normal mucosa.    The scope was then withdrawn back into the stomach, it was decompressed, and the scope was completely withdrawn.    The patient tolerated the procedure well and was taken to the post anesthesia care unit in good condition.    Impression:    Multiple small gastric antral ulcers - likely cause of pain.  Biopsies obtained      Recommendations: 1) Change Protonix to 40 mg BID for ulcer healing and NSAID prophylaxis.     2) Check pathology  results.     3) Regular diet as tolerated.     4) OK for discharge from GI standpoint.  I am happy to see for f/u as outpatient if pain fails to resolve.    Will follow from a distance.  Please call with questions.    SNorwood Levo MFitzgerald

## 2018-02-08 NOTE — Progress Notes (Signed)
Pt back to room from endoscopy.

## 2018-02-08 NOTE — Progress Notes (Signed)
Pt to echo.

## 2018-02-08 NOTE — Progress Notes (Signed)
Pt back to room from echo.

## 2018-02-09 MED ORDER — PANTOPRAZOLE SODIUM 40 MG PO TBEC
40 MG | Freq: Two times a day (BID) | ORAL | Status: DC
Start: 2018-02-09 — End: 2018-02-09
  Administered 2018-02-09: 10:00:00 40 mg via ORAL

## 2018-02-09 MED ORDER — PANTOPRAZOLE SODIUM 40 MG PO TBEC
40 MG | ORAL_TABLET | Freq: Two times a day (BID) | ORAL | 2 refills | Status: DC
Start: 2018-02-09 — End: 2018-02-09

## 2018-02-09 MED ORDER — PANTOPRAZOLE SODIUM 40 MG PO TBEC
40 MG | ORAL_TABLET | Freq: Two times a day (BID) | ORAL | 2 refills | Status: DC
Start: 2018-02-09 — End: 2020-12-30

## 2018-02-09 MED FILL — PANTOPRAZOLE SODIUM 40 MG PO TBEC: 40 mg | ORAL | Qty: 1

## 2018-02-09 MED FILL — LEVOTHYROXINE SODIUM 25 MCG PO TABS: 25 ug | ORAL | Qty: 2

## 2018-02-09 MED FILL — LOVENOX 40 MG/0.4ML SC SOLN: 40 MG/0.4ML | SUBCUTANEOUS | Qty: 0.4

## 2018-02-09 MED FILL — PROTONIX 40 MG IV SOLR: 40 mg | INTRAVENOUS | Qty: 40

## 2018-02-09 NOTE — Discharge Instructions (Signed)
F/u with PCP in 1-2 weeks

## 2018-02-09 NOTE — Progress Notes (Signed)
Data- discharge order received, pt verbalized agreement to discharge, disposition to previous residence, no needs for HHC/DME.     Action- discharge instructions prepared and given to pt, pt verbalized understanding. Medication information packet given r/t NEW and/or CHANGED prescriptions emphasizing name/purpose/side effects, pt verbalized understanding. Discharge instruction summary: Diet- cardiac, Activity- as tol, Primary Care Physician as follows: Ulice Boldhristine L Colella, APRN - CNP [phone] f/u appointment , immunizations reviewed, prescription medications filled paper prescription given.    Response- Pt belongings gathered, IV removed. Disposition is home (no HHC/DME needs), transported with family, ambulated to lobby, no complications.

## 2018-02-09 NOTE — Discharge Summary (Signed)
Hospital Medicine Discharge Summary    Patient ID: Michael Knight      Patient's PCP: Michael Bold, APRN - CNP    Admit Date: 02/07/2018     Discharge Date:   02/09/18     Admitting Physician: Michael Collard, MD     Discharge Physician: Michael Collard, MD     Discharge Diagnoses:       Active Hospital Problems    Diagnosis   ??? Chest pain [R07.9]       The patient was seen and examined on day of discharge and this discharge summary is in conjunction with any daily progress note from day of discharge.    History Of Present Illness:    ??  62 y.o. male with a history of GERD, hypothyroidism, arthritis, hyperlipidemia recently moved from [place] to here 5 months ago.  He has not been taking medications for 8 months.  He has ongoing heartburn and epigastric pain  for 1 month.   his pain has been constant with worsening on lying down and at night.  Feels short of breath and coughing at night.  Had a emergency room visit recently and was given some antibiotics and inhalers.  It did not improve any.   he recently saw his new PCP in town 1 week ago.  And was started on Zantac.   symptoms did not improve any.    ??  He came in as his pain has been radiating up into the chest burning.  No fever.  No recent URI.  No sick contacts.  Had stress test few years ago which was negative according to the patient.,  Never had a EGD.  Takes NSAIDs every day for arthritis.     Hospital Course:     Epigastric abdominal pain??:??ongoing reflux symptoms.  GI consulted.  EGD showed multiple small antral ulcers.  treated with  PPI . diet was gradually advanced.  Clinically improved.  Discharged home in stable condition.    ??  Sinus bradycardia: t inversions in EKG noted - no old EKG's to compare it to. Troponins neg. echo- non acute.   ??  GERD: PPI    ??  Hypothyroidism:  Synthroid  ??  Hyperlipidemia:  statin.    Physical Exam Performed:     BP 138/89    Pulse 55    Temp 97.5 ??F (36.4 ??C) (Oral)    Resp 16    Ht 5\' 4"  (1.626 m)    Wt 143  lb 3.2 oz (65 kg)    SpO2 98%    BMI 24.58 kg/m??       General appearance:  No apparent distress, appears stated age and cooperative.  HEENT:  Normal cephalic, atraumatic without obvious deformity. Pupils equal, round, and reactive to light.  Extra ocular muscles intact. Conjunctivae/corneas clear.  Neck: Supple, with full range of motion. No jugular venous distention. Trachea midline.  Respiratory:  Normal respiratory effort. Clear to auscultation, bilaterally without Rales/Wheezes/Rhonchi.  Cardiovascular:  Regular rate and rhythm with normal S1/S2 without murmurs, rubs or gallops.  Abdomen: Soft, non-tender, non-distended with normal bowel sounds.  Musculoskeletal:  No clubbing, cyanosis or edema bilaterally.  Full range of motion without deformity.  Skin: Skin color, texture, turgor normal.  No rashes or lesions.  Neurologic:  Neurovascularly intact without any focal sensory/motor deficits. Cranial nerves: II-XII intact, grossly non-focal.  Psychiatric:  Alert and oriented, thought content appropriate, normal insight  Capillary Refill: Brisk,< 3 seconds   Peripheral Pulses: +2  palpable, equal bilaterally       Labs: For convenience and continuity at follow-up the following most recent labs are provided:      CBC:    Lab Results   Component Value Date    WBC 6.0 02/08/2018    HGB 13.7 02/08/2018    HCT 40.3 02/08/2018    PLT 172 02/08/2018       Renal:    Lab Results   Component Value Date    NA 141 02/07/2018    K 4.0 02/07/2018    CL 106 02/07/2018    CO2 24 02/07/2018    BUN 16 02/07/2018    CREATININE 0.8 02/07/2018    CALCIUM 9.3 02/07/2018         Significant Diagnostic Studies    Radiology:   CT CHEST PULMONARY EMBOLISM W CONTRAST   Final Result   CT PA: No evidence of pulmonary embolism.  Overall, no acute abnormality   detected.      Abdomen and pelvis CT: No acute abnormality detected.         CT ABDOMEN PELVIS W IV CONTRAST Additional Contrast? None   Final Result   CT PA: No evidence of pulmonary  embolism.  Overall, no acute abnormality   detected.      Abdomen and pelvis CT: No acute abnormality detected.         XR CHEST STANDARD (2 VW)   Final Result   Limited exam due to poor inspiratory effort with no definite acute process   demonstrated                Consults:     IP CONSULT TO HOSPITALIST  IP CONSULT TO GI    Disposition:  Home      Condition at Discharge: Stable    Discharge Instructions/Follow-up:  F/u with PCP in 1-2 weeks    Code Status:  Full Code     Activity: activity as tolerated    Diet: regular diet      Discharge Medications:     Current Discharge Medication List           Details   pantoprazole (PROTONIX) 40 MG tablet Take 1 tablet by mouth 2 times daily (before meals)  Qty: 60 tablet, Refills: 2              Details   albuterol sulfate HFA 108 (90 Base) MCG/ACT inhaler Inhale 2 puffs into the lungs every 4 hours as needed       levothyroxine (SYNTHROID) 50 MCG tablet Take 50 mcg by mouth Daily      pravastatin (PRAVACHOL) 40 MG tablet Take 40 mg by mouth nightly              Time Spent on discharge is more than 30 minutes in the examination, evaluation, counseling and review of medications and discharge plan.      Signed:    Arvin CollardUmasankar [name], MD   02/09/2018      Thank you Michael Boldhristine L Colella, APRN - CNP for the opportunity to be involved in this patient's care. If you have any questions or concerns please feel free to contact me at [phone].

## 2018-03-11 ENCOUNTER — Other Ambulatory Visit (HOSPITAL_COMMUNITY): Payer: Self-pay | Admitting: Gastroenterology

## 2018-03-11 DIAGNOSIS — R1011 Right upper quadrant pain: Secondary | ICD-10-CM

## 2018-03-18 ENCOUNTER — Encounter (HOSPITAL_COMMUNITY)
Admission: RE | Admit: 2018-03-18 | Discharge: 2018-03-18 | Disposition: A | Discharge status: Home / Self Care | Payer: Medicaid Other | Source: Ambulatory Visit | Attending: Gastroenterology | Admitting: Gastroenterology

## 2018-03-18 DIAGNOSIS — R1011 Right upper quadrant pain: Secondary | ICD-10-CM | POA: Insufficient documentation

## 2018-03-18 MED ORDER — TECHNETIUM TC 99M MEBROFENIN IV KIT
5.0000 | PACK | Freq: Once | INTRAVENOUS | Status: AC | PRN
  Administered 2018-03-18: 5 via INTRAVENOUS

## 2018-04-05 ENCOUNTER — Emergency Department: Admit: 2018-04-05 | Payer: PRIVATE HEALTH INSURANCE | Primary: Adult Health

## 2018-04-05 ENCOUNTER — Inpatient Hospital Stay
Admit: 2018-04-05 | Discharge: 2018-04-05 | Disposition: A | Discharge status: Home / Self Care | Payer: PRIVATE HEALTH INSURANCE | Attending: Emergency Medicine

## 2018-04-05 ENCOUNTER — Emergency Department: Payer: PRIVATE HEALTH INSURANCE | Primary: Adult Health

## 2018-04-05 DIAGNOSIS — R51 Headache: Secondary | ICD-10-CM

## 2018-04-05 LAB — URINALYSIS WITH REFLEX TO CULTURE
Bilirubin Urine: NEGATIVE
Blood, Urine: NEGATIVE
Glucose, Ur: NEGATIVE mg/dL
Ketones, Urine: NEGATIVE mg/dL
Leukocyte Esterase, Urine: NEGATIVE
Nitrite, Urine: NEGATIVE
Protein, UA: NEGATIVE mg/dL
Specific Gravity, UA: 1.025 (ref 1.005–1.030)
Urobilinogen, Urine: 0.2 E.U./dL (ref <2.0)
pH, UA: 7 (ref 5.0–8.0)

## 2018-04-05 LAB — CBC WITH AUTO DIFFERENTIAL
Basophils %: 0.7 %
Basophils Absolute: 0.1 10*3/uL (ref 0.0–0.2)
Eosinophils %: 0.9 %
Eosinophils Absolute: 0.1 10*3/uL (ref 0.0–0.6)
Hematocrit: 41.7 % (ref 40.5–52.5)
Hemoglobin: 13.7 g/dL (ref 13.5–17.5)
Lymphocytes %: 15 %
Lymphocytes Absolute: 1.1 10*3/uL (ref 1.0–5.1)
MCH: 29.6 pg (ref 26.0–34.0)
MCHC: 33 g/dL (ref 31.0–36.0)
MCV: 89.9 fL (ref 80.0–100.0)
MPV: 8.1 fL (ref 5.0–10.5)
Monocytes %: 6.9 %
Monocytes Absolute: 0.5 10*3/uL (ref 0.0–1.3)
Neutrophils %: 76.5 %
Neutrophils Absolute: 5.6 10*3/uL (ref 1.7–7.7)
Platelets: 183 10*3/uL (ref 135–450)
RBC: 4.64 M/uL (ref 4.20–5.90)
RDW: 14.1 % (ref 12.4–15.4)
WBC: 7.3 10*3/uL (ref 4.0–11.0)

## 2018-04-05 LAB — COMPREHENSIVE METABOLIC PANEL W/ REFLEX TO MG FOR LOW K
ALT: 17 U/L (ref 10–40)
AST: 23 U/L (ref 15–37)
Albumin/Globulin Ratio: 1.4 (ref 1.1–2.2)
Albumin: 4 g/dL (ref 3.4–5.0)
Alkaline Phosphatase: 83 U/L (ref 40–129)
Anion Gap: 13 (ref 3–16)
BUN: 10 mg/dL (ref 7–20)
CO2: 22 mmol/L (ref 21–32)
Calcium: 9.1 mg/dL (ref 8.3–10.6)
Chloride: 104 mmol/L (ref 99–110)
Creatinine: 1 mg/dL (ref 0.8–1.3)
GFR African American: >60 (ref >60)
GFR Non-African American: >60 (ref >60)
Globulin: 2.9 g/dL
Glucose: 167 mg/dL — ABNORMAL HIGH (ref 70–99)
Potassium reflex Magnesium: 3.7 mmol/L (ref 3.5–5.1)
Sodium: 139 mmol/L (ref 136–145)
Total Bilirubin: 0.6 mg/dL (ref 0.0–1.0)
Total Protein: 6.9 g/dL (ref 6.4–8.2)

## 2018-04-05 LAB — TROPONIN: Troponin: <0.01 ng/mL (ref <0.01)

## 2018-04-05 LAB — BRAIN NATRIURETIC PEPTIDE: Pro-BNP: 105 pg/mL (ref 0–124)

## 2018-04-05 LAB — APTT: aPTT: 37 s — ABNORMAL HIGH (ref 26.0–36.0)

## 2018-04-05 LAB — PROTIME-INR
INR: 1.02 (ref 0.86–1.14)
Protime: 11.6 s (ref 9.8–13.0)

## 2018-04-05 LAB — SEDIMENTATION RATE: Sed Rate, Automated: 13 mm/h (ref 0–20)

## 2018-04-05 MED ORDER — DIPHENHYDRAMINE HCL 50 MG/ML IJ SOLN
50 MG/ML | Freq: Once | INTRAMUSCULAR | Status: AC
Start: 2018-04-05 — End: 2018-04-05
  Administered 2018-04-05: 18:00:00 25 mg via INTRAVENOUS

## 2018-04-05 MED ORDER — SODIUM CHLORIDE 0.9 % IV BOLUS
0.9 % | Freq: Once | INTRAVENOUS | Status: AC
Start: 2018-04-05 — End: 2018-04-05
  Administered 2018-04-05: 18:00:00 1000 mL via INTRAVENOUS

## 2018-04-05 MED ORDER — METOCLOPRAMIDE HCL 5 MG/ML IJ SOLN
5 MG/ML | Freq: Once | INTRAMUSCULAR | Status: AC
Start: 2018-04-05 — End: 2018-04-05
  Administered 2018-04-05: 18:00:00 10 mg via INTRAVENOUS

## 2018-04-05 MED ORDER — IOPAMIDOL 76 % IV SOLN
76 % | Freq: Once | INTRAVENOUS | Status: AC | PRN
Start: 2018-04-05 — End: 2018-04-05
  Administered 2018-04-05: 19:00:00 75 mL via INTRAVENOUS

## 2018-04-05 MED ORDER — BUTALBITAL-APAP-CAFFEINE 50-300-40 MG PO CAPS
50-300-40 MG | ORAL_CAPSULE | Freq: Four times a day (QID) | ORAL | 0 refills | Status: DC | PRN
Start: 2018-04-05 — End: 2020-08-30

## 2018-04-05 MED ORDER — ONDANSETRON 4 MG PO TBDP
4 MG | ORAL_TABLET | Freq: Three times a day (TID) | ORAL | 0 refills | Status: DC | PRN
Start: 2018-04-05 — End: 2018-06-23

## 2018-04-05 MED FILL — METOCLOPRAMIDE HCL 5 MG/ML IJ SOLN: 5 mg/mL | INTRAMUSCULAR | Qty: 2

## 2018-04-05 MED FILL — DIPHENHYDRAMINE HCL 50 MG/ML IJ SOLN: 50 mg/mL | INTRAMUSCULAR | Qty: 1

## 2018-04-05 NOTE — ED Provider Notes (Signed)
[place]        Pt Name: Michael Knight  MRN: [id number]  [place] 09/05/1955  Date of evaluation: 04/05/2018  Provider: Jeral Fruit, PA  PCP: Raechel Chute, APRN - CNP    This patient was seen and evaluated by the attending physician [facility]       Chief Complaint   Patient presents with   ??? Headache     headache, dizziness, shaking hand that started 3 days ago.        HISTORY OF PRESENT ILLNESS   (Location/Symptom, Timing/Onset, Context/Setting, Quality, Duration, Modifying Factors, Severity)  Note limiting factors.     Michael Knight is a 62 y.o. male with past medical history of asthma, back pain, hyperlipidemia, GERD and hypothyroidism who presents to the ED but of headache.  English is not patient's first language but does understand simple phrases and did offer translation but patient requested family at bedside translate for him.  States his had a headache for the past 3 days.  States headache located to the bilateral temples and radiates into the top of his head.  Patient states it is described as a "whooshing" pain.  Denies sniffing history of headaches in the past.  Denies any injury or trauma.  Patient states pain is described as an aching rated 6/10.  Patient states he is also feeling dizzy.  Patient denies any lightheadedness.  Denies visual changes or speech disturbances.  Patient is also had some tremors to his bilateral hands which are new.  Denies any weakness.  Denies any numbness or tingling.  Denies chest pain, shortness of breath, abdominal pain, nausea/vomiting, urinary symptoms or changes in bowel movements.  Denies fever chills.  Denies rashes or lesions.  Denies neck pain or stiffness.    Nursing Notes were all reviewed and agreed with or any disagreements were addressed  in the HPI.    REVIEW OF SYSTEMS    (2-9 systems for level 4, 10 or more for level 5)     Review of Systems   Constitutional:  Negative for activity change, appetite change, chills, diaphoresis and fever.   Eyes: Negative for photophobia and visual disturbance.   Respiratory: Negative.  Negative for cough and shortness of breath.    Cardiovascular: Negative.  Negative for chest pain, palpitations and leg swelling.   Gastrointestinal: Negative for abdominal pain, constipation, diarrhea, nausea and vomiting.   Genitourinary: Negative for decreased urine volume, difficulty urinating, dysuria, flank pain, frequency, hematuria and urgency.   Musculoskeletal: Negative for arthralgias, back pain, myalgias, neck pain and neck stiffness.   Skin: Negative for color change, pallor, rash and wound.   Neurological: Positive for dizziness, tremors and headaches. Negative for seizures, syncope, speech difficulty, weakness, light-headedness and numbness.       Positives and Pertinent negatives as per HPI.  Except as noted abovein the ROS, all other systems were reviewed and negative.       PAST MEDICAL HISTORY     Past Medical History:   Diagnosis Date   ??? Asthma    ??? Back pain    ??? Dyslipidemia    ??? GERD (gastroesophageal reflux disease)    ??? Hypothyroidism          SURGICAL HISTORY     Past Surgical History:   Procedure Laterality Date   ??? UPPER GASTROINTESTINAL ENDOSCOPY N/A 02/08/2018    EGD BIOPSY performed by Reginia Forts  Hassell Done, MD at [facility]       Discharge Medication List as of 04/05/2018  5:21 PM      CONTINUE these medications which have NOT CHANGED    Details   pantoprazole (PROTONIX) 40 MG tablet Take 1 tablet by mouth 2 times daily (before meals), Disp-60 tablet, R-2Print      albuterol sulfate HFA 108 (90 Base) MCG/ACT inhaler Inhale 2 puffs into the lungs every 4 hours as needed Historical Med      levothyroxine (SYNTHROID) 50 MCG tablet Take 50 mcg by mouth DailyHistorical Med      pravastatin (PRAVACHOL) 40 MG tablet Take 40 mg by mouth nightly Historical Med               ALLERGIES     Patient has no  known allergies.    FAMILYHISTORY     History reviewed. No pertinent family history.       SOCIAL HISTORY       Social History     Tobacco Use   ??? Smoking status: Former Smoker   ??? Smokeless tobacco: Never Used   Substance Use Topics   ??? Alcohol use: Not Currently   ??? Drug use: Never       SCREENINGS   NIH Stroke Scale  Interval: Baseline  Level of Consciousness (1a. ): Alert  LOC Questions (1b. ): Answers both correctly  LOC Commands (1c. ): Performs both tasks correctly  Best Gaze (2. ): Normal  Visual (3. ): No visual loss  Facial Palsy (4. ): Normal symmetrical movement  Motor Arm, Left (5a. ): No drift  Motor Arm, Right (5b. ): No drift  Motor Leg, Left (6a. ): No drift  Motor Leg, Right (6b. ): No drift  Limb Ataxia (7. ): Absent  Sensory (8. ): Normal  Best Language (9. ): No aphasia  Dysarthria (10. ): Normal  Extinction and Inattention (11): No abnormality  Total: 0Glasgow Coma Scale  Eye Opening: Spontaneous  Best Verbal Response: Oriented  Best Motor Response: Obeys commands  Glasgow Coma Scale Score: 15        PHYSICAL EXAM    (up to 7 for level 4, 8 or more for level 5)     ED Triage Vitals [04/05/18 1308]   BP Temp Temp Source Pulse Resp SpO2 Height Weight   (!) 140/85 98.7 ??F (37.1 ??C) Oral 84 18 95 % -- --       Physical Exam   Constitutional: He is oriented to person, place, and time. He appears well-developed and well-nourished. No distress.   HENT:   Head: Normocephalic and atraumatic.   Right Ear: External ear normal.   Left Ear: External ear normal.   Mouth/Throat: Oropharynx is clear and moist. No oropharyngeal exudate.   No tender palpation of the temporal arteries.  No globe tenderness.  EOMs intact.  PERRLA.   Eyes: Pupils are equal, round, and reactive to light. Conjunctivae and EOM are normal. Right eye exhibits no discharge. Left eye exhibits no discharge.   Neck: Normal range of motion. Neck supple. No JVD present.   No meningismus.  No lymphadenopathy.  No abnormal carotid pulses.  No  carotid bruits.  No JVD.   Cardiovascular: Normal rate, regular rhythm, normal heart sounds and intact distal pulses. Exam reveals no gallop and no friction rub.   No murmur heard.  2+ radial pulses bilaterally.  No pedal edema.  No calf tenderness.  No JVD.   Pulmonary/Chest: Effort normal and breath sounds normal. No stridor. No respiratory distress. He has no wheezes. He has no rales. He exhibits no tenderness.   Abdominal: Soft. Bowel sounds are normal. He exhibits no distension and no mass. There is no hepatosplenomegaly. There is no tenderness. There is no rigidity, no rebound, no guarding and no CVA tenderness. No hernia.   Musculoskeletal: Normal range of motion.   Lymphadenopathy:     He has no cervical adenopathy.   Neurological: He is alert and oriented to person, place, and time. He has normal strength. No cranial nerve deficit or sensory deficit. Gait normal. GCS eye subscore is 4. GCS verbal subscore is 5. GCS motor subscore is 6.   Skin: Skin is warm and dry. No rash noted. He is not diaphoretic. No erythema. No pallor.   Psychiatric: He has a normal mood and affect. His behavior is normal.       DIAGNOSTIC RESULTS   LABS:    Labs Reviewed   COMPREHENSIVE METABOLIC PANEL W/ REFLEX TO MG FOR LOW K - Abnormal; Notable for the following components:       Result Value    Glucose 167 (*)     All other components within normal limits    Narrative:     Performed at:  [facility]  [address]  [place] [address]   Phone [phone]   APTT - Abnormal; Notable for the following components:    aPTT 37.0 (*)     All other components within normal limits    Narrative:     Performed at:  [facility]  [address],  [place] [address]   Phone [phone]   CBC WITH AUTO DIFFERENTIAL    Narrative:     Performed at:  [facility]  [address],  [place] [address]   Phone [phone]   PROTIME-INR     Narrative:     Performed at:  [facility]  [address],  [place] [address]   Phone [phone]   SEDIMENTATION RATE    Narrative:     Performed at:  [facility]  [address]  [place] [address]   Phone [phone]   URINE RT REFLEX TO CULTURE    Narrative:     Performed at:  [facility]  [address]  [place] [address]   Phone [phone]   TROPONIN    Narrative:     Performed at:  [facility]  [address],  [place] [address]   Phone [phone]   BRAIN NATRIURETIC PEPTIDE    Narrative:     Performed at:  [facility]  [address],  [place] [address]   Phone [phone]       All other labs were within normal range or not returned as of this dictation.    EKG: All EKG's are interpreted by the Emergency Department Physician in the absence of a cardiologist.  Please see their note for interpretation of EKG.      RADIOLOGY:   Non-plain film images such as CT, Ultrasound and MRI are read by the radiologist. Plain radiographic images are visualized andpreliminarily interpreted by the  ED Provider with the  below findings:        Interpretation perthe Radiologist below, if available at the time of this note:    CTA HEAD NECK W CONTRAST   Final Result   Negative for hemodynamic stenosis or occlusion involving intracranial or   cervical arterial circulation.         CT Head WO Contrast   Final Result   No acute intracranial abnormality.         XR CHEST STANDARD (2 VW)   Final Result   No acute cardiopulmonary disease.           No results found.        PROCEDURES   Unless otherwise noted below, none     Procedures    CRITICAL CARE TIME   N/A    CONSULTS:  None      EMERGENCY DEPARTMENT COURSE and DIFFERENTIAL DIAGNOSIS/MDM:   Vitals:    Vitals:    04/05/18 1308 04/05/18 1545 04/05/18 1735   BP: (!) 140/85 132/77 129/74    Pulse: 84 77 64   Resp: '18 16 18   '$ Temp: 98.7 ??F (37.1 ??C) 98.4 ??F (36.9 ??C) 96.9 ??F (36.1 ??C)   TempSrc: Oral Infrared Oral   SpO2: 95% 98% 97%       Patient was given thefollowing medications:  Medications   metoclopramide (REGLAN) injection 10 mg (10 mg Intravenous Given 04/05/18 1410)   diphenhydrAMINE (BENADRYL) injection 25 mg (25 mg Intravenous Given 04/05/18 1411)   0.9 % sodium chloride bolus (0 mLs Intravenous Stopped 04/05/18 1546)   iopamidol (ISOVUE-370) 76 % injection 75 mL (75 mLs Intravenous Given 04/05/18 1458)       Patient is a 62 year old male who presents the implant of a headache.  Patient has headache to his bitemporal head.  Patient given Reglan, Benadryl and fluids here in the ED.  IV established and blood work obtained.  CBC showed normal white count, hemoglobin and platelets.  CMP showed glucose 167.  Coags unremarkable.  ESR 13.  Troponin normal.  BNP unremarkable.  EKG interpreted by attending.  Urinalysis unremarkable.  CT of the head and neck showed no acute normality.  CT of the head showed no acute normality.  Chest x-ray unremarkable.  Given history and physical examination suffering from headache.  Unclear etiology behind symptoms but given reassuring work-up here in the ED low suspicion for CVA, TIA, subarachnoid hemorrhage, subdural hematoma, meningitis, encephalitis, intracranial mass, intracranial hemorrhage, AAA, dissection, temporal arteritis, acute glaucoma, atypical migraine or other emergent etiology at this time.  Will discharge home with Fioricet and Zofran.  Follow-up with PCP.  Return to ED for any worsening symptoms.      FINAL IMPRESSION      1. Acute nonintractable headache, unspecified headache type          DISPOSITION/PLAN   DISPOSITION Decision To Discharge 04/05/2018 05:10:22 PM      PATIENT REFERREDTO:  Raechel Chute, APRN - CNP  [place] [phone]  [phone]    Schedule an appointment as soon as possible for a visit   For a  Re-check in  3-5    days.    [facility] Emergency Department  [address]  [place] [address]  [phone]  Go to   As needed, If symptoms worsen      DISCHARGE MEDICATIONS:  Discharge Medication List as of 04/05/2018  5:21 PM      START taking these medications  Details   ondansetron (ZOFRAN ODT) 4 MG disintegrating tablet Take 1 tablet by mouth every 8 hours as needed for Nausea, Disp-20 tablet, R-0Print      butalbital-APAP-caffeine (FIORICET) 50-300-40 MG CAPS per capsule Take 1 capsule by mouth every 6 hours as needed for Headaches, Disp-20 capsule, R-0Print             DISCONTINUED MEDICATIONS:  Discharge Medication List as of 04/05/2018  5:21 PM                 (Please note that portions ofthis note were completed with a voice recognition program.  Efforts were made to edit the dictations but occasionally words are mis-transcribed.)    Jeral Fruit, PA (electronically signed)          Chaya Jan, PA  04/05/18 2230

## 2018-04-05 NOTE — ED Provider Notes (Addendum)
This patient was seen by the Mid-Level Provider. I have seen and examined the patient, agree with the workup, evaluation, management and diagnosis. Care plan has been discussed. My assessment reveals a 63 year old male who presents with a headache.  This is a 62 year old male who speaks Nepali who presents with a family member who is translating.  The patient presents with a headache.  He describes as a frontal pulsating headache.  The patient states that he had a headache a week ago that felt similar.  This particular headaches been going on and off for the last several days.  It is not a severe headache.  He described as a 5 on a 1-10 scale.  He denies any neck stiffness.  He denies any fevers or chills or recent travel.  He denies any visual disturbances.  The patient's work-up today was extensive including laboratory results and a CTA of the head that was negative and unremarkable.    EKG: Sinus rhythm at a rate of 67 beats a minute with no acute ST elevations or depressions or pathologic Q waves.    Exam: Well-nourished male in no acute distress.  Neurologically he was alert and oriented with no focal neurological motor cc deficits throughout.  He was nontoxic-appearing.    MDM: 62 year old male presents with headache.  The headache is not severe.  Patient is not toxic appearing.  There are no meningeal signs on examination.  A CTa was unremarkable.  The patient be discharged with appropriate analgesics and instructed to return if worse.    Results for orders placed or performed during the hospital encounter of 04/05/18   CBC Auto Differential   Result Value Ref Range    WBC 7.3 4.0 - 11.0 K/uL    RBC 4.64 4.20 - 5.90 M/uL    Hemoglobin 13.7 13.5 - 17.5 g/dL    Hematocrit 16.1 09.6 - 52.5 %    MCV 89.9 80.0 - 100.0 fL    MCH 29.6 26.0 - 34.0 pg    MCHC 33.0 31.0 - 36.0 g/dL    RDW 04.5 40.9 - 81.1 %    Platelets 183 135 - 450 K/uL    MPV 8.1 5.0 - 10.5 fL    Neutrophils % 76.5 %    Lymphocytes % 15.0 %     Monocytes % 6.9 %    Eosinophils % 0.9 %    Basophils % 0.7 %    Neutrophils Absolute 5.6 1.7 - 7.7 K/uL    Lymphocytes Absolute 1.1 1.0 - 5.1 K/uL    Monocytes Absolute 0.5 0.0 - 1.3 K/uL    Eosinophils Absolute 0.1 0.0 - 0.6 K/uL    Basophils Absolute 0.1 0.0 - 0.2 K/uL   Comprehensive Metabolic Panel w/ Reflex to MG   Result Value Ref Range    Sodium 139 136 - 145 mmol/L    Potassium reflex Magnesium 3.7 3.5 - 5.1 mmol/L    Chloride 104 99 - 110 mmol/L    CO2 22 21 - 32 mmol/L    Anion Gap 13 3 - 16    Glucose 167 (H) 70 - 99 mg/dL    BUN 10 7 - 20 mg/dL    CREATININE 1.0 0.8 - 1.3 mg/dL    GFR Non-African American >60 >60    GFR African American >60 >60    Calcium 9.1 8.3 - 10.6 mg/dL    Total Protein 6.9 6.4 - 8.2 g/dL    Alb 4.0 3.4 - 5.0 g/dL  Albumin/Globulin Ratio 1.4 1.1 - 2.2    Total Bilirubin 0.6 0.0 - 1.0 mg/dL    Alkaline Phosphatase 83 40 - 129 U/L    ALT 17 10 - 40 U/L    AST 23 15 - 37 U/L    Globulin 2.9 g/dL   Protime-INR   Result Value Ref Range    Protime 11.6 9.8 - 13.0 sec    INR 1.02 0.86 - 1.14   APTT   Result Value Ref Range    aPTT 37.0 (H) 26.0 - 36.0 sec   Sedimentation Rate   Result Value Ref Range    Sed Rate 13 0 - 20 mm/Hr   Urinalysis Reflex to Culture   Result Value Ref Range    Color, UA YELLOW Straw/Yellow    Clarity, UA Clear Clear    Glucose, Ur Negative Negative mg/dL    Bilirubin Urine Negative Negative    Ketones, Urine Negative Negative mg/dL    Specific Gravity, UA 1.025 1.005 - 1.030    Blood, Urine Negative Negative    pH, UA 7.0 5.0 - 8.0    Protein, UA Negative Negative mg/dL    Urobilinogen, Urine 0.2 <2.0 E.U./dL    Nitrite, Urine Negative Negative    Leukocyte Esterase, Urine Negative Negative    Microscopic Examination Not Indicated     Urine Type Voided     Urine Reflex to Culture Not Indicated    Troponin   Result Value Ref Range    Troponin <0.01 <0.01 ng/mL   Brain Natriuretic Peptide   Result Value Ref Range    Pro-BNP 105 0 - 124 pg/mL   EKG 12 Lead    Result Value Ref Range    Ventricular Rate 67 BPM    Atrial Rate 67 BPM    P-R Interval 188 ms    QRS Duration 86 ms    Q-T Interval 396 ms    QTc Calculation (Bazett) 418 ms    P Axis 34 degrees    R Axis -5 degrees    T Axis 4 degrees    Diagnosis Normal sinus rhythm      Xr Chest Standard (2 Vw)    Result Date: 04/05/2018  EXAMINATION: TWO XRAY VIEWS OF THE CHEST 04/05/2018 2:47 pm COMPARISON: 02/07/2018 HISTORY: ORDERING SYSTEM PROVIDED HISTORY: dizziness TECHNOLOGIST PROVIDED HISTORY: Reason for exam:->dizziness Reason for Exam: Headache (headache, dizziness, shaking hand that started 3 days ago. ) Acuity: Acute Type of Exam: Initial FINDINGS: The cardiac silhouette, mediastinal and hilar contours are normal.  The lungs are clear.  There is chronic elevation of the right hemidiaphragm.  There are no pleural effusions. No acute osseous abnormalities are identified.     No acute cardiopulmonary disease.     Ct Head Wo Contrast    Result Date: 04/05/2018  EXAMINATION: CT OF THE HEAD WITHOUT CONTRAST  04/05/2018 11:59 am TECHNIQUE: CT of the head was performed without the administration of intravenous contrast. Dose modulation, iterative reconstruction, and/or weight based adjustment of the mA/kV was utilized to reduce the radiation dose to as low as reasonably achievable. COMPARISON: None. HISTORY: ORDERING SYSTEM PROVIDED HISTORY: HA TECHNOLOGIST PROVIDED HISTORY: Has a "code stroke" or "stroke alert" been called?->No Reason for exam:->HA Reason for Exam: headache Acuity: Unknown Type of Exam: Unknown FINDINGS: BRAIN/VENTRICLES: No acute loss of the gray-white matter differentiation is identified to suggest acute or subacute infarct.  No masses or hemorrhages within the brain parenchyma are found. No evidence of midline shift.  The intracranial vasculature, including the dural venous sinuses, is within normal limits. ORBITS: No acute orbital abnormalities are identified. SINUSES: The visualized paranasal sinuses  and mastoid air cells are clear. SOFT TISSUES/SKULL:  The calvarium is intact.  Extracranial soft tissues are unremarkable.     No acute intracranial abnormality.     Cta Head Neck W Contrast    Result Date: 04/05/2018  EXAMINATION: CTA OF THE HEAD AND NECK WITH CONTRAST 04/05/2018 2:58 pm: TECHNIQUE: CTA of the head and neck was performed with the administration of intravenous contrast. Multiplanar reformatted images are provided for review.  MIP images are provided for review. Stenosis of the internal carotid arteries measured using NASCET criteria. Dose modulation, iterative reconstruction, and/or weight based adjustment of the mA/kV was utilized to reduce the radiation dose to as low as reasonably achievable. COMPARISON: CT head 04/05/2018 HISTORY: ORDERING SYSTEM PROVIDED HISTORY: HA - dizziness - new tremor TECHNOLOGIST PROVIDED HISTORY: Reason for exam:->HA - dizziness - new tremor Reason for Exam: Headache (headache, dizziness, shaking hand that started 3 days ago. ) Acuity: Unknown Type of Exam: Unknown FINDINGS: CTA NECK: AORTIC ARCH/ARCH VESSELS: There is a normal branch pattern of the aortic arch. No significant stenosis is seen of the innominate artery or subclavian arteries. CAROTID ARTERIES: The common carotid arteries are normal in appearance without evidence of a flow limiting stenosis. The internal carotid arteries are normal in appearance without evidence of a flow limiting stenosis by NASCET criteria. No dissection or arterial injury is seen. VERTEBRAL ARTERIES: The vertebral arteries both arise from the subclavian arteries and are normal in caliber without evidence of flow limiting stenosis or dissection. SOFT TISSUES: The lung apices are clear.  No cervical or superior mediastinal lymphadenopathy.  The visualized portion of the larynx and pharynx appear unremarkable.  The parotid, submandibular and thyroid glands demonstrate no acute abnormality. BONES: The visualized osseous structures appear  unremarkable. CTA HEAD: ANTERIOR CIRCULATION: The internal carotid arteries are normal in course and caliber without focal stenosis.  Left A1 segment is hypoplastic versus aplastic.  A-comm patent.  The anterior cerebral and middle cerebral arteries demonstrate no focal stenosis. POSTERIOR CIRCULATION: The posterior cerebral arteries demonstrate no focal stenosis. The vertebral and basilar arteries appear unremarkable. BRAIN: No mass effect or midline shift. No abnormal extra-axial fluid collection. The gray-white differentiation appears grossly maintained. Generalized involutional change.  Mild chronic small ischemic changes. Intracranial vascular calcifications.  Left maxillary sinus disease.  Minimal ethmoid sinus disease.     Negative for hemodynamic stenosis or occlusion involving intracranial or cervical arterial circulation.         Joan Flores, MD  04/05/18 1704       Joan Flores, MD  04/05/18 [phone]

## 2018-04-05 NOTE — ED Notes (Signed)
Discharge instructions discussed with no questions or concerns with pt's. Family member per pt. Request. Family member verbalizes understanding to follow up with PCP. Family member given two prescriptions denies any questions concerning them. Pt. Ambulatory upon discharge with no signs of distress noted.       Alfonse Flavors, RN  04/05/18 [phone]

## 2018-04-05 NOTE — ED Notes (Signed)
Pt. To CT     Susie Cassette, RN  04/05/18 1504

## 2018-04-05 NOTE — ED Notes (Signed)
Pt. Reporting headache "a lot better." Photophobia decreased as well. Pt. Updated on plan of care and pending CT results. Will continue to monitor.      Susie Cassette, RN  04/05/18 [phone]

## 2018-04-05 NOTE — ED Notes (Signed)
Pt. placed in gown and on telemetry monitor per protocol. EKG completed and shown to MD at this time. Updated vs obtained. Bed locked with side rails up x2. Call light within reach. No other needs verbalized at this time. Will continue to monitor.          Susie Cassette, RN  04/05/18 1414

## 2018-04-05 NOTE — ED Notes (Signed)
Assumed care of PT.     Westley Foots, RN  04/05/18 [phone]

## 2018-04-06 LAB — EKG 12-LEAD
Atrial Rate: 67 {beats}/min
P Axis: 34 degrees
P-R Interval: 188 ms
Q-T Interval: 396 ms
QRS Duration: 86 ms
QTc Calculation (Bazett): 418 ms
R Axis: -5 degrees
T Axis: 4 degrees
Ventricular Rate: 67 {beats}/min

## 2018-06-11 ENCOUNTER — Inpatient Hospital Stay: Admit: 2018-06-11 | Payer: PRIVATE HEALTH INSURANCE | Primary: Adult Health

## 2018-06-11 ENCOUNTER — Encounter

## 2018-06-11 ENCOUNTER — Inpatient Hospital Stay: Payer: PRIVATE HEALTH INSURANCE | Primary: Adult Health

## 2018-06-11 DIAGNOSIS — R072 Precordial pain: Secondary | ICD-10-CM

## 2018-06-11 DIAGNOSIS — W108XXS Fall (on) (from) other stairs and steps, sequela: Secondary | ICD-10-CM

## 2018-06-23 ENCOUNTER — Emergency Department: Admit: 2018-06-23 | Payer: MEDICAID | Primary: Adult Health

## 2018-06-23 ENCOUNTER — Inpatient Hospital Stay
Admit: 2018-06-23 | Discharge: 2018-06-23 | Disposition: A | Discharge status: Home / Self Care | Payer: MEDICAID | Attending: Emergency Medicine

## 2018-06-23 DIAGNOSIS — R51 Headache: Secondary | ICD-10-CM

## 2018-06-23 LAB — HEPATIC FUNCTION PANEL
ALT: 14 U/L (ref 10–40)
AST: 19 U/L (ref 15–37)
Albumin: 4.3 g/dL (ref 3.4–5.0)
Alkaline Phosphatase: 79 U/L (ref 40–129)
Bilirubin, Direct: <0.2 mg/dL (ref 0.0–0.3)
Total Bilirubin: 0.7 mg/dL (ref 0.0–1.0)
Total Protein: 7.4 g/dL (ref 6.4–8.2)

## 2018-06-23 LAB — CBC WITH AUTO DIFFERENTIAL
Basophils %: 0.8 %
Basophils Absolute: 0 10*3/uL (ref 0.0–0.2)
Eosinophils %: 1 %
Eosinophils Absolute: 0.1 10*3/uL (ref 0.0–0.6)
Hematocrit: 47.5 % (ref 40.5–52.5)
Hemoglobin: 16 g/dL (ref 13.5–17.5)
Lymphocytes %: 34.3 %
Lymphocytes Absolute: 1.9 10*3/uL (ref 1.0–5.1)
MCH: 30.2 pg (ref 26.0–34.0)
MCHC: 33.7 g/dL (ref 31.0–36.0)
MCV: 89.7 fL (ref 80.0–100.0)
MPV: 8.2 fL (ref 5.0–10.5)
Monocytes %: 7.2 %
Monocytes Absolute: 0.4 10*3/uL (ref 0.0–1.3)
Neutrophils %: 56.7 %
Neutrophils Absolute: 3.1 10*3/uL (ref 1.7–7.7)
Platelets: 252 10*3/uL (ref 135–450)
RBC: 5.3 M/uL (ref 4.20–5.90)
RDW: 14 % (ref 12.4–15.4)
WBC: 5.5 10*3/uL (ref 4.0–11.0)

## 2018-06-23 LAB — URINALYSIS WITH REFLEX TO CULTURE
Bilirubin Urine: NEGATIVE
Blood, Urine: NEGATIVE
Glucose, Ur: NEGATIVE mg/dL
Ketones, Urine: NEGATIVE mg/dL
Leukocyte Esterase, Urine: NEGATIVE
Nitrite, Urine: NEGATIVE
Protein, UA: NEGATIVE mg/dL
Specific Gravity, UA: 1.014 (ref 1.005–1.030)
Urobilinogen, Urine: 0.2 E.U./dL (ref <2.0)
pH, UA: 7.5 (ref 5.0–8.0)

## 2018-06-23 LAB — BASIC METABOLIC PANEL
Anion Gap: 14 (ref 3–16)
BUN: 10 mg/dL (ref 7–20)
CO2: 19 mmol/L — ABNORMAL LOW (ref 21–32)
Calcium: 9.5 mg/dL (ref 8.3–10.6)
Chloride: 105 mmol/L (ref 99–110)
Creatinine: 0.9 mg/dL (ref 0.8–1.3)
GFR African American: >60 (ref >60)
GFR Non-African American: >60 (ref >60)
Glucose: 106 mg/dL — ABNORMAL HIGH (ref 70–99)
Potassium: 3.9 mmol/L (ref 3.5–5.1)
Sodium: 138 mmol/L (ref 136–145)

## 2018-06-23 LAB — LIPASE: Lipase: 27 U/L (ref 13.0–60.0)

## 2018-06-23 LAB — TROPONIN: Troponin: <0.01 ng/mL (ref <0.01)

## 2018-06-23 MED ORDER — FAMOTIDINE 20 MG PO TABS
20 MG | ORAL_TABLET | Freq: Two times a day (BID) | ORAL | 0 refills | Status: DC
Start: 2018-06-23 — End: 2020-08-30

## 2018-06-23 MED ORDER — ONDANSETRON HCL 4 MG PO TABS
4 MG | ORAL_TABLET | Freq: Three times a day (TID) | ORAL | 0 refills | Status: DC | PRN
Start: 2018-06-23 — End: 2020-12-30

## 2018-06-23 MED ORDER — METOCLOPRAMIDE HCL 5 MG/ML IJ SOLN
5 MG/ML | Freq: Once | INTRAMUSCULAR | Status: AC
Start: 2018-06-23 — End: 2018-06-23
  Administered 2018-06-23: 21:00:00 10 mg via INTRAVENOUS

## 2018-06-23 MED ORDER — SODIUM CHLORIDE 0.9 % IV BOLUS
0.9 % | Freq: Once | INTRAVENOUS | Status: AC
Start: 2018-06-23 — End: 2018-06-23
  Administered 2018-06-23: 21:00:00 1000 mL via INTRAVENOUS

## 2018-06-23 MED ORDER — HYDROXYZINE PAMOATE 25 MG PO CAPS
25 MG | ORAL_CAPSULE | Freq: Three times a day (TID) | ORAL | 0 refills | Status: AC | PRN
Start: 2018-06-23 — End: ?

## 2018-06-23 MED ORDER — IOPAMIDOL 76 % IV SOLN
76 % | Freq: Once | INTRAVENOUS | Status: AC | PRN
Start: 2018-06-23 — End: 2018-06-23
  Administered 2018-06-23: 20:00:00 75 mL via INTRAVENOUS

## 2018-06-23 MED ORDER — DIPHENHYDRAMINE HCL 50 MG/ML IJ SOLN
50 MG/ML | Freq: Once | INTRAMUSCULAR | Status: AC
Start: 2018-06-23 — End: 2018-06-23
  Administered 2018-06-23: 21:00:00 25 mg via INTRAVENOUS

## 2018-06-23 MED ORDER — FAMOTIDINE 20 MG/2ML IV SOLN
20 MG/2ML | Freq: Once | INTRAVENOUS | Status: AC
Start: 2018-06-23 — End: 2018-06-23
  Administered 2018-06-23: 21:00:00 20 mg via INTRAVENOUS

## 2018-06-23 MED FILL — METOCLOPRAMIDE HCL 5 MG/ML IJ SOLN: 5 mg/mL | INTRAMUSCULAR | Qty: 2

## 2018-06-23 MED FILL — DIPHENHYDRAMINE HCL 50 MG/ML IJ SOLN: 50 mg/mL | INTRAMUSCULAR | Qty: 1

## 2018-06-23 MED FILL — FAMOTIDINE 20 MG/2ML IV SOLN: 20 MG/2ML | INTRAVENOUS | Qty: 2

## 2018-06-23 NOTE — ED Notes (Signed)
Using language line nephali interpreter [phone] discussed medications being administered to patient and need to collect urine specimen.  Pat c/o headache and chest pain "for a long time".  Patient unable to void per urinal, patient straight cathed for urine specimen.  Patient up to bathroom following catherization.  IV fluid infusing as ordered.     Renaldo Reel Daiel Strohecker, RN  06/23/18 1622

## 2018-06-23 NOTE — ED Provider Notes (Signed)
Nepali language line [phone]          [facility] EMERGENCY DEPARTMENT  EMERGENCY DEPARTMENT ENCOUNTER        Pt Name: Michael Knight  MRN: [id number]  Birthdate 1956/03/31  Date of evaluation: 06/23/2018  Provider: Patrcia Dolly, PA-C  PCP: Ulice Bold, APRN - CNP    This patient was seen and evaluated by the attending physician Dr. Nolen Mu      CHIEF COMPLAINT       Chief Complaint   Patient presents with   ??? Headache     WITH DIZZINESS AND ABDOMINAL PAIN. + N/V. DENIES DIARRHEA       HISTORY OF PRESENT ILLNESS   (Location/Symptom, Timing/Onset, Context/Setting, Quality, Duration, Modifying Factors, Severity)  Note limiting factors.     Michael Knight is a 63 y.o. male who presents to the emergency department with complaints of phonophobia, dizziness, generalized abdominal pain, nausea, vomiting, chest discomfort and cough for 1 week.  He was recently diagnosed with UTI and placed on ciprofloxacin which she is still taking.  He rates his overall discomfort to be an 8 out of 10 on pain scale.  He denies hemoptysis, palpitations, pleuritic sharp chest pain, diarrhea, constipation, bloody or black tarry stool, acute vision changes, double vision, neck pain or stiffness.  He is lying comfortably in bed but does appear anxious.    Nursing Notes were all reviewed and agreed with or any disagreements were addressed in the HPI.    REVIEW OF SYSTEMS    (2-9 systems for level 4, 10 or more for level 5)     Review of Systems   Constitutional: Negative for chills and fever.   HENT: Negative.    Eyes: Negative for photophobia, pain, discharge, redness, itching and visual disturbance.   Respiratory: Positive for cough. Negative for shortness of breath, wheezing and stridor.    Cardiovascular: Positive for chest pain. Negative for palpitations and leg swelling.   Gastrointestinal: Positive for abdominal pain, nausea and vomiting. Negative for abdominal distention, anal bleeding, blood in stool, constipation,  diarrhea and rectal pain.   Endocrine: Negative.    Genitourinary: Negative.    Musculoskeletal: Negative for back pain, neck pain and neck stiffness.   Skin: Negative for color change, pallor, rash and wound.   Neurological: Positive for dizziness and headaches. Negative for tremors, seizures, syncope, facial asymmetry, speech difficulty, weakness, light-headedness and numbness.   Psychiatric/Behavioral: Negative for confusion.   All other systems reviewed and are negative.      Positives and Pertinent negatives as per HPI.  Except as noted above in the ROS, all other systems were reviewed and negative.       PAST MEDICAL HISTORY     Past Medical History:   Diagnosis Date   ??? Asthma    ??? Back pain    ??? Dyslipidemia    ??? GERD (gastroesophageal reflux disease)    ??? Hypothyroidism          SURGICAL HISTORY     Past Surgical History:   Procedure Laterality Date   ??? UPPER GASTROINTESTINAL ENDOSCOPY N/A 02/08/2018    EGD BIOPSY performed by Kandice Robinsons, MD at [facility] ENDOSCOPY         CURRENTMEDICATIONS       Previous Medications    ALBUTEROL SULFATE HFA 108 (90 BASE) MCG/ACT INHALER    Inhale 2 puffs into the lungs every 4 hours as needed     BUTALBITAL-APAP-CAFFEINE (FIORICET) 50-300-40  MG CAPS PER CAPSULE    Take 1 capsule by mouth every 6 hours as needed for Headaches    LEVOTHYROXINE (SYNTHROID) 50 MCG TABLET    Take 50 mcg by mouth Daily    PANTOPRAZOLE (PROTONIX) 40 MG TABLET    Take 1 tablet by mouth 2 times daily (before meals)    PRAVASTATIN (PRAVACHOL) 40 MG TABLET    Take 40 mg by mouth nightly          ALLERGIES     Patient has no known allergies.    FAMILYHISTORY     History reviewed. No pertinent family history.       SOCIAL HISTORY       Social History     Tobacco Use   ??? Smoking status: Former Smoker   ??? Smokeless tobacco: Never Used   Substance Use Topics   ??? Alcohol use: Not Currently   ??? Drug use: Never       SCREENINGS             PHYSICAL EXAM    (up to 7 for level 4, 8 or more for level 5)      ED Triage Vitals [06/23/18 1350]   BP Temp Temp Source Pulse Resp SpO2 Height Weight   (!) 131/91 97.3 ??F (36.3 ??C) Oral 61 20 99 % 5\' 4"  (1.626 m) 142 lb (64.4 kg)       Physical Exam  Vitals signs and nursing note reviewed.   Constitutional:       Appearance: Normal appearance. He is well-developed. He is not toxic-appearing or diaphoretic.   HENT:      Head: Normocephalic and atraumatic.      Jaw: There is normal jaw occlusion.      Salivary Glands: Right salivary gland is not diffusely enlarged or tender. Left salivary gland is not diffusely enlarged or tender.      Right Ear: Hearing, tympanic membrane, ear canal and external ear normal.      Left Ear: Hearing, tympanic membrane, ear canal and external ear normal.      Nose: Nose normal.      Right Sinus: No maxillary sinus tenderness or frontal sinus tenderness.      Left Sinus: No maxillary sinus tenderness or frontal sinus tenderness.      Mouth/Throat:      Lips: Pink.      Mouth: Mucous membranes are moist.      Dentition: No dental tenderness.      Tongue: No lesions. Tongue does not protrude in midline.      Palate: No mass and lesions. Palate does not elevate in midline.      Pharynx: Oropharynx is clear. Uvula midline.      Tonsils: No tonsillar exudate or tonsillar abscesses. Swelling: 1+ on the right. 1+ on the left.   Eyes:      General: No scleral icterus.        Right eye: No discharge.         Left eye: No discharge.      Extraocular Movements: Extraocular movements intact.      Conjunctiva/sclera: Conjunctivae normal.      Pupils: Pupils are equal, round, and reactive to light.   Neck:      Musculoskeletal: Normal range of motion.   Cardiovascular:      Rate and Rhythm: Regular rhythm. Bradycardia present.   Pulmonary:      Effort: Pulmonary effort is normal.  Breath sounds: Normal breath sounds.   Abdominal:      General: Bowel sounds are normal. There is no abdominal bruit.      Palpations: Abdomen is soft. There is no pulsatile mass.       Tenderness: There is generalized tenderness. There is no right CVA tenderness, left CVA tenderness, guarding or rebound. Negative signs include Murphy's sign, Rovsing's sign, McBurney's sign, psoas sign and obturator sign.   Musculoskeletal: Normal range of motion.      Comments: No extremity edema, posterior calf or thigh tenderness, palpable cord, discoloration.  Negative homans.    Skin:     General: Skin is warm and dry.      Capillary Refill: Capillary refill takes less than 2 seconds.      Coloration: Skin is not jaundiced or pale.      Findings: No bruising, erythema, lesion or rash.   Neurological:      General: No focal deficit present.      Mental Status: He is alert and oriented to person, place, and time.      Cranial Nerves: No cranial nerve deficit (II-XII Intact).      Sensory: No sensory deficit.      Motor: No weakness.      Coordination: Coordination normal.      Gait: Gait normal.      Deep Tendon Reflexes: Reflexes normal.      Comments: No pronator drift, facial droop or slurred speech.   Normal finger to nose coordination.   Normal rapid alternating hand movement.   Normal heel to shin coordination   Dix Hallpike negative without nystagmus.   5 out of 5 strength in all 4 extremities without focal weakness, paresthesia or radiculopathy.   Psychiatric:         Attention and Perception: Attention and perception normal.         Mood and Affect: Mood is anxious.         Speech: Speech normal.         Behavior: Behavior normal. Behavior is cooperative.         Thought Content: Thought content normal.         Cognition and Memory: Cognition normal.         Judgment: Judgment normal.         DIAGNOSTIC RESULTS   LABS:    Labs Reviewed   BASIC METABOLIC PANEL - Abnormal; Notable for the following components:       Result Value    CO2 19 (*)     Glucose 106 (*)     All other components within normal limits    Narrative:     Performed at:  [facility]  [address]   [place], [place] [address]   Phone [phone]   TROPONIN    Narrative:     Performed at:  [facility]  [address],  [place], [place] [address]   Phone [phone]   CBC WITH AUTO DIFFERENTIAL    Narrative:     Performed at:  [facility]  [address],  [place], [place] [address]   Phone [phone]   HEPATIC FUNCTION PANEL    Narrative:     Performed at:  [facility]  [address],  [place], [place] [address]   Phone [phone]   LIPASE    Narrative:     Performed  at:  [facility]  [address]  [place], [place] [address]   Phone [phone]   URINE RT REFLEX TO CULTURE    Narrative:     Performed at:  [facility]  [address]  [place], [place] [address]   Phone [phone]       All other labs were within normal range or not returned as of this dictation.    EKG: All EKG's are interpreted by the Emergency Department Physician in the absence of a cardiologist.  Please see their note for interpretation of EKG.      RADIOLOGY:   Non-plain film images such as CT, Ultrasound and MRI are read by the radiologist. Plain radiographic images are visualized and preliminarily interpreted by the  ED Provider with the below findings:        Interpretation per the Radiologist below, if available at the time of this note:    CT HEAD WO CONTRAST   Final Result   No acute intracranial abnormality.         CT ABDOMEN PELVIS W IV CONTRAST Additional Contrast? None   Final Result   1. Study limited by motion artifact.   2. No acute infective or inflammatory process.   3. No evidence for bowel obstruction.         XR CHEST STANDARD (2 VW)   Final Result   Stable exam.  No new acute cardiopulmonary findings.           Xr Chest Standard (2 Vw)    Result Date: 06/23/2018  EXAMINATION: TWO XRAY VIEWS OF THE CHEST 06/23/2018 2:09 pm COMPARISON: 04/05/2018 HISTORY: ORDERING SYSTEM  PROVIDED HISTORY: CP TECHNOLOGIST PROVIDED HISTORY: Reason for exam:->CP Reason for Exam: CP Acuity: Unknown FINDINGS: Low lung volumes.  No focal consolidation, pleural effusion or pneumothorax. The cardiomediastinal silhouette is stable.  No overt pulmonary edema.  The osseous structures are stable.     Stable exam.  No new acute cardiopulmonary findings.           PROCEDURES   Unless otherwise noted below, none     Procedures    CRITICAL CARE TIME   N/A    CONSULTS:  None      EMERGENCY DEPARTMENT COURSE and DIFFERENTIAL DIAGNOSIS/MDM:   Vitals:    Vitals:    06/23/18 1530 06/23/18 1538 06/23/18 1600 06/23/18 1622   BP:  129/71 121/72    Pulse: 60 63 56    Resp: 28 18 (!) 32 25   Temp:       TempSrc:       SpO2: 100% 95% 100%    Weight:       Height:           Patient was given thefollowing medications:  Medications   0.9 % sodium chloride bolus (1,000 mLs Intravenous New Bag 06/23/18 1553)   metoclopramide (REGLAN) injection 10 mg (10 mg Intravenous Given 06/23/18 1553)   diphenhydrAMINE (BENADRYL) injection 25 mg (25 mg Intravenous Given 06/23/18 1553)   famotidine (PEPCID) injection 20 mg (20 mg Intravenous Given 06/23/18 1553)   iopamidol (ISOVUE-370) 76 % injection 75 mL (75 mLs Intravenous Given 06/23/18 1512)     This patient presents to the emergency department with complaints of headache, phonophobia, chest discomfort, cough, abdominal pain, nausea, vomiting.  He is neurologically intact.  Urinalysis does not reveal evidence of infection.  Blood work is stable.  EKG is unremarkable.  CT  head stable.  CT abdomen and pelvis unremarkable.  Chest x-ray unremarkable.  We do feel that this patient can be safely discharged home.  He does appear very anxious, therefore be sent home with Vistaril to not only address his anxiety but also his congestion.  Will also be sent home with medication for his other symptoms.  Advised to follow-up with PCP for recheck and may return to ED per discharge instructions.  My  suspicion is low for cerebellar compromise, posterior stroke, pneumonia, pneumothorax, influenza,ACS, PE, myocarditis, pericarditis, endocarditis, acute pulmonary edema, pleural effusion, pericardial effusion, cardiac tamponade, cardiomyopathy, CHF exacerbation, thoracic aortic dissection, esophageal rupture, other life-threatening arrhythmia, hypertensive urgency or emergency, hemothorax, pulmonary contusion, subcutaneous emphysema, flail chest, pneumo mediastinum, rib fracture , carotid dissection, sinus abscess, acute fracture, acute CVA, ICH, SAH, TIA, meningitis, encephalitis, pseudotumor cerebri, temporal arteritis, sentinel bleed from ruptured aneurysm, hypertensive urgency or emergency, subdural hematoma, epidural hematoma, acute surgical abdomen, obstruction, perforation, abscess, mesenteric ischemia, AAA, dissection, cholecystitis, cholangitis, pancreatitis, appendicitis, C. diff colitis, diverticulitis, volvulus, incarcerated hernia, necrotizing fasciitis, TOA, ovarian torsion, PID, ectopic pregnancy, fitz hugh Curtis syndrome, testicular torsion, epididymitis, varicocele, hydrocele, orchitis, incarcerated hernia, Fournier gangrene, pyelonephritis, perinephric abscess, kidney stone, urosepsis, fistula, otitis infection, trench mouth, Ludwig angina, dental abscess,acute pharyngitis, peritonsillar or tonsillar abscess, retropharyngeal abscess, bacterial tracheitis, epiglottitis, meningitis, encephalitis, foreign body, angioedema, anaphylaxis, mononucleosis, mumps, lymphoma, leukemia, asthma exacerbation, osteomyelitis, mastoiditis, sepsis, DKA, or other concerning pathology. We have addressed concerns and expectations.                 FINAL IMPRESSION      1. Acute nonintractable headache, unspecified headache type    2. Generalized abdominal pain    3. Acute upper respiratory infection    4. Chest discomfort    5. Non-intractable vomiting with nausea, unspecified vomiting type          DISPOSITION/PLAN    DISPOSITION  Decision to discharge      PATIENT REFERREDTO:  Ulice Bold, APRN - CNP  [address]  [facility] [address]  [phone]      for follow up in 1-3 days      DISCHARGE MEDICATIONS:  New Prescriptions    FAMOTIDINE (PEPCID) 20 MG TABLET    Take 1 tablet by mouth 2 times daily    HYDROXYZINE (VISTARIL) 25 MG CAPSULE    Take 1 capsule by mouth 3 times daily as needed (anxiety and congestion)    ONDANSETRON (ZOFRAN) 4 MG TABLET    Take 1 tablet by mouth every 8 hours as needed for Nausea       DISCONTINUED MEDICATIONS:  Discontinued Medications    ONDANSETRON (ZOFRAN ODT) 4 MG DISINTEGRATING TABLET    Take 1 tablet by mouth every 8 hours as needed for Nausea              (Please note that portions of this note were completed with a voice recognition program.  Efforts were made to edit the dictations but occasionally words are mis-transcribed.)    Patrcia Dolly, PA-C (electronically signed)           Patrcia Dolly, PA-C  06/23/18 1636

## 2018-06-23 NOTE — ED Notes (Signed)
Bed: 14  Expected date:   Expected time:   Means of arrival: Walk In  Comments:     Neomia Glass, RN  06/23/18 1356

## 2018-06-23 NOTE — ED Provider Notes (Signed)
This patient was seen by the Mid-Level Provider. I have seen and examined the patient, agree with the workup, evaluation, management and diagnosis. Care plan has been discussed. My assessment reveals a 63 year old male who presents with multiple complaints.  This is a 63 year old male who presents with some dizziness, abdominal pain, nausea vomiting, diarrhea and a headache.  The patient has had these symptoms for several days.  The patient's work-up today was extensive and unremarkable.  He looks good on exam.  The patient's work-up included a CT of the head, abdomen and chest x-ray that are all unremarkable and normal.  His CBC and chemistry profile were normal as well.  The patient is remained stable and well.  He will be discharged with instruction to follow-up with his primary care physician and to return if worse.  This appears to be viral in nature.    Exam: Well-nourished 63 year old male in no acute distress.  His fundi were sharp bilaterally.  His mucous remains moist and pink.  He was nontoxic-appearing.  His chest was clear to auscultation bilaterally.    MDM: 63 year old male presents with multiple complaints that appear to be viral in nature.  There appears to be no emergent condition.  He was discharged with referral back to his primary care physician and a drink plenty of fluids and return if worse.    EKG: Sinus bradycardia at a rate of 59 beats a minute with no acute ST elevations or depressions or pathologic Q waves.  Normal axis.    Results for orders placed or performed during the hospital encounter of 06/23/18   Basic Metabolic Panel   Result Value Ref Range    Sodium 138 136 - 145 mmol/L    Potassium 3.9 3.5 - 5.1 mmol/L    Chloride 105 99 - 110 mmol/L    CO2 19 (L) 21 - 32 mmol/L    Anion Gap 14 3 - 16    Glucose 106 (H) 70 - 99 mg/dL    BUN 10 7 - 20 mg/dL    CREATININE 0.9 0.8 - 1.3 mg/dL    GFR Non-African American >60 >60    GFR African American >60 >60    Calcium 9.5 8.3 - 10.6 mg/dL    Troponin   Result Value Ref Range    Troponin <0.01 <0.01 ng/mL   CBC Auto Differential   Result Value Ref Range    WBC 5.5 4.0 - 11.0 K/uL    RBC 5.30 4.20 - 5.90 M/uL    Hemoglobin 16.0 13.5 - 17.5 g/dL    Hematocrit 93.8 10.1 - 52.5 %    MCV 89.7 80.0 - 100.0 fL    MCH 30.2 26.0 - 34.0 pg    MCHC 33.7 31.0 - 36.0 g/dL    RDW 75.1 02.5 - 85.2 %    Platelets 252 135 - 450 K/uL    MPV 8.2 5.0 - 10.5 fL    Neutrophils % 56.7 %    Lymphocytes % 34.3 %    Monocytes % 7.2 %    Eosinophils % 1.0 %    Basophils % 0.8 %    Neutrophils Absolute 3.1 1.7 - 7.7 K/uL    Lymphocytes Absolute 1.9 1.0 - 5.1 K/uL    Monocytes Absolute 0.4 0.0 - 1.3 K/uL    Eosinophils Absolute 0.1 0.0 - 0.6 K/uL    Basophils Absolute 0.0 0.0 - 0.2 K/uL   Hepatic Function Panel   Result Value Ref Range  Total Protein 7.4 6.4 - 8.2 g/dL    Alb 4.3 3.4 - 5.0 g/dL    Alkaline Phosphatase 79 40 - 129 U/L    ALT 14 10 - 40 U/L    AST 19 15 - 37 U/L    Total Bilirubin 0.7 0.0 - 1.0 mg/dL    Bilirubin, Direct <0.9<0.2 0.0 - 0.3 mg/dL    Bilirubin, Indirect see below 0.0 - 1.0 mg/dL   Lipase   Result Value Ref Range    Lipase 27.0 13.0 - 60.0 U/L   Urinalysis Reflex to Culture   Result Value Ref Range    Color, UA YELLOW Straw/Yellow    Clarity, UA Clear Clear    Glucose, Ur Negative Negative mg/dL    Bilirubin Urine Negative Negative    Ketones, Urine Negative Negative mg/dL    Specific Gravity, UA 1.014 1.005 - 1.030    Blood, Urine Negative Negative    pH, UA 7.5 5.0 - 8.0    Protein, UA Negative Negative mg/dL    Urobilinogen, Urine 0.2 <2.0 E.U./dL    Nitrite, Urine Negative Negative    Leukocyte Esterase, Urine Negative Negative    Microscopic Examination Not Indicated     Urine Type NotGiven     Urine Reflex to Culture Not Indicated    EKG 12 Lead   Result Value Ref Range    Ventricular Rate 59 BPM    Atrial Rate 59 BPM    P-R Interval 144 ms    QRS Duration 86 ms    Q-T Interval 406 ms    QTc Calculation (Bazett) 401 ms    P Axis 68 degrees    R  Axis 26 degrees    T Axis -8 degrees    Diagnosis       Sinus bradycardia with sinus arrhythmiaRSR' or QR pattern in V1 suggests right ventricular conduction delayNonspecific T wave abnormalityAbnormal ECG     Xr Chest Standard (2 Vw)    Result Date: 06/23/2018  EXAMINATION: TWO XRAY VIEWS OF THE CHEST 06/23/2018 2:09 pm COMPARISON: 04/05/2018 HISTORY: ORDERING SYSTEM PROVIDED HISTORY: CP TECHNOLOGIST PROVIDED HISTORY: Reason for exam:->CP Reason for Exam: CP Acuity: Unknown FINDINGS: Low lung volumes.  No focal consolidation, pleural effusion or pneumothorax. The cardiomediastinal silhouette is stable.  No overt pulmonary edema.  The osseous structures are stable.     Stable exam.  No new acute cardiopulmonary findings.     Xr Sternum (min 2 Views)    Result Date: 06/11/2018  EXAMINATION: TWO XRAY VIEWS OF THE STERNUM 06/11/2018 11:44 am COMPARISON: None. HISTORY: ORDERING SYSTEM PROVIDED HISTORY: Fall down stairs, sequela TECHNOLOGIST PROVIDED HISTORY: Reason for Exam: sternum pain Acuity: Acute Type of Exam: Initial Mechanism of Injury: fall FINDINGS: No radiographic evidence of fracture of the sternum or the adjacent osseous structures.  No soft tissue abnormality.     Negative examination.     Ct Head Wo Contrast    Result Date: 06/23/2018  EXAMINATION: CT OF THE HEAD WITHOUT CONTRAST  06/23/2018 3:11 pm TECHNIQUE: CT of the head was performed without the administration of intravenous contrast. Dose modulation, iterative reconstruction, and/or weight based adjustment of the mA/kV was utilized to reduce the radiation dose to as low as reasonably achievable. COMPARISON: 04/05/2018 HISTORY: ORDERING SYSTEM PROVIDED HISTORY: headache TECHNOLOGIST PROVIDED HISTORY: Reason for exam:->headache Has a "code stroke" or "stroke alert" been called?->No Reason for Exam: Headache (WITH DIZZINESS AND ABDOMINAL PAIN. + N/V. DENIES DIARRHEA) FINDINGS: BRAIN/VENTRICLES: There is no acute  intracranial hemorrhage, mass effect or  midline shift.  No abnormal extra-axial fluid collection.  The gray-white differentiation is maintained without evidence of an acute infarct.  There is no evidence of hydrocephalus. ORBITS: The visualized portion of the orbits demonstrate no acute abnormality. SINUSES: The visualized paranasal sinuses and mastoid air cells demonstrate no acute abnormality. SOFT TISSUES/SKULL:  No acute abnormality of the visualized skull or soft tissues.     No acute intracranial abnormality.     Ct Abdomen Pelvis W Iv Contrast Additional Contrast? None    Result Date: 06/23/2018  EXAMINATION: CT OF THE ABDOMEN AND PELVIS WITH CONTRAST 06/23/2018 3:12 pm TECHNIQUE: CT of the abdomen and pelvis was performed with the administration of intravenous contrast. Multiplanar reformatted images are provided for review. Dose modulation, iterative reconstruction, and/or weight based adjustment of the mA/kV was utilized to reduce the radiation dose to as low as reasonably achievable. COMPARISON: 02/07/2018 HISTORY: ORDERING SYSTEM PROVIDED HISTORY: abd pain TECHNOLOGIST PROVIDED HISTORY: Reason for exam:->abd pain Additional Contrast?->None Reason for Exam: Headache (WITH DIZZINESS AND ABDOMINAL PAIN. + N/V. DENIES DIARRHEA) FINDINGS: Lower Chest: The visualized heart and lungs show no acute abnormalities. Organs: Evaluation degraded by motion artifact.  Liver, spleen, pancreas, kidneys, adrenal glands and gallbladder show no significant abnormalities. GI/Bowel: Degraded by motion artifact.  There is limited evaluation due to absence of oral contrast.  Small sliding hiatal hernia.  No evidence for small bowel obstruction.  No evidence for appendicitis.  Appendix is not visualized.  The colon shows no acute process. Pelvis: Urinary bladder grossly normal.  No suspicious pelvic mass. Peritoneum/Retroperitoneum: No free intraperitoneal fluid or significant lymphadenopathy.  Vascular structures enhance satisfactorily. Bones/Soft Tissues: No acute  abnormality of the bones.  The superficial soft tissues show no significant abnormalities.     1. Study limited by motion artifact. 2. No acute infective or inflammatory process. 3. No evidence for bowel obstruction.        Joan Flores, MD  06/23/18 [phone]

## 2018-06-23 NOTE — ED Notes (Signed)
Patient's son at the bedside to drive him home.  Reviewed written discharge instructions with patient, answered son's questions regarding medications and diet.  Patient denied questions and nodded understanding of follow up and instructions.  Patient ambulated out of ED without difficulty.     Marissa Nestle, RN  06/23/18 [phone]

## 2018-06-24 LAB — EKG 12-LEAD
Atrial Rate: 59 {beats}/min
P Axis: 68 degrees
P-R Interval: 144 ms
Q-T Interval: 406 ms
QRS Duration: 86 ms
QTc Calculation (Bazett): 401 ms
R Axis: 26 degrees
T Axis: -8 degrees
Ventricular Rate: 59 {beats}/min

## 2018-07-02 ENCOUNTER — Ambulatory Visit: Admit: 2018-07-02 | Discharge: 2018-07-02 | Payer: MEDICAID

## 2018-07-02 DIAGNOSIS — G5621 Lesion of ulnar nerve, right upper limb: Secondary | ICD-10-CM

## 2018-07-02 NOTE — Unmapped (Signed)
Chief Complaint   Patient presents with   ??? New Patient Visit/ Consultation     pain and weakness in R hand           This is a very pleasant 63 year old gentleman who is right-hand dominant. He's been having increasing numbness lasting/pain in his right arm significant tenderness over the ulnar nerve at the elbow.         ROS  Review of Systems   Constitutional: Negative.    HENT: Negative.    Eyes: Negative.    Respiratory: Negative.    Cardiovascular: Negative.    Gastrointestinal: Negative.    Genitourinary: Negative.    Musculoskeletal: Positive for joint pain.   Skin: Negative.    Neurological: Positive for tingling and sensory change.   Endo/Heme/Allergies: Negative.        History reviewed. No pertinent past medical history.      History reviewed. No pertinent surgical history.      Current Outpatient Medications   Medication Sig   ??? acetaminophen TK 1 T PO Q 12 H PRN   ??? albuterol    ??? amitriptyline Take 10 mg by mouth at bedtime.   ??? famotidine Take by mouth.   ??? hydrOXYzine pamoate Take by mouth.   ??? levothyroxine Take by mouth.   ??? montelukast    ??? naproxen    ??? ondansetron DIS ONE T PO  Q 8 H PRF NAUSEA.   ??? pantoprazole Take 40 mg by mouth daily.   ??? pravastatin Take by mouth.   ??? STOOL SOFTENER    ??? SUMAtriptan    ??? traZODone      No current facility-administered medications for this visit.          No Known Allergies      Social History     Socioeconomic History   ??? Marital status: Divorced     Spouse name: Not on file   ??? Number of children: Not on file   ??? Years of education: Not on file   ??? Highest education level: Not on file   Occupational History   ??? Not on file   Social Needs   ??? Financial resource strain: Not on file   ??? Food insecurity:     Worry: Not on file     Inability: Not on file   ??? Transportation needs:     Medical: Not on file     Non-medical: Not on file   Tobacco Use   ??? Smoking status: Never Smoker   ??? Smokeless tobacco: Never Used   Substance and Sexual Activity   ??? Alcohol use:  Never     Frequency: Never   ??? Drug use: Never   ??? Sexual activity: Not on file   Lifestyle   ??? Physical activity:     Days per week: Not on file     Minutes per session: Not on file   ??? Stress: Not on file   Relationships   ??? Social connections:     Talks on phone: Not on file     Gets together: Not on file     Attends religious service: Not on file     Active member of club or organization: Not on file     Attends meetings of clubs or organizations: Not on file     Relationship status: Not on file   ??? Intimate partner violence:     Fear of current or ex partner: Not on  file     Emotionally abused: Not on file     Physically abused: Not on file     Forced sexual activity: Not on file   Other Topics Concern   ??? Caffeine Use Not Asked   ??? Occupational Exposure Not Asked   ??? Exercise Not Asked   ??? Seat Belt Not Asked   Social History Narrative   ??? Not on file         The following portions of the patient's history were reviewed and updated as appropriate: allergies, current medications, past family history, past medical history, past social history, past surgical history and problem list.      Physical Exam  Physical Exam   Constitutional: Patient is oriented to person, place, and time; appears well-developed and well-nourished.   HENT:   Head: Normocephalic and atraumatic.   Eyes: Conjunctivae and EOM are normal.   Cardiovascular: Intact distal pulses.    Pulmonary/Chest: Effort normal.   Neurological: Patient is alert and oriented to person, place, and time.   Skin: Skin is warm and dry. Capillary refill takes less than 2 seconds.   Psychiatric: Patient has a normal mood and affect; behavior is normal.       Right Hand Exam     Comments:  Negative Tinel's over the carpal tunnel, negative compression test at the carpal tunnel, negative Tinel's over Guyon's canal, negative pronator exam, positive Tinel's over cubital tunnel, positive Durkan's/compression test over cubital tunnel, negative radial tunnel exam, no  tenderness over medial epicondyle, no tenderness over the lateral epicondyle, stable DRUJ, negative Watson's maneuver, negative Finkelstein's, negative CMC grind    Opposition intact, ulnar intrinsics functioning.                 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 There is of bleeding, infection, scarring, pain, nerve injury, need for the procedures were described. His questions were answered. We will schedule at his convenience.             This note was transcribed utilizing voice recognition software. While reasonable attempts have been made to identify and correct transcription errors, they may be present.

## 2018-07-02 NOTE — Unmapped (Addendum)
1) Please schedule an appointment to see your primary care physician for a pre-operative visit.  This appointment must take place within 30 days of surgery.  If you do not have a PCP or are unable to see your physician within this time frame please notify our office so other arrangements can be made.        2) We are scheduling you for surgery on 07/22/2018.  Our office will call you with the surgery time.  Please let us know if you have any questions in the meantime at [phone].                Pre-Procedure Instructions     We???re pleased that you have chosen [facility] for your upcoming procedure.  The staff serving you is professionally trained to provide the highest quality care.  We encourage you to ask questions and to let the staff know your special needs.  We want your visit to be as comfortable as possible.     Please make sure you arrive 2 hours before your surgery is scheduled to start.    Location: [facility]                   [address]                   2nd Floor Same Day Surgery                   [place] [address]    On the day of surgery please check in at ground floor registration desk.        ?? DO NOT EAT OR DRINK ANYTHING (including gum, mints, water, etc.) after midnight the night before your procedure.  You may brush your teeth and gargle on the morning of surgery, but do not swallow any water.  You may take the following medications with a small sip of water:    ?? Please make transportation arrangements and bring a responsible adult to accompany you home and remain with you for 24 hours.  ?? Leave valuables (money, jewelry, credit cards) at home.  If you wear glasses or contacts, bring a case for safekeeping.  ?? Wear casual, loose fitting, and comfortable clothing.  A gown will be provided.  If you are staying overnight, bring a small overnight bag.  (Storage space is limited.)  ?? Please remove all makeup, jewelry, body piercings, powder, perfume, and nail  polish before you arrive.  ?? Bring a list of your medications and dose including herbal and over-the-counter medications.  Do not bring any pills or medications to the hospital. (Exception: transplant patients.)  ?? Bring a Building services engineer ID and your insurance card so we can bill your insurance company directly.  ?? Please do not bring any children under the age of 62 to the hospital.  ?? Do not shave in the area of the surgery for 2 days prior to surgery.  If needed, a trained staff member will clip the area immediately before your surgery.  ?? Quit smoking as far in advance of surgery as possible.  Patients who quit at least 30 days before surgery may have better outcomes. UC is a non-smoking campus.  ?? If you are diabetic, pay close attention to your blood sugar and try to keep it in the range your doctor wants it to be in.  If you have low blood sugar prior to coming in to the hospital  you may drink a small amount of CLEAR SUGAR-CONTAINING FLUIDS WITHOUT PULP SUCH AS APPLE JUICE OR CLEAR SODA.  Depending on the procedure you are having this could impact the timing of your surgery.  ?? Discuss discontinuing herbal medications with your doctor before surgery.  ?? We are working diligently to protect our patients from postoperative surgical site infections. Please shower at home the evening before and the morning of surgery using an antiseptic agent, if provided, or antibacterial soap. If you were given Chlorhexidine (Hibiclens), please keep it off of your face or head, and keep away from your eyes.  ?? If you suspect that you have an infection prior to surgery, contact your doctor and surgeon prior to surgery.  Also tell the pre-surgery nurse on the day of surgery.     Additional instructions: Please follow any additional surgeon's instructions.

## 2018-07-12 NOTE — Unmapped (Signed)
Patient is confirmed for surgery with Dr. Jen Mow on 07/22/18 at [facility] for 7:30am with an arrival time of 5:30am.  NPO after midnight the night before surgery and will need someone with him the day of surgery and to drive home from facility.     I spoke with patient's daughter and asked they call the office with any questions.

## 2018-07-15 ENCOUNTER — Emergency Department: Admit: 2018-07-15 | Payer: MEDICAID | Primary: Adult Health

## 2018-07-15 ENCOUNTER — Inpatient Hospital Stay
Admit: 2018-07-15 | Discharge: 2018-07-15 | Disposition: A | Discharge status: Home / Self Care | Payer: MEDICAID | Attending: Emergency Medicine

## 2018-07-15 DIAGNOSIS — R51 Headache: Secondary | ICD-10-CM

## 2018-07-15 LAB — CBC WITH AUTO DIFFERENTIAL
Basophils %: 0.9 %
Basophils Absolute: 0.1 10*3/uL (ref 0.0–0.2)
Eosinophils %: 1.5 %
Eosinophils Absolute: 0.1 10*3/uL (ref 0.0–0.6)
Hematocrit: 43.6 % (ref 40.5–52.5)
Hemoglobin: 14.8 g/dL (ref 13.5–17.5)
Lymphocytes %: 23 %
Lymphocytes Absolute: 1.5 10*3/uL (ref 1.0–5.1)
MCH: 30.4 pg (ref 26.0–34.0)
MCHC: 34 g/dL (ref 31.0–36.0)
MCV: 89.3 fL (ref 80.0–100.0)
MPV: 8.4 fL (ref 5.0–10.5)
Monocytes %: 7.4 %
Monocytes Absolute: 0.5 10*3/uL (ref 0.0–1.3)
Neutrophils %: 67.2 %
Neutrophils Absolute: 4.3 10*3/uL (ref 1.7–7.7)
Platelets: 227 10*3/uL (ref 135–450)
RBC: 4.88 M/uL (ref 4.20–5.90)
RDW: 13.4 % (ref 12.4–15.4)
WBC: 6.4 10*3/uL (ref 4.0–11.0)

## 2018-07-15 LAB — COMPREHENSIVE METABOLIC PANEL W/ REFLEX TO MG FOR LOW K
ALT: 14 U/L (ref 10–40)
AST: 18 U/L (ref 15–37)
Albumin/Globulin Ratio: 1.5 (ref 1.1–2.2)
Albumin: 4.1 g/dL (ref 3.4–5.0)
Alkaline Phosphatase: 81 U/L (ref 40–129)
Anion Gap: 15 (ref 3–16)
BUN: 12 mg/dL (ref 7–20)
CO2: 21 mmol/L (ref 21–32)
Calcium: 9 mg/dL (ref 8.3–10.6)
Chloride: 104 mmol/L (ref 99–110)
Creatinine: 0.9 mg/dL (ref 0.8–1.3)
GFR African American: >60 (ref >60)
GFR Non-African American: >60 (ref >60)
Globulin: 2.7 g/dL
Glucose: 98 mg/dL (ref 70–99)
Potassium reflex Magnesium: 3.8 mmol/L (ref 3.5–5.1)
Sodium: 140 mmol/L (ref 136–145)
Total Bilirubin: 0.4 mg/dL (ref 0.0–1.0)
Total Protein: 6.8 g/dL (ref 6.4–8.2)

## 2018-07-15 LAB — TROPONIN
Troponin: <0.01 ng/mL (ref <0.01)
Troponin: <0.01 ng/mL (ref <0.01)

## 2018-07-15 LAB — LIPASE: Lipase: 21 U/L (ref 13.0–60.0)

## 2018-07-15 LAB — RAPID INFLUENZA A/B ANTIGENS
Rapid Influenza A Ag: NEGATIVE
Rapid Influenza B Ag: NEGATIVE

## 2018-07-15 LAB — BRAIN NATRIURETIC PEPTIDE: Pro-BNP: 26 pg/mL (ref 0–124)

## 2018-07-15 MED ORDER — ONDANSETRON 4 MG PO TBDP
4 MG | ORAL_TABLET | Freq: Three times a day (TID) | ORAL | 0 refills | Status: DC | PRN
Start: 2018-07-15 — End: 2020-12-30

## 2018-07-15 MED ORDER — ONDANSETRON HCL 4 MG/2ML IJ SOLN
4 MG/2ML | Freq: Once | INTRAMUSCULAR | Status: AC
Start: 2018-07-15 — End: 2018-07-15
  Administered 2018-07-15: 19:00:00 4 mg via INTRAVENOUS

## 2018-07-15 MED ORDER — SODIUM CHLORIDE 0.9 % IV BOLUS
0.9 | Freq: Once | INTRAVENOUS | Status: AC
Start: 2018-07-15 — End: 2018-07-15
  Administered 2018-07-15: 19:00:00 1000 mL via INTRAVENOUS

## 2018-07-15 MED ORDER — KETOROLAC TROMETHAMINE 30 MG/ML IJ SOLN
30 MG/ML | Freq: Once | INTRAMUSCULAR | Status: AC
Start: 2018-07-15 — End: 2018-07-15
  Administered 2018-07-15: 18:00:00 15 mg via INTRAVENOUS

## 2018-07-15 MED FILL — KETOROLAC TROMETHAMINE 30 MG/ML IJ SOLN: 30 mg/mL | INTRAMUSCULAR | Qty: 1

## 2018-07-15 MED FILL — ONDANSETRON HCL 4 MG/2ML IJ SOLN: 4 MG/2ML | INTRAMUSCULAR | Qty: 2

## 2018-07-15 NOTE — ED Provider Notes (Signed)
[facility] EMERGENCY DEPARTMENT  EMERGENCY DEPARTMENT ENCOUNTER        Pt Name: Michael Knight  MRN: [id number]  Birthdate 04-29-1956  Date of evaluation: 07/15/2018  Provider: Isa Rankin, APRN - CNP  PCP: Ulice Bold, APRN - CNP    This patient was seen and evaluated by the attending physician Dr. Shawn Stall      CHIEF COMPLAINT       Chief Complaint   Patient presents with   ??? Headache     headache for awhile and has medication took it earlier, patient reporting that it got worse.        HISTORY OF PRESENT ILLNESS   (Location/Symptom, Timing/Onset, Context/Setting, Quality, Duration, Modifying Factors, Severity)  Note limiting factors.     Michael Knight is a 63 y.o. male with medical history of asthma, dyslipidemia, GERD, hypothyroidism who presents the ED with complaints of frontal headache since yesterday with nausea, vomiting, ear pain, and dizzy.  Said he started to get a slight headache yesterday, as he said he gets headaches daily.  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  [employer] he goes Monday through Friday and this is a normal routine for him.  Said he ate a biscuit for breakfast and then had one episode of vomiting.  He denies taking any medication for this headache.  He does take prescription medications daily, however he frequently misses his morning medications as he forgets.  He does have some mild associated photophobia, although denies any loss of vision.  He also has some mild sinus congestion, although this seems at his baseline.  Patient lives at home alone and is able to take care of himself.  Denies any smoking, alcohol use, or street drugs.    HPI is limited as patient does not speak English and translation is provided by his daughter at bedside.    Nursing Notes were all reviewed and agreed with or any disagreements were addressed in the HPI.    REVIEW OF SYSTEMS    (2-9 systems for level 4, 10 or more for level 5)      Review of Systems    Positives and Pertinent negatives as per HPI.  Except as noted above in the ROS, all other systems were reviewed and negative.       PAST MEDICAL HISTORY     Past Medical History:   Diagnosis Date   ??? Asthma    ??? Back pain    ??? Dyslipidemia    ??? GERD (gastroesophageal reflux disease)    ??? Hypothyroidism          SURGICAL HISTORY     Past Surgical History:   Procedure Laterality Date   ??? UPPER GASTROINTESTINAL ENDOSCOPY N/A 02/08/2018    EGD BIOPSY performed by Kandice Robinsons, MD at [facility] ENDOSCOPY         CURRENTMEDICATIONS       Discharge Medication List as of 07/15/2018  5:56 PM      CONTINUE these medications which have NOT CHANGED    Details   ondansetron (ZOFRAN) 4 MG tablet Take 1 tablet by mouth every 8 hours as needed for Nausea, Disp-20 tablet, R-0Print      famotidine (PEPCID) 20 MG tablet Take 1 tablet by mouth 2 times daily, Disp-60 tablet, R-0Print      hydrOXYzine (VISTARIL) 25 MG capsule Take 1 capsule by mouth 3 times daily as  needed (anxiety and congestion), Disp-20 capsule, R-0Print      butalbital-APAP-caffeine (FIORICET) 50-300-40 MG CAPS per capsule Take 1 capsule by mouth every 6 hours as needed for Headaches, Disp-20 capsule, R-0Print      pantoprazole (PROTONIX) 40 MG tablet Take 1 tablet by mouth 2 times daily (before meals), Disp-60 tablet, R-2Print      albuterol sulfate HFA 108 (90 Base) MCG/ACT inhaler Inhale 2 puffs into the lungs every 4 hours as needed Historical Med      levothyroxine (SYNTHROID) 50 MCG tablet Take 50 mcg by mouth DailyHistorical Med      pravastatin (PRAVACHOL) 40 MG tablet Take 40 mg by mouth nightly Historical Med               ALLERGIES     Patient has no known allergies.    FAMILYHISTORY     History reviewed. No pertinent family history.       SOCIAL HISTORY       Social History     Tobacco Use   ??? Smoking status: Former Smoker   ??? Smokeless tobacco: Never Used   Substance Use Topics   ??? Alcohol use: Not Currently   ??? Drug use: Never        SCREENINGS      Heart Score for chest pain patients  History: Slightly Suspicious  ECG: Normal  Patient Age: > 45 and < 65 years  *Risk factors for Atherosclerotic disease: Hypercholesterolemia  Risk Factors: 1 or 2 risk factors  Troponin: < 1X normal limit  Heart Score Total: 2      PHYSICAL EXAM    (up to 7 for level 4, 8 or more for level 5)     ED Triage Vitals [07/15/18 1146]   BP Temp Temp src Pulse Resp SpO2 Height Weight   (!) 132/94 99.2 ??F (37.3 ??C) -- 86 18 100 % -- --       Physical Exam  Vitals signs and nursing note reviewed.   Constitutional:       General: He is awake.      Appearance: Normal appearance. He is well-developed and normal weight.   HENT:      Head: Normocephalic and atraumatic.      Nose: No mucosal edema or rhinorrhea.      Right Sinus: Frontal sinus tenderness present. No maxillary sinus tenderness.      Left Sinus: Frontal sinus tenderness present. No maxillary sinus tenderness.      Mouth/Throat:      Mouth: Mucous membranes are moist.      Pharynx: Oropharynx is clear.   Eyes:      General:         Right eye: No discharge.         Left eye: No discharge.      Extraocular Movements: Extraocular movements intact.      Pupils: Pupils are equal, round, and reactive to light.   Neck:      Musculoskeletal: Normal range of motion.   Pulmonary:      Effort: Pulmonary effort is normal. No respiratory distress.      Breath sounds: Normal breath sounds.   Chest:       Abdominal:      General: Bowel sounds are normal.      Palpations: Abdomen is soft.      Tenderness: There is no abdominal tenderness.   Musculoskeletal: Normal range of motion.   Skin:     General: Skin  is warm and dry.      Coloration: Skin is not pale.   Neurological:      General: No focal deficit present.      Mental Status: He is alert and oriented to person, place, and time.      Cranial Nerves: Cranial nerves are intact.      Sensory: Sensation is intact.      Motor: Motor function is intact.   Psychiatric:          Behavior: Behavior normal. Behavior is cooperative.         DIAGNOSTIC RESULTS   LABS:    Labs Reviewed   RAPID INFLUENZA A/B ANTIGENS    Narrative:     Performed at:  [facility]  [address],  [place], [place] [address]   Phone [phone]   CBC WITH AUTO DIFFERENTIAL    Narrative:     Performed at:  [facility]  [address]  [place], [place] [address]   Phone [phone]   COMPREHENSIVE METABOLIC PANEL W/ REFLEX TO MG FOR LOW K    Narrative:     Performed at:  [facility]  [address]  [place], [place] [address]   Phone [phone]   LIPASE    Narrative:     Performed at:  [facility]  [address]  [place], [place] [address]   Phone [phone]   TROPONIN    Narrative:     Performed at:  [facility]  [address]  [place], [place] [address]   Phone [phone]   BRAIN NATRIURETIC PEPTIDE    Narrative:     Performed at:  [facility]  [address]  [place], [place] [address]   Phone [phone]   URINE RT REFLEX TO CULTURE       All other labs were within normal range or not returned as of this dictation.    EKG: All EKG's are interpreted by the Emergency Department Physician in the absence of a cardiologist.  Please see their note for interpretation of EKG.      RADIOLOGY:   Non-plain film images such as CT, Ultrasound and MRI are read by the radiologist. Plain radiographic images are visualized and preliminarily interpreted by the  ED Provider with the below findings:        Interpretation per the Radiologist below, if available at the time of this note:    XR CHEST STANDARD (2 VW)   Final Result   No evidence of acute cardiopulmonary disease.         CT Head WO Contrast   Final Result   No acute intracranial abnormality.           Xr Chest Standard (2 Vw)    Result Date: 07/15/2018  EXAMINATION: TWO XRAY VIEWS OF THE CHEST  07/15/2018 1:31 pm COMPARISON: June 23, 2018 HISTORY: ORDERING SYSTEM PROVIDED HISTORY: epigastric pain TECHNOLOGIST PROVIDED HISTORY: Reason for exam:->epigastric pain Reason for Exam: Headache (headache for awhile and has medication took it earlier, patient reporting that it got worse. ) Acuity: Unknown Type of Exam: Unknown FINDINGS: No evidence of pneumonia, edema or other acute pulmonary process. No evidence of acute process of the cardiac or mediastinal structures. No evidence of pneumothorax or pleural effusion.     No evidence of acute cardiopulmonary disease.  Ct Head Wo Contrast    Result Date: 07/15/2018  EXAMINATION: CT OF THE HEAD WITHOUT CONTRAST  07/15/2018 1:30 pm TECHNIQUE: CT of the head was performed without the administration of intravenous contrast. Dose modulation, iterative reconstruction, and/or weight based adjustment of the mA/kV was utilized to reduce the radiation dose to as low as reasonably achievable. COMPARISON: None. HISTORY: ORDERING SYSTEM PROVIDED HISTORY: h/a, nausea, photophobia TECHNOLOGIST PROVIDED HISTORY: Reason for exam:->h/a, nausea, photophobia Has a "code stroke" or "stroke alert" been called?->No Reason for Exam: h/a, nausea, photophobia Acuity: Unknown Type of Exam: Unknown FINDINGS: BRAIN/VENTRICLES: There is no acute intracranial hemorrhage, mass effect or midline shift.  No abnormal extra-axial fluid collection.  The gray-white differentiation is maintained without evidence of an acute infarct.  There is no evidence of hydrocephalus. ORBITS: The visualized portion of the orbits demonstrate no acute abnormality. SINUSES: The visualized paranasal sinuses and mastoid air cells demonstrate no acute abnormality. SOFT TISSUES/SKULL:  No acute abnormality of the visualized skull or soft tissues.     No acute intracranial abnormality.           PROCEDURES   Unless otherwise noted below, none     Procedures    CRITICAL CARE TIME   N/A    CONSULTS:  None      EMERGENCY  DEPARTMENT COURSE and DIFFERENTIAL DIAGNOSIS/MDM:   Vitals:    Vitals:    07/15/18 1146 07/15/18 1745   BP: (!) 132/94 120/82   Pulse: 86 71   Resp: 18 16   Temp: 99.2 ??F (37.3 ??C) 98.2 ??F (36.8 ??C)   SpO2: 100% 100%       Patient was given thefollowing medications:  Medications   0.9 % sodium chloride bolus (0 mLs Intravenous Stopped 07/15/18 1432)   ketorolac (TORADOL) injection 15 mg (15 mg Intravenous Given 07/15/18 1322)   ondansetron (ZOFRAN) injection 4 mg (4 mg Intravenous Given 07/15/18 1332)       Pertinent Labs & Imaging studies reviewed. (See chart for details)   -  Patient seen and evaluated in the emergency department.  -  Triage and nursing notes reviewed and incorporated.  -  Old chart records reviewed and incorporated.  -  Patient case discussed with attending physician, Dr. Shawn Stallharlene Miller.  They saw and examined patient.  -  Differential diagnosis includes:  Migraine, influenza, dehydration, pneumonia, versus gastritis  -  Work-up included:  See above CT head without, CXR, CBC, CMP, lipase, troponin, BNP, UA, rapid flu  -  ED treatment included:   Normal saline, Toradol, Zofran  - Consults: None  -  Results discussed with patient.  Labs show  CBC is negative.  CXR shows no evidence of acute cardiopulmonary disease.  CT head shows no acute intracranial abnormalities.. Pt was given strict return precautions. The patient is agreeable with plan of care and disposition.  -  Disposition:   Home    Patient is pending repeat troponin as Dr. Shawn Stallharlene Miller will follow up with this result.    FINAL IMPRESSION      1. Acute nonintractable headache, unspecified headache type    2. Nausea and vomiting, intractability of vomiting not specified, unspecified vomiting type          DISPOSITION/PLAN   DISPOSITION        PATIENT REFERREDTO:  Ulice Boldhristine L Colella, APRN - CNP  [address]  [place] [id number]  [phone]    Schedule an appointment as soon as possible for a visit  [facility]  Emergency Department  [address]  [place] [address]  [phone]  Go to   If symptoms worsen      DISCHARGE MEDICATIONS:  Discharge Medication List as of 07/15/2018  5:56 PM      START taking these medications    Details   ondansetron (ZOFRAN ODT) 4 MG disintegrating tablet Take 1-2 tablets by mouth every 8 hours as needed for Nausea May substitute the non ODT tablets if not covered financially by the insurance plan., Disp-12 tablet, R-0Print             DISCONTINUED MEDICATIONS:  Discharge Medication List as of 07/15/2018  5:56 PM                 (Please note that portions of this note were completed with a voice recognition program.  Efforts were made to edit the dictations but occasionally words are mis-transcribed.)    Isa Rankin, APRN - CNP (electronically signed)            Isa Rankin, APRN - CNP  07/16/18 1537

## 2018-07-15 NOTE — ED Notes (Signed)
Vital signs updated, Reviewed discharge instructions with patient, verbalized understanding, ambulatory at time of discharge.        Lenoria Chime, RN  07/15/18 1800

## 2018-07-15 NOTE — ED Provider Notes (Signed)
[facility] Emergency Department      Pt Name: Michael Knight  MRN: [id number]  Birthdate 1955-12-02  Date of evaluation: 07/15/2018  Provider: Sidonie Dickens, MD  I independently performed a history and physical on Michael Knight.   All diagnostic, treatment, and disposition decisions were made by myself in conjunction with the advanced practice provider.     HPI: Michael Knight presented with   Chief Complaint   Patient presents with   ??? Headache     headache for awhile and has medication took it earlier, patient reporting that it got worse.      Michael Knight has a past medical history of Asthma, Back pain, Dyslipidemia, GERD (gastroesophageal reflux disease), and Hypothyroidism.  He has a past surgical history that includes Upper gastrointestinal endoscopy (N/A, 02/08/2018).    No current facility-administered medications on file prior to encounter.      Current Outpatient Medications on File Prior to Encounter   Medication Sig Dispense Refill   ??? ondansetron (ZOFRAN) 4 MG tablet Take 1 tablet by mouth every 8 hours as needed for Nausea 20 tablet 0   ??? famotidine (PEPCID) 20 MG tablet Take 1 tablet by mouth 2 times daily 60 tablet 0   ??? hydrOXYzine (VISTARIL) 25 MG capsule Take 1 capsule by mouth 3 times daily as needed (anxiety and congestion) 20 capsule 0   ??? butalbital-APAP-caffeine (FIORICET) 50-300-40 MG CAPS per capsule Take 1 capsule by mouth every 6 hours as needed for Headaches 20 capsule 0   ??? pantoprazole (PROTONIX) 40 MG tablet Take 1 tablet by mouth 2 times daily (before meals) 60 tablet 2   ??? albuterol sulfate HFA 108 (90 Base) MCG/ACT inhaler Inhale 2 puffs into the lungs every 4 hours as needed      ??? levothyroxine (SYNTHROID) 50 MCG tablet Take 50 mcg by mouth Daily     ??? pravastatin (PRAVACHOL) 40 MG tablet Take 40 mg by mouth nightly        PHYSICAL EXAM  Vitals: BP (!) 132/94    Pulse 86    Temp 99.2 ??F (37.3 ??C)    Resp 18    SpO2 100%   Constitutional:  63 y.o. male alert  HENT:  Atraumatic, oral mucosa moist  Neck:   No visible JVD, supple  Chest/Lungs:  Respiratory effort normal, clear, regular  Abdomen:  Non-distended, soft, NT  Back:  No gross deformity  Extremities:  Normal tone and perfusion  Neurologic:  Alert, speech normal, mentation normal, pupils equal, normal coordination of extremities, no facial asymmetry     Medical Decision Making and Plan: Briefly, this is an 63 y.o.male who presented with concern for headache, vomiting, epigastric discomfort, slightly worse than his normal headaches since about 10 am.  Low grade temperature.  His headache has resolved after treatment and he feels much better.  He has had no further emesis.  Epigastric discomfort is improved as well.  We do not feel the headache symptoms reflect a more emergent condition such as meningitis, encephalitis, ICH, sentinel bleed from ruptured aneurysm, thrombosis, TIA, pseudotumor cerebri, temporal arteritis, hypertensive urgency or emergency, acute angle glaucoma, tumor with mass effect, sinus abscess, mastoiditis, shingles, trigeminal neuralgia, CO poisoning, etc.  Differential diagnoses also included acute coronary syndrome, aortic dissection, pulmonary embolism, tension pneumothorax, myocarditis, pericardial tamponade, Boerhaave syndrome, bowel or biliary obstruction, perforated viscous, appendicitis, gonad torsion, vascular occlusion, etc but clinically we doubt one of the more emergent etiologies for his symptoms.  Based on clinical  presentation and diagnostics, the patient likely has a viral illness.  We discussed symptomatic treatment, fluids, and rest.  We advised the patient to return to the ER if he does not improve as anticipated over the next week, develops difficulty breathing, weakness, inability to take liquids, or has other concerns.  Daughter assisted with translation by his preference.  Michael Knight was given appropriate discharge instructions.  Referral to follow up provider.     For further details of Michael Knight's Emergency Department  encounter, please see documentation by advanced practice provider Windell Norfolkanielle Meriwether, CNP    Labs Reviewed   RAPID INFLUENZA A/B ANTIGENS    Narrative:     Performed at:  [facility]  [address],  [place], [place] [address]   Phone [phone]   CBC WITH AUTO DIFFERENTIAL    Narrative:     Performed at:  [facility]  [address],  [place], [place] [address]   Phone [phone]   COMPREHENSIVE METABOLIC PANEL W/ REFLEX TO MG FOR LOW K    Narrative:     Performed at:  [facility] [facility]  [address],  [place], [place] [id number]   Phone [phone]   LIPASE    Narrative:     Performed at:  [facility]  [address],  [place], [place] [id number]   Phone [phone]   TROPONIN    Narrative:     Performed at:  [facility]  [address],  [place], [place] [address]   Phone [phone]   BRAIN NATRIURETIC PEPTIDE    Narrative:     Performed at:  [facility] [facility]  [address],  [place], [place] [id number]   Phone [phone]   TROPONIN    Narrative:     Performed at:  [facility] [facility]  [address],  [place], [place] [id number]   Phone [phone]   URINE RT REFLEX TO CULTURE     RADIOLOGY:     Plain x-rays were viewed by me:   XR CHEST STANDARD (2 VW)   Final Result   No evidence of acute cardiopulmonary disease.         CT Head WO Contrast   Final Result   No acute intracranial abnormality.           EKG:  Read by me in the absence of a cardiologist shows: Sinus rhythm, rate 73, RSR prime pattern in V1 and V2, other intervals normal, axis normal, no acute injury pattern, no major change when compared to 23 June 2018     Medications administered:  Medications   0.9 % sodium chloride bolus (0 mLs Intravenous Stopped 07/15/18 1432)   ketorolac (TORADOL) injection 15 mg (15 mg Intravenous Given 07/15/18 1322)   ondansetron  (ZOFRAN) injection 4 mg (4 mg Intravenous Given 07/15/18 1332)     Vitals:    07/15/18 1146 07/15/18 1745   BP: (!) 132/94 120/82   Pulse: 86 71   Resp: 18 16   Temp: 99.2 ??F (37.3 ??C) 98.2 ??F (36.8 ??C)   SpO2: 100% 100%     Discharge Medication List as of 07/15/2018  5:56 PM      START taking these medications    Details   ondansetron (ZOFRAN ODT) 4 MG disintegrating tablet Take 1-2 tablets by mouth every 8 hours as  needed for Nausea May substitute the non ODT tablets if not covered financially by the insurance plan., Disp-12 tablet, R-0Print           FOLLOW UP:    Ulice Bold, APRN - CNP  [address]  [place] [address]  [phone]    Schedule an appointment as soon as possible for a visit       [facility] Emergency Department  [address]  [place] [address]  [phone]  Go to   If symptoms worsen    FINAL IMPRESSION:    1. Acute nonintractable headache, unspecified headache type    2. Nausea and vomiting, intractability of vomiting not specified, unspecified vomiting type       DISPOSITION Decision To Discharge 07/15/2018 05:42:28 PM       Riesa Pope, MD  07/15/18 1810

## 2018-07-16 LAB — EKG 12-LEAD
Atrial Rate: 73 {beats}/min
P Axis: 38 degrees
P-R Interval: 170 ms
Q-T Interval: 414 ms
QRS Duration: 88 ms
QTc Calculation (Bazett): 456 ms
R Axis: 0 degrees
T Axis: 2 degrees
Ventricular Rate: 73 {beats}/min

## 2018-07-18 NOTE — Unmapped (Signed)
07/03/18 0001   Pre-op Phone Call   Surgery Time Verified   (07/22/18 (date only))   Arrival Time Verified   (office to confirm)   Surgery Location Verified Yes   Remind patient to bring picture ID and insurance card Yes   Medical History Reviewed No   NPO Status Reinforced Yes   Ride and Caregiver Arranged Yes   Ride Caregiver Provider son in law Uttem   Instructions to bring current medication list Yes  (family didn't know his medications)   I gave Pre op instructions to the patients son in law Uttem. NPO after midnight except he can take famotidine, levothyroxine, pantoprazole; vistaril if needed am of OR with a sip of water.  Avoid the use of Aspirin, ibuprofen & Aleve (NSAIDS), Vitamin E & fish oil. Tylenol is ok. Bring inhaler. He denied having any questions.

## 2018-07-19 MED FILL — CEFAZOLIN 2 GRAM/100 ML IN DEXTROSE(ISO-OSMOTIC) INTRAVENOUS PIGGYBACK: 2 2 gram/100 mL | INTRAVENOUS | Qty: 100

## 2018-07-22 MED ORDER — oxyCODONE (ROXICODONE) immediate release tablet 5 mg
5 | ORAL | Status: AC | PRN
Start: 2018-07-22 — End: 2018-07-22

## 2018-07-22 MED ORDER — HYDROcodone-acetaminophen (NORCO) 5-325 mg per tablet
5-325 | ORAL_TABLET | Freq: Four times a day (QID) | ORAL | 0 refills | 15.50000 days | Status: AC | PRN
Start: 2018-07-22 — End: 2018-07-29
  Filled 2018-07-22: qty 30, 5d supply, fill #0

## 2018-07-22 MED ORDER — lactated Ringers infusion
INTRAVENOUS | Status: AC | PRN
Start: 2018-07-22 — End: 2018-07-22
  Administered 2018-07-22: 12:00:00 via INTRAVENOUS

## 2018-07-22 MED ORDER — dextrose 10 % (D10W) in water infusion
10 | INTRAVENOUS | Status: AC | PRN
Start: 2018-07-22 — End: 2018-07-22

## 2018-07-22 MED ORDER — dexamethasone (DECADRON) injection
4 | INTRAMUSCULAR | Status: AC | PRN
Start: 2018-07-22 — End: 2018-07-22
  Administered 2018-07-22: 13:00:00 4 via INTRAVENOUS

## 2018-07-22 MED ORDER — propofol (DIPRIVAN) infusion 10 mg/mL
10 | INTRAVENOUS | Status: AC | PRN
Start: 2018-07-22 — End: 2018-07-22
  Administered 2018-07-22: 13:00:00 100 via INTRAVENOUS

## 2018-07-22 MED ORDER — ondansetron (ZOFRAN) 4 mg/2 mL injection
4 | INTRAMUSCULAR | Status: AC
Start: 2018-07-22 — End: ?

## 2018-07-22 MED ORDER — fentaNYL (SUBLIMAZE) injection 12.5 mcg
50 | INTRAMUSCULAR | Status: AC | PRN
Start: 2018-07-22 — End: 2018-07-22

## 2018-07-22 MED ORDER — glycopyrrolate (ROBINUL) 0.2 mg/mL injection
0.2 | INTRAMUSCULAR | Status: AC
Start: 2018-07-22 — End: ?

## 2018-07-22 MED ORDER — celecoxib (CELEBREX) 200 MG capsule
200 | ORAL | Status: AC
Start: 2018-07-22 — End: 2018-07-22

## 2018-07-22 MED ORDER — acetaminophen (TYLENOL) 325 MG tablet
325 | ORAL | Status: AC
Start: 2018-07-22 — End: 2018-07-22

## 2018-07-22 MED ORDER — propofol 10 mg/ml (DIPRIVAN) 10 mg/mL injection
10 | INTRAVENOUS | Status: AC
Start: 2018-07-22 — End: ?

## 2018-07-22 MED ORDER — gabapentin (NEURONTIN) 300 MG capsule
300 | ORAL | Status: AC
Start: 2018-07-22 — End: 2018-07-22

## 2018-07-22 MED ORDER — gabapentin (NEURONTIN) capsule 600 mg
300 | ORAL | Status: AC | PRN
Start: 2018-07-22 — End: 2018-07-22
  Administered 2018-07-22: 12:00:00 600 mg via ORAL

## 2018-07-22 MED ORDER — lidocaine (PF) 2% (20 mg/mL) 20 mg/mL (2 %)
20 | INTRAMUSCULAR | Status: AC
Start: 2018-07-22 — End: ?

## 2018-07-22 MED ORDER — propofol (DIPRIVAN) infusion 10 mg/mL ADS Med
10 | INTRAVENOUS | Status: AC
Start: 2018-07-22 — End: ?

## 2018-07-22 MED ORDER — cefazolin (ANCEF) 2 g in dextrose (iso-osmotic) 100 mL IVPB
2 | INTRAVENOUS | Status: AC | PRN
Start: 2018-07-22 — End: 2018-07-22
  Administered 2018-07-22: 13:00:00 2 g via INTRAVENOUS

## 2018-07-22 MED ORDER — ondansetron (ZOFRAN) injection 4 mg
4 | Freq: Three times a day (TID) | INTRAMUSCULAR | Status: AC | PRN
Start: 2018-07-22 — End: 2018-07-22

## 2018-07-22 MED ORDER — rocuronium (ZEMURON) 10 mg/mL injection
10 | INTRAVENOUS | Status: AC
Start: 2018-07-22 — End: ?

## 2018-07-22 MED ORDER — oxyCODONE (ROXICODONE) immediate release tablet 2.5 mg
5 | ORAL | Status: AC | PRN
Start: 2018-07-22 — End: 2018-07-22

## 2018-07-22 MED ORDER — HYDROmorphone (DILAUDID) injection 0.2 mg
1 | INTRAMUSCULAR | Status: AC | PRN
Start: 2018-07-22 — End: 2018-07-22

## 2018-07-22 MED ORDER — ropivacaine (PF) (NAROPIN) injection solution 0.5%
5 | INTRAMUSCULAR | Status: AC
Start: 2018-07-22 — End: 2018-07-22
  Administered 2018-07-22: 12:00:00 30

## 2018-07-22 MED ORDER — glucose chewable tablet 12 g
4 | ORAL | Status: AC | PRN
Start: 2018-07-22 — End: 2018-07-22

## 2018-07-22 MED ORDER — propofol 10 mg/ml (DIPRIVAN) injection
10 | INTRAVENOUS | Status: AC | PRN
Start: 2018-07-22 — End: 2018-07-22
  Administered 2018-07-22 (×2): 20 via INTRAVENOUS
  Administered 2018-07-22 (×2): 10 via INTRAVENOUS
  Administered 2018-07-22: 13:00:00 140 via INTRAVENOUS

## 2018-07-22 MED ORDER — dexamethasone (DECADRON) 4 mg/mL injection
4 | INTRAMUSCULAR | Status: AC
Start: 2018-07-22 — End: ?

## 2018-07-22 MED ORDER — HYDROmorphone (DILAUDID) injection 0.5 mg
1 | INTRAMUSCULAR | Status: AC | PRN
Start: 2018-07-22 — End: 2018-07-22

## 2018-07-22 MED ORDER — proMETHazine (PHENERGAN) injection 6.25 mg
25 | Freq: Four times a day (QID) | INTRAMUSCULAR | Status: AC | PRN
Start: 2018-07-22 — End: 2018-07-22

## 2018-07-22 MED ORDER — acetaminophen (TYLENOL) tablet 975 mg
325 | ORAL | Status: AC | PRN
Start: 2018-07-22 — End: 2018-07-22
  Administered 2018-07-22: 12:00:00 975 mg via ORAL

## 2018-07-22 MED ORDER — succinylcholine (QUELICIN) 20 mg/mL injection
20 | INTRAMUSCULAR | Status: AC
Start: 2018-07-22 — End: ?

## 2018-07-22 MED ORDER — neostigmine methylsulfate (PROSTIGMIN) 1 mg/mL IV solution
1 | INTRAVENOUS | Status: AC
Start: 2018-07-22 — End: ?

## 2018-07-22 MED ORDER — celecoxib (CELEBREX) capsule 400 mg
200 | ORAL | Status: AC | PRN
Start: 2018-07-22 — End: 2018-07-22
  Administered 2018-07-22: 12:00:00 400 mg via ORAL

## 2018-07-22 MED ORDER — dexamethasone (DECADRON) injection 4 mg
4 | Freq: Once | INTRAMUSCULAR | Status: AC | PRN
Start: 2018-07-22 — End: 2018-07-22

## 2018-07-22 MED ORDER — sodium chloride, irrigation 0.9 % irrigation
0.9 | Status: AC | PRN
Start: 2018-07-22 — End: 2018-07-22
  Administered 2018-07-22: 13:00:00 500

## 2018-07-22 MED ORDER — naloxone (NARCAN) injection 0.04 mg
0.4 | INTRAMUSCULAR | Status: AC | PRN
Start: 2018-07-22 — End: 2018-07-22

## 2018-07-22 MED ORDER — lidocaine (PF) 2% (20 mg/mL) 20 mg
20 | Freq: Once | INTRAMUSCULAR | Status: AC | PRN
Start: 2018-07-22 — End: 2018-07-22

## 2018-07-22 MED ORDER — fentaNYL (SUBLIMAZE) injection 25 mcg
50 | INTRAMUSCULAR | Status: AC | PRN
Start: 2018-07-22 — End: 2018-07-22

## 2018-07-22 MED ORDER — peppermint oil liquid 1 mL
Status: AC | PRN
Start: 2018-07-22 — End: 2018-07-22

## 2018-07-22 MED ORDER — ondansetron (ZOFRAN) injection
4 | INTRAMUSCULAR | Status: AC | PRN
Start: 2018-07-22 — End: 2018-07-22
  Administered 2018-07-22: 13:00:00 4 via INTRAVENOUS

## 2018-07-22 MED ORDER — lactated Ringers infusion
INTRAVENOUS | Status: AC
Start: 2018-07-22 — End: 2018-07-22
  Administered 2018-07-22: 13:00:00 via INTRAVENOUS

## 2018-07-22 MED FILL — GLYCOPYRROLATE 0.2 MG/ML INJECTION SOLUTION: 0.2 0.2 mg/mL | INTRAMUSCULAR | Qty: 2

## 2018-07-22 MED FILL — LACTATED RINGERS INTRAVENOUS SOLUTION: INTRAVENOUS | Qty: 1000

## 2018-07-22 MED FILL — CELEBREX 200 MG CAPSULE: 200 200 mg | ORAL | Qty: 2

## 2018-07-22 MED FILL — QUELICIN 20 MG/ML INJECTION SOLUTION: 20 20 mg/mL | INTRAMUSCULAR | Qty: 10

## 2018-07-22 MED FILL — DIPRIVAN 10 MG/ML INTRAVENOUS EMULSION: 10 10 mg/mL | INTRAVENOUS | Qty: 100

## 2018-07-22 MED FILL — XYLOCAINE-MPF 20 MG/ML (2 %) INJECTION SOLUTION: 20 20 mg/mL (2 %) | INTRAMUSCULAR | Qty: 5

## 2018-07-22 MED FILL — DEXAMETHASONE SODIUM PHOSPHATE 4 MG/ML INJECTION SOLUTION: 4 4 mg/mL | INTRAMUSCULAR | Qty: 1

## 2018-07-22 MED FILL — GABAPENTIN 300 MG CAPSULE: 300 300 MG | ORAL | Qty: 2

## 2018-07-22 MED FILL — PROPOFOL 10 MG/ML INTRAVENOUS EMULSION: 10 10 mg/mL | INTRAVENOUS | Qty: 20

## 2018-07-22 MED FILL — ONDANSETRON HCL (PF) 4 MG/2 ML INJECTION SOLUTION: 4 4 mg/2 mL | INTRAMUSCULAR | Qty: 2

## 2018-07-22 MED FILL — TYLENOL 325 MG TABLET: 325 325 mg | ORAL | Qty: 3

## 2018-07-22 MED FILL — ROCURONIUM 10 MG/ML INTRAVENOUS SOLUTION: 10 10 mg/mL | INTRAVENOUS | Qty: 5

## 2018-07-22 MED FILL — NEOSTIGMINE METHYLSULFATE 1 MG/ML INTRAVENOUS SOLUTION: 1 1 mg/mL | INTRAVENOUS | Qty: 10

## 2018-07-22 NOTE — Unmapped (Signed)
Anesthesia Post Note    Patient: Shawn Mann    Procedure(s) Performed: Procedure(s):  Right Ulnar Nerve Transposition at Elbow    Anesthesia type: general    Patient location: PACU    Post pain: Adequate analgesia    Post assessment: no apparent anesthetic complications, tolerated procedure well and no evidence of recall    Last Vitals:   Vitals:    07/22/18 0845 07/22/18 0850 07/22/18 0855 07/22/18 0900   BP: 118/82 115/85 124/83 124/83   Pulse: 72 69 80 76   Resp: 18 15 18 20    Temp:   97.4 ??F (36.3 ??C)    TempSrc:   Axillary    SpO2: 99% 98% 98% 96%   Weight:       Height:            Post vital signs: stable    Level of consciousness: awake, alert  and oriented    Complications: None

## 2018-07-22 NOTE — Unmapped (Signed)
Keep dressing clean and dry, remove in 48 hours  In 48 hours, OK to get wet with soap and water/shower  Elevate x48 hours  Acetaminophen (Tylenol) 1000 mg by mouth every six hours, ibuprofen 600 mg by mouth ever six hours  Limit Acetaminophen to 1000 mg by mouth every six hours (including Norco/Percocet)  Follow up with Dr Jen Mow in 2 weeks for suture removal  Norco Prescription    Instructions Following General Anesthesia    You were given medicines that made you unconscious while a procedure was performed. For as long as 24 hours following this procedure, you may have:  ??? Dizziness, weakness, or drowsiness  ??? Sore throat or hoarseness. ??? Nausea or vomiting     For the first 24 hours following anesthesia:  ?? Have a responsible person with you at all times.  ?? Do not participate in any activities where you could become injured for the next 24 hours, or until you feel normal again.  ?? Do not drive a car or operate heavy machinery.  ?? Do not drink alcohol, take sleeping pills, or other medications that cause drowsiness.  ?? Do not sign important papers or make important decisions.  ?? Only take medicine that has been prescribed by your caregiver.  For pain, only take over-the-counter or prescribed medicines for pain, discomfort, or fever as directed by your caregiver.  ?? If you wear a CPAP/BiPAP device, please wear it anytime that you are resting or feel tired.  ?? You may resume a normal diet unless otherwise directed.  Vomiting may occur if you eat too soon.  When you can drink without vomiting, try water, juice, or soup.  Try solid foods if you feel little or no nausea.  ?? If you have questions or problems that seem related to anesthesia, call the appropriate number below to speak with the on-call anesthesia provider.  ?? [facility]: [phone]  ?? [facility] Center [phone]    SEEK IMMEDIATE MEDICAL CARE IF:   ??? You develop a rash.  ??? You have chest pain.  ??? You  continue to feel sick to your stomach (nauseated) or throw up (vomit). ??? You have difficulty breathing.  ??? You develop any allergic reactions.  ??? You are unable to urinate.       Peripheral Nerve Blocks  Your caregiver may have placed a nerve block in one of your arms or legs to reduce pain and discomfort. This is an injection of local anesthetic(s) and usually provides numbing pain relief up to 18 to 24 hours. You should notify your caregiver if you have any problems.  Be aware that you may lose feeling at and around the surgical area. If numbness happens, take proper measures to avoid injury to the affected extremity.  Do not stand up unassisted if you have a nerve block in your leg. Do not try to lift items if you have had a nerve block in your arm.  Please be careful when placing hot or cold items on a numb extremity.    SEEK IMMEDIATE MEDICAL CARE IF:  ?? You have redness, swelling, pain, or discharge at the injection site.  ?? You develop dizziness, lightheadedness, drowsiness, or confusion.  ?? There is a ringing or buzzing in your ears or blurred vision.  ?? You have a metal taste or numbness/tingling in or around your mouth.  ?? If you have continued numbness or weakness after 24 hours, please contact  [place] and ask for the Pain Service to be paged [phone]).

## 2018-07-22 NOTE — Unmapped (Signed)
INTRA-OP POST BRIEFING NOTE: Shawn Mann      Specimens: None    Prior to leaving the room: Nurse confirmed name of procedure, completion of instrument, sponge & needle counts, reads specimen labels aloud including patient name and addresses any equipment issues? Nurse confirmed wound class. Nurse to surgeon and anesthesia: What are key concerns for recovery and management of the patient?  Yes      Blood products stored at appropriate temperatures prior to return to blood bank (if applicable)? N/A      Patient identification band secured on patient prior to transfer out of the operating room? Yes    Temporary devices implanted for the duration of the surgery removed and evaluated for intactness and completeness prior to closure? N/A      Other Comments:     Signed: Erskin Burnet    Date: 07/22/2018    Time: 8:26 AM

## 2018-07-22 NOTE — Unmapped (Signed)
Addendum  created 07/22/18 1036 by Ellwood Sayers, RN    Intraprocedure Meds edited

## 2018-07-22 NOTE — Unmapped (Signed)
Addendum  created 07/22/18 7829 by Ellwood Sayers, RN    Clinical Note Signed

## 2018-07-22 NOTE — Unmapped (Signed)
Taking PO and fluids, tol well; denies pain; pt Daughter and son in lawat side;  both speak [place] fluently.

## 2018-07-22 NOTE — Unmapped (Addendum)
Regional Block    Block Type -Upper Extremity: brachial plexus, supraclavicular         Laterality:  right    Medications Administered: Pre Sedation / Block Medications  Ropivacaine (PF) (NAROPIN) injection solution 0.5%, 30 mL  Medication administered at:  07/22/2018 7:12 AM        Preanesthetic Checklist  Completed:  patient identified, IV checked, risks and benefits discussed, pre-op evaluation, timeout performed, anesthesia consent, hand hygiene performed, patient being monitored, patient agrees, verbalizes understanding, and wants to proceed and questions answered / anesthesia plan accepted      Block Reason: primary anesthetic     Diagnosis:  postoperative pain management   Patient Location during procedure:  pre-op holding (SDS)     Timeout verification:  correct patient, correct procedure, correct site, allergies reviewed and anticoagulant precautions reviewed    Timeout performed at:  07:11  Timeout performed by:  Ramon Dredge    PROCEDURE:  Block Start Time:  07/22/2018 7:12 AM    Patient Position:  sitting  Prep: Chloraprep    Needle: Block needle 2 in   Injection Technique:  single shot       Ultrasound Guided       Complications:  blood not aspirated, no injection resistance, no paresthesia and no other event       See EMR for periprocedural vitals signs. Stable thoroughout unless otherwise documented      Block End Time:  07/22/2018 7:19 AM        Staffing:  Anesthesiologist:  Perry Mount, MD  Resident/Fellow: Nadara Mode, MD      Performed by:  Resident/Fellow

## 2018-07-22 NOTE — Unmapped (Addendum)
Anesthesia Transfer of Care Note    Patient: Shawn Mann  Procedure(s) Performed: Procedure(s):  Right Ulnar Nerve Transposition at Elbow    Patient location: PACU    Anesthesia type: general    Airway Device on Arrival to PACU/ICU: Nasal Cannula    IV Access: Peripheral    Monitors Recommended to be Used During PACU/ICU: Standard Monitors    Outstanding Issues to Address: None    Level of Consciousness: awake and responds to stimulation    Post vital signs:    Vitals:    07/22/18 0850   BP: 115/85   Pulse: 69   Resp: 15   Temp: 98   SpO2: 98%       Complications: None

## 2018-07-22 NOTE — Unmapped (Signed)
Pr dressed; right arm in sling, up to bedside, states sl dizzy: VSS, skin color pink, W/D; pt in W/C states feeling better and wants to go home; pt and family all agree ok for D/C home

## 2018-07-22 NOTE — Unmapped (Signed)
This is a patient who has right ulnar neuropathy at the elbow.  We are going to the operating room to address these  problems.?? Preoperatively a discussion of risks and benefits was held with  the patient.?? The risks of bleeding, infection, hematoma, seroma, pain,  stiffness, nerve injury, continued numbness and tingling, and need further  procedures were all described.?? The patient understands and wishes to proceed.  ????  PREOPERATIVE DIAGNOSIS(ES):  1. right ulnar neuropathy at the elbow.  ????  POSTOPERATIVE DIAGNOSIS(ES):  1. right ulnar neuropathy at the elbow.  ????  PROCEDURE(S) PERFORMED:  1. Neuroplasty of the right ulnar nerve at the elbow.  CPT [id number]  ????  SURGEON:?? Acie Fredrickson, M.D.  ????  ASSISTANT:?? Sonda Rumble, M.D., Resident.  ????  ANESTHESIA:?? MAC with a preoperative block.  ????  ESTIMATED BLOOD LOSS:?? 50 mL.  ????  FLUIDS:?? Crystalloid per the anesthesia record.  ????  DETAILS OF PROCEDURE(S):?? The patient was seen in the preoperative holding  area and marked.?? The patient was taken to the operating, placed supine on the OR  table, and placed under general anesthesia.?? Perioperative antibiotics were administered.?? PAS boots on and functioning at the time of  induction.?? An active rewarming blanket was in place.?? The right upper extremity  was prepped and draped in usual sterile fashion.?? A time-out was done.?? After  the time-out, extremity was exsanguinated with an Esmarch; tourniquet was  applied at 250 mmHg.  ????  Attention was then turned to the elbow.?? Incision was made at the elbow.  Dissection was taken down to subcutaneous tissue.?? A branch of the medial  antebrachial cutaneous nerve was identified, gently retracted out of the  field.?? The cubital tunnel was opened.?? The ulnar nerve was decompressed both  proximally and distally.?? The medial intermuscular septum of the upper arm  was excised.?? The elbow was then flexed and extended.  There was no subluxation of the ulnar nerve in the cubital tunnel.   Tourniquet was let down, hemostasis  achieved using bipolar.??  The elbow was closed with deep dermal 3-0 Monocryl, running subcuticular 4-0  Monocryl.?? Dressings were applied.?? The patient tolerated the procedure well.  The patient will follow up with me in two weeks.

## 2018-07-22 NOTE — Unmapped (Signed)
Shawn Mann  DEPARTMENT OF ANESTHESIOLOGY  PRE-PROCEDURAL EVALUATION    Banner Colon is a 63 y.o. year old male presenting for:    Procedure(s):  Right Ulnar Nerve Transposition at Elbow    Surgeon:   Acie Fredrickson, MD    Chief Complaint     Cubital tunnel syndrome on right [G56.21]; Medial epicondylitis of elbow, *    Review of Systems     Anesthesia Evaluation    Patient summary reviewed.  All other systems reviewed and are negative.     No history of anesthetic complications   I have reviewed the History and Physical Exam, any relevant changes are noted in the anesthesia pre-operative evaluation.      Cardiovascular:    Exercise tolerance: good    (-) hypertension, CAD, CABG/stent, CHF.    Neuro/Muscoloskeletal/Psych:      (-) seizures, CVA.     Pulmonary:    (+) asthma.    (-) COPD.       GI/Hepatic/Renal:      (-) GERD, liver disease, renal disease.    Endo/Other:        (-) diabetes mellitus, no bleeding disorder, no clotting disorder.       Past Medical History     Past Medical History:   Diagnosis Date   ??? Back pain    ??? Cubital tunnel syndrome, right 07/2018   ??? GERD (gastroesophageal reflux disease)    ??? Thyroid disease        Past Surgical History     History reviewed. No pertinent surgical history.    Family History     History reviewed. No pertinent family history.    Social History     Social History     Socioeconomic History   ??? Marital status: Divorced     Spouse name: Not on file   ??? Number of children: Not on file   ??? Years of education: Not on file   ??? Highest education level: Not on file   Occupational History   ??? Not on file   Social Needs   ??? Financial resource strain: Not on file   ??? Food insecurity:     Worry: Not on file     Inability: Not on file   ??? Transportation needs:     Medical: Not on file     Non-medical: Not on file   Tobacco Use   ??? Smoking status: Never Smoker   ??? Smokeless tobacco: Never Used   Substance and Sexual Activity   ??? Alcohol use: Never     Frequency: Never   ??? Drug use: Never    ??? Sexual activity: Not on file   Lifestyle   ??? Physical activity:     Days per week: Not on file     Minutes per session: Not on file   ??? Stress: Not on file   Relationships   ??? Social connections:     Talks on phone: Not on file     Gets together: Not on file     Attends religious service: Not on file     Active member of club or organization: Not on file     Attends meetings of clubs or organizations: Not on file     Relationship status: Not on file   ??? Intimate partner violence:     Fear of current or ex partner: Not on file     Emotionally abused: Not on file     Physically  abused: Not on file     Forced sexual activity: Not on file   Other Topics Concern   ??? Caffeine Use Not Asked   ??? Occupational Exposure Not Asked   ??? Exercise Not Asked   ??? Seat Belt Not Asked   Social History Narrative   ??? Not on file       Medications     Allergies:  No Known Allergies    Home Meds:  Prior to Admission medications as of 07/22/18 0658   Medication Sig Taking?   acetaminophen (TYLENOL) 500 MG tablet TK 1 T PO Q 12 H PRN Yes   amitriptyline (ELAVIL) 10 MG tablet Take 10 mg by mouth at bedtime. Yes   famotidine (PEPCID) 20 MG tablet Take by mouth 2 times a day.        Yes   levothyroxine (SYNTHROID, LEVOTHROID) 50 MCG tablet Take by mouth every morning before breakfast.        Yes   montelukast (SINGULAIR) 10 mg tablet at bedtime.        Yes   naproxen (NAPROSYN) 500 MG tablet  Yes   pantoprazole (PROTONIX) 40 MG tablet Take 40 mg by mouth 2 times a day.        Yes   pravastatin (PRAVACHOL) 40 MG tablet Take by mouth at bedtime.        Yes   traZODone (DESYREL) 50 MG tablet  Yes   albuterol (PROVENTIL;VENTOLIN;PROAIR) 90 mcg/actuation inhaler 2 puffs every 4 hours as needed.           HYDROcodone-acetaminophen (NORCO) 5-325 mg per tablet Take 1-2 tablets by mouth every 6 hours as needed for up to 7 days.    hydrOXYzine pamoate (VISTARIL) 25 MG capsule Take by mouth 3 times a day as needed.           ondansetron (ZOFRAN-ODT) 4  MG disintegrating tablet DIS ONE T PO  Q 8 H PRF NAUSEA.    STOOL SOFTENER 100 mg capsule     SUMAtriptan (IMITREX) 50 MG tablet every 2 hours as needed.               Inpatient Meds:  Scheduled:   ??? acetaminophen       ??? celecoxib       ??? gabapentin         Continuous:   ??? lactated Ringers         PRN: ceFAZolin (ANCEF) IVPB, dextrose 10% in water **OR** dextrose 10% in water, glucose, lidocaine (PF) 2% (20 mg/mL)    Vital Signs     Wt Readings from Last 3 Encounters:   07/22/18 142 lb (64.4 kg)     Ht Readings from Last 3 Encounters:   07/22/18 5' 4 (1.626 m)     Temp Readings from Last 3 Encounters:   07/22/18 97.9 ??F (36.6 ??C) (Oral)     BP Readings from Last 3 Encounters:   07/22/18 126/72   07/02/18 110/74     Pulse Readings from Last 3 Encounters:   07/22/18 62   07/02/18 70     @LASTSAO2 (3)@    Physical Exam     Airway:     Mallampati: III  TM distance: > = 3 FB  Neck ROM: full  (-) neck not short, not intubated      Dental:        Pulmonary:       Breath sounds clear to auscultation.  Cardiovascular:     Rhythm: regular  Rate: normal    Neuro/Musculoskeletal/Psych:    Mental status: alert and oriented to person, place and time.          Abdominal:       Current OB Status:       Other Findings:        Laboratory Data     No results found for: WBC, HGB, HCT, MCV, PLT    No results found for: ABORH    No results found for: GLUCOSE, BUN, CO2, CREATININE, K, NA, CL, CALCIUM, ALBUMIN, PROT, ALKPHOS, ALT, AST, BILITOT    No results found for: PTT, INR    No results found for: PREGTESTUR, PREGSERUM, HCG, HCGQUANT    Anesthesia Plan     ASA 2         Current non-smoker    Anesthesia Type:  regional.      PONV Risk Factors: current non-smoker,  plan for postoperative opioid use.                  (Regional with GA backup, PIV x1, standard monitors, SDS/PACU postop)  Anesthetic plan and risks discussed with patient.    Plan, alternatives, and risks of anesthesia, including death, have been explained to and  discussed with the patient/legal guardian.  By my assessment, the patient/legal guardian understands and agrees.  Scenario presented in detail.  Questions answered.          Plan discussed with resident, CRNA and attending.

## 2018-07-22 NOTE — Unmapped (Signed)
H&P reviewed and updated.  Pt's questions answered.  Plan for surgery reviewed.  Pt ready for surgery.

## 2018-07-24 ENCOUNTER — Encounter: Admit: 2018-07-24 | Discharge: 2018-07-24 | Payer: MEDICAID

## 2018-07-24 ENCOUNTER — Ambulatory Visit: Admit: 2018-07-24 | Discharge: 2018-07-30 | Payer: MEDICAID

## 2018-07-24 DIAGNOSIS — G5621 Lesion of ulnar nerve, right upper limb: Secondary | ICD-10-CM

## 2018-07-24 NOTE — Unmapped (Signed)
Chief Complaint   Patient presents with   ??? Post-op Evaluation     s/p Right Ulnar Nerve Transposition at Elbow 07-22-18         HPI  patients here to follow-up. He says that his numbness is improved.      Current Outpatient Medications   Medication Sig   ??? acetaminophen TK 1 T PO Q 12 H PRN   ??? albuterol 2 puffs every 4 hours as needed.          ??? amitriptyline Take 10 mg by mouth at bedtime.   ??? famotidine Take by mouth 2 times a day.          ??? HYDROcodone-acetaminophen Take 1-2 tablets by mouth every 6 hours as needed for up to 7 days.   ??? hydrOXYzine pamoate Take by mouth 3 times a day as needed.          ??? levothyroxine Take by mouth every morning before breakfast.          ??? montelukast at bedtime.          ??? naproxen    ??? ondansetron DIS ONE T PO  Q 8 H PRF NAUSEA.   ??? pantoprazole Take 40 mg by mouth 2 times a day.          ??? pravastatin Take by mouth at bedtime.          ??? Stool Softener    ??? SUMAtriptan every 2 hours as needed.          ??? traZODone      No current facility-administered medications for this visit.        No Known Allergies    The following portions of the patient's history were reviewed and updated as appropriate: allergies, current medications, past family history, past medical history, past social history, past surgical history and problem list.      Examination:  Incision healing well    Assessment   That his fist ulnar nerve decompression at elbow         Plan   Referred to therapy to work on gentle range of motion, follow-up in 2 weeks       This note was transcribed utilizing voice recognition software. While reasonable attempts have been made to identify and correct transcription errors, they may be present.

## 2018-07-24 NOTE — Unmapped (Signed)
HAND THERAPY EVALUATION AND PLAN OF CARE    Date:07/24/2018                                             Name:  Shawn Mann     DOB: Jan 26, 1956    MRN: [id number]  INSURANCE: Payor: MEDICAID [place] / Plan: MEDICAID [place] / Product Type: Medicaid /   VISIT #: 1    PMH: Reviewed past medical history in the electronic chart.   Medication Reviewed:   Reviewed medications in the electronic chart.    Subjective/Objective:    Pt is a 63 y.o. male who is right hand dominant; referred by Acie Fredrickson, MD.  DOI: gradual onset DOS: 07/22/2018    Medical diagnosis: POSTOPERATIVE DIAGNOSIS(ES):  1. right ulnar neuropathy at the elbow.  ????  PROCEDURE(S) PERFORMED:  1. Neuroplasty of the right ulnar nerve at the elbow.  CPT [phone]  Treatment diagnosis: right arm pain/stiffness  MD requesting eval and treat with custom orthosis to protect above with AROM, edema control, MOC, therapeutic exercise, therapeutic activities.   Precautions: to tolerance      (Prior Level of Function):          Patient reports: full/highest functional use of involved upper extremity prior to incident for all ADLs, IADLs, and/or work related activities.    (Current Level of Function):          Patient reports  ~ 50% of full functional use the involved extremity when compared to uninvolved extremity and or previous highest level of function.  Pt reports difficulty with all grooming, bathing, dressing, lifting, functional grasping and writing      Pain: 6/10 (0-10 pain scale and report of highest pain with or without activity)     Pain description: sore    Sensation: WNL                                  Wound/Scarring: closed and healing incision         Edema: Decreased dorsal wrinkling of right hand. Pitting edema 2/4    ROM:  R elbow ext/flex 5/125   R wrist ext/flex 3/4 AROM    R digits full AROM         Strength:  No functional grasp and not tested at this time secondary to precautions        Treatment Provided:          Therapeutic Exercise Patient received and  instructed in AAAROM, edema control, wound care. Initiated HEP     Based on assessment of medical diagnosis and comorbidities, and patient's date of onset. Patient was I post instructions.               Patient/Family agreed to therapy treatment on this date  -  YES     Learning Needs Assessment:    [x]  Patient is able to communicate with therapist and verbalize understanding of directions/instructions  []  Patient is unable to communicate with therapist and/or verbalize understanding of directions/understanding due to the following barriers to learning:   []   Reading  [] Language  [] Visual  [] Hearing  []  Other:    Is an interpreter required? [] Yes   [x]  No      Assessment/Plan: Pt is now 0 weeks 2 days post above with decreased  ROM and functional strength for ADLS and or work activity. Good rehab potential. No barrieries to tx noted. Pt will be seen 1 times a week for 4 weeks.  Will utilize above MD/therapist requested treatment plan for goals as follows:    STG         Patient will be I HEP and all precautions 1-2 visits. Goal met 90 %       LTG    Within 10 degrees of full functional ROM and use for all ADLS and or work activity in 6 weeks. Goal met 0 %    Pt will report involved extremity ~ 80-90% fully functional by completion of tx in 6 visits or 6 weeks of treatment. Goal met 0 %       I:    1 units 25 min Eval   2 units 25 min 1:1 Tx Exercise        3 units 50 min Total time/units         Therapist Signature:   Gerome Apley Cace Osorto MOT OTR/L                                                                 Physical Certification:  I certify that the above patient is under my care and requires the above services.  These professional services are to be provided from an established plan related to the diagnosis and reviewed by me every 90 days.    Physician's Signature: __________________________ Date:   ___________

## 2019-02-28 IMAGING — NM NM HEPATO W/GB/PHARM/[PERSON_NAME]
3 series · 13 of 13 positions shown · non-contrast
Comparison: None.

CLINICAL DATA: Right upper quadrant pain

EXAM:
NUCLEAR MEDICINE HEPATOBILIARY IMAGING WITH GALLBLADDER EF
VIEWS:
Anterior, right lateral right upper quadrant
RADIOPHARMACEUTICALS:  5.1 mCi Nc-PPm  Choletec IV

[he hepatobiliary · 2.26mm/px · 1 of 1 slices shown (1 of 3)]
[im 1/1]
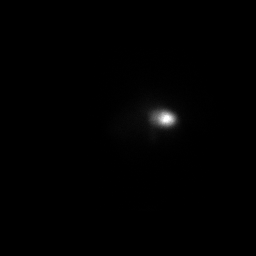

[he hepatobiliary · 4.52mm/px · 6 of 60 frames shown (2 of 3)]
[frame 6/60]
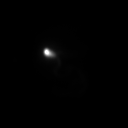
[frame 16/60]
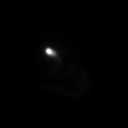
[frame 26/60]
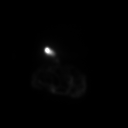
[frame 36/60]
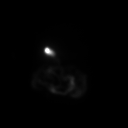
[frame 46/60]
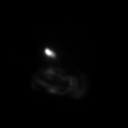
[frame 56/60]
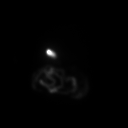

[he hepatobiliary · 4.52mm/px · 6 of 60 frames shown (3 of 3)]
[frame 6/60]
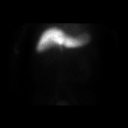
[frame 16/60]
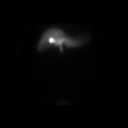
[frame 26/60]
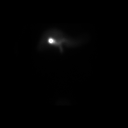
[frame 36/60]
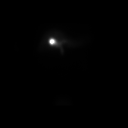
[frame 46/60]
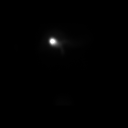
[frame 56/60]
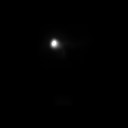

[13 of 13 positions shown; findings below may reference images not displayed]

FINDINGS: Liver uptake of radiotracer is unremarkable. There is prompt
visualization of gallbladder and small bowel, indicating patency of
the cystic and common bile ducts. The patient consumed 8 ounces of
Ensure Enlive with calculation of the computer generated ejection
fraction of radiotracer from the gallbladder. The patient did not
experience clinical symptoms with the oral Ensure consumption. The
computer generated ejection fraction of radiotracer from the
gallbladder is normal at 55%, normal greater than 33% using the oral
agent.
IMPRESSION: Study within normal limits.

## 2019-03-04 ENCOUNTER — Emergency Department: Admit: 2019-03-04 | Payer: MEDICAID | Primary: Adult Health

## 2019-03-04 ENCOUNTER — Emergency Department: Admit: 2019-03-05 | Payer: MEDICAID | Primary: Adult Health

## 2019-03-04 ENCOUNTER — Inpatient Hospital Stay
Admit: 2019-03-04 | Discharge: 2019-03-05 | Disposition: A | Discharge status: Home / Self Care | Payer: MEDICAID | Attending: Emergency Medicine

## 2019-03-04 DIAGNOSIS — R1084 Generalized abdominal pain: Secondary | ICD-10-CM

## 2019-03-04 LAB — CBC WITH AUTO DIFFERENTIAL
Basophils %: 0.7 %
Basophils Absolute: 0 10*3/uL (ref 0.0–0.2)
Eosinophils %: 0 %
Eosinophils Absolute: 0 10*3/uL (ref 0.0–0.6)
Hematocrit: 44.5 % (ref 40.5–52.5)
Hemoglobin: 14.8 g/dL (ref 13.5–17.5)
Lymphocytes %: 19.8 %
Lymphocytes Absolute: 1.3 10*3/uL (ref 1.0–5.1)
MCH: 30 pg (ref 26.0–34.0)
MCHC: 33.3 g/dL (ref 31.0–36.0)
MCV: 90.1 fL (ref 80.0–100.0)
MPV: 8.7 fL (ref 5.0–10.5)
Monocytes %: 7.8 %
Monocytes Absolute: 0.5 10*3/uL (ref 0.0–1.3)
Neutrophils %: 71.7 %
Neutrophils Absolute: 4.6 10*3/uL (ref 1.7–7.7)
Platelets: 180 10*3/uL (ref 135–450)
RBC: 4.94 M/uL (ref 4.20–5.90)
RDW: 13.2 % (ref 12.4–15.4)
WBC: 6.3 10*3/uL (ref 4.0–11.0)

## 2019-03-04 LAB — LACTIC ACID: Lactic Acid: 2.3 mmol/L — ABNORMAL HIGH (ref 0.4–2.0)

## 2019-03-04 MED ORDER — SODIUM CHLORIDE 0.9 % IV BOLUS
0.9 | Freq: Once | INTRAVENOUS | Status: AC
Start: 2019-03-04 — End: 2019-03-05
  Administered 2019-03-05: 01:00:00 1000 mL via INTRAVENOUS

## 2019-03-04 NOTE — ED Provider Notes (Signed)
Emergency Department Encounter  Location: [facility]    Patient: [facility]  MRN: [id number]  DOB: 20-Aug-1955  Date of evaluation: 03/04/2019  ED Provider: Magda Paganini, MD      [facility] was checked out to me by Dr. Alexis [facility]. Please see his/her initial documentation for details of Michael patient's initial ED presentation, physical exam and completed studies.    In brief, [facility] is a 63 y.o. male who presented to [facility] emergency department   Abdominal pain and chest pain.  Also shortness of breath.  Currently studies pending: CT scans.    I have reviewed and interpreted all of Michael currently available lab results and diagnostics from this visit:  Results for orders placed or performed during Michael hospital encounter of 03/04/19   CBC Auto Differential   Result Value Ref Range    WBC 6.3 4.0 - 11.0 K/uL    RBC 4.94 4.20 - 5.90 M/uL    Hemoglobin 14.8 13.5 - 17.5 g/dL    Hematocrit 29.9 37.1 - 52.5 %    MCV 90.1 80.0 - 100.0 fL    MCH 30.0 26.0 - 34.0 pg    MCHC 33.3 31.0 - 36.0 g/dL    RDW 69.6 78.9 - 38.1 %    Platelets 180 135 - 450 K/uL    MPV 8.7 5.0 - 10.5 fL    Neutrophils % 71.7 %    Lymphocytes % 19.8 %    Monocytes % 7.8 %    Eosinophils % 0.0 %    Basophils % 0.7 %    Neutrophils Absolute 4.6 1.7 - 7.7 K/uL    Lymphocytes Absolute 1.3 1.0 - 5.1 K/uL    Monocytes Absolute 0.5 0.0 - 1.3 K/uL    Eosinophils Absolute 0.0 0.0 - 0.6 K/uL    Basophils Absolute 0.0 0.0 - 0.2 K/uL   Lactic Acid, Plasma   Result Value Ref Range    Lactic Acid 2.3 (H) 0.4 - 2.0 mmol/L   SPECIMEN REJECTION   Result Value Ref Range    Rejected Test cmp,trop,pct,cr     Reason for Rejection see below    Comprehensive Metabolic Panel w/ Reflex to MG   Result Value Ref Range    Sodium 133 (L) 136 - 145 mmol/L    Potassium reflex Magnesium 3.9 3.5 - 5.1 mmol/L    Chloride 102 99 - 110 mmol/L    CO2 20 (L) 21 - 32 mmol/L    Anion Gap 11 3 - 16    Glucose 95 70 - 99 mg/dL    BUN 13 7 - 20 mg/dL    CREATININE 1.1 0.8 - 1.3  mg/dL    GFR Non-African American >60 >60    GFR African American >60 >60    Calcium 8.2 (L) 8.3 - 10.6 mg/dL    Total Protein 6.2 (L) 6.4 - 8.2 g/dL    Alb 3.5 3.4 - 5.0 g/dL    Albumin/Globulin Ratio 1.3 1.1 - 2.2    Total Bilirubin 0.5 0.0 - 1.0 mg/dL    Alkaline Phosphatase 88 40 - 129 U/L    ALT 9 (L) 10 - 40 U/L    AST 20 15 - 37 U/L    Globulin 2.7 g/dL   Procalcitonin   Result Value Ref Range    Procalcitonin 0.03 0.00 - 0.15 ng/mL   Troponin   Result Value Ref Range    Troponin <0.01 <0.01 ng/mL   Sedimentation Rate  Result Value Ref Range    Sed Rate 9 0 - 20 mm/Hr   EKG 12 Lead   Result Value Ref Range    Ventricular Rate 85 BPM    Atrial Rate 89 BPM    QRS Duration 74 ms    Q-T Interval 372 ms    QTc Calculation (Bazett) 442 ms    R Axis 6 degrees    T Axis 20 degrees    Diagnosis       Accelerated Junctional rhythm with occasional Premature ventricular complexesST & T wave abnormality, consider anterior ischemiaAbnormal ECG     Ct Head Wo Contrast    Result Date: 03/04/2019  EXAMINATION: CT OF Michael HEAD WITHOUT CONTRAST  03/04/2019 7:22 pm TECHNIQUE: CT of Michael head was performed without Michael administration of intravenous contrast. Dose modulation, iterative reconstruction, and/or weight based adjustment of the mA/kV was utilized to reduce Michael radiation dose to as low as reasonably achievable. COMPARISON: None. HISTORY: Reason for Exam: AMS Acuity: Acute Type of Exam: Initial FINDINGS: BRAIN/VENTRICLES: No acute intracranial hemorrhage, mass effect or midline shift.  No abnormal extra-axial fluid collection.  Michael gray-white differentiation is maintained without evidence of an acute infarct.  No evidence of hydrocephalus. ORBITS: Michael visualized portion of Michael orbits demonstrate no acute abnormality. SINUSES: Moderate mucosal thickening of Michael right sphenoid sinus.  Partially visualized mild mucosal thickening left maxillary sinus.  Mild mucosal thickening of Michael bilateral ethmoid sinuses anteriorly including  Michael frontoethmoidal junctions.  Mastoid air cells are well aerated. SOFT TISSUES/SKULL:  No acute abnormality of Michael visualized skull or soft tissues.     No acute intracranial abnormality.  Incidental sinusitis as described.     Ct Abdomen Pelvis W Iv Contrast Additional Contrast? None    Result Date: 03/04/2019  EXAMINATION: CT OF Michael ABDOMEN AND PELVIS WITH CONTRAST 03/04/2019 10:04 pm TECHNIQUE: CT of Michael abdomen and pelvis was performed with Michael administration of intravenous contrast. Multiplanar reformatted images are provided for review. Dose modulation, iterative reconstruction, and/or weight based adjustment of the mA/kV was utilized to reduce Michael radiation dose to as low as reasonably achievable. COMPARISON: None. HISTORY: ORDERING SYSTEM PROVIDED HISTORY: Abdominal pain TECHNOLOGIST PROVIDED HISTORY: Reason for exam:->Abdominal pain Additional Contrast?->None Reason for Exam: Abdominal pain Acuity: Acute Type of Exam: Initial FINDINGS: Lung Bases: There is bibasilar compressive atelectasis. Liver: No hepatic lesions identified. Bile ducts:  No evidence of intrahepatic or extrahepatic biliary dilatation. Gallbladder is contracted Pancreas: No pancreatic lesions.  Michael pancreatic duct is not dilated. Adrenal glands: Normal in appearance. Kidneys: No evidence of hydronephrosis.  No abnormal renal lesions. Spleen: No enhancing lesions. Bowel: No evidence of bowel obstruction.  Appendix is normal in appearance. Peritoneum/Retroperitoneum: No abnormal pelvic lymphadenopathy. Pelvis: Bladder is unremarkable. Bones/Soft tissues: No aggressive osseous lesions.     No acute intra-abdominal intrapelvic findings.     Xr Chest Portable    Result Date: 03/04/2019  EXAMINATION: ONE XRAY VIEW OF Michael CHEST 03/04/2019 7:18 pm COMPARISON: 07/15/2018 HISTORY: ORDERING SYSTEM PROVIDED HISTORY: R/O PNA TECHNOLOGIST PROVIDED HISTORY: Reason for exam:->R/O PNA Reason for Exam: Shortness of Breath (in by EMS, from home where he  reports chest pain and fever, however on arrival is breathing very fast and all this started ago, pt hx of athma, possibly asthma attack. ) Acuity: Unknown Type of Exam: Unknown FINDINGS: Michael lungs are without acute focal process.  There is no effusion or pneumothorax. Michael cardiomediastinal silhouette is stable. Michael osseous structures are stable.  No acute process.     Cta Pulmonary W Contrast    Result Date: 03/04/2019  EXAMINATION: CTA OF Michael CHEST 03/04/2019 10:04 pm TECHNIQUE: CTA of Michael chest was performed after Michael administration of intravenous contrast.  Multiplanar reformatted images are provided for review.  MIP images are provided for review. Dose modulation, iterative reconstruction, and/or weight based adjustment of the mA/kV was utilized to reduce Michael radiation dose to as low as reasonably achievable. COMPARISON: Chest radiograph earlier same date and prior CT February 07, 2018. HISTORY: ORDERING SYSTEM PROVIDED HISTORY: Rule out PE TECHNOLOGIST PROVIDED HISTORY: Reason for exam:->Rule out PE Reason for Exam: Rule out PE Acuity: Acute Type of Exam: Initial FINDINGS: Pulmonary Arteries: Evaluation Michael subsegmental branches is nondiagnostic due to motion artifact.  No evidence of intraluminal filling defect to suggest pulmonary embolism.  Main pulmonary artery is normal in caliber. Mediastinum: No evidence of mediastinal lymphadenopathy.  Few scattered mediastinal lymph nodes are again seen, similar in appearance to prior.  Michael heart and pericardium demonstrate no acute abnormality.  There is no acute abnormality of Michael thoracic aorta. Lungs/pleura: Michael central airways are patent.  There are patchy areas of airspace consolidation and ground-glass opacity within both both upper and lower lungs.  There is mild interstitial thickening.  Findings are most pronounced within Michael lung bases.  No pneumothorax or pleural effusion identified. Upper Abdomen: See dedicated CT abdomen same date. Soft  Tissues/Bones: No acute bone or soft tissue abnormality.     No evidence of pulmonary embolism on limited exam. Bilateral airspace disease with some interstitial thickening concerning for edema. There is more focal consolidation in Michael lung bases concerning for pneumonia in Michael correct clinical setting.  An atypical viral pneumonia is not excluded.       Final ED Course and MDM:  Adult male who comes in with abdominal pain chest pain and shortness of breath.  Patient has a history of asthma.  A time I see this patient he has no symptoms.  He did have mild abdominal discomfort prior to me going into Michael room for which I had ordered a GI cocktail but he is no longer uncomfortable.  He has no abdominal tenderness her heart and lung sounds are normal.  His work-up is nondiagnostic.    Patient feels better and has a primary care provider.  He mentions that he has been having difficulty defecating for Michael past few days medications for that as prescribed.  Follow-up in Michael primary care setting is recommended.  Return instructions discussed.        Final Impression      1. Generalized abdominal pain    2. Chest pain, unspecified type        DISPOSITION Decision To Discharge 03/05/2019 12:16:35 AM     (Please note that portions of this note may have been completed with a voice recognition program. Efforts were made to edit Michael dictations but occasionally words are mis-transcribed.)    Virgia Land, MD  [facility]      Keeven Matty, MD  03/05/19 [phone]

## 2019-03-04 NOTE — ED Provider Notes (Signed)
Primary Care Physician: Ulice Bold, APRN - CNP   Attending Physician: Magda Paganini, MD     History   Chief Complaint   Patient presents with   . Shortness of Breath     in by EMS, from home where he reports chest pain and fever, however on arrival is breathing very fast and all this started ago, pt hx of athma, possibly asthma attack.         HPI   Michael Knight is a 63 y.o. male history of asthma, hyperlipidemia, GERD, presenting this evening brought by EMS with complaint of chest pain associated with a fever.  Does not speak English and information was obtained through interpretation of his daughter and then his son.  He stated that he got up today and was complaining of chest pain associated with fever or feeling hot.    Past Medical History:   Diagnosis Date   . Asthma    . Back pain    . Dyslipidemia    . GERD (gastroesophageal reflux disease)    . Hypothyroidism         Past Surgical History:   Procedure Laterality Date   . UPPER GASTROINTESTINAL ENDOSCOPY N/A 02/08/2018    EGD BIOPSY performed by Kandice Robinsons, MD at [facility]        History reviewed. No pertinent family history.     Social History     Socioeconomic History   . Marital status: Unknown     Spouse name: None   . Number of children: None   . Years of education: None   . Highest education level: None   Occupational History   . None   Social Needs   . Financial resource strain: None   . Food insecurity     Worry: None     Inability: None   . Transportation needs     Medical: None     Non-medical: None   Tobacco Use   . Smoking status: Former Games developer   . Smokeless tobacco: Never Used   Substance and Sexual Activity   . Alcohol use: Not Currently   . Drug use: Never   . Sexual activity: Not Currently   Lifestyle   . Physical activity     Days per week: None     Minutes per session: None   . Stress: None   Relationships   . Social Wellsite geologist on phone: None     Gets together: None     Attends religious service:  None     Active member of club or organization: None     Attends meetings of clubs or organizations: None     Relationship status: None   . Intimate partner violence     Fear of current or ex partner: None     Emotionally abused: None     Physically abused: None     Forced sexual activity: None   Other Topics Concern   . None   Social History Narrative   . None        Review of Systems   10 total systems reviewed and found to be negative unless otherwise noted in HPI     Physical Exam   BP 113/76   Pulse 89   Temp 98.6 F (37 C)   Resp 22   SpO2 95%      CONSTITUTIONAL: Appears to be in distress, hyperventilating and anxious  HEAD: atraumatic,  normocephalic   EYES: PERRL, No injection, discharge or scleral icterus.   ENT: Moist mucous membranes.    NECK: Normal ROM, NO LAD   CARDIOVASCULAR: Regular rate and rhythm. No murmurs or gallop.   PULMONARY/CHEST: Airway patent. No retractions. Breath sounds clear with good air entry bilaterally.   ABDOMEN: Soft, Non-distended but tenderness diffusely without signs of peritonitis  SKIN: Acyanotic, warm, dry   MUSCULOSKELETAL: No swelling, tenderness or deformity   NEUROLOGICAL: Awake and oriented x 3. Pulses intact. Grossly nonfocal   Nursing note and vitals reviewed.     ED Course & Medical Decision Making   Medications   LORazepam (ATIVAN) tablet 1 mg (has no administration in time range)   iopamidol (ISOVUE-370) 76 % injection 75 mL (has no administration in time range)   aluminum & magnesium hydroxide-simethicone (MAALOX) 30 mL, lidocaine viscous hcl (XYLOCAINE) 5 mL (GI COCKTAIL) (has no administration in time range)   0.9 % sodium chloride bolus (1,000 mLs Intravenous New Bag 03/04/19 2112)      Labs Reviewed   LACTIC ACID, PLASMA - Abnormal; Notable for the following components:       Result Value    Lactic Acid 2.3 (*)     All other components within normal limits    Narrative:     Performed at:  [facility]  [address]   [place], [place] [address]   Phone [phone]   COMPREHENSIVE METABOLIC PANEL W/ REFLEX TO MG FOR LOW K - Abnormal; Notable for the following components:    Sodium 133 (*)     CO2 20 (*)     Calcium 8.2 (*)     Total Protein 6.2 (*)     ALT 9 (*)     All other components within normal limits    Narrative:     Performed at:  [facility]  [address]  [place], [place] [address]   Phone [phone]   CULTURE, URINE   CULTURE, BLOOD 1   CULTURE, BLOOD 2   CBC WITH AUTO DIFFERENTIAL    Narrative:     Performed at:  [facility]  [address]  [place], [place] [address]   Phone [phone]   SPECIMEN REJECTION    Narrative:     CALL  [facility]  [facility] tel. [phone],  Rejected Test CMP, TROP, CRP, PCT,ESR Called to:RN Orie Fisherman, 03/04/2019  20:06, by Sherrine Maples  Performed at:  [facility]  [address],  [place], [place] [address]   Phone [phone]   PROCALCITONIN    Narrative:     Performed at:  [facility]  [address],  [place], [place] [address]   Phone [phone]   TROPONIN    Narrative:     Performed at:  [facility]  [address]  [place], [place] [address]   Phone [phone]   SEDIMENTATION RATE    Narrative:     Performed at:  [facility]  [address],  [place], [place] [address]   Phone [phone]   C-REACTIVE PROTEIN      CT ABDOMEN PELVIS W IV CONTRAST Additional Contrast? None   Final Result   No acute intra-abdominal intrapelvic findings.         CTA PULMONARY W CONTRAST   Final Result   No evidence of  pulmonary embolism on limited exam.      Bilateral airspace disease with some interstitial thickening concerning for   edema.      There is more focal consolidation in the lung bases concerning for pneumonia   in the correct clinical setting.  An atypical viral pneumonia is not excluded.         CT Head WO Contrast   Final Result    No acute intracranial abnormality.  Incidental sinusitis as described.         XR CHEST PORTABLE   Final Result   No acute process.            Ct Head Wo Contrast Result Date: 03/04/2019  EXAMINATION: CT OF THE HEAD WITHOUT CONTRAST  03/04/2019 7:22 pm TECHNIQUE: CT of the head was performed without the administration of intravenous contrast. Dose modulation, iterative reconstruction, and/or weight based adjustment of the mA/kV was utilized to reduce the radiation dose to as low as reasonably achievable. COMPARISON: None. HISTORY: Reason for Exam: AMS Acuity: Acute Type of Exam: Initial FINDINGS: BRAIN/VENTRICLES: No acute intracranial hemorrhage, mass effect or midline shift.  No abnormal extra-axial fluid collection.  The gray-white differentiation is maintained without evidence of an acute infarct.  No evidence of hydrocephalus. ORBITS: The visualized portion of the orbits demonstrate no acute abnormality. SINUSES: Moderate mucosal thickening of the right sphenoid sinus.  Partially visualized mild mucosal thickening left maxillary sinus.  Mild mucosal thickening of the bilateral ethmoid sinuses anteriorly including the frontoethmoidal junctions.  Mastoid air cells are well aerated. SOFT TISSUES/SKULL:  No acute abnormality of the visualized skull or soft tissues.     No acute intracranial abnormality.  Incidental sinusitis as described.     Xr Chest Portable Result Date: 03/04/2019  EXAMINATION: ONE XRAY VIEW OF THE CHEST 03/04/2019 7:18 pm COMPARISON: 07/15/2018 HISTORY: ORDERING SYSTEM PROVIDED HISTORY: R/O PNA TECHNOLOGIST PROVIDED HISTORY: Reason for exam:->R/O PNA Reason for Exam: Shortness of Breath (in by EMS, from home where he reports chest pain and fever, however on arrival is breathing very fast and all this started ago, pt hx of athma, possibly asthma attack. ) Acuity: Unknown Type of Exam: Unknown FINDINGS: The lungs are without acute focal process.  There is no effusion or pneumothorax. The  cardiomediastinal silhouette is stable. The osseous structures are stable.     No acute process.        EKG INTERPRETATION:  EKG by my preliminary interpretation shows sinus junctional rhythm rhythm with rate of 85, normal axis, normal intervals, with no ST changes indicative of ischemia at this time.    PROCEDURES:   Procedures    ASSESSMENT AND PLAN:  Michael Knight is a 63 y.o. male presenting with multiple complaints including chest pain, abdominal pain.  On exam hemodynamic stable, afebrile but appears to be in distress with some abdominal tenderness.  Labs obtained including troponin, lactate CBC, CMP sed rate and CRP.  Results unremarkable apart from a lactate of 2.3.  EKG showed no ST elevation but some junctional rhythm which is new.  With his complaint of headache a CT of the head was obtained and showed no abnormal findings.  Because of his complaint of chest pain and abdominal pain, I did obtain a CT of his abdomen as well as chest.  Those showed no abdominal surgical pathology however concerning for pneumonia which could be the cause of his chest pain.  At this time, patient has been signed out to Dr. Glorianne Manchester who will  follow-up and disposition of patient accordingly.     ClINICAL IMPRESSION:  1. Generalized abdominal pain    2. Chest pain, unspecified type        DISPOSITION    Pending complete evaluation  Macey Wurtz A Sadie Hazelett, MD (electronically signed)  03/04/2019  _________________________________________________________________________________________  _________________________________________________________________________________________  This record is transcribed utilizing voice recognition technology. There are inherent limitations in this technology. In addition, there may be limitations in editing of this report. If there are any discrepancies, please contact me directly.        Michael Craver Tomi Likens, MD  03/04/19 [phone]

## 2019-03-04 NOTE — ED Notes (Signed)
Bed: 30  Expected date:   Expected time:   Means of arrival:   Comments:  EMS     Polo Riley, RN  03/04/19 1840

## 2019-03-05 LAB — C-REACTIVE PROTEIN: CRP: 15.7 mg/L — ABNORMAL HIGH (ref 0.0–5.1)

## 2019-03-05 LAB — COMPREHENSIVE METABOLIC PANEL W/ REFLEX TO MG FOR LOW K
ALT: 9 U/L — ABNORMAL LOW (ref 10–40)
AST: 20 U/L (ref 15–37)
Albumin/Globulin Ratio: 1.3 (ref 1.1–2.2)
Albumin: 3.5 g/dL (ref 3.4–5.0)
Alkaline Phosphatase: 88 U/L (ref 40–129)
Anion Gap: 11 (ref 3–16)
BUN: 13 mg/dL (ref 7–20)
CO2: 20 mmol/L — ABNORMAL LOW (ref 21–32)
Calcium: 8.2 mg/dL — ABNORMAL LOW (ref 8.3–10.6)
Chloride: 102 mmol/L (ref 99–110)
Creatinine: 1.1 mg/dL (ref 0.8–1.3)
GFR African American: >60 (ref >60)
GFR Non-African American: >60 (ref >60)
Globulin: 2.7 g/dL
Glucose: 95 mg/dL (ref 70–99)
Potassium reflex Magnesium: 3.9 mmol/L (ref 3.5–5.1)
Sodium: 133 mmol/L — ABNORMAL LOW (ref 136–145)
Total Bilirubin: 0.5 mg/dL (ref 0.0–1.0)
Total Protein: 6.2 g/dL — ABNORMAL LOW (ref 6.4–8.2)

## 2019-03-05 LAB — SPECIMEN REJECTION

## 2019-03-05 LAB — EKG 12-LEAD
Atrial Rate: 89 {beats}/min
Q-T Interval: 372 ms
QRS Duration: 74 ms
QTc Calculation (Bazett): 442 ms
R Axis: 6 degrees
T Axis: 20 degrees
Ventricular Rate: 85 {beats}/min

## 2019-03-05 LAB — SEDIMENTATION RATE: Sed Rate: 9 mm/Hr (ref 0–20)

## 2019-03-05 LAB — PROCALCITONIN: Procalcitonin: 0.03 ng/mL (ref 0.00–0.15)

## 2019-03-05 LAB — TROPONIN: Troponin: <0.01 ng/mL (ref <0.01)

## 2019-03-05 MED ORDER — GLYCERIN (ADULT) 2 G RE SUPP
2 g | Freq: Once | RECTAL | 0 refills | Status: AC
Start: 2019-03-05 — End: 2019-03-05

## 2019-03-05 MED ORDER — LORAZEPAM 1 MG PO TABS
1 MG | ORAL | Status: DC | PRN
Start: 2019-03-05 — End: 2019-03-05

## 2019-03-05 MED ORDER — DOCUSATE SODIUM 100 MG PO CAPS
100 MG | ORAL_CAPSULE | Freq: Two times a day (BID) | ORAL | 0 refills | Status: AC
Start: 2019-03-05 — End: ?

## 2019-03-05 MED ORDER — IOPAMIDOL 76 % IV SOLN
76 % | Freq: Once | INTRAVENOUS | Status: DC | PRN
Start: 2019-03-05 — End: 2019-03-05

## 2019-03-05 MED ORDER — LIDOCAINE VISCOUS HCL 2 % MT SOLN
2 % | Freq: Once | OROMUCOSAL | Status: AC
Start: 2019-03-05 — End: 2019-03-05
  Administered 2019-03-05: 04:00:00 via ORAL

## 2019-03-05 MED FILL — LORAZEPAM 1 MG PO TABS: 1 mg | ORAL | Qty: 1

## 2019-03-05 MED FILL — MAG-AL PLUS 200-200-20 MG/5ML PO LIQD: 200-200-20 MG/5ML | ORAL | Qty: 30

## 2019-03-05 NOTE — ED Notes (Signed)
Discharge instructions given, patient acknowledged understanding, rx given x1, patient ambulated out of ed upon discharge      Jasper Riling, RN  03/05/19 0101

## 2019-03-05 NOTE — ED Notes (Signed)
Pt resting at present. GI cocktail given po. No complaints voiced. VSS. Respirations even and unlabored. Call bell in reach. Dr. Glorianne Manchester speaking with pt and family.     Pamala Hurry, RN  03/05/19 [phone]

## 2019-03-09 LAB — CULTURE, BLOOD 2: Culture, Blood 2: NO GROWTH

## 2019-03-09 LAB — CULTURE, BLOOD 1: Blood Culture, Routine: NO GROWTH

## 2019-09-04 ENCOUNTER — Encounter

## 2019-09-22 ENCOUNTER — Inpatient Hospital Stay: Admit: 2019-09-22 | Payer: MEDICAID | Primary: Adult Health

## 2019-09-22 DIAGNOSIS — K219 Gastro-esophageal reflux disease without esophagitis: Secondary | ICD-10-CM

## 2019-10-10 ENCOUNTER — Encounter

## 2019-10-10 ENCOUNTER — Inpatient Hospital Stay: Admit: 2019-10-10 | Payer: MEDICAID | Primary: Adult Health

## 2019-10-10 ENCOUNTER — Inpatient Hospital Stay: Payer: MEDICAID | Primary: Adult Health

## 2019-10-10 DIAGNOSIS — M25561 Pain in right knee: Secondary | ICD-10-CM

## 2020-02-05 MED ORDER — bisacodyL (DULCOLAX) 5 mg EC tablet
5 | ORAL_TABLET | ORAL | 0 refills | Status: AC
Start: 2020-02-05 — End: ?

## 2020-02-05 MED ORDER — polyethylene glycol (GLYCOLAX) 17 gram/dose powder
17 | ORAL | 0 refills | 14.00000 days | Status: AC
Start: 2020-02-05 — End: ?

## 2020-02-05 NOTE — Unmapped (Signed)
Spoke with pt and son in law through nepali interpreter. Scheduled OA screening colonoscopy for Monday oct 11th @9am . Pt has been fully vaccinated for covid and is aware to bring proof of vaciination. Flowsheets filled out, letter is sent out, and medication orders are in the system.

## 2020-02-05 NOTE — Telephone Encounter (Signed)
Patient calling colo line requesting to schedule OA colonoscopy.

## 2020-03-22 ENCOUNTER — Encounter

## 2020-03-22 ENCOUNTER — Ambulatory Visit: Admit: 2020-03-22 | Payer: MEDICAID | Attending: Gastroenterology

## 2020-07-30 ENCOUNTER — Inpatient Hospital Stay
Admit: 2020-07-30 | Discharge: 2020-07-30 | Disposition: A | Discharge status: Home / Self Care | Payer: MEDICAID | Attending: Emergency Medicine

## 2020-07-30 ENCOUNTER — Emergency Department: Admit: 2020-07-30 | Payer: MEDICAID | Primary: Adult Health

## 2020-07-30 DIAGNOSIS — J45909 Unspecified asthma, uncomplicated: Secondary | ICD-10-CM

## 2020-07-30 LAB — CBC WITH AUTO DIFFERENTIAL
Basophils %: 0.9 %
Basophils Absolute: 0 10*3/uL (ref 0.0–0.2)
Eosinophils %: 1.3 %
Eosinophils Absolute: 0.1 10*3/uL (ref 0.0–0.6)
Hematocrit: 42.6 % (ref 40.5–52.5)
Hemoglobin: 14.4 g/dL (ref 13.5–17.5)
Lymphocytes %: 28.3 %
Lymphocytes Absolute: 1.3 10*3/uL (ref 1.0–5.1)
MCH: 30 pg (ref 26.0–34.0)
MCHC: 33.9 g/dL (ref 31.0–36.0)
MCV: 88.4 fL (ref 80.0–100.0)
MPV: 9.1 fL (ref 5.0–10.5)
Monocytes %: 7.4 %
Monocytes Absolute: 0.3 10*3/uL (ref 0.0–1.3)
Neutrophils %: 62.1 %
Neutrophils Absolute: 2.8 10*3/uL (ref 1.7–7.7)
Platelets: 195 10*3/uL (ref 135–450)
RBC: 4.82 M/uL (ref 4.20–5.90)
RDW: 13.2 % (ref 12.4–15.4)
WBC: 4.6 10*3/uL (ref 4.0–11.0)

## 2020-07-30 LAB — EKG 12-LEAD
Atrial Rate: 70 {beats}/min
P Axis: 60 degrees
P-R Interval: 204 ms
Q-T Interval: 376 ms
QRS Duration: 84 ms
QTc Calculation (Bazett): 406 ms
R Axis: -1 degrees
T Axis: 26 degrees
Ventricular Rate: 70 {beats}/min

## 2020-07-30 LAB — BASIC METABOLIC PANEL W/ REFLEX TO MG FOR LOW K
Anion Gap: 8 (ref 3–16)
BUN: 9 mg/dL (ref 7–20)
CO2: 25 mmol/L (ref 21–32)
Calcium: 8.9 mg/dL (ref 8.3–10.6)
Chloride: 105 mmol/L (ref 99–110)
Creatinine: 0.9 mg/dL (ref 0.8–1.3)
GFR African American: >60 (ref >60)
GFR Non-African American: >60 (ref >60)
Glucose: 128 mg/dL — ABNORMAL HIGH (ref 70–99)
Potassium reflex Magnesium: 4 mmol/L (ref 3.5–5.1)
Sodium: 138 mmol/L (ref 136–145)

## 2020-07-30 LAB — BLOOD GAS, VENOUS
Base Excess, Ven: 2 mmol/L (ref <=3.0)
Carboxyhemoglobin: 2.6 % — ABNORMAL HIGH (ref 0.0–1.5)
HCO3, Venous: 26.3 mmol/L (ref 23.0–29.0)
MetHgb, Ven: 0.7 % (ref <1.5)
O2 Content, Ven: 20 VOL %
O2 Sat, Ven: 98 %
TC02 (Calc), Ven: 62 mmol/L
pCO2, Ven: 39.2 mmHg — ABNORMAL LOW (ref 40.0–50.0)
pH, Ven: 7.435 (ref 7.350–7.450)
pO2, Ven: 104 mmHg — ABNORMAL HIGH (ref 25.0–40.0)

## 2020-07-30 LAB — BRAIN NATRIURETIC PEPTIDE: Pro-BNP: 75 pg/mL (ref 0–124)

## 2020-07-30 LAB — TROPONIN: Troponin: <0.01 ng/mL (ref <0.01)

## 2020-07-30 MED ORDER — GABAPENTIN 100 MG PO CAPS
100 MG | ORAL_CAPSULE | Freq: Two times a day (BID) | ORAL | 1 refills | Status: DC
Start: 2020-07-30 — End: 2020-08-30

## 2020-07-30 MED ORDER — ALBUTEROL SULFATE HFA 108 (90 BASE) MCG/ACT IN AERS
108 (90 Base) MCG/ACT | Freq: Four times a day (QID) | RESPIRATORY_TRACT | 1 refills | Status: AC | PRN
Start: 2020-07-30 — End: ?

## 2020-07-30 MED ORDER — PANTOPRAZOLE SODIUM 40 MG PO TBEC
40 MG | ORAL_TABLET | Freq: Every day | ORAL | 1 refills | Status: AC
Start: 2020-07-30 — End: ?

## 2020-07-30 NOTE — ED Provider Notes (Signed)
Emergency Department Encounter    Patient: Michael Knight  MRN: [id number]  DOB: 1956/03/21  Date of Evaluation: 07/30/2020  ED Provider:  Joslyn Hy, MD    Triage Chief Complaint:   Cough (pt is Nepali speaking, comes in with daughter stating that he has had a cough for over 3 months, worsening and develops chest and back pain with it. Pt reports SOB started yesterday)    HOPI:  Michael Knight is a 65 y.o. male that presents 49-month history of chest and back pain intermittent abdominal pain, cough over the last 3 months.  Fevers or rigors, no COVID-19 exposures, unclear if history of asthma or COPD.  No weight loss or night sweats.  No hemoptysis or productive sputum.  No weight loss or night sweats.  Positive burning chest pressure.  Afebrile.  No focal motor or sensory deficit.  No distress.  Duration of symptoms have been 3 to 4 months.  Denies any history of TB.    ROS - see HPI, below listed is current ROS at time of my eval:  General:  No fevers, no chills  Eyes:  No recent vison changes, no discharge  ENT:  No sore throat, no nasal congestion, no hearing changes  Cardiovascular:  + chest pain, no palpitations  Respiratory:  + shortness of breath, + cough, no wheezing  Gastrointestinal:  No pain, no nausea, no vomiting, no diarrhea  Musculoskeletal:  No muscle pain, no joint pain  Skin:  No rash, no pruritis  Neurologic:  No speech problems, no headache  Psychiatric:  No anxiety  Genitourinary:  No dysuria, no hematuria  Endocrine:  No unexpected weight gain, no unexpected weight loss  Extremities:  no edema, no pain      Past Medical History:   Diagnosis Date   ??? Asthma    ??? Back pain    ??? Dyslipidemia    ??? GERD (gastroesophageal reflux disease)    ??? Hypothyroidism      Past Surgical History:   Procedure Laterality Date   ??? UPPER GASTROINTESTINAL ENDOSCOPY N/A 02/08/2018    EGD BIOPSY performed by Kandice Robinsons, MD at [facility]     History reviewed. No pertinent family history.  Social History      Socioeconomic History   ??? Marital status: Unknown     Spouse name: Not on file   ??? Number of children: Not on file   ??? Years of education: Not on file   ??? Highest education level: Not on file   Occupational History   ??? Not on file   Tobacco Use   ??? Smoking status: Former Smoker   ??? Smokeless tobacco: Never Used   Vaping Use   ??? Vaping Use: Never used   Substance and Sexual Activity   ??? Alcohol use: Not Currently   ??? Drug use: Never   ??? Sexual activity: Not Currently   Other Topics Concern   ??? Not on file   Social History Narrative   ??? Not on file     Social Determinants of Health     Financial Resource Strain:    ??? Difficulty of Paying Living Expenses: Not on file   Food Insecurity:    ??? Worried About Running Out of Food in the Last Year: Not on file   ??? Ran Out of Food in the Last Year: Not on file   Transportation Needs:    ??? Lack of Transportation (Medical): Not on file   ???  Lack of Transportation (Non-Medical): Not on file   Physical Activity:    ??? Days of Exercise per Week: Not on file   ??? Minutes of Exercise per Session: Not on file   Stress:    ??? Feeling of Stress : Not on file   Social Connections:    ??? Frequency of Communication with Friends and Family: Not on file   ??? Frequency of Social Gatherings with Friends and Family: Not on file   ??? Attends Religious Services: Not on file   ??? Active Member of Clubs or Organizations: Not on file   ??? Attends Banker Meetings: Not on file   ??? Marital Status: Not on file   Intimate Partner Violence:    ??? Fear of Current or Ex-Partner: Not on file   ??? Emotionally Abused: Not on file   ??? Physically Abused: Not on file   ??? Sexually Abused: Not on file   Housing Stability:    ??? Unable to Pay for Housing in the Last Year: Not on file   ??? Number of Places Lived in the Last Year: Not on file   ??? Unstable Housing in the Last Year: Not on file     No current facility-administered medications for this encounter.     Current Outpatient Medications   Medication Sig  Dispense Refill   ??? gabapentin (NEURONTIN) 100 MG capsule Take 1 capsule by mouth 2 times daily for 180 days. Intended supply: 90 days 180 capsule 1   ??? albuterol sulfate HFA (VENTOLIN HFA) 108 (90 Base) MCG/ACT inhaler Inhale 2 puffs into the lungs 4 times daily as needed for Wheezing 54 g 1   ??? pantoprazole (PROTONIX) 40 MG tablet Take 1 tablet by mouth every morning (before breakfast) 90 tablet 1   ??? docusate sodium (COLACE) 100 MG capsule Take 1 capsule by mouth 2 times daily 30 capsule 0   ??? ondansetron (ZOFRAN ODT) 4 MG disintegrating tablet Take 1-2 tablets by mouth every 8 hours as needed for Nausea May substitute the non ODT tablets if not covered financially by the insurance plan. 12 tablet 0   ??? ondansetron (ZOFRAN) 4 MG tablet Take 1 tablet by mouth every 8 hours as needed for Nausea 20 tablet 0   ??? famotidine (PEPCID) 20 MG tablet Take 1 tablet by mouth 2 times daily 60 tablet 0   ??? hydrOXYzine (VISTARIL) 25 MG capsule Take 1 capsule by mouth 3 times daily as needed (anxiety and congestion) 20 capsule 0   ??? butalbital-APAP-caffeine (FIORICET) 50-300-40 MG CAPS per capsule Take 1 capsule by mouth every 6 hours as needed for Headaches 20 capsule 0   ??? pantoprazole (PROTONIX) 40 MG tablet Take 1 tablet by mouth 2 times daily (before meals) 60 tablet 2   ??? albuterol sulfate HFA 108 (90 Base) MCG/ACT inhaler Inhale 2 puffs into the lungs every 4 hours as needed      ??? levothyroxine (SYNTHROID) 50 MCG tablet Take 50 mcg by mouth Daily     ??? pravastatin (PRAVACHOL) 40 MG tablet Take 40 mg by mouth nightly        No Known Allergies    Nursing Notes Reviewed    Physical Exam:  Triage VS:    ED Triage Vitals [07/30/20 1211]   Enc Vitals Group      BP 130/80      Pulse 66      Resp 16      Temp 98.1 ??F (36.7 ??  C)      Temp Source Oral      SpO2 97 %      Weight 147 lb 14.4 oz (67.1 kg)      Height       Head Circumference       Peak Flow       Pain Score       Pain Loc       Pain Edu?       Excl. in GC?         My  pulse ox interpretation is - normal    General appearance:  No acute distress  Skin:  Warm. Dry. No pallor. No rash.    Eye:  Normal conjuctiva.  no Icterus.     Ears, nose, mouth and throat:  Oral mucosa moist   Heart:  Regular rate and rhythm, normal S1 & S2, no extra heart sounds, no murmurs.    Perfusion:  intact  Respiratory:  inspiratory and expiratory wheezes in all lung fields, decreased air entry throughout, mild tachypnea noted, no accessory muscle use  Abdominal:  Soft.  Nontender.  Non distended.     Extremity:  No edema or tenderness  Neurological:  Alert and oriented,  No focal neuro deficits.       I have reviewed and interpreted all of the currently available lab results from this visit (if applicable):  Results for orders placed or performed during the hospital encounter of 07/30/20   CBC with Auto Differential   Result Value Ref Range    WBC 4.6 4.0 - 11.0 K/uL    RBC 4.82 4.20 - 5.90 M/uL    Hemoglobin 14.4 13.5 - 17.5 g/dL    Hematocrit 16.1 09.6 - 52.5 %    MCV 88.4 80.0 - 100.0 fL    MCH 30.0 26.0 - 34.0 pg    MCHC 33.9 31.0 - 36.0 g/dL    RDW 04.5 40.9 - 81.1 %    Platelets 195 135 - 450 K/uL    MPV 9.1 5.0 - 10.5 fL    Neutrophils % 62.1 %    Lymphocytes % 28.3 %    Monocytes % 7.4 %    Eosinophils % 1.3 %    Basophils % 0.9 %    Neutrophils Absolute 2.8 1.7 - 7.7 K/uL    Lymphocytes Absolute 1.3 1.0 - 5.1 K/uL    Monocytes Absolute 0.3 0.0 - 1.3 K/uL    Eosinophils Absolute 0.1 0.0 - 0.6 K/uL    Basophils Absolute 0.0 0.0 - 0.2 K/uL   Basic Metabolic Panel w/ Reflex to MG   Result Value Ref Range    Sodium 138 136 - 145 mmol/L    Potassium reflex Magnesium 4.0 3.5 - 5.1 mmol/L    Chloride 105 99 - 110 mmol/L    CO2 25 21 - 32 mmol/L    Anion Gap 8 3 - 16    Glucose 128 (H) 70 - 99 mg/dL    BUN 9 7 - 20 mg/dL    CREATININE 0.9 0.8 - 1.3 mg/dL    GFR Non-African American >60 >60    GFR African American >60 >60    Calcium 8.9 8.3 - 10.6 mg/dL   Troponin   Result Value Ref Range    Troponin  <0.01 <0.01 ng/mL   Brain Natriuretic Peptide   Result Value Ref Range    Pro-BNP 75 0 - 124 pg/mL   Blood Gas, Venous   Result  Value Ref Range    pH, Ven 7.435 7.350 - 7.450    pCO2, Ven 39.2 (L) 40.0 - 50.0 mmHg    pO2, Ven 104.0 (H) 25.0 - 40.0 mmHg    HCO3, Venous 26.3 23.0 - 29.0 mmol/L    Base Excess, Ven 2.0 -3.0 - 3.0 mmol/L    O2 Sat, Ven 98 Not Established %    Carboxyhemoglobin 2.6 (H) 0.0 - 1.5 %    MetHgb, Ven 0.7 <1.5 %    TC02 (Calc), Ven 62 Not Established mmol/L    O2 Content, Ven 20 Not Established VOL %    O2 Therapy Unknown    EKG 12 Lead   Result Value Ref Range    Ventricular Rate 70 BPM    Atrial Rate 70 BPM    P-R Interval 204 ms    QRS Duration 84 ms    Q-T Interval 376 ms    QTc Calculation (Bazett) 406 ms    P Axis 60 degrees    R Axis -1 degrees    T Axis 26 degrees    Diagnosis       Normal sinus rhythmNonspecific T wave abnormalityAbnormal ECGConfirmed by SUNA MD, LESTER ([id number]) on 07/30/2020 12:32:29 PM      Radiographs (if obtained):  Radiologist's Report Reviewed:  No results found.  EKG (if obtained): (All EKG's are interpreted by myself in the absence of a cardiologist)  Sinus rhythm, normal axis, nonspecific ST segment changes    MDM:  Patient presenting to the emergency department with likely reactive airway disease exacerbation is acute on chronic reflux.  Patient be treated for acute on chronic cough beta agonist outpatient pulmonary function test, no evidence of ACS.  No evidence of pneumonia or decompensated congestive heart failure.  No pneumothorax.  Duration of symptoms have been 3 months he stable for outpatient management.  May require outpatient stress test if continued symptoms..  Patient's pulse ox is normal.  Based on clinical presentation history and physical exam I do not see any evidence to suggest pneumonia, pulmonary embolus, acute coronary syndrome or aortic dissection.  The patient did respond well to emergency department therapy, including aerosol treatments  and steroids.  I do believe it is reasonable to discharge the patient home at this time and treat on an outpatient basis.  The patient was instructed on use of albuterol, they will also be provided a short course of prednisone via prescription.  There is no evidence to suggest more malignant etiology for the patient's presentation at this time, these were discussed with the patient, the patient understands this and will follow up as an outpatient, they understand and agree with the plan, return warnings given.    Clinical Impression:  1. Cough      Disposition referral (if applicable):  Ulice BoldChristine L Colella, APRN - CNP  [address]  [id number] [facility]  [place] [id number]  [phone]          Orbie HurstGregory Colangelo, MD  [address], [address]  [place] [id number]  [phone]          Disposition medications (if applicable):  New Prescriptions    ALBUTEROL SULFATE HFA (VENTOLIN HFA) 108 (90 BASE) MCG/ACT INHALER    Inhale 2 puffs into the lungs 4 times daily as needed for Wheezing    GABAPENTIN (NEURONTIN) 100 MG CAPSULE    Take 1 capsule by mouth 2 times daily for 180 days. Intended supply: 90 days    PANTOPRAZOLE (PROTONIX) 40  MG TABLET    Take 1 tablet by mouth every morning (before breakfast)       Comment: Please note this report has been produced using speech recognition software and may contain errors related to that system including errors in grammar, punctuation, and spelling, as well as words and phrases that may be inappropriate.  Efforts were made to edit the dictations.      Joslyn Hy, MD  07/30/20 1322

## 2020-07-31 LAB — EKG 12-LEAD
Atrial Rate: 71 {beats}/min
P Axis: 59 degrees
P-R Interval: 198 ms
Q-T Interval: 414 ms
QRS Duration: 84 ms
QTc Calculation (Bazett): 449 ms
R Axis: -8 degrees
T Axis: 11 degrees
Ventricular Rate: 71 {beats}/min

## 2020-08-02 ENCOUNTER — Other Ambulatory Visit: Payer: Self-pay | Admitting: Internal Medicine

## 2020-08-03 LAB — COMPLETE METABOLIC PANEL WITH GFR
AG Ratio: 1.6 (calc) (ref 1.0–2.5)
ALT: 43 U/L (ref 9–46)
AST: 26 U/L (ref 10–35)
Albumin: 4.3 g/dL (ref 3.6–5.1)
Alkaline phosphatase (APISO): 89 U/L (ref 35–144)
BUN: 11 mg/dL (ref 7–25)
CO2: 25 mmol/L (ref 20–32)
Calcium: 9.3 mg/dL (ref 8.6–10.3)
Chloride: 101 mmol/L (ref 98–110)
Creat: 0.95 mg/dL (ref 0.70–1.25)
GFR, Est African American: 98 mL/min/{1.73_m2} (ref >=60)
GFR, Est Non African American: 84 mL/min/{1.73_m2} (ref >=60)
Globulin: 2.7 g/dL (calc) (ref 1.9–3.7)
Glucose, Bld: 334 mg/dL — ABNORMAL HIGH (ref 65–99)
Potassium: 4.1 mmol/L (ref 3.5–5.3)
Sodium: 135 mmol/L (ref 135–146)
Total Bilirubin: 0.9 mg/dL (ref 0.2–1.2)
Total Protein: 7 g/dL (ref 6.1–8.1)

## 2020-08-03 LAB — PSA: PSA: 1.68 ng/mL (ref <=4.0)

## 2020-08-03 LAB — TSH: TSH: 1.48 mIU/L (ref 0.40–4.50)

## 2020-08-03 LAB — CBC
HCT: 43.5 % (ref 38.5–50.0)
Hemoglobin: 15.2 g/dL (ref 13.2–17.1)
MCH: 31.5 pg (ref 27.0–33.0)
MCHC: 34.9 g/dL (ref 32.0–36.0)
MCV: 90.2 fL (ref 80.0–100.0)
MPV: 12.2 fL (ref 7.5–12.5)
Platelets: 141 10*3/uL (ref 140–400)
RBC: 4.82 10*6/uL (ref 4.20–5.80)
RDW: 11.7 % (ref 11.0–15.0)
WBC: 4.6 10*3/uL (ref 3.8–10.8)

## 2020-08-03 LAB — HEMOGLOBIN A1C W/OUT EAG: Hgb A1c MFr Bld: 11.4 % of total Hgb — ABNORMAL HIGH (ref <5.7)

## 2020-08-03 LAB — VITAMIN D 25 HYDROXY (VIT D DEFICIENCY, FRACTURES): Vit D, 25-Hydroxy: 15 ng/mL — ABNORMAL LOW (ref 30–100)

## 2020-08-30 ENCOUNTER — Ambulatory Visit: Admit: 2020-08-30 | Discharge: 2020-08-30 | Payer: MEDICAID | Attending: Internal Medicine | Primary: Adult Health

## 2020-08-30 DIAGNOSIS — R053 Chronic cough: Secondary | ICD-10-CM

## 2020-08-30 NOTE — Progress Notes (Signed)
Michael Knight    Date of Birth: [birth year]     Date of Service:  08/30/2020     Chief Complaint   Patient presents with   ??? New Patient     ref by er   ??? Cough     DRY         HPI patient has been accompanied by his daughter to over office today.  N[place]ali interpreter was used for the encounter.  Patient has history of chronic dry cough for at least 6 months.  This has been associated with dyspnea with exertion of much longer duration spanning for at least 3 years, treated for "asthma" with Advair discus 100 and albuterol inhaler.  Denies any chest pain or fevers.  He was seen in the ER in February for evaluation of cough, following which a referral has been made for further evaluation.    Originally from [place]-has been in [place] since 2012. ?  History of latent tuberculosis, patient is not sure about this.  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  No obvious contact history with TB.  Has good appetite and weight.  No hemoptysis.  Denies any fevers or night sweats.  States that he previously smoked, but quit over 40 years ago.  Has been on lisinopril for over a year for control of hypertension.  Has history of GERD on pantoprazole 40 mg daily in AM.    No Known Allergies  Outpatient Medications Marked as Taking for the 08/30/20 encounter (Office Visit) with Dulce Sellar, MD   Medication Sig Dispense Refill   ??? fluticasone-salmeterol (ADVAIR) 100-50 MCG/DOSE diskus inhaler Inhale 1 puff into the lungs every 12 hours     ??? benzonatate (TESSALON) 100 MG capsule Take 100 mg by mouth 3 times daily as needed for Cough     ??? cetirizine (ZYRTEC) 10 MG tablet Take 10 mg by mouth daily     ??? hydroCHLOROthiazide (MICROZIDE) 12.5 MG capsule Take 12.5 mg by mouth daily     ??? lisinopril (PRINIVIL;ZESTRIL) 20 MG tablet Take 20 mg by mouth daily     ??? SUMAtriptan (IMITREX) 50 MG tablet Take 50 mg by mouth once as needed for Migraine     ??? traZODone (DESYREL) 50 MG tablet Take 50 mg  by mouth nightly     ??? albuterol sulfate HFA (VENTOLIN HFA) 108 (90 Base) MCG/ACT inhaler Inhale 2 puffs into the lungs 4 times daily as needed for Wheezing 54 g 1   ??? pantoprazole (PROTONIX) 40 MG tablet Take 1 tablet by mouth every morning (before breakfast) 90 tablet 1   ??? docusate sodium (COLACE) 100 MG capsule Take 1 capsule by mouth 2 times daily 30 capsule 0   ??? ondansetron (ZOFRAN ODT) 4 MG disintegrating tablet Take 1-2 tablets by mouth every 8 hours as needed for Nausea May substitute the non ODT tablets if not covered financially by the insurance plan. 12 tablet 0   ??? ondansetron (ZOFRAN) 4 MG tablet Take 1 tablet by mouth every 8 hours as needed for Nausea 20 tablet 0   ??? hydrOXYzine (VISTARIL) 25 MG capsule Take 1 capsule by mouth 3 times daily as needed (anxiety and congestion) 20 capsule 0   ??? pantoprazole (PROTONIX) 40 MG tablet Take 1 tablet by mouth 2 times daily (before meals) 60 tablet 2   ??? levothyroxine (SYNTHROID) 50 MCG tablet Take 50 mcg by mouth Daily     ???  pravastatin (PRAVACHOL) 40 MG tablet Take 40 mg by mouth nightly          Immunization History   Administered Date(s) Administered   ??? COVID-19, Pfizer Purple top, DILUTE for use, 12+ yrs, 46mcg/0.3mL dose 08/25/2019, 09/15/2019, 06/09/2020       Past Medical History:   Diagnosis Date   ??? Asthma    ??? Back pain    ??? Dyslipidemia    ??? GERD (gastroesophageal reflux disease)    ??? Hypothyroidism      Past Surgical History:   Procedure Laterality Date   ??? UPPER GASTROINTESTINAL ENDOSCOPY N/A 02/08/2018    EGD BIOPSY performed by Kandice Robinsons, MD at [facility] ENDOSCOPY     History reviewed. No pertinent family history.    Review of Systems:  Review of Systems   Constitutional: Negative for activity change, appetite change, fatigue and fever.   HENT: Negative for congestion, ear discharge, ear pain, postnasal drip, rhinorrhea, sinus pressure, sneezing, sore throat, tinnitus and voice change.    Respiratory: Positive for cough and shortness of  breath. Negative for apnea, choking, chest tightness, wheezing and stridor.    Cardiovascular: Negative for chest pain, palpitations and leg swelling.   Gastrointestinal: Negative for abdominal distention, abdominal pain, anal bleeding, blood in stool, constipation and diarrhea.        Heartburn   Musculoskeletal: Negative for arthralgias, back pain and gait problem.   Skin: Negative for pallor and rash.   Allergic/Immunologic: Negative for environmental allergies.   Neurological: Negative for dizziness, tremors, seizures, syncope, speech difficulty, weakness, light-headedness, numbness and headaches.   Hematological: Negative for adenopathy. Does not bruise/bleed easily.   Psychiatric/Behavioral: Negative for sleep disturbance.       Vitals:    08/30/20 1041   BP: 110/70   Pulse: 63   SpO2: 96%   Weight: 149 lb (67.6 kg)   Height: 5\' 4"  (1.626 m)     No flowsheet data found.   Body mass index is 25.58 kg/m??.     Wt Readings from Last 3 Encounters:   08/30/20 149 lb (67.6 kg)   07/30/20 147 lb 14.4 oz (67.1 kg)   06/23/18 142 lb (64.4 kg)     BP Readings from Last 3 Encounters:   08/30/20 110/70   07/30/20 130/80   03/05/19 98/72         Physical Exam  Constitutional:       General: He is not in acute distress.     Appearance: He is well-developed. He is not ill-appearing, toxic-appearing or diaphoretic.   HENT:      Mouth/Throat:      Pharynx: No oropharyngeal exudate.   Cardiovascular:      Rate and Rhythm: Normal rate and regular rhythm.      Heart sounds: Normal heart sounds. No murmur heard.  No friction rub.   Pulmonary:      Effort: No respiratory distress.      Breath sounds: Normal breath sounds. No wheezing, rhonchi or rales.   Chest:      Chest wall: No tenderness.   Abdominal:      General: There is no distension.      Palpations: There is no mass.      Tenderness: There is no abdominal tenderness. There is no guarding or rebound.   Musculoskeletal:         General: No swelling, tenderness or deformity.    Skin:     Coloration: Skin is not  pale.      Findings: No erythema or rash.   Neurological:      Mental Status: He is alert and oriented to person, place, and time.      Cranial Nerves: No cranial nerve deficit.      Motor: No abnormal muscle tone.      Coordination: Coordination normal.      Deep Tendon Reflexes: Reflexes normal.             Health Maintenance   Topic Date Due   ??? Hepatitis C screen  Never done   ??? Depression Screen  Never done   ??? HIV screen  Never done   ??? DTaP/Tdap/Td vaccine (1 - Tdap) Never done   ??? Diabetes screen  Never done   ??? Colorectal Cancer Screen  Never done   ??? Shingles Vaccine (1 of 2) Never done   ??? Lipid screen  02/09/2019   ??? Flu vaccine (1) 02/11/2020   ??? Potassium monitoring  07/30/2021   ??? Creatinine monitoring  07/30/2021   ??? COVID-19 Vaccine  Completed   ??? Hepatitis A vaccine  Aged Out   ??? Hepatitis B vaccine  Aged Out   ??? Hib vaccine  Aged Out   ??? Meningococcal (ACWY) vaccine  Aged Out   ??? Pneumococcal 0-64 years Vaccine  Aged Out          Assessment/Plan:     Diagnosis Orders   1. Chronic cough  CT CHEST WO CONTRAST    Full PFT Study With Bronchodilator    Quantiferon, Incubated    COVID-19   2. Lung infiltrate on CT  CT CHEST WO CONTRAST    COVID-19      Chronic cough could be related to use of lisinopril, which should preferably be changed to ARB or other antihypertensive medication.  I have encouraged the patient to discuss this with his PCP.  Other possibility is GERD, cough is particularly worse at nighttime on further questioning.  I have recommended patient to use pantoprazole 40 mg at nighttime instead of in a.m.    Questionable history of latent TB.  Prior CT imaging from September 2020 revealed bilateral infiltrates/consolidation, no follow-up CT was performed.  We will perform QuantiFERON gold test and repeat CT chest without contrast in order to reexamine the infiltrates.  Personally, based on patient's symptoms, it is highly unlikely he has pulmonary  tuberculosis.    Patient is currently treated with Advair 100 and albuterol inhaler as needed for presumed asthma, perform complete PFT study to evaluate for obstructive airway disease.    Return in about 1 month (around 09/30/2020).

## 2020-09-09 ENCOUNTER — Encounter

## 2020-09-10 LAB — COVID-19: SARS-CoV-2: NOT DETECTED

## 2020-09-13 ENCOUNTER — Inpatient Hospital Stay: Admit: 2020-09-13 | Payer: MEDICAID | Primary: Adult Health

## 2020-09-13 DIAGNOSIS — I517 Cardiomegaly: Secondary | ICD-10-CM

## 2020-09-13 DIAGNOSIS — R053 Chronic cough: Secondary | ICD-10-CM

## 2020-09-13 LAB — FULL PFT STUDY WITHOUT BRONCHODILATOR
DLCO %Pred: 75 %
FEV1 %Pred-Pre: 87 %
FEV1/FVC: 110 %
TLC Pre %Pred: 80 %

## 2020-09-13 NOTE — Procedures (Signed)
Pulmonary Function Testing      Patient name:  Michael Knight     [facility] #:   [id number]   Date of test: 09/13/2020  Date of interpretation:   09/14/2020    Mr. Gevork Ayyad is a 65 y.o. year-old non smoker. The spirometry data were acceptable and reproducible.     Spirometry:  Flow volume loops were normal. The FEV-1/FVC ratio was normal. The  Prebronchodilator FEV-1 was 2.16 liters (87% of predicted), which was normal. The FVC was 2.63 liters (79% of predicted), which was normal. Response to inhaled bronchodilators (albuterol) was not significant.    Lung volumes:  Lung volumes were tested by plethysmography. The total lung capacity was 3.74 liters (80% of predicted), which was normal. The residual volume was 1.02 liters (54% of predicted), which was decreased. The ratio of residual volume to total lung capacity (RV/TLC) was 71, which was decreased. Specific airway resistance was decreased.    Diffusion capacity was found to be decreased.       Interpretation:  Mildly reduced diffusion capacity.  Normal spirometry and lung volumes.  Clinical correlation recommended.

## 2020-09-16 LAB — QUANTIFERON, INCUBATED
QuantiFERON Mitogen: 9.92 IU/mL
QuantiFERON Nil: 0.18 IU/mL
Quantiferon TB Minus NIL: POSITIVE — AB
Quantiferon TB1 Minus NIL: 1.54 IU/mL — ABNORMAL HIGH (ref 0.00–0.34)
Quantiferon TB2 Minus NIL: 2.29 IU/mL — ABNORMAL HIGH (ref 0.00–0.34)

## 2020-09-30 ENCOUNTER — Encounter: Attending: Internal Medicine | Primary: Adult Health

## 2020-10-04 ENCOUNTER — Emergency Department: Admit: 2020-10-04 | Payer: MEDICAID | Primary: Adult Health

## 2020-10-04 ENCOUNTER — Inpatient Hospital Stay
Admit: 2020-10-04 | Discharge: 2020-10-04 | Disposition: A | Discharge status: Home / Self Care | Payer: MEDICAID | Attending: Emergency Medicine

## 2020-10-04 DIAGNOSIS — G43019 Migraine without aura, intractable, without status migrainosus: Secondary | ICD-10-CM

## 2020-10-04 LAB — CBC WITH AUTO DIFFERENTIAL
Basophils %: 0.7 %
Basophils Absolute: 0.1 10*3/uL (ref 0.0–0.2)
Eosinophils %: 0.2 %
Eosinophils Absolute: 0 10*3/uL (ref 0.0–0.6)
Hematocrit: 42.1 % (ref 40.5–52.5)
Hemoglobin: 14.2 g/dL (ref 13.5–17.5)
Lymphocytes %: 9 %
Lymphocytes Absolute: 1.1 10*3/uL (ref 1.0–5.1)
MCH: 29.5 pg (ref 26.0–34.0)
MCHC: 33.7 g/dL (ref 31.0–36.0)
MCV: 87.6 fL (ref 80.0–100.0)
MPV: 7.7 fL (ref 5.0–10.5)
Monocytes %: 6.3 %
Monocytes Absolute: 0.8 10*3/uL (ref 0.0–1.3)
Neutrophils %: 83.8 %
Neutrophils Absolute: 10.2 10*3/uL — ABNORMAL HIGH (ref 1.7–7.7)
Platelets: 263 10*3/uL (ref 135–450)
RBC: 4.81 M/uL (ref 4.20–5.90)
RDW: 13.2 % (ref 12.4–15.4)
WBC: 12.1 10*3/uL — ABNORMAL HIGH (ref 4.0–11.0)

## 2020-10-04 LAB — COMPREHENSIVE METABOLIC PANEL W/ REFLEX TO MG FOR LOW K
ALT: 10 U/L (ref 10–40)
AST: 17 U/L (ref 15–37)
Albumin/Globulin Ratio: 1.6 (ref 1.1–2.2)
Albumin: 4.2 g/dL (ref 3.4–5.0)
Alkaline Phosphatase: 102 U/L (ref 40–129)
Anion Gap: 14 (ref 3–16)
BUN: 13 mg/dL (ref 7–20)
CO2: 20 mmol/L — ABNORMAL LOW (ref 21–32)
Calcium: 9.3 mg/dL (ref 8.3–10.6)
Chloride: 101 mmol/L (ref 99–110)
Creatinine: 0.9 mg/dL (ref 0.8–1.3)
GFR African American: >60 (ref >60)
GFR Non-African American: >60 (ref >60)
Glucose: 110 mg/dL — ABNORMAL HIGH (ref 70–99)
Potassium reflex Magnesium: 4.2 mmol/L (ref 3.5–5.1)
Sodium: 135 mmol/L — ABNORMAL LOW (ref 136–145)
Total Bilirubin: 1.4 mg/dL — ABNORMAL HIGH (ref 0.0–1.0)
Total Protein: 6.8 g/dL (ref 6.4–8.2)

## 2020-10-04 LAB — SEDIMENTATION RATE: Sed Rate: 44 mm/Hr — ABNORMAL HIGH (ref 0–20)

## 2020-10-04 LAB — C-REACTIVE PROTEIN: CRP: 82.2 mg/L — ABNORMAL HIGH (ref 0.0–5.1)

## 2020-10-04 MED ORDER — SODIUM CHLORIDE 0.9 % IV BOLUS
0.9 | Freq: Once | INTRAVENOUS | Status: AC
Start: 2020-10-04 — End: 2020-10-04
  Administered 2020-10-04: 22:00:00 via INTRAVENOUS

## 2020-10-04 MED ORDER — KETOROLAC TROMETHAMINE 30 MG/ML IJ SOLN
30 MG/ML | Freq: Once | INTRAMUSCULAR | Status: AC
Start: 2020-10-04 — End: 2020-10-04
  Administered 2020-10-04: 22:00:00 via INTRAVENOUS

## 2020-10-04 MED ORDER — AMOXICILLIN-POT CLAVULANATE 875-125 MG PO TABS
875-125 MG | Freq: Once | ORAL | Status: AC
Start: 2020-10-04 — End: 2020-10-04
  Administered 2020-10-04: 23:00:00 via ORAL

## 2020-10-04 MED ORDER — PROCHLORPERAZINE EDISYLATE 10 MG/2ML IJ SOLN
10 MG/2ML | Freq: Once | INTRAMUSCULAR | Status: AC
Start: 2020-10-04 — End: 2020-10-04
  Administered 2020-10-04: 22:00:00 via INTRAVENOUS

## 2020-10-04 MED ORDER — AMOXICILLIN-POT CLAVULANATE 875-125 MG PO TABS
875-125 MG | ORAL_TABLET | Freq: Two times a day (BID) | ORAL | 0 refills | Status: AC
Start: 2020-10-04 — End: 2020-10-11

## 2020-10-04 MED ORDER — DIPHENHYDRAMINE HCL 50 MG/ML IJ SOLN
50 MG/ML | Freq: Once | INTRAMUSCULAR | Status: AC
Start: 2020-10-04 — End: 2020-10-04
  Administered 2020-10-04: 22:00:00 via INTRAVENOUS

## 2020-10-04 MED FILL — KETOROLAC TROMETHAMINE 30 MG/ML IJ SOLN: 30 mg/mL | INTRAMUSCULAR | Qty: 1

## 2020-10-04 MED FILL — DIPHENHYDRAMINE HCL 50 MG/ML IJ SOLN: 50 mg/mL | INTRAMUSCULAR | Qty: 1

## 2020-10-04 MED FILL — PROCHLORPERAZINE EDISYLATE 10 MG/2ML IJ SOLN: 10 MG/2ML | INTRAMUSCULAR | Qty: 2

## 2020-10-04 MED FILL — AMOXICILLIN-POT CLAVULANATE 875-125 MG PO TABS: 875-125 mg | ORAL | Qty: 1

## 2020-10-04 NOTE — ED Notes (Signed)
RN called patient's family member who states that she will be en route to pick up patient.     Rosetta Posner, RN  10/04/20 1911

## 2020-10-04 NOTE — ED Provider Notes (Signed)
Primary Care Physician: Ulice Bold, APRN - CNP   Attending Physician: No att. providers found     History   Chief Complaint   Patient presents with   ??? Migraine     Pt states he is having a migraine that started 3 days ago, hx of migraines, denies other symptoms        HPI   Michael Knight  is a 65 y.o. male history of migraines who presents complaining of significant headaches which has been going on for a few days.  Patient stated that he did see his primary care doctor and was prescribed medicines.  However the medicines was Lipitor for cholesterol.  Stated the pain is around the frontal head and does not radiate.  No nausea vomiting fevers or neck rigidity.  No chest pain or shortness of breath.    Past Medical History:   Diagnosis Date   ??? Asthma    ??? Back pain    ??? Dyslipidemia    ??? GERD (gastroesophageal reflux disease)    ??? Hypothyroidism         Past Surgical History:   Procedure Laterality Date   ??? UPPER GASTROINTESTINAL ENDOSCOPY N/A 02/08/2018    EGD BIOPSY performed by Kandice Robinsons, MD at [facility] ENDOSCOPY        History reviewed. No pertinent family history.     Social History     Socioeconomic History   ??? Marital status: Unknown     Spouse name: None   ??? Number of children: None   ??? Years of education: None   ??? Highest education level: None   Occupational History   ??? None   Tobacco Use   ??? Smoking status: Former Smoker   ??? Smokeless tobacco: Never Used   Vaping Use   ??? Vaping Use: Never used   Substance and Sexual Activity   ??? Alcohol use: Not Currently   ??? Drug use: Never   ??? Sexual activity: Not Currently   Other Topics Concern   ??? None   Social History Narrative   ??? None     Social Determinants of Health     Financial Resource Strain:    ??? Difficulty of Paying Living Expenses: Not on file   Food Insecurity:    ??? Worried About Programme researcher, broadcasting/film/video in the Last Year: Not on file   ??? Ran Out of Food in the Last Year: Not on file   Transportation Needs:    ??? Lack of Transportation (Medical): Not  on file   ??? Lack of Transportation (Non-Medical): Not on file   Physical Activity:    ??? Days of Exercise per Week: Not on file   ??? Minutes of Exercise per Session: Not on file   Stress:    ??? Feeling of Stress : Not on file   Social Connections:    ??? Frequency of Communication with Friends and Family: Not on file   ??? Frequency of Social Gatherings with Friends and Family: Not on file   ??? Attends Religious Services: Not on file   ??? Active Member of Clubs or Organizations: Not on file   ??? Attends Banker Meetings: Not on file   ??? Marital Status: Not on file   Intimate Partner Violence:    ??? Fear of Current or Ex-Partner: Not on file   ??? Emotionally Abused: Not on file   ??? Physically Abused: Not on file   ??? Sexually  Abused: Not on file   Housing Stability:    ??? Unable to Pay for Housing in the Last Year: Not on file   ??? Number of Places Lived in the Last Year: Not on file   ??? Unstable Housing in the Last Year: Not on file        Review of Systems   10 total systems reviewed and found to be negative unless otherwise noted in HPI     Physical Exam   BP 115/84    Pulse 97    Temp 98 ??F (36.7 ??C) (Oral)    Resp 18    Ht 5\' 2"  (1.575 m)    Wt 147 lb (66.7 kg)    SpO2 94%    BMI 26.89 kg/m??      CONSTITUTIONAL: Well appearing, in acute distress   HEAD: atraumatic, normocephalic   EYES: PERRL, No injection, discharge or scleral icterus.   ENT: Moist mucous membranes.    NECK: Normal ROM, NO LAD   CARDIOVASCULAR: Regular rate and rhythm. No murmurs or gallop.   PULMONARY/CHEST: Airway patent. No retractions. Breath sounds clear with good air entry bilaterally.   ABDOMEN: Soft, Non-distended and non-tender, without guarding or rebound.   SKIN: Acyanotic, warm, dry   MUSCULOSKELETAL: No swelling, tenderness or deformity   NEUROLOGICAL: Awake and oriented x 3. Pulses intact. Grossly nonfocal   Nursing note and vitals reviewed.     ED Course & Medical Decision Making   Medications   0.9 % sodium chloride bolus (0 mLs  IntraVENous Stopped 10/04/20 1911)   prochlorperazine (COMPAZINE) injection 10 mg (10 mg IntraVENous Given 10/04/20 1827)   diphenhydrAMINE (BENADRYL) injection 25 mg (25 mg IntraVENous Given 10/04/20 1825)   ketorolac (TORADOL) injection 15 mg (15 mg IntraVENous Given 10/04/20 1826)   amoxicillin-clavulanate (AUGMENTIN) 875-125 MG per tablet 1 tablet (1 tablet Oral Given 10/04/20 1915)      Labs Reviewed   CBC WITH AUTO DIFFERENTIAL - Abnormal; Notable for the following components:       Result Value    WBC 12.1 (*)     Neutrophils Absolute 10.2 (*)     All other components within normal limits   COMPREHENSIVE METABOLIC PANEL W/ REFLEX TO MG FOR LOW K - Abnormal; Notable for the following components:    Sodium 135 (*)     CO2 20 (*)     Glucose 110 (*)     Total Bilirubin 1.4 (*)     All other components within normal limits   SEDIMENTATION RATE - Abnormal; Notable for the following components:    Sed Rate 44 (*)     All other components within normal limits   C-REACTIVE PROTEIN - Abnormal; Notable for the following components:    CRP 82.2 (*)     All other components within normal limits      CT Head WO Contrast   Final Result   No acute intracranial abnormality.      Total opacification of the visualized portion of the left maxillary sinus   extending into the left nasal cavity and with partial opacification of the   left ethmoid air cells.  Correlation for signs of infection suggested            CT Head WO Contrast    Result Date: 10/04/2020  EXAMINATION: CT OF THE HEAD WITHOUT CONTRAST  10/04/2020 5:25 pm TECHNIQUE: CT of the head was performed without the administration of intravenous contrast. Dose modulation, iterative reconstruction,  and/or weight based adjustment of the mA/kV was utilized to reduce the radiation dose to as low as reasonably achievable. COMPARISON: 03/04/2019 HISTORY: ORDERING SYSTEM PROVIDED HISTORY: headaches TECHNOLOGIST PROVIDED HISTORY: Reason for exam:->headaches Has a "code stroke" or  "stroke alert" been called?->No Decision Support Exception - unselect if not a suspected or confirmed emergency medical condition->Emergency Medical Condition (MA) Reason for Exam: headaches FINDINGS: BRAIN/VENTRICLES: There is no acute intracranial hemorrhage, mass effect or midline shift.  No abnormal extra-axial fluid collection.  The gray-white differentiation is maintained without evidence of an acute infarct.  There is no evidence of hydrocephalus. ORBITS: The visualized portion of the orbits demonstrate no acute abnormality. SINUSES: Opacification of the left maxillary sinus extending into the left nasal cavity with partial opacification the left ethmoid air cells. SOFT TISSUES/SKULL:  No acute abnormality of the visualized skull or soft tissues.     No acute intracranial abnormality. Total opacification of the visualized portion of the left maxillary sinus extending into the left nasal cavity and with partial opacification of the left ethmoid air cells.  Correlation for signs of infection suggested     CT CHEST WO CONTRAST    Result Date: 09/13/2020  EXAMINATION: CT OF THE CHEST WITHOUT CONTRAST 09/13/2020 2:25 pm TECHNIQUE: CT of the chest was performed without the administration of intravenous contrast. Multiplanar reformatted images are provided for review. Dose modulation, iterative reconstruction, and/or weight based adjustment of the mA/kV was utilized to reduce the radiation dose to as low as reasonably achievable. COMPARISON: 03/04/2019 HISTORY: ORDERING SYSTEM PROVIDED HISTORY: Chronic cough TECHNOLOGIST PROVIDED HISTORY: Reason for exam:->chronic cough with history of lung infiltrate, persistent symptoms despite inhalers. Reason for Exam: chronic cough with history of lung infiltrate, persistent symptoms despite inhalers. FINDINGS: Mediastinum: Mild-to-moderate cardiomegaly.  No pericardial effusion.  1 cm size lymph nodes identified in the mediastinum, similar to prior study. Normal size thoracic  aorta. Lungs/pleura: 2.3 cm oval infiltrate/atelectasis identified in the left lung base laterally involving the left lingular segment.  This appearance is similar to prior study. Mild interstitial congestion noted in the lower lungs.  No pleural effusions. Upper Abdomen: No acute finding noted in the upper abdomen. Soft Tissues/Bones: Mild osteopenic changes and degenerative changes identified in the bony structures..     Mild-to-moderate cardiomegaly with mild basilar congestion. 2.3 cm oval infiltrate/atelectasis in the left lung base involving the left lingular segment, similar to 2020 comparison. No thoracic adenopathy.       PROCEDURES:   Procedures    ASSESSMENT AND PLAN:  [id number] DOBOct 03, 1957, Giann Obara is a 65 y.o. male who presents complaining of mild headache.  On exam patient appeared to be in distress but nontoxic.  He was neurologically intact.  Because of the severity of the headache I did obtain a CT scan of the head which was negative.  He was treated for migraines with significant improvement.  Believe that his headaches are secondary to migraine.  Patient is stable discharged home.    ClINICAL IMPRESSION:  1. Intractable migraine without aura and without status migrainosus    2. Acute non-recurrent maxillary sinusitis          PATIENT REFERRED TO:  Ulice Bold, APRN - CNP  [address]  [id number] [facility]  [place] [address]  [phone]    Schedule an appointment as soon as possible for a visit in 2 days        DISCHARGE MEDICATIONS:  Discharge Medication List as of 10/04/2020  7:11 PM  START taking these medications    Details   amoxicillin-clavulanate (AUGMENTIN) 875-125 MG per tablet Take 1 tablet by mouth 2 times daily for 7 days, Disp-14 tablet, R-0Print           DISCONTINUED MEDICATIONS:  Discharge Medication List as of 10/04/2020  7:11 PM        DISPOSITION Decision To Discharge 10/04/2020 07:07:56 PM  -We have instructed the patient, (Yeng Micco) to return to the ED or  call his PCP if his pain/symptoms worsen. -Findings and recommendations explained to patient. He expressed understanding and agreed with the plan.    ___________________________________________________________________________________  _________________________________________________________________________________________  This record is transcribed utilizing voice recognition technology. There are inherent limitations in this technology. In addition, there may be limitations in editing of this report. If there are any discrepancies, please contact me directly.        Sheelah Ritacco A Caron Tardif, MD  10/08/20 1152

## 2020-10-04 NOTE — ED Triage Notes (Signed)
Pt states he is having a migraine that started 3 days ago, hx of migraines, denies other symptoms

## 2020-10-21 ENCOUNTER — Ambulatory Visit: Admit: 2020-10-21 | Discharge: 2020-10-21 | Payer: MEDICAID | Attending: Internal Medicine | Primary: Adult Health

## 2020-10-21 DIAGNOSIS — R053 Chronic cough: Secondary | ICD-10-CM

## 2020-10-21 NOTE — Progress Notes (Signed)
Michael Knight    Date of Birth: 1956/05/24     Date of Service:  10/21/2020     Chief Complaint   Patient presents with   ??? 1 Month Follow-Up   ??? Results     ct & pft         HPI Nepali interpreter was used for the encounter.  Patient has been accompanied by his daughter to our office today.  Still continues to have dyspnea, cough with mucoid phlegm-recently noticed fevers on occasions particularly at nighttime.  Has good appetite and weight.  Denies chest pain or hemoptysis.    No Known Allergies  Outpatient Medications Marked as Taking for the 10/21/20 encounter (Office Visit) with Dulce Sellar, MD   Medication Sig Dispense Refill   ??? fluticasone-salmeterol (ADVAIR) 100-50 MCG/DOSE diskus inhaler Inhale 1 puff into the lungs every 12 hours     ??? benzonatate (TESSALON) 100 MG capsule Take 100 mg by mouth 3 times daily as needed for Cough     ??? cetirizine (ZYRTEC) 10 MG tablet Take 10 mg by mouth daily     ??? hydroCHLOROthiazide (MICROZIDE) 12.5 MG capsule Take 12.5 mg by mouth daily     ??? lisinopril (PRINIVIL;ZESTRIL) 20 MG tablet Take 20 mg by mouth daily     ??? SUMAtriptan (IMITREX) 50 MG tablet Take 50 mg by mouth once as needed for Migraine     ??? traZODone (DESYREL) 50 MG tablet Take 50 mg by mouth nightly     ??? albuterol sulfate HFA (VENTOLIN HFA) 108 (90 Base) MCG/ACT inhaler Inhale 2 puffs into the lungs 4 times daily as needed for Wheezing 54 g 1   ??? pantoprazole (PROTONIX) 40 MG tablet Take 1 tablet by mouth every morning (before breakfast) 90 tablet 1   ??? docusate sodium (COLACE) 100 MG capsule Take 1 capsule by mouth 2 times daily 30 capsule 0   ??? ondansetron (ZOFRAN ODT) 4 MG disintegrating tablet Take 1-2 tablets by mouth every 8 hours as needed for Nausea May substitute the non ODT tablets if not covered financially by the insurance plan. 12 tablet 0   ??? ondansetron (ZOFRAN) 4 MG tablet Take 1 tablet by mouth every 8 hours as needed for Nausea 20 tablet 0   ??? hydrOXYzine (VISTARIL) 25 MG capsule  Take 1 capsule by mouth 3 times daily as needed (anxiety and congestion) 20 capsule 0   ??? pantoprazole (PROTONIX) 40 MG tablet Take 1 tablet by mouth 2 times daily (before meals) 60 tablet 2   ??? levothyroxine (SYNTHROID) 50 MCG tablet Take 50 mcg by mouth Daily     ??? pravastatin (PRAVACHOL) 40 MG tablet Take 40 mg by mouth nightly          Immunization History   Administered Date(s) Administered   ??? COVID-19, Pfizer Purple top, DILUTE for use, 12+ yrs, 84mcg/0.3mL dose 08/25/2019, 09/15/2019, 06/09/2020   ??? Influenza, Quadv, Recombinant, IM PF (Flublok 18 yrs and older) 05/09/2019   ??? Influenza, Triv, inactivated, subunit, adjuvanted, IM (Fluad 65 yrs and older) 04/13/2016       Past Medical History:   Diagnosis Date   ??? Asthma    ??? Back pain    ??? Dyslipidemia    ??? GERD (gastroesophageal reflux disease)    ??? Hypothyroidism      Past Surgical History:   Procedure Laterality Date   ??? UPPER GASTROINTESTINAL ENDOSCOPY N/A 02/08/2018    EGD BIOPSY performed by Kandice Robinsons, MD  at [facility] END[facility]     History reviewed. No pertinent family history.    Review of Systems:  Review of Systems   Constitutional: Positive for fatigue and fever. Negative for activity change and appetite change.   HENT: Negative for congestion, ear discharge, ear pain, postnasal drip, rhinorrhea, sinus pressure, sneezing, sore throat, tinnitus and voice change.    Respiratory: Positive for cough and shortness of breath. Negative for apnea, choking, chest tightness, wheezing and stridor.    Cardiovascular: Negative for chest pain, palpitations and leg swelling.   Gastrointestinal: Negative for abdominal distention, abdominal pain, anal bleeding, blood in stool, constipation and diarrhea.   Musculoskeletal: Negative for arthralgias, back pain and gait problem.   Skin: Negative for pallor and rash.   Allergic/Immunologic: Negative for environmental allergies.   Neurological: Negative for dizziness, tremors, seizures, syncope, speech difficulty,  weakness, light-headedness, numbness and headaches.   Hematological: Negative for adenopathy. Does not bruise/bleed easily.   Psychiatric/Behavioral: Negative for sleep disturbance.       Vitals:    10/21/20 1437 10/21/20 1441   BP: (!) 148/70 (!) 148/70   Pulse: 88    SpO2: 97%    Weight: 146 lb (66.2 kg)    Height: 5\' 2"  (1.575 m)      No flowsheet data found.   Body mass index is 26.7 kg/m??.     Wt Readings from Last 3 Encounters:   10/21/20 146 lb (66.2 kg)   10/04/20 147 lb (66.7 kg)   08/30/20 149 lb (67.6 kg)     BP Readings from Last 3 Encounters:   10/21/20 (!) 148/70   10/04/20 115/84   08/30/20 110/70         Physical Exam        Health Maintenance   Topic Date Due   ??? Depression Screen  Never done   ??? HIV screen  Never done   ??? Hepatitis C screen  Never done   ??? DTaP/Tdap/Td vaccine (1 - Tdap) Never done   ??? Diabetes screen  Never done   ??? Colorectal Cancer Screen  Never done   ??? Shingles vaccine (1 of 2) Never done   ??? Lipids  02/09/2019   ??? Flu vaccine (Season Ended) 02/10/2021   ??? COVID-19 Vaccine  Completed   ??? Hepatitis A vaccine  Aged Out   ??? Hepatitis B vaccine  Aged Out   ??? Hib vaccine  Aged Out   ??? Meningococcal (ACWY) vaccine  Aged Out   ??? Pneumococcal 0-64 years Vaccine  Aged Out          Assessment/Plan:    Continues to have cough, but now has more shortness of breath and occasional fevers.  Had previously discussed about switching lisinopril over to ARB, which has not happened.    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  In view of patient's symptoms, we will perform bronchoscopy in order to rule out acute/chronic infections including TB.  This will be organized for shortly, procedure was described to the patient by utilizing the interpreter.    PFT from 4/4 revealed no significant obstruction or restriction.    Return in about 2 weeks (around 11/04/2020).

## 2020-10-22 NOTE — Progress Notes (Addendum)
Patient reached _X___ yes--spoke with pt's daughter, Nilsa Nutting, using [employer] Nepali interpreter [phone].        Date _5/19/22________  Time   0730_______  Arrival _0600  hosp-endo_____    Nothing to eat or drink after midnight-follow your doctors prep instructions-this may include taking a second dose of your prep after midnight  Responsible adult 36 or older to stay on site while you are here-drive you home-stay with you after  Follow any instructions your doctors office has given you  Bring a complete list of all your medications and supplements including name,dose,how often taken the day of your procedure  If you normally take the following medications in the morning please do so the AM of your procedure with a small sip of water       Heart,blood pressure,seizure,thyroid or breathing medications-use your inhalers       DO NOT take blood pressure medications ending in "artan" or "pril" the AM of procedure or evening prior-DO NOT TAKE LISINOPRIL  Take half or your normal dose of any long acting insulins the night before your procedure-do not take any diabetic medications the AM of procedure  Follow your doctors instructions regarding stopping or taking  any blood thinners-if you do not have instructions-call them  Any questions call your doctor  Other ______take levothyroxine, inhaler am of procedure________________________________________________________      Nepali interpreter arranged with [employer] to meet pt.masc lobby [phone].  Job # [id number].      VISITOR POLICY(subject to change)             The current policy is 2 visitors per patient.There are no children allowed.Everyone must mask.Visiting hours are 8a-8p.Overnight visitors will be at the discretion of the nurse.

## 2020-10-28 ENCOUNTER — Ambulatory Visit: Admit: 2020-10-28 | Primary: Adult Health

## 2020-10-28 ENCOUNTER — Inpatient Hospital Stay: Payer: MEDICAID

## 2020-10-28 LAB — CBC
Hematocrit: 44.4 % (ref 40.5–52.5)
Hemoglobin: 14.7 g/dL (ref 13.5–17.5)
MCH: 28.9 pg (ref 26.0–34.0)
MCHC: 33.1 g/dL (ref 31.0–36.0)
MCV: 87.1 fL (ref 80.0–100.0)
MPV: 7.3 fL (ref 5.0–10.5)
Platelets: 274 10*3/uL (ref 135–450)
RBC: 5.1 M/uL (ref 4.20–5.90)
RDW: 13.3 % (ref 12.4–15.4)
WBC: 3.9 10*3/uL — ABNORMAL LOW (ref 4.0–11.0)

## 2020-10-28 LAB — PROTIME-INR
INR: 1.03 (ref 0.88–1.12)
Protime: 11.6 s (ref 9.9–12.7)

## 2020-10-28 LAB — CELL COUNT WITH DIFFERENTIAL, BAL FLUID
Eosin: 2 %
Lymphocytes, BAL: 55 % — ABNORMAL HIGH (ref 5–10)
Macrophages, BAL: 32 % — ABNORMAL LOW (ref 90–95)
Monocytes, BAL: 8 %
Number of Cells Counted BAL (Lavage): 100
RBC, BAL: 1700 /mm3
Segmented Neutrophils, BAL: 3 % — ABNORMAL LOW (ref 5–10)
WBC/EPI Cells Bal: 167 /mm3

## 2020-10-28 MED ORDER — PHENYLEPHRINE HCL 10 MG/ML SOLN (MIXTURES ONLY)
10 MG/ML | Status: DC | PRN
Start: 2020-10-28 — End: 2020-10-28
  Administered 2020-10-28 (×2): 100 via INTRAVENOUS
  Administered 2020-10-28: 11:00:00 50 via INTRAVENOUS
  Administered 2020-10-28: 12:00:00 100 via INTRAVENOUS

## 2020-10-28 MED ORDER — LIDOCAINE HCL 1 % IJ SOLN
1 % | INTRAMUSCULAR | Status: DC | PRN
Start: 2020-10-28 — End: 2020-10-28
  Administered 2020-10-28: 12:00:00 60 via INTRAVENOUS

## 2020-10-28 MED ORDER — PROPOFOL 200 MG/20ML IV EMUL
20020 MG/20ML | INTRAVENOUS | Status: DC | PRN
Start: 2020-10-28 — End: 2020-10-28
  Administered 2020-10-28: 12:00:00 via INTRAVENOUS

## 2020-10-28 MED ORDER — GLYCOPYRROLATE 0.2 MG/ML IJ SOLN
0.2 MG/ML | INTRAMUSCULAR | Status: DC | PRN
Start: 2020-10-28 — End: 2020-10-28
  Administered 2020-10-28: 11:00:00 .2 via INTRAVENOUS

## 2020-10-28 MED ORDER — LACTATED RINGERS IV SOLN
INTRAVENOUS | Status: DC
Start: 2020-10-28 — End: 2020-10-28
  Administered 2020-10-28: 11:00:00 via INTRAVENOUS

## 2020-10-28 MED ORDER — LIDOCAINE HCL 4 % EX SOLN
4 % | CUTANEOUS | Status: DC | PRN
Start: 2020-10-28 — End: 2020-10-28
  Administered 2020-10-28: 12:00:00 4 via ENDOTRACHEOPULMONARY
  Administered 2020-10-28: 12:00:00 8 via ENDOTRACHEOPULMONARY

## 2020-10-28 MED ORDER — GLYCOPYRROLATE 1 MG/5ML IV SOSY
1 | INTRAVENOUS | Status: AC
Start: 2020-10-28 — End: 2020-10-28

## 2020-10-28 MED ORDER — LIDOCAINE HCL (PF) 2 % IJ SOLN
2 | INTRAMUSCULAR | Status: AC
Start: 2020-10-28 — End: 2020-10-28

## 2020-10-28 MED ORDER — PROPOFOL 200 MG/20ML IV EMUL
200 | INTRAVENOUS | Status: AC
Start: 2020-10-28 — End: 2020-10-28

## 2020-10-28 MED ORDER — LIDOCAINE HCL 4 % EX SOLN
4 | CUTANEOUS | Status: AC
Start: 2020-10-28 — End: 2020-10-28

## 2020-10-28 MED ORDER — PROPOFOL 200 MG/20ML IV EMUL
200 MG/20ML | INTRAVENOUS | Status: DC | PRN
Start: 2020-10-28 — End: 2020-10-28
  Administered 2020-10-28: 12:00:00 20 via INTRAVENOUS
  Administered 2020-10-28 (×2): 40 via INTRAVENOUS

## 2020-10-28 MED ORDER — PHENYLEPHRINE HCL 1 MG/10ML IV SOSY
1 | INTRAVENOUS | Status: AC
Start: 2020-10-28 — End: 2020-10-28

## 2020-10-28 MED FILL — PHENYLEPHRINE HCL (PRESSORS) 1 MG/10ML IV SOSY: 1 MG/0ML | INTRAVENOUS | Qty: 10

## 2020-10-28 MED FILL — DIPRIVAN 200 MG/20ML IV EMUL: 200 MG/20ML | INTRAVENOUS | Qty: 40

## 2020-10-28 MED FILL — LIDOCAINE HCL 4 % EX SOLN: 4 % | CUTANEOUS | Qty: 50

## 2020-10-28 MED FILL — GLYCOPYRROLATE 1 MG/5ML IV SOSY: 1 MG/5ML | INTRAVENOUS | Qty: 5

## 2020-10-28 MED FILL — XYLOCAINE-MPF 2 % IJ SOLN: 2 % | INTRAMUSCULAR | Qty: 5

## 2020-10-28 NOTE — Progress Notes (Signed)
Pt s/p Broch procedure. Received Lido @ [id number]. Pt provided ice chips at this time. No s/s of cough or aspiration. Will continue to monitor.

## 2020-10-28 NOTE — Progress Notes (Signed)
Discharge instructions review with patient. All home medications have been reviewed, pt v/u. Discharge instructions signed. Pt discharged via wheelchair. Pt discharged with all belongings. Daughter  - Garner [facility] taking stable pt home.

## 2020-10-28 NOTE — Anesthesia Pre-Procedure Evaluation (Signed)
Department of Anesthesiology  Preprocedure Note       Name:  Benjamim Harnish   Age:  65 y.o.  DOB:  1956/05/22                                          MRN:  [id number]         Date:  10/28/2020      Surgeon: Juliann Mule):  Shireen Quan, MD    Procedure: BRONCHOSCOPY DIAGNOSTIC OR CELL [place] ONLY (N/A )    Medications prior to admission:   Prior to Admission medications    Medication Sig Start Date End Date Taking? Authorizing Provider   acetaminophen (TYLENOL) 500 MG tablet Take 500 mg by mouth every 6 hours as needed for Pain    Historical Provider, MD   amLODIPine (NORVASC) 10 MG tablet Take 10 mg by mouth daily    Historical Provider, MD   hydrOXYzine (ATARAX) 10 MG tablet Take 10 mg by mouth 3 times daily as needed for Itching    Historical Provider, MD   polyethylene glycol (GLYCOLAX) 17 g packet Take 17 g by mouth daily as needed for Constipation    Historical Provider, MD   montelukast (SINGULAIR) 10 MG tablet Take 10 mg by mouth nightly    Historical Provider, MD   promethazine-codeine (PHENERGAN WITH CODEINE) 6.25-10 MG/5ML syrup Take 5 mLs by mouth every 12 hours as needed for Cough.    Historical Provider, MD   GABAPENTIN PO Take by mouth 2 times daily    Historical Provider, MD   fluticasone-salmeterol (ADVAIR) 100-50 MCG/DOSE diskus inhaler Inhale 1 puff into the lungs every 12 hours    Historical Provider, MD   benzonatate (TESSALON) 100 MG capsule Take 100 mg by mouth 3 times daily as needed for Cough    Historical Provider, MD   cetirizine (ZYRTEC) 10 MG tablet Take 10 mg by mouth daily    Historical Provider, MD   hydroCHLOROthiazide (MICROZIDE) 12.5 MG capsule Take 12.5 mg by mouth daily    Historical Provider, MD   lisinopril (PRINIVIL;ZESTRIL) 20 MG tablet Take 20 mg by mouth daily  Patient not taking: Reported on 10/28/2020    Historical Provider, MD   SUMAtriptan (IMITREX) 50 MG tablet Take 50 mg by mouth once as needed for Migraine    Historical Provider, MD   traZODone (DESYREL) 50 MG tablet  Take 50 mg by mouth nightly 1 and 1/2 tabs    Historical Provider, MD   albuterol sulfate HFA (VENTOLIN HFA) 108 (90 Base) MCG/ACT inhaler Inhale 2 puffs into the lungs 4 times daily as needed for Wheezing 01/20/16   Marcella Dubs, MD   pantoprazole (PROTONIX) 40 MG tablet Take 1 tablet by mouth every morning (before breakfast) 10/20/23   Marcella Dubs, MD   docusate sodium (COLACE) 100 MG capsule Take 1 capsule by mouth 2 times daily 03/05/19   Virgia Land, MD   ondansetron (ZOFRAN ODT) 4 MG disintegrating tablet Take 1-2 tablets by mouth every 8 hours as needed for Nausea May substitute the non ODT tablets if not covered financially by the insurance plan.  Patient taking differently: Take 4-8 mg by mouth every 12 hours as needed for Nausea May substitute the non ODT tablets if not covered financially by the insurance plan. 07/15/18   Dionne Milo, MD   ondansetron (ZOFRAN) 4 MG tablet Take 1  tablet by mouth every 8 hours as needed for Nausea 06/23/18   Eleanora Neighbor, PA-C   hydrOXYzine (VISTARIL) 25 MG capsule Take 1 capsule by mouth 3 times daily as needed (anxiety and congestion) 06/23/18   Eleanora Neighbor, PA-C   pantoprazole (PROTONIX) 40 MG tablet Take 1 tablet by mouth 2 times daily (before meals) 02/09/18   Dimas Chyle, MD   levothyroxine (SYNTHROID) 50 MCG tablet Take 50 mcg by mouth Daily    Historical Provider, MD   pravastatin (PRAVACHOL) 40 MG tablet Take 40 mg by mouth nightly     Historical Provider, MD       Current medications:    No current facility-administered medications for this visit.     No current outpatient medications on file.     Facility-Administered Medications Ordered in Other Visits   Medication Dose Route Frequency Provider Last Rate Last Admin   ??? lactated ringers infusion   IntraVENous Continuous Wilson Singer, MD 100 mL/hr at 10/28/20 1610 New Bag at 10/28/20 9604       Allergies:  No Known Allergies    Problem List:    Patient Active  Problem List   Diagnosis Code   ??? Chest pain R07.9   ??? Cough R05.9   ??? Generalized abdominal pain R10.84       Past Medical History:        Diagnosis Date   ??? Asthma    ??? Back pain    ??? Dyslipidemia    ??? GERD (gastroesophageal reflux disease)    ??? Hyperlipidemia    ??? Hypertension    ??? Hypothyroidism        Past Surgical History:        Procedure Laterality Date   ??? ELBOW SURGERY Right    ??? HAND SURGERY     ??? UPPER GASTROINTESTINAL ENDOSCOPY N/A 02/08/2018    EGD BIOPSY performed by Norwood Levo, MD at [facility] History:    Social History     Tobacco Use   ??? Smoking status: Former Smoker   ??? Smokeless tobacco: Never Used   Substance Use Topics   ??? Alcohol use: Not Currently                                Counseling given: Not Answered      Vital Signs (Current):   There were no vitals filed for this visit.                                           BP Readings from Last 3 Encounters:   10/28/20 (!) 135/91   10/21/20 (!) 148/70   10/04/20 115/84       NPO Status:                                                                                 BMI:   Wt Readings from Last 3 Encounters:   10/28/20 148 lb (  67.1 kg)   10/21/20 146 lb (66.2 kg)   10/04/20 147 lb (66.7 kg)     There is no height or weight on file to calculate BMI.    CBC:   Lab Results   Component Value Date    WBC 3.9 10/28/2020    RBC 5.10 10/28/2020    HGB 14.7 10/28/2020    HCT 44.4 10/28/2020    MCV 87.1 10/28/2020    RDW 13.3 10/28/2020    PLT 274 10/28/2020       CMP:   Lab Results   Component Value Date    NA 135 10/04/2020    K 4.2 10/04/2020    CL 101 10/04/2020    CO2 20 10/04/2020    BUN 13 10/04/2020    CREATININE 0.9 10/04/2020    GFRAA >60 10/04/2020    AGRATIO 1.6 10/04/2020    LABGLOM >60 10/04/2020    GLUCOSE 110 10/04/2020    PROT 6.8 10/04/2020    CALCIUM 9.3 10/04/2020    BILITOT 1.4 10/04/2020    ALKPHOS 102 10/04/2020    AST 17 10/04/2020    ALT 10 10/04/2020       POC Tests: No results for input(s): POCGLU,  POCNA, POCK, POCCL, POCBUN, POCHEMO, POCHCT in the last 72 hours.    Coags:   Lab Results   Component Value Date    PROTIME 11.6 10/28/2020    INR 1.03 10/28/2020    APTT 37.0 04/05/2018       HCG (If Applicable): No results found for: PREGTESTUR, PREGSERUM, HCG, HCGQUANT     ABGs: No results found for: PHART, PO2ART, PCO2ART, HCO3ART, BEART, O2SATART     Type & Screen (If Applicable):  No results found for: LABABO, [place]    Anesthesia Evaluation  Patient summary reviewed and Nursing notes reviewed  Airway: Mallampati: II  TM distance: >3 FB   Neck ROM: full  Mouth opening: > = 3 FB Dental:          Pulmonary:   (+) COPD: moderate,                             Cardiovascular:  Exercise tolerance: poor (<4 METS),                     Neuro/Psych:               GI/Hepatic/Renal:   (+) GERD: well controlled,           Endo/Other:    (+) hypothyroidism::., .                 Abdominal:             Vascular:          Other Findings:               Anesthesia Plan      MAC and general     ASA 3     (TB screening. Isolation.)  Induction: intravenous.    MIPS: Prophylactic antiemetics administered.  Anesthetic plan and risks discussed with patient.      Plan discussed with CRNA.                  Lanelle Bal, MD   10/28/2020

## 2020-10-28 NOTE — Telephone Encounter (Signed)
Called the pt's daughter Garner [facility] to discuss findings on bronchoscopy. Spoke with her husband, informed that they should discuss switching lisinopril to an alternative medication, since lisinopril can be associated with cough.

## 2020-10-28 NOTE — Progress Notes (Signed)
Pt awake and alert at this time. Pt on RA, and VSS. Pt denies pain and nausea. Pt meets criteria to be discharged from Phase 1.

## 2020-10-28 NOTE — Discharge Instructions (Signed)
BRONCHOSCOPY DISCHARGE INSTRUCTIONS    Return to the ER for painful, persistent shortness of breath.    Do not drive, operate machinery, sign important papers or drink alcohol for 24 hours due to the sedation you received for the procedure.    You may resume your usual diet and medications.     You may experience some lightheadedness for the next several hours.   Plan on quiet relaxation for the rest of today.   A responsible adult needs to stay with you today.   Because of the medications you received today-do not drive,operate machinery,or sign any contractual agreement for the next 24 hours.   Do not drink any alcoholic beverages or take any unprescribed medications tonight.   Eat bland food and avoid anything greasy or spicy initially-progress to your normal diet gradually.   Diet restrictions as instructed.   You may resume home medications as instructed.   If you have any of the following problems, notify your physician or return to the hospital emergency room : fever, chills, excessive bleeding, excessive vomiting, difficulty swallowing, uncontrolled pain, increased abdominal distention, shortness of breath or any other problems.   If you have a sore throat, you may use lozenges or salt water gargles.   If you had a bronchoscopy and you experience sudden or continued shortness of breath, chest pain, spitting up or vomiting blood, notify your physician or return to the hospital emergency room.       ANESTHESIA DISCHARGE INSTRUCTIONS     Wear your seatbelt home.   You are under the influence of drugs-do not drink alcohol,drive,operate machinery,or make any important decisions or sign any legal documentsfor 24 hours   A responsible adult needs to be with you for 24 hours.   You may experience lightheadedness,dizziness,or sleepiness following surgery.   Rest at home today- increase activity as tolerated.   Progress slowly to a regular diet unless your physician has instructed you otherwise.Drink  plenty of water.   If nausea becomes a problem call your physician.   Coughing,sore throat,and muscle aches are other side effects of anesthesia,and should improve with time.   Do not drive,operate machinery while taking narcotics.

## 2020-10-28 NOTE — Progress Notes (Signed)
BAL  Location: Left Lingula    Input: 80 mL  Output: 34 mL

## 2020-10-28 NOTE — Progress Notes (Signed)
Teaching / education initiated regarding perioperative experience, expectations, and pain management during stay. Patient verbalized understanding.

## 2020-10-28 NOTE — Progress Notes (Signed)
This RN arrived to Endo room 5. Reported received from endo staff. P arousable to voice. Pt on 6L simple mask, SR, and VSS. Will continue to monitor.

## 2020-10-28 NOTE — H&P (Signed)
Pt seen and examined. 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 R/o TB. Plan for bronchoscopy and bronchial lavage. No change in H/P, refer to note from 5/12.

## 2020-10-28 NOTE — Anesthesia Post-Procedure Evaluation (Signed)
Department of Anesthesiology  Postprocedure Note    Patient: Michael Knight  MRN: [id number]  Birthdate: August 02, 1955  Date of evaluation: 10/28/2020  Time:  10:10 AM     Procedure Summary     Date: 10/28/20 Room / Location: [facility]    Anesthesia Start: 0725 Anesthesia Stop: 0810    Procedure: BRONCHOSCOPY ALVEOLAR LAVAGE (N/A ) Diagnosis: (COUGH R05.9)    Surgeons: Shireen Quan, MD Responsible Provider: Lanelle Bal, MD    Anesthesia Type: MAC, general ASA Status: 3          Anesthesia Type: No value filed.    Aldrete Phase I: Aldrete Score: 10    Aldrete Phase II: Aldrete Score: 10    Last vitals: Reviewed and per EMR flowsheets.       Anesthesia Post Evaluation    Patient location during evaluation: PACU  Patient participation: complete - patient participated  Level of consciousness: awake and alert  Airway patency: patent  Nausea & Vomiting: no nausea and no vomiting  Complications: no  Cardiovascular status: blood pressure returned to baseline  Respiratory status: acceptable  Hydration status: euvolemic  Multimodal analgesia pain management approach

## 2020-10-28 NOTE — Progress Notes (Signed)
Reviewed patient's medical and surgical history in electronic record and with patient at the bedside. All questions regarding procedure answered.     Scope verified using 2 person system.      Electronically signed by Era Bumpers, RN on 10/28/2020 at 7:35 AM

## 2020-10-30 LAB — CULTURE, RESPIRATORY
CULTURE, RESPIRATORY: <1000
CULTURE, RESPIRATORY: NORMAL

## 2020-11-02 ENCOUNTER — Ambulatory Visit: Admit: 2020-11-02 | Discharge: 2020-11-02 | Payer: MEDICAID | Attending: Internal Medicine | Primary: Adult Health

## 2020-11-02 DIAGNOSIS — R053 Chronic cough: Secondary | ICD-10-CM

## 2020-11-02 MED ORDER — PANTOPRAZOLE SODIUM 40 MG PO PACK
40 | Freq: Every day | ORAL | 0 refills | Status: DC
Start: 2020-11-02 — End: 2020-12-30

## 2020-11-02 NOTE — Progress Notes (Signed)
Michael Knight    Date of Birth: 04-13-1956     Date of Service:  11/02/2020     Chief Complaint   Patient presents with   ??? 2 Week Follow-Up     AFTER BRONCH         HPI [place] interpreter was used for the encounter.  Patient states he continues to have a cough, mostly nonproductive but does bring up mucus at times.  No hemoptysis, currently denies fevers.  Wants to know the results of bronchoscopy.    No Known Allergies  Outpatient Medications Marked as Taking for the 11/02/20 encounter (Office Visit) with Dulce Sellar, MD   Medication Sig Dispense Refill   ??? pantoprazole sodium (PROTONIX) 40 MG PACK packet Take 1 packet by mouth every morning (before breakfast) 30 each 0   ??? acetaminophen (TYLENOL) 500 MG tablet Take 500 mg by mouth every 6 hours as needed for Pain     ??? amLODIPine (NORVASC) 10 MG tablet Take 10 mg by mouth daily     ??? hydrOXYzine (ATARAX) 10 MG tablet Take 10 mg by mouth 3 times daily as needed for Itching     ??? polyethylene glycol (GLYCOLAX) 17 g packet Take 17 g by mouth daily as needed for Constipation     ??? montelukast (SINGULAIR) 10 MG tablet Take 10 mg by mouth nightly     ??? promethazine-codeine (PHENERGAN WITH CODEINE) 6.25-10 MG/5ML syrup Take 5 mLs by mouth every 12 hours as needed for Cough.     ??? GABAPENTIN PO Take by mouth 2 times daily     ??? fluticasone-salmeterol (ADVAIR) 100-50 MCG/DOSE diskus inhaler Inhale 1 puff into the lungs every 12 hours     ??? benzonatate (TESSALON) 100 MG capsule Take 100 mg by mouth 3 times daily as needed for Cough     ??? cetirizine (ZYRTEC) 10 MG tablet Take 10 mg by mouth daily     ??? hydroCHLOROthiazide (MICROZIDE) 12.5 MG capsule Take 12.5 mg by mouth daily     ??? lisinopril (PRINIVIL;ZESTRIL) 20 MG tablet Take 20 mg by mouth daily      ??? SUMAtriptan (IMITREX) 50 MG tablet Take 50 mg by mouth once as needed for Migraine     ??? traZODone (DESYREL) 50 MG tablet Take 50 mg by mouth nightly 1 and 1/2 tabs     ??? albuterol sulfate HFA (VENTOLIN HFA) 108 (90  Base) MCG/ACT inhaler Inhale 2 puffs into the lungs 4 times daily as needed for Wheezing 54 g 1   ??? pantoprazole (PROTONIX) 40 MG tablet Take 1 tablet by mouth every morning (before breakfast) 90 tablet 1   ??? docusate sodium (COLACE) 100 MG capsule Take 1 capsule by mouth 2 times daily 30 capsule 0   ??? ondansetron (ZOFRAN ODT) 4 MG disintegrating tablet Take 1-2 tablets by mouth every 8 hours as needed for Nausea May substitute the non ODT tablets if not covered financially by the insurance plan. (Patient taking differently: Take 4-8 mg by mouth every 12 hours as needed for Nausea May substitute the non ODT tablets if not covered financially by the insurance plan.) 12 tablet 0   ??? ondansetron (ZOFRAN) 4 MG tablet Take 1 tablet by mouth every 8 hours as needed for Nausea 20 tablet 0   ??? hydrOXYzine (VISTARIL) 25 MG capsule Take 1 capsule by mouth 3 times daily as needed (anxiety and congestion) 20 capsule 0   ??? pantoprazole (PROTONIX) 40 MG tablet Take 1  tablet by mouth 2 times daily (before meals) 60 tablet 2   ??? levothyroxine (SYNTHROID) 50 MCG tablet Take 50 mcg by mouth Daily     ??? pravastatin (PRAVACHOL) 40 MG tablet Take 40 mg by mouth nightly          Immunization History   Administered Date(s) Administered   ??? COVID-19, Pfizer Purple top, DILUTE for use, 12+ yrs, 20mcg/0.3mL dose 08/25/2019, 09/15/2019, 06/09/2020   ??? Influenza, Quadv, Recombinant, IM PF (Flublok 18 yrs and older) 05/09/2019   ??? Influenza, Triv, inactivated, subunit, adjuvanted, IM (Fluad 65 yrs and older) 04/13/2016       Past Medical History:   Diagnosis Date   ??? Asthma    ??? Back pain    ??? Dyslipidemia    ??? GERD (gastroesophageal reflux disease)    ??? Hyperlipidemia    ??? Hypertension    ??? Hypothyroidism      Past Surgical History:   Procedure Laterality Date   ??? BRONCHOSCOPY N/A 10/28/2020    BRONCHOSCOPY ALVEOLAR LAVAGE performed by Dulce Sellar, MD at [facility] ENDOSCOPY   ??? ELBOW SURGERY Right    ??? HAND SURGERY     ??? UPPER  GASTROINTESTINAL ENDOSCOPY N/A 02/08/2018    EGD BIOPSY performed by Kandice Robinsons, MD at [facility] END[facility]     History reviewed. No pertinent family history.    Review of Systems:  Review of Systems   Constitutional: Negative for activity change, appetite change, fatigue and fever.   HENT: Negative for congestion, ear discharge, ear pain, postnasal drip, rhinorrhea, sinus pressure, sneezing, sore throat, tinnitus and voice change.    Respiratory: Positive for cough. Negative for apnea, choking, chest tightness, shortness of breath, wheezing and stridor.    Cardiovascular: Negative for chest pain, palpitations and leg swelling.   Gastrointestinal: Negative for abdominal distention, abdominal pain, anal bleeding, blood in stool, constipation and diarrhea.   Musculoskeletal: Negative for arthralgias, back pain and gait problem.   Skin: Negative for pallor and rash.   Allergic/Immunologic: Negative for environmental allergies.   Neurological: Negative for dizziness, tremors, seizures, syncope, speech difficulty, weakness, light-headedness, numbness and headaches.   Hematological: Negative for adenopathy. Does not bruise/bleed easily.   Psychiatric/Behavioral: Negative for sleep disturbance.       Vitals:    11/02/20 1340   BP: 128/80   Pulse: 75   SpO2: 95%   Weight: 147 lb (66.7 kg)   Height: 5\' 2"  (1.575 m)     No flowsheet data found.   Body mass index is 26.89 kg/m??.     Wt Readings from Last 3 Encounters:   11/02/20 147 lb (66.7 kg)   10/28/20 148 lb (67.1 kg)   10/21/20 146 lb (66.2 kg)     BP Readings from Last 3 Encounters:   11/02/20 128/80   10/28/20 120/86   10/21/20 (!) 148/70         Physical Exam  Constitutional:       General: He is not in acute distress.     Appearance: He is well-developed. He is not diaphoretic.   HENT:      Mouth/Throat:      Pharynx: No oropharyngeal exudate.   Cardiovascular:      Rate and Rhythm: Normal rate and regular rhythm.      Heart sounds: Normal heart sounds. No murmur  heard.      Pulmonary:      Effort: No respiratory distress.  Breath sounds: Normal breath sounds. No wheezing or rales.   Chest:      Chest wall: No tenderness.   Abdominal:      General: There is no distension.      Palpations: There is no mass.      Tenderness: There is no abdominal tenderness. There is no guarding or rebound.   Musculoskeletal:         General: No swelling, tenderness or deformity.   Skin:     Coloration: Skin is not pale.      Findings: No erythema or rash.   Neurological:      Mental Status: He is alert and oriented to person, place, and time.      Cranial Nerves: No cranial nerve deficit.      Motor: No abnormal muscle tone.      Coordination: Coordination normal.      Deep Tendon Reflexes: Reflexes normal.             Health Maintenance   Topic Date Due   ??? Depression Screen  Never done   ??? HIV screen  Never done   ??? Hepatitis C screen  Never done   ??? DTaP/Tdap/Td vaccine (1 - Tdap) Never done   ??? Diabetes screen  Never done   ??? Prostate Specific Antigen (PSA) Screening or Monitoring  Never done   ??? Colorectal Cancer Screen  Never done   ??? Shingles vaccine (1 of 2) Never done   ??? Lipids  02/09/2019   ??? Flu vaccine (Season Ended) 02/10/2021   ??? COVID-19 Vaccine  Completed   ??? Hepatitis A vaccine  Aged Out   ??? Hepatitis B vaccine  Aged Out   ??? Hib vaccine  Aged Out   ??? Meningococcal (ACWY) vaccine  Aged Out   ??? Pneumococcal 0-64 years Vaccine  Aged Out          Assessment/Plan:    Bronchoscopy performed on 5/19, showed lymphocytes 55%.  All respiratory cultures were negative, cytology negative for malignancy.  AFB cultures are still awaited.  Reassured patient about the findings noted.  Patient has history of latent tuberculosis.  CT chest from 4/4 showed only features of atelectasis/left lingular fibrotic changes.    Persistent cough-which could be either related to ACE inhibitor or GERD.  Patient states that he has prior history of GERD, but currently "does not feel too bad".  Will  prescribe Protonix 40 mg daily x1 month as trial.  Patient has already made an appointment to see his PCP about changing lisinopril.  I advised the patient to continue lisinopril until then.    Return in about 2 months (around 01/02/2021).

## 2020-11-22 ENCOUNTER — Other Ambulatory Visit: Payer: Self-pay | Admitting: Internal Medicine

## 2020-11-23 LAB — LIPID PANEL
Cholesterol: 177 mg/dL (ref <200)
HDL: 48 mg/dL (ref >=40)
LDL Cholesterol (Calc): 105 mg/dL (calc) — ABNORMAL HIGH
Non-HDL Cholesterol (Calc): 129 mg/dL (calc) (ref <130)
Total CHOL/HDL Ratio: 3.7 (calc) (ref <5.0)
Triglycerides: 144 mg/dL (ref <150)

## 2020-11-23 LAB — EXTRA LAV TOP TUBE

## 2020-11-24 NOTE — Unmapped (Signed)
Patient called to schedule an appt with the MD. Patient can be reached at [phone]

## 2020-11-24 NOTE — Unmapped (Signed)
RN called patient to schedule NPV with provider for GERD as per referral with [place] interpreter. Patient scheduled for 12/08/20 @ 8:50 (8:35 am arrival)  am as per [place] interpreter. Location/address provided. Emphasized arriving on time explaining that provide 15 minute window but arrive 15 minutes past appt will ask to reschedule. Verbalized understanding and denied any needed clarification.

## 2020-11-29 LAB — CULTURE, FUNGUS
Fungus (Mycology) Culture: NO GROWTH
Fungus Stain: NONE SEEN

## 2020-12-02 LAB — CULTURE, FUNGUS: Fungus Stain: NONE SEEN

## 2020-12-08 ENCOUNTER — Ambulatory Visit: Admit: 2020-12-08 | Payer: MEDICAID

## 2020-12-08 DIAGNOSIS — R059 Cough, unspecified: Secondary | ICD-10-CM

## 2020-12-08 NOTE — Unmapped (Signed)
No labs today.     Keep track of your cough/ abdominal discomfort symptoms and let us know if anything changes.       Keep up the good work!!!!!     Remember to keep at the exercise routine, and watch your diet.  Minimize fatty/fried foods and increase fruits and vegetables.     Back to see [place] in 3 months!!!

## 2020-12-08 NOTE — Unmapped (Signed)
gastroenterology progress note.    PCP: Lars Pinks, CNP    Chief complaint:  Cough    Interval History:  Patient presents to our clinic today for consultation and management of his Cough.  He was referred by his PCP, Gayla Doss, CNP.      Briefly:  8-9 months ongoing cough-  Dry, nonproductive in nature-  Felt to initially be related to lisinopril,  However this medication was since stopped, and cough continued.  He did have a positive quantiferon TB testing, which was further investigated by pulmonary team at [facility].  Additional workup unremarkable.  UTS from [facility] with hepatic steatosis.      Recently started on phenergan/codeine with improvement in cough symptoms.  He does report some abdominal discomfort with the cough,  Starting midabdominal and progressing to RUQ/ribs on the right side. He does have a history of moderate/severe asthma and has been to the ER a few times for this,  Also on zyrtec for seasonal allergic rhinitis-      Denies any heartburn,  No belching,  No constipation/diarrhea.  Endorses 1-2 bowel movements daily, no straining,  Starts/stops naturally without major effort.  No blood in bowel.  No nausea or vomiting.  No chest pain/pressure, no shortness of breath, no blood in sputum.      States the coughing usually happens in the evening time, but can happen during the day.  Mostly when laying down.      Is on protonix BID,  [facility]-  Multiple small antral ulcers- (is on BID PPI)  Recent COLO through [facility] with Dr. Debbe Bales- one polyp-  To be repeated in 7-10 years.  (hyperplastic non-malignant)     Reviewed all pertinent medical records,  chart notes,  CareEverywhere documents,  Referrals,  and outside hospital documentation.  Reviewed laboratory findings and imaging studies    Review of Systems  See scanned sheet for complete patient reported ROS in addition to:  General ROS: negative for - chills, fever, malaise, night sweats, sleep  disturbance  Respiratory ROS: negative for - cough, hemoptysis, orthopnea, pleuritic pain, shortness of breath or wheezing  Cardiovascular ROS: negative for - chest pain, dyspnea on exertion or palpitations  Gastrointestinal ROS: negative for - change in bowel habits, hematemesis, nausea/vomiting or swallowing difficulty/pain    Vitals: reviewed on intake sheet / EPIC records.     Physical Exam   Nursing note and vitals reviewed.  Constitutional: Oriented to person, place, and time. Vital signs are normal. Patient appears well-developed and well-nourished. The patient does not appear ill. No distress.   HEENT:   Head: Normocephalic and atraumatic.   Right Ear: External ear normal.   Left Ear: External ear normal.   Nose: Nose normal.   Mouth/Throat: Oropharynx is clear and moist.   Eyes: Conjunctivae, EOM and lids are normal. Pupils are equal, round, and reactive to light. No scleral icterus.   Neck: Trachea normal and normal range of motion. Neck supple. No JVD present. No tracheal deviation present. No mass and no thyromegaly present.   Cardiovascular: Normal rate, regular rhythm, S1 normal, S2 normal and normal heart sounds.  Exam reveals no gallop, no S3, no S4 and no friction rub.  No murmur heard.  Pulmonary/Chest: Effort normal and breath sounds normal. No stridor. No respiratory distress. No decreased breath sounds. No wheezes. No rales.   Abdominal: Soft. Normal appearance and bowel sounds are normal. No shifting dullness, no distension, no fluid wave and  no mass. There is no hepatomegaly. There is no splenomegaly. There is no tenderness. There is no rigidity, no rebound, no guarding, no CVA tenderness and negative Murphy's sign. No hernia.  Musculoskeletal: Normal range of motion. No peripheral edema and no tenderness.     No muscle wasting.   Lymphadenopathy: No cervical adenopathy.   Neurological: The patient is alert and oriented to person, place, and time. No tremor. No cranial nerve deficit. Gait  normal. No asterixis   Skin: Skin is warm, dry and intact. No bruising and no rash noted. The patient is not diaphoretic. No cyanosis. No pallor. Nails show no clubbing. No spider angiomata, no palmar erythema   Psychiatric: The patient has a normal mood and affect. The patient's speech is normal and behavior is normal. Cognition and memory are normal      Laboratory:  No results found for: WBC, HGB, HCT, MCV, PLT  No results found for: CREATININE, BUN, NA, K, CL, CO2  No results found for: PTT, INR   No results found for: ALKPHOS, ALT, AST, BILITOT, ALBUMIN, BILIDIRECT  Imaging:  No images are attached to the encounter.    Assessment/Plan:  Cough-  Has been seen previously for management of GERD-  Is on Protonix Bid,  On phenergan/codeine-  With improvement in symptoms-  OSH providers have good plan in place as he is improving with his symptoms-  Will ask him to track cough symptoms-  Will see him back in 3 months, and if worsening,  May consider EGD for follow up-  (prior ulcerations (2019) and continued cough/GERD)  May consider 24 hour pH probes.      Symptoms of abdominal discomfort/rib side pain-  Likely musculoskeletal in nature.      Differential includes PND/refractory GERD exacerbation of his established asthma.      3 month follow up.     Reviewed all available imaging and laboratory studies,  referrals and consulting provider's notes.  Time spent includes review of medical records, imaging studies, documentation of findings as well as coordination of additional studies and laboratory studies.   ORDERS TODAY  Return to Clinic:    Future Appointments   Date Time Provider Department Center   03/09/2021  8:50 AM Army Fossa, CNP [facility]       Thank you for the opportunity to participate in the care of your patient, if you have any questions feel free to contact me via EPIC or through our office    Army Fossa, CNP  12/08/2020  12:00 PM    MDM

## 2020-12-14 LAB — CULTURE WITH SMEAR, ACID FAST BACILLIUS
AFB Culture (Mycobacteria): NO GROWTH
AFB Culture (Mycobacteria): NO GROWTH

## 2020-12-28 ENCOUNTER — Emergency Department: Admit: 2020-12-28 | Payer: MEDICAID | Primary: Adult Health

## 2020-12-28 ENCOUNTER — Inpatient Hospital Stay
Admit: 2020-12-28 | Discharge: 2020-12-31 | Disposition: A | Discharge status: Home Health | Payer: MEDICAID | Admitting: Internal Medicine

## 2020-12-28 DIAGNOSIS — R55 Syncope and collapse: Secondary | ICD-10-CM

## 2020-12-28 LAB — URINALYSIS WITH REFLEX TO CULTURE
Bilirubin Urine: NEGATIVE
Blood, Urine: NEGATIVE
Glucose, Ur: NEGATIVE mg/dL
Ketones, Urine: NEGATIVE mg/dL
Leukocyte Esterase, Urine: NEGATIVE
Nitrite, Urine: NEGATIVE
Protein, UA: NEGATIVE mg/dL
Specific Gravity, UA: 1.01 (ref 1.005–1.030)
Urobilinogen, Urine: 0.2 E.U./dL (ref <2.0)
pH, UA: 8.5 — AB (ref 5.0–8.0)

## 2020-12-28 LAB — BLOOD GAS, VENOUS
Base Excess, Ven: 2 mmol/L (ref <=3.0)
Carboxyhemoglobin: 1.2 % (ref 0.0–1.5)
HCO3, Venous: 22.6 mmol/L — ABNORMAL LOW (ref 23.0–29.0)
MetHgb, Ven: 0.2 % (ref <1.5)
O2 Content, Ven: 21 VOL %
O2 Sat, Ven: 98 %
TC02 (Calc), Ven: 52 mmol/L
pCO2, Ven: 24.9 mmHg — ABNORMAL LOW (ref 40.0–50.0)
pH, Ven: 7.566 — ABNORMAL HIGH (ref 7.350–7.450)
pO2, Ven: 194 mmHg — ABNORMAL HIGH (ref 25.0–40.0)

## 2020-12-28 LAB — COMPREHENSIVE METABOLIC PANEL W/ REFLEX TO MG FOR LOW K
ALT: 25 U/L (ref 10–40)
AST: 49 U/L — ABNORMAL HIGH (ref 15–37)
Albumin/Globulin Ratio: 1.2 (ref 1.1–2.2)
Albumin: 4.2 g/dL (ref 3.4–5.0)
Alkaline Phosphatase: 100 U/L (ref 40–129)
Anion Gap: 15 (ref 3–16)
BUN: 11 mg/dL (ref 7–20)
CO2: 19 mmol/L — ABNORMAL LOW (ref 21–32)
Calcium: 9.6 mg/dL (ref 8.3–10.6)
Chloride: 105 mmol/L (ref 99–110)
Creatinine: 0.9 mg/dL (ref 0.8–1.3)
GFR African American: >60 (ref >60)
GFR Non-African American: >60 (ref >60)
Glucose: 93 mg/dL (ref 70–99)
Potassium reflex Magnesium: 4.6 mmol/L (ref 3.5–5.1)
Sodium: 139 mmol/L (ref 136–145)
Total Bilirubin: 0.7 mg/dL (ref 0.0–1.0)
Total Protein: 7.6 g/dL (ref 6.4–8.2)

## 2020-12-28 LAB — CBC WITH AUTO DIFFERENTIAL
Basophils %: 0.5 %
Basophils Absolute: 0 10*3/uL (ref 0.0–0.2)
Eosinophils %: 1 %
Eosinophils Absolute: 0.1 10*3/uL (ref 0.0–0.6)
Hematocrit: 45.1 % (ref 40.5–52.5)
Hemoglobin: 14.9 g/dL (ref 13.5–17.5)
Lymphocytes %: 21.4 %
Lymphocytes Absolute: 1.6 10*3/uL (ref 1.0–5.1)
MCH: 28.9 pg (ref 26.0–34.0)
MCHC: 33.1 g/dL (ref 31.0–36.0)
MCV: 87.4 fL (ref 80.0–100.0)
MPV: 8.2 fL (ref 5.0–10.5)
Monocytes %: 6.7 %
Monocytes Absolute: 0.5 10*3/uL (ref 0.0–1.3)
Neutrophils %: 70.4 %
Neutrophils Absolute: 5.1 10*3/uL (ref 1.7–7.7)
Platelets: 252 10*3/uL (ref 135–450)
RBC: 5.16 M/uL (ref 4.20–5.90)
RDW: 14.3 % (ref 12.4–15.4)
WBC: 7.2 10*3/uL (ref 4.0–11.0)

## 2020-12-28 LAB — POCT GLUCOSE: POC Glucose: 96 mg/dl (ref 70–99)

## 2020-12-28 LAB — TROPONIN: Troponin: <0.01 ng/mL (ref <0.01)

## 2020-12-28 LAB — D-DIMER, QUANTITATIVE: D-Dimer, Quant: <0.27 ug/mL FEU (ref 0.00–0.60)

## 2020-12-28 NOTE — ED Triage Notes (Signed)
Pt arrived via [facility] from home d.t witnessed fall with LOC. Pt responds to pt. EMS reports that pt was outside and does not drink water per the family.

## 2020-12-28 NOTE — ED Notes (Signed)
Report given to [facility], RN     Cleon Gustin, RN  12/28/20 2126

## 2020-12-28 NOTE — Progress Notes (Signed)
12/28/20 2322   RT Protocol   History Pulmonary Disease 0   Respiratory pattern 0   Breath sounds 2   Cough 0   Indications for Bronchodilator Therapy On home bronchodilators   Bronchodilator Assessment Score 2

## 2020-12-28 NOTE — H&P (Signed)
HOSPITALISTS HISTORY AND PHYSICAL    12/28/2020 8:06 PM    Patient Information:  Michael Knight is a 65 y.o. male [id number]  PCP:  Ulice Bold, APRN - CNP (Tel: [phone] )    Chief complaint:    Chief Complaint   Patient presents with    Loss of Consciousness     Arrived via [facility] from home d/t witnessed fall with LOC; responds to pain; last hospital visit "recent"        History of Present Illness:  Michael Knight is a 65 y.o. male with hx HTN, Hypothyroid, HLD, and asthma.  Patient presents to ER for syncope.  Patient is Nepali speaking and interpretor was used. He states he was walking around outside when he felt dizzy and lightheaded and fell.  He is unsure if he lost consciousness however per ER nurse upon arrival to ER he was only responding to painful stimuli.  Currently he is awake and alert.  He just returned from the bathroom and denied any dizziness with ambulating.  He did have some Shortness of breath prior to episode and states he was going to get his inhaler but did not make it.   He denies any recent illness this week or new medications.  He reports he has been eating and drinking ok this week.  However family reports he does not drink much water.       History obtained from patient     REVIEW OF SYSTEMS:   Review of Systems   Constitutional: Negative.    HENT: Negative.     Eyes: Negative.    Respiratory:  Positive for shortness of breath.    Cardiovascular: Negative.    Gastrointestinal: Negative.    Genitourinary: Negative.    Musculoskeletal: Negative.    Skin: Negative.    Neurological:  Positive for dizziness, syncope and light-headedness. Negative for seizures, speech difficulty and headaches.   Psychiatric/Behavioral: Negative.     All other systems reviewed and are negative.    Past Medical History:   has a past medical history of Asthma, Back pain, Dyslipidemia, GERD  (gastroesophageal reflux disease), Hyperlipidemia, Hypertension, and Hypothyroidism.     Past Surgical History:   has a past surgical history that includes Upper gastrointestinal endoscopy (N/A, 02/08/2018); Hand surgery; Elbow surgery (Right); and bronchoscopy (N/A, 10/28/2020).     Medications:  No current facility-administered medications on file prior to encounter.     Current Outpatient Medications on File Prior to Encounter   Medication Sig Dispense Refill    pantoprazole sodium (PROTONIX) 40 MG PACK packet Take 1 packet by mouth every morning (before breakfast) 30 each 0    acetaminophen (TYLENOL) 500 MG tablet Take 500 mg by mouth every 6 hours as needed for Pain      amLODIPine (NORVASC) 10 MG tablet Take 10 mg by mouth daily      hydrOXYzine (ATARAX) 10 MG tablet Take 10 mg by mouth 3 times daily as needed for Itching      polyethylene glycol (GLYCOLAX) 17 g packet Take 17 g by mouth daily as needed for Constipation      montelukast (SINGULAIR) 10 MG tablet Take 10 mg by mouth nightly      promethazine-codeine (PHENERGAN WITH CODEINE) 6.25-10 MG/5ML syrup Take 5 mLs by mouth every 12 hours as needed for Cough.      GABAPENTIN PO Take by mouth 2 times daily      fluticasone-salmeterol (ADVAIR) 100-50 MCG/DOSE diskus inhaler Inhale 1  puff into the lungs every 12 hours      benzonatate (TESSALON) 100 MG capsule Take 100 mg by mouth 3 times daily as needed for Cough      cetirizine (ZYRTEC) 10 MG tablet Take 10 mg by mouth daily      hydroCHLOROthiazide (MICROZIDE) 12.5 MG capsule Take 12.5 mg by mouth daily      lisinopril (PRINIVIL;ZESTRIL) 20 MG tablet Take 20 mg by mouth daily       SUMAtriptan (IMITREX) 50 MG tablet Take 50 mg by mouth once as needed for Migraine      traZODone (DESYREL) 50 MG tablet Take 50 mg by mouth nightly 1 and 1/2 tabs      albuterol sulfate HFA (VENTOLIN HFA) 108 (90 Base) MCG/ACT inhaler Inhale 2 puffs into the lungs 4 times daily as needed for Wheezing 54 g 1    pantoprazole  (PROTONIX) 40 MG tablet Take 1 tablet by mouth every morning (before breakfast) 90 tablet 1    docusate sodium (COLACE) 100 MG capsule Take 1 capsule by mouth 2 times daily 30 capsule 0    ondansetron (ZOFRAN ODT) 4 MG disintegrating tablet Take 1-2 tablets by mouth every 8 hours as needed for Nausea May substitute the non ODT tablets if not covered financially by the insurance plan. (Patient taking differently: Take 4-8 mg by mouth every 12 hours as needed for Nausea May substitute the non ODT tablets if not covered financially by the insurance plan.) 12 tablet 0    ondansetron (ZOFRAN) 4 MG tablet Take 1 tablet by mouth every 8 hours as needed for Nausea 20 tablet 0    hydrOXYzine (VISTARIL) 25 MG capsule Take 1 capsule by mouth 3 times daily as needed (anxiety and congestion) 20 capsule 0    pantoprazole (PROTONIX) 40 MG tablet Take 1 tablet by mouth 2 times daily (before meals) 60 tablet 2    levothyroxine (SYNTHROID) 50 MCG tablet Take 50 mcg by mouth Daily      pravastatin (PRAVACHOL) 40 MG tablet Take 40 mg by mouth nightly          Allergies:  No Known Allergies     Social History:  Patient Lives    reports that he has quit smoking. He has never used smokeless tobacco. He reports that he does not currently use alcohol. He reports that he does not use drugs.     Family History:  Family hx reviewed for CAD, DM, and HTN.     Physical Exam:  BP (!) 132/92    Pulse 74    Temp 98 ??F (36.7 ??C) (Axillary)    Resp 13    Ht 5\' 2"  (1.575 m)    Wt 150 lb (68 kg)    SpO2 100%    BMI 27.44 kg/m??   Physical Exam  Vitals reviewed.   Constitutional:       Appearance: Normal appearance. He is not ill-appearing.   HENT:      Head: Normocephalic.      Nose: Nose normal.      Mouth/Throat:      Mouth: Mucous membranes are moist.   Eyes:      Pupils: Pupils are equal, round, and reactive to light.   Cardiovascular:      Rate and Rhythm: Normal rate and regular rhythm.      Pulses: Normal pulses.      Heart sounds: No murmur  heard.  Pulmonary:      Effort: Pulmonary  effort is normal. No respiratory distress.      Breath sounds: Normal breath sounds.   Abdominal:      General: Abdomen is flat. Bowel sounds are normal. There is no distension.      Palpations: Abdomen is soft.   Musculoskeletal:         General: Normal range of motion.      Right lower leg: No edema.      Left lower leg: No edema.   Skin:     General: Skin is warm and dry.      Capillary Refill: Capillary refill takes less than 2 seconds.   Neurological:      General: No focal deficit present.      Mental Status: He is alert and oriented to person, place, and time.      Cranial Nerves: No cranial nerve deficit.   Psychiatric:         Mood and Affect: Mood normal.       Labs:  CBC:   Lab Results   Component Value Date/Time    WBC 7.2 12/28/2020 04:22 PM    RBC 5.16 12/28/2020 04:22 PM    HGB 14.9 12/28/2020 04:22 PM    HCT 45.1 12/28/2020 04:22 PM    MCV 87.4 12/28/2020 04:22 PM    MCH 28.9 12/28/2020 04:22 PM    MCHC 33.1 12/28/2020 04:22 PM    RDW 14.3 12/28/2020 04:22 PM    PLT 252 12/28/2020 04:22 PM    MPV 8.2 12/28/2020 04:22 PM     BMP:    Lab Results   Component Value Date/Time    NA 139 12/28/2020 04:22 PM    K 4.6 12/28/2020 04:22 PM    CL 105 12/28/2020 04:22 PM    CO2 19 12/28/2020 04:22 PM    BUN 11 12/28/2020 04:22 PM    CREATININE 0.9 12/28/2020 04:22 PM    CALCIUM 9.6 12/28/2020 04:22 PM    GFRAA >60 12/28/2020 04:22 PM    LABGLOM >60 12/28/2020 04:22 PM    GLUCOSE 93 12/28/2020 04:22 PM     XR CHEST PORTABLE   Final Result   No acute process.      Bibasilar hypoaeration         CT Head WO Contrast   Final Result      1.  No acute intracranial abnormality noted.      2.  Mild left sphenoid and right ethmoid sinusitis.           Chest Xray:   EKG:    I visualized CXR images and EKG strips    Problem List  Active Problems:    * No active hospital problems. *  Resolved Problems:    * No resolved hospital problems. *        Assessment/Plan:   Syncope  Admit  observation with telemetry  Rule out cardiac source  Serial troponin, telemetry, 2D echo in am.  TSH  Orthostatic BP, HR  IV fluids  CK pending  UDS  Currently he is alert and oriented but initially was not alert and oriented on presentation to ER.     2. Hypertension  BP stable, restart BP meds in am  Hold HCTZ    3. Hyperlipidemia  Continue statin    4. Hypothyroid  Continue synthroid        DVT prophylaxis Lovenox  Code status Full   Diet Cardiac   IV access peripheral   Foley Catheter none  Admit as Observation I anticipate hospitalization spanning less than two midnights for investigation and treatment of the above medically necessary diagnoses.    Please note that some part of this chart was generated using Scientist, clinical (histocompatibility and immunogenetics)Dragon dictation software. Although every effort was made to ensure the accuracy of this automated transcription, some errors in transcription may have occurred inadvertently. If you may need any clarification, please do not hesitate to contact me through [facility].       Landis Gandyatosha Tiny Rietz, APRN - CNP    12/28/2020 8:06 PM

## 2020-12-28 NOTE — ED Provider Notes (Signed)
[facility] EMERGENCY DEPARTMENT  EMERGENCY DEPARTMENTENCOUNTER      Pt Name: Michael Knight  MRN: [id number]  Birthdate Feb 09, 1956  Date ofevaluation: 12/28/2020  Provider: Joan Flores, MD    CHIEF COMPLAINT       Chief Complaint   Patient presents with    Loss of Consciousness     Arrived via [facility] from home d/t witnessed fall with LOC; responds to pain; last hospital visit "recent"       HPI    HISTORY OF PRESENT ILLNESS   (Location/Symptom, Timing/Onset,Context/Setting, Quality, Duration, Modifying Factors, Severity)  Note limiting factors.   Michael Knight is a 65 y.o. male who presents to the emergency department after passing out.  This is a 65 year old [place] male who presents after a witnessed syncopal episode and fall at his house.  He denies any chest pain.  He denies abdominal pain.  Apparently, he was out in the hot weather when this happened.  The family also states he does not drink enough water.        NursingNotes were reviewed.    Review of Systems    REVIEW OF SYSTEMS    (2-9 systems for level 4, 10 or more for level 5)     Review of Systems   Constitutional: Negative for fever.   HENT: Negative for rhinorrhea and sore throat.    Eyes: Negative for redness.   Respiratory: Negative for shortness of breath.    Cardiovascular: Negative for chest pain.   Gastrointestinal: Negative for abdominal pain.   Genitourinary: Negative for flank pain.   Neurological: Negative for headaches.   Hematological: Negative for adenopathy.   Psychiatric/Behavioral: Negative for confusion.              Except as noted above the remainder of the review of systems was reviewed and negative.       PAST MEDICAL HISTORY     Past Medical History:   Diagnosis Date    Asthma     Back pain     Dyslipidemia     GERD (gastroesophageal reflux disease)     Hyperlipidemia     Hypertension     Hypothyroidism          SURGICALHISTORY       Past Surgical History:   Procedure Laterality Date    BRONCHOSCOPY N/A 10/28/2020     BRONCHOSCOPY ALVEOLAR LAVAGE performed by Dulce Sellar, MD at [facility]    ELBOW SURGERY Right     HAND SURGERY      UPPER GASTROINTESTINAL ENDOSCOPY N/A 02/08/2018    EGD BIOPSY performed by Kandice Robinsons, MD at [facility]         CURRENT MEDICATIONS       Previous Medications    ACETAMINOPHEN (TYLENOL) 500 MG TABLET    Take 500 mg by mouth every 6 hours as needed for Pain    ALBUTEROL SULFATE HFA (VENTOLIN HFA) 108 (90 BASE) MCG/ACT INHALER    Inhale 2 puffs into the lungs 4 times daily as needed for Wheezing    AMLODIPINE (NORVASC) 10 MG TABLET    Take 10 mg by mouth daily    BENZONATATE (TESSALON) 100 MG CAPSULE    Take 100 mg by mouth 3 times daily as needed for Cough    CETIRIZINE (ZYRTEC) 10 MG TABLET    Take 10 mg by mouth daily    DOCUSATE SODIUM (COLACE) 100 MG CAPSULE    Take  1 capsule by mouth 2 times daily    FLUTICASONE-SALMETEROL (ADVAIR) 100-50 MCG/DOSE DISKUS INHALER    Inhale 1 puff into the lungs every 12 hours    GABAPENTIN PO    Take by mouth 2 times daily    HYDROCHLOROTHIAZIDE (MICROZIDE) 12.5 MG CAPSULE    Take 12.5 mg by mouth daily    HYDROXYZINE (ATARAX) 10 MG TABLET    Take 10 mg by mouth 3 times daily as needed for Itching    HYDROXYZINE (VISTARIL) 25 MG CAPSULE    Take 1 capsule by mouth 3 times daily as needed (anxiety and congestion)    LEVOTHYROXINE (SYNTHROID) 50 MCG TABLET    Take 50 mcg by mouth Daily    LISINOPRIL (PRINIVIL;ZESTRIL) 20 MG TABLET    Take 20 mg by mouth daily     MONTELUKAST (SINGULAIR) 10 MG TABLET    Take 10 mg by mouth nightly    ONDANSETRON (ZOFRAN ODT) 4 MG DISINTEGRATING TABLET    Take 1-2 tablets by mouth every 8 hours as needed for Nausea May substitute the non ODT tablets if not covered financially by the insurance plan.    ONDANSETRON (ZOFRAN) 4 MG TABLET    Take 1 tablet by mouth every 8 hours as needed for Nausea    PANTOPRAZOLE (PROTONIX) 40 MG TABLET    Take 1 tablet by mouth 2 times daily (before meals)    PANTOPRAZOLE  (PROTONIX) 40 MG TABLET    Take 1 tablet by mouth every morning (before breakfast)    PANTOPRAZOLE SODIUM (PROTONIX) 40 MG PACK PACKET    Take 1 packet by mouth every morning (before breakfast)    POLYETHYLENE GLYCOL (GLYCOLAX) 17 G PACKET    Take 17 g by mouth daily as needed for Constipation    PRAVASTATIN (PRAVACHOL) 40 MG TABLET    Take 40 mg by mouth nightly     PROMETHAZINE-CODEINE (PHENERGAN WITH CODEINE) 6.25-10 MG/5ML SYRUP    Take 5 mLs by mouth every 12 hours as needed for Cough.    SUMATRIPTAN (IMITREX) 50 MG TABLET    Take 50 mg by mouth once as needed for Migraine    TRAZODONE (DESYREL) 50 MG TABLET    Take 50 mg by mouth nightly 1 and 1/2 tabs       ALLERGIES     Patient has no known allergies.    FAMILY HISTORY     No family history on file.       SOCIAL HISTORY       Social History     Socioeconomic History    Marital status: Unknown   Tobacco Use    Smoking status: Former    Smokeless tobacco: Never   Haematologist Use: Never used   Substance and Sexual Activity    Alcohol use: Not Currently    Drug use: Never    Sexual activity: Not Currently       SCREENINGS    Glasgow Coma Scale  Eye Opening: To pain  Best Verbal Response: Oriented (pt noted to be speaking with son at bedside)  Best Motor Response: Withdraws from pain  Glasgow Coma Scale Score: 11        PHYSICAL EXAM    (up to 7 for level 4, 8 or more for level 5)     ED Triage Vitals   BP Temp Temp Source Heart Rate Resp SpO2 Height Weight   12/28/20 1609 12/28/20 1609 12/28/20 1609 12/28/20 1609  12/28/20 1609 12/28/20 1609 12/28/20 1603 12/28/20 1603   125/83 98 ??F (36.7 ??C) Axillary 81 25 100 % 5\' 2"  (1.575 m) 150 lb (68 kg)       Physical Exam:      General Appearance:  Alert, cooperative, appears stated age.  Initially appeared lethargic.   Head:  Normocephalic, without obvious abnormality, atraumatic.   Eyes:  conjunctiva/corneas clear, EOM's intact.  Sclera anicteric.   ENT: Mucous remains are moist and pink   Neck: Supple,  symmetrical, trachea midline, no adenopathy.  No jugular venous distention.     Lungs:   Clear to auscultation bilaterally   Chest Wall:  No pain to palpation   Heart:   Genitourinary: Regular rate rhythm with no murmurs rubs gallops  Unremarkable   Abdomen:   Soft and benign.  No guarding rebound.   Extremities: No clubbing cyanosis or edema   Pulses: Good throughout   Skin:  No rashes or lesions to exposed skin.   Neurologic: Initially.  Be lethargic however, after I sternal rub the patient he was alert and oriented X 3.          DIAGNOSTIC RESULTS     EKG: All EKG's are interpreted by the Emergency Department Physician who either signs or Co-signsthis chart in the absence of a cardiologist.    Sinus rhythm at a rate of 84 beats a minute with no acute ST elevations or depressions or pathologic Q waves.  Normal axis.    RADIOLOGY:   Non-plain filmimages such as CT, Ultrasound and MRI are read by the radiologist. Plain radiographic images are visualized and preliminarily interpreted by the emergency physician with the below findings:    See below    Interpretation per the Radiologist below, if available at the time ofthis note:    All incidental findings were discussed with the patient.    XR CHEST PORTABLE   Final Result   No acute process.      Bibasilar hypoaeration         CT Head WO Contrast   Final Result      1.  No acute intracranial abnormality noted.      2.  Mild left sphenoid and right ethmoid sinusitis.               ED BEDSIDE ULTRASOUND:   Performed by ED Physician - none    LABS:  Labs Reviewed   COMPREHENSIVE METABOLIC PANEL W/ REFLEX TO MG FOR LOW K - Abnormal; Notable for the following components:       Result Value    CO2 19 (*)     AST 49 (*)     All other components within normal limits   URINALYSIS WITH REFLEX TO CULTURE - Abnormal; Notable for the following components:    pH, UA 8.5 (*)     All other components within normal limits   BLOOD GAS, VENOUS - Abnormal; Notable for the following  components:    pH, Ven 7.566 (*)     pCO2, Ven 24.9 (*)     pO2, Ven 194.0 (*)     HCO3, Venous 22.6 (*)     All other components within normal limits   CBC WITH AUTO DIFFERENTIAL   TROPONIN   D-DIMER, QUANTITATIVE   POCT GLUCOSE       All other labs were within normal range or not returned as of this dictation.    EMERGENCY DEPARTMENT COURSE and DIFFERENTIAL DIAGNOSIS/MDM:   Vitals:  Vitals:    12/28/20 1745 12/28/20 1800 12/28/20 1815 12/28/20 1826   BP: (!) 128/94 (!) 124/92 (!) 132/92    Pulse: 77 77 74 74   Resp: 12 14 13     Temp:       TempSrc:       SpO2: 100% 100% 100%    Weight:       Height:               MDM      The patient has remained stable throughout his hospital course.  A CT of his head was obtained is unremarkable normal.  A cardiac work-up was unremarkable normal.  The patient was mildly alkalotic on his VBG.  On reexamination, he appears to be alert and oriented.  However, given the patient's syncopal episode I felt it prudent to admit him for further care and consultation.  He is in stable condition.          REASSESSMENT            CONSULTS:  None    PROCEDURES:  Unless otherwise noted below, none     Procedures    FINAL IMPRESSION      1. Syncope and collapse          DISPOSITION/PLAN   DISPOSITION Decision To Admit 12/28/2020 07:41:14 PM      PATIENT REFERREDTO:  No follow-up provider specified.    DISCHARGEMEDICATIONS:  New Prescriptions    No medications on file     Controlled Substances Monitoring:     No flowsheet data found.    (Please note that portions of this note were completed with a voice recognition program.  Efforts were made to edit the dictations but occasionally words are mis-transcribed.)    Joan Floreshomas J Argelio Granier, MD (electronically signed)  Attending Emergency Physician          Joan Floreshomas J Natanel Snavely, MD  12/28/20 1946

## 2020-12-29 ENCOUNTER — Observation Stay: Admit: 2020-12-29 | Payer: MEDICAID | Primary: Adult Health

## 2020-12-29 LAB — EKG 12-LEAD
Atrial Rate: 84 {beats}/min
P Axis: 41 degrees
P-R Interval: 258 ms
Q-T Interval: 388 ms
QRS Duration: 88 ms
QTc Calculation (Bazett): 458 ms
R Axis: 89 degrees
T Axis: 26 degrees
Ventricular Rate: 84 {beats}/min

## 2020-12-29 LAB — CBC WITH AUTO DIFFERENTIAL
Basophils %: 0.8 %
Basophils Absolute: 0.1 10*3/uL (ref 0.0–0.2)
Eosinophils %: 1.4 %
Eosinophils Absolute: 0.1 10*3/uL (ref 0.0–0.6)
Hematocrit: 41.3 % (ref 40.5–52.5)
Hemoglobin: 13.6 g/dL (ref 13.5–17.5)
Lymphocytes %: 20.6 %
Lymphocytes Absolute: 1.3 10*3/uL (ref 1.0–5.1)
MCH: 28.9 pg (ref 26.0–34.0)
MCHC: 33 g/dL (ref 31.0–36.0)
MCV: 87.4 fL (ref 80.0–100.0)
MPV: 7.9 fL (ref 5.0–10.5)
Monocytes %: 8.6 %
Monocytes Absolute: 0.5 10*3/uL (ref 0.0–1.3)
Neutrophils %: 68.6 %
Neutrophils Absolute: 4.3 10*3/uL (ref 1.7–7.7)
Platelets: 245 10*3/uL (ref 135–450)
RBC: 4.72 M/uL (ref 4.20–5.90)
RDW: 14.4 % (ref 12.4–15.4)
WBC: 6.3 10*3/uL (ref 4.0–11.0)

## 2020-12-29 LAB — TROPONIN
Troponin: <0.01 ng/mL (ref <0.01)
Troponin: <0.01 ng/mL (ref <0.01)

## 2020-12-29 LAB — ECHOCARDIOGRAM COMPLETE 2D W DOPPLER W COLOR: Left Ventricular Ejection Fraction: 63

## 2020-12-29 LAB — COMPREHENSIVE METABOLIC PANEL W/ REFLEX TO MG FOR LOW K
ALT: 19 U/L (ref 10–40)
AST: 26 U/L (ref 15–37)
Albumin/Globulin Ratio: 1.8 (ref 1.1–2.2)
Albumin: 4.1 g/dL (ref 3.4–5.0)
Alkaline Phosphatase: 98 U/L (ref 40–129)
Anion Gap: 7 (ref 3–16)
BUN: 10 mg/dL (ref 7–20)
CO2: 28 mmol/L (ref 21–32)
Calcium: 9 mg/dL (ref 8.3–10.6)
Chloride: 108 mmol/L (ref 99–110)
Creatinine: 0.8 mg/dL (ref 0.8–1.3)
GFR African American: >60 (ref >60)
GFR Non-African American: >60 (ref >60)
Glucose: 133 mg/dL — ABNORMAL HIGH (ref 70–99)
Potassium reflex Magnesium: 3.9 mmol/L (ref 3.5–5.1)
Sodium: 143 mmol/L (ref 136–145)
Total Bilirubin: 0.8 mg/dL (ref 0.0–1.0)
Total Protein: 6.4 g/dL (ref 6.4–8.2)

## 2020-12-29 LAB — CK: Total CK: 294 U/L (ref 39–308)

## 2020-12-29 MED ORDER — MECLIZINE HCL 12.5 MG PO TABS
12.5 MG | Freq: Three times a day (TID) | ORAL | Status: DC | PRN
Start: 2020-12-29 — End: 2020-12-31

## 2020-12-29 MED ORDER — SODIUM CHLORIDE 0.9 % IV SOLN
0.9 | INTRAVENOUS | Status: DC | PRN
Start: 2020-12-29 — End: 2020-12-31

## 2020-12-29 MED ORDER — DOCUSATE SODIUM 100 MG PO CAPS
100 MG | Freq: Two times a day (BID) | ORAL | Status: DC
Start: 2020-12-29 — End: 2020-12-31
  Administered 2020-12-29 – 2020-12-30 (×4): 100 mg via ORAL

## 2020-12-29 MED ORDER — AMLODIPINE BESYLATE 5 MG PO TABS
5 MG | Freq: Every day | ORAL | Status: DC
Start: 2020-12-29 — End: 2020-12-30
  Administered 2020-12-29: 12:00:00 10 mg via ORAL

## 2020-12-29 MED ORDER — PANTOPRAZOLE SODIUM 40 MG PO TBEC
40 MG | Freq: Every day | ORAL | Status: DC
Start: 2020-12-29 — End: 2020-12-31
  Administered 2020-12-29 – 2020-12-30 (×2): 40 mg via ORAL

## 2020-12-29 MED ORDER — ONDANSETRON 4 MG PO TBDP
4 MG | Freq: Three times a day (TID) | ORAL | Status: DC | PRN
Start: 2020-12-29 — End: 2020-12-31

## 2020-12-29 MED ORDER — ACETAMINOPHEN 325 MG PO TABS
325 MG | Freq: Four times a day (QID) | ORAL | Status: DC | PRN
Start: 2020-12-29 — End: 2020-12-31
  Administered 2020-12-30: 18:00:00 650 mg via ORAL

## 2020-12-29 MED ORDER — PERFLUTREN LIPID MICROSPHERE IV SUSP
Freq: Once | INTRAVENOUS | Status: DC | PRN
Start: 2020-12-29 — End: 2020-12-31

## 2020-12-29 MED ORDER — NORMAL SALINE FLUSH 0.9 % IV SOLN
0.9 % | INTRAVENOUS | Status: DC | PRN
Start: 2020-12-29 — End: 2020-12-31

## 2020-12-29 MED ORDER — PRAVASTATIN SODIUM 40 MG PO TABS
40 MG | Freq: Every evening | ORAL | Status: DC
Start: 2020-12-29 — End: 2020-12-31
  Administered 2020-12-29 – 2020-12-30 (×2): 40 mg via ORAL

## 2020-12-29 MED ORDER — MOMETASONE FURO-FORMOTEROL FUM 100-5 MCG/ACT IN AERO
100-5 MCG/ACT | Freq: Two times a day (BID) | RESPIRATORY_TRACT | Status: DC
Start: 2020-12-29 — End: 2020-12-31
  Administered 2020-12-29 – 2020-12-30 (×3): 2 via RESPIRATORY_TRACT

## 2020-12-29 MED ORDER — ONDANSETRON HCL 4 MG/2ML IJ SOLN
4 MG/2ML | Freq: Four times a day (QID) | INTRAMUSCULAR | Status: DC | PRN
Start: 2020-12-29 — End: 2020-12-31

## 2020-12-29 MED ORDER — LACTATED RINGERS IV SOLN
INTRAVENOUS | Status: DC
Start: 2020-12-29 — End: 2020-12-29
  Administered 2020-12-29 (×2): via INTRAVENOUS

## 2020-12-29 MED ORDER — NORMAL SALINE FLUSH 0.9 % IV SOLN
0.9 % | Freq: Two times a day (BID) | INTRAVENOUS | Status: DC
Start: 2020-12-29 — End: 2020-12-31
  Administered 2020-12-29 – 2020-12-30 (×4): 10 mL via INTRAVENOUS

## 2020-12-29 MED ORDER — ENOXAPARIN SODIUM 40 MG/0.4ML IJ SOSY
40 MG/0.4ML | Freq: Every day | INTRAMUSCULAR | Status: DC
Start: 2020-12-29 — End: 2020-12-31
  Administered 2020-12-30: 12:00:00 40 mg via SUBCUTANEOUS

## 2020-12-29 MED ORDER — POLYETHYLENE GLYCOL 3350 17 G PO PACK
17 g | Freq: Every day | ORAL | Status: DC | PRN
Start: 2020-12-29 — End: 2020-12-31

## 2020-12-29 MED ORDER — LISINOPRIL 20 MG PO TABS
20 MG | Freq: Every day | ORAL | Status: DC
Start: 2020-12-29 — End: 2020-12-30
  Administered 2020-12-29: 12:00:00 20 mg via ORAL

## 2020-12-29 MED ORDER — MECLIZINE HCL 12.5 MG PO TABS
12.5 MG | Freq: Three times a day (TID) | ORAL | Status: DC
Start: 2020-12-29 — End: 2020-12-29
  Administered 2020-12-29: 14:00:00 25 mg via ORAL

## 2020-12-29 MED ORDER — LEVOTHYROXINE SODIUM 25 MCG PO TABS
25 MCG | Freq: Every day | ORAL | Status: DC
Start: 2020-12-29 — End: 2020-12-31
  Administered 2020-12-29 – 2020-12-30 (×2): 50 ug via ORAL

## 2020-12-29 MED ORDER — ALBUTEROL SULFATE HFA 108 (90 BASE) MCG/ACT IN AERS
108 (90 Base) MCG/ACT | Freq: Four times a day (QID) | RESPIRATORY_TRACT | Status: DC | PRN
Start: 2020-12-29 — End: 2020-12-31

## 2020-12-29 MED ORDER — ACETAMINOPHEN 650 MG RE SUPP
650 MG | Freq: Four times a day (QID) | RECTAL | Status: DC | PRN
Start: 2020-12-29 — End: 2020-12-31

## 2020-12-29 MED FILL — AMLODIPINE BESYLATE 5 MG PO TABS: 5 mg | ORAL | Qty: 2

## 2020-12-29 MED FILL — MECLIZINE HCL 12.5 MG PO TABS: 12.5 mg | ORAL | Qty: 2

## 2020-12-29 MED FILL — LOVENOX 40 MG/0.4ML IJ SOSY: 40 MG/0.4ML | INTRAMUSCULAR | Qty: 0.4

## 2020-12-29 MED FILL — PANTOPRAZOLE SODIUM 40 MG PO TBEC: 40 mg | ORAL | Qty: 1

## 2020-12-29 MED FILL — DOCUSATE SODIUM 100 MG PO CAPS: 100 mg | ORAL | Qty: 1

## 2020-12-29 MED FILL — LEVOTHYROXINE SODIUM 25 MCG PO TABS: 25 ug | ORAL | Qty: 2

## 2020-12-29 MED FILL — PRAVASTATIN SODIUM 40 MG PO TABS: 40 mg | ORAL | Qty: 1

## 2020-12-29 MED FILL — PROAIR HFA 108 (90 BASE) MCG/ACT IN AERS: 108 (90 Base) MCG/ACT | RESPIRATORY_TRACT | Qty: 8.5

## 2020-12-29 MED FILL — MOMETASONE FURO-FORMOTEROL FUM 100-5 MCG/ACT IN AERO: 100-5 MCG/ACT | RESPIRATORY_TRACT | Qty: 13

## 2020-12-29 MED FILL — LISINOPRIL 20 MG PO TABS: 20 mg | ORAL | Qty: 1

## 2020-12-29 NOTE — Progress Notes (Signed)
Pt MRI checklist reviewed thoroughly with the patient and completed and faxed.

## 2020-12-29 NOTE — Progress Notes (Signed)
Hospitalist Progress Note      PCP: Ulice Bold, APRN - CNP    Date of Admission: 12/28/2020    Chief Complaint: LOC    Hospital Course:  Michael Knight is a 65 y.o. male with hx HTN, Hypothyroid, HLD, and asthma.  Patient presented to ER for syncope. He stated he was walking around outside when he felt dizzy and lightheaded and fell.  He was unsure if he lost consciousness however per ER nurse upon arrival to ER he was only responding to painful stimuli. He did have some Shortness of breath prior to episode and stated he was going to get his inhaler but did not make it. He denied any recent illness this week or new medications.       Subjective: Patient seen and examined.      On evaluation by PT it was noted that patient had difficulty ambulating with complaints of severe headaches due to noise and lights in the hallway.    On further questioning patient explained this has been ongoing for a while.  Noise and light seems to aggravate his dizziness and causes syncopal episodes.    Patient seen and examined. No interval events. Denies any complaints. No pain, fever or chills.     Medications:  Reviewed    Infusion Medications    sodium chloride      lactated ringers 100 mL/hr at 12/29/20 0810     Scheduled Medications    meclizine  25 mg Oral TID    amLODIPine  10 mg Oral Daily    docusate sodium  100 mg Oral BID    mometasone-formoterol  2 puff Inhalation BID    levothyroxine  50 mcg Oral Daily    pantoprazole  40 mg Oral QAM AC    pravastatin  40 mg Oral Nightly    lisinopril  20 mg Oral Daily    sodium chloride flush  5-40 mL IntraVENous 2 times per day    enoxaparin  40 mg SubCUTAneous Daily     PRN Meds: albuterol sulfate HFA, sodium chloride flush, sodium chloride, ondansetron **OR** ondansetron, polyethylene glycol, acetaminophen **OR** acetaminophen, perflutren lipid microspheres    No intake or output data in the 24 hours ending 12/29/20 1533    Physical Exam Performed:    BP 118/76    Pulse 66     Temp 97.6 ??F (36.4 ??C) (Oral)    Resp 16    Ht 5\' 2"  (1.575 m)    Wt 150 lb (68 kg)    SpO2 96%    BMI 27.44 kg/m??     General appearance: No apparent distress, appears stated age and cooperative.  HEENT: Pupils equal, round, and reactive to light. Conjunctivae/corneas clear.  Neck: Supple, with full range of motion. No jugular venous distention. Trachea midline.  Respiratory:  Normal respiratory effort. Clear to auscultation, bilaterally without Rales/Wheezes/Rhonchi.  Cardiovascular: Regular rate and rhythm with normal S1/S2 without murmurs, rubs or gallops.  Abdomen: Soft, non-tender, non-distended with normal bowel sounds.  Musculoskeletal: No clubbing, cyanosis or edema bilaterally.  Full range of motion without deformity.  Skin: Skin color, texture, turgor normal.  No rashes or lesions.  Neurologic:  Neurovascularly intact without any focal sensory/motor deficits. Cranial nerves: II-XII intact, grossly non-focal.  Psychiatric: Alert and oriented, thought content appropriate, normal insight  Capillary Refill: Brisk,3 seconds, normal   Peripheral Pulses: +2 palpable, equal bilaterally       Labs:   Recent Labs  12/28/20  1622 12/29/20  0442   WBC 7.2 6.3   HGB 14.9 13.6   HCT 45.1 41.3   PLT 252 245     Recent Labs     12/28/20  1622 12/29/20  0442   NA 139 143   K 4.6 3.9   CL 105 108   CO2 19* 28   BUN 11 10   CREATININE 0.9 0.8   CALCIUM 9.6 9.0     Recent Labs     12/28/20  1622 12/29/20  0442   AST 49* 26   ALT 25 19   BILITOT 0.7 0.8   ALKPHOS 100 98     No results for input(s): INR in the last 72 hours.  Recent Labs     12/28/20  1622 12/28/20  2347 12/29/20  0442   CKTOTAL 294  --   --    TROPONINI <0.01 <0.01 <0.01       Urinalysis:      Lab Results   Component Value Date/Time    NITRU Negative 12/28/2020 05:00 PM    BLOODU Negative 12/28/2020 05:00 PM    SPECGRAV 1.010 12/28/2020 05:00 PM    GLUCOSEU Negative 12/28/2020 05:00 PM       Radiology:  MRI BRAIN WO CONTRAST   Final Result   No acute  infarct.      RECOMMENDATIONS:   Unavailable         XR CHEST PORTABLE   Final Result   No acute process.      Bibasilar hypoaeration         CT Head WO Contrast   Final Result      1.  No acute intracranial abnormality noted.      2.  Mild left sphenoid and right ethmoid sinusitis.                 Assessment/Plan:    Active Hospital Problems    Diagnosis     Syncope and collapse [R55]      Priority: Medium       Syncope r/o possible seizures:  CT head, MRI and echo unremarkable.  No events on telemetry.  Troponin negative x 3.  Orthostatic vitals negative.  Neurology consult given symptoms exacerbated by noise and light.  EEG ordered.       Hypertension: BP borderline.  Continue amlodipine and lisinopril.  Hold HCTZ.       Hyperlipidemia: Continue statin.       Hypothyroid: Continue synthroid.  Follow-up TSH.      DVT Prophylaxis: Lovenox  Diet: ADULT DIET; Regular  Code Status: Full Code    PT/OT Eval Status: Home with HH PT    Dispo -once medically stable    Dwyane Luo, MD

## 2020-12-29 NOTE — Progress Notes (Addendum)
[facility] - Inpatient Rehabilitation Department   Phone: [phone]    Occupational Therapy    [x]  Initial Evaluation            []  Daily Treatment Note         []  Discharge Summary      Patient: Michael Knight   DOB: January 13, 1956   MRN:   Date of Service:  12/29/2020    Admitting Diagnosis:  Syncope and collapse  Current Admission Summary: 65 y.o. male with hx HTN, Hypothyroid, HLD, and asthma.  Patient presents to ER for syncope.  Patient is Nepali speaking and interpretor was used. He states he was walking around outside when he felt dizzy and lightheaded and fell.  He is unsure if he lost consciousness however per ER nurse upon arrival to ER he was only responding to painful stimuli.  Currently he is awake and alert.  He just returned from the bathroom and denied any dizziness with ambulating.  He did have some Shortness of breath prior to episode and states he was going to get his inhaler but did not make it.  He denies any recent illness this week or new medications.  He reports he has been eating and drinking ok this week.  However family reports he does not drink much water.  Past Medical History:  has a past medical history of Asthma, Back pain, Dyslipidemia, GERD (gastroesophageal reflux disease), Hyperlipidemia, Hypertension, and Hypothyroidism.  Past Surgical History:  has a past surgical history that includes Upper gastrointestinal endoscopy (N/A, 02/08/2018); Hand surgery; Elbow surgery (Right); and bronchoscopy (N/A, 10/28/2020).    Discharge Recommendations: Dicky Boer scored a 19/24 on the AM-PAC ADL Inpatient form. Current research shows that an AM-PAC score of 18 or greater is typically associated with a discharge to the patient's home setting. Based on the patient's AM-PAC score, and their current ADL deficits, it is recommended that the patient have 2-3 sessions per week of Occupational Therapy at d/c to increase the patient's independence.  At this time, this patient demonstrates the  endurance and safety to discharge home with home services (home vs OP services) and a follow up treatment frequency of 2-3x/wk.   Please see assessment section for further patient specific details.    If patient discharges prior to next session this note will serve as a discharge summary.  Please see below for the latest assessment towards goals.     HOME HEALTH CARE: LEVEL 1 STANDARD    - Initial home health evaluation to occur within 24-48 hours, in patient home   - Therapy to evaluate with goal of regaining prior level of functioning   - Therapy to evaluate if patient has Home Health Aide needs for personal care      DME Required For Discharge: no DME required at discharge    Precautions/Restrictions: high fall risk    Pre-Admission Information   Lives With: family, son Sometimes home alone after 3pm   Type of Home: house  Home Layout: two level, .  Comment: split level  Home Access:  6 step to enter with handrail.  Handrails are located on B side.  Bathroom Layout: [name] Height: standard height  Bathroom Equipment: grab bars in shower, shower chair  Home Equipment: single point cane, RW  Transfer Assistance: Independent without use of device  Ambulation Assistance:modified independent with use of cane  ADL Assistance: requires assistance with bathing, requires assistance with dressing  IADL Assistance: requires assistance with all  homemaking tasks  Active Driver:        []  Yes  [x]  No  Hobbies:   Recent Falls: 2 falls in last six months with one fall leading to this admission    Examination   Vision:   Vision Gross Assessment: Impaired and Vision Corrective Device: wears glasses at all times  Hearing:   [facility]  Perception:   WFL  Observation:   General Observation:  RUE tremor from recent UE nerve sx  Sensation:   reports numbness and tingling in (R) UE hand from recent sx  Coordination Testing:   Coordination and Movement Description: (R) UE, resting tremors, decreased accuracy      ROM:   (B) UE  AROM WFL  Strength:   LUE WFL, RUE grossly 3+/5    Decision Making: low complexity  Clinical Presentation: stable      Subjective  General: Pt lying upright in bed upon arrival, pleasant and agreeable to evaluation, denies pain  Pain: 0/10  Pain Interventions: not applicable        Activities of Daily Living  Basic Activities of Daily Living  Grooming: supervision  Toileting: supervision.  Equipment: none  General Comments: declines additional ADL  Instrumental Activities of Daily Living  No IADL completed on this date.    Functional Mobility  Bed Mobility  Supine to Sit: supervision  Sit to Supine: supervision  Comments:  Transfers  Sit to stand transfer:stand by assistance  Stand to sit transfer: stand by assistance  Comments:  Functional Mobility:  Sitting Balance: supervision.    Standing Balance: stand by assistance.    Functional Mobility: no device.  stand by assistance.  Activity: to/from bathroom, hallway amb 80'. Provided with RW once back to room d/t cramping in LE, tends to pick up RW    Other Therapeutic Interventions    Functional Outcomes  AM-PAC Inpatient Daily Activity Raw Score: 19    Cognition  WFL  Comments: Per observation d/t language barrier  Orientation:    Did not assess anticipate [facility]  Command Following:   [facility]     Education  Barriers To Learning: language  Patient Education: patient educated on goals, OT role and benefits, plan of care, transfer training, discharge recommendations  Learning Assessment:  patient verbalizes and demonstrates understanding, patient verbalizes understanding, would benefit from continued reinforcement    Assessment  Activity Tolerance: Tolerated fair, reports migraine d/t lights from hallway. Orthostatics assessed: Supine 137/83, sitting EOB 149/82, standing 150/80, post ambulation 152/84   Impairments Requiring Therapeutic Intervention: decreased functional mobility, decreased ADL status, decreased strength, decreased endurance, decreased sensation, decreased  balance  Prognosis: good  Clinical Assessment: Pt presents slightly below baseline d/t above deficits, continued OT indicated to promote return to PLOF  Safety Interventions: patient left in bed, bed alarm in place, call light within reach, gait belt, and nurse notified    Plan  Frequency: 1-2 x/per week  Current Treatment Recommendations: strengthening, balance training, functional mobility training, transfer training, endurance training, patient/caregiver education, ADL/self-care training, IADL training, and home management training    Goals     Short Term Goals:  Time Frame: by discharge  Patient will complete upper body ADL at supervision   Patient will complete lower body ADL at stand by assistance   Patient will complete toileting at modified independent   Patient will complete grooming at Independent   Patient will complete functional transfers at Independent   Patient will complete functional mobility at modified independent  Therapy Session Time     Individual Group Co-treatment   Time In    1050   Time Out    1133   Minutes    43        Timed Code Treatment Minutes:   28 minutes  Total Treatment Minutes:  43 minutes       Electronically Signed By: Carren Rang, OT  Carren Rang, MOT OTR/L [phone]

## 2020-12-29 NOTE — Progress Notes (Signed)
Physical Therapy    [facility] - Inpatient Rehabilitation Department   Phone: [phone]    Physical Therapy    [x]  Initial Evaluation            []  Daily Treatment Note         []  Discharge Summary      Patient: Michael Knight   DOB: 13-Sep-1955   MRN:   Date of Service:  12/29/2020  Admitting Diagnosis: Syncope and collapse  Current Admission Summary: Michael Knight is a 65 y.o. male who presents to the emergency department after passing out.  This is a 65 year old Michael Knight male who presents after a witnessed syncopal episode and fall at his house.  He denies any chest pain.  He denies abdominal pain.  Apparently, he was out in the hot weather when this happened.  The family also states he does not drink enough water.  Past Medical History:  has a past medical history of Asthma, Back pain, Dyslipidemia, GERD (gastroesophageal reflux disease), Hyperlipidemia, Hypertension, and Hypothyroidism.  Past Surgical History:  has a past surgical history that includes Upper gastrointestinal endoscopy (N/A, 02/08/2018); Hand surgery; Elbow surgery (Right); and bronchoscopy (N/A, 10/28/2020).  Discharge Recommendations: Michael Knight scored a 21/24 on the AM-PAC short mobility form. Current research shows that an AM-PAC score of 18 or greater is typically associated with a discharge to the patient's home setting. Based on the patient's AM-PAC score and their current functional mobility deficits, it is recommended that the patient have 2-3 sessions per week of Physical Therapy at d/c to increase the patient's independence.  At this time, this patient demonstrates the endurance and safety to discharge home with HHPT and a follow up treatment frequency of 2-3x/wk.  Please see assessment section for further patient specific details.    If patient discharges prior to next session this note will serve as a discharge summary.  Please see below for the latest assessment towards goals.    DME Required For Discharge: no DME required at  discharge  Precautions/Restrictions: high fall risk  Weight Bearing Restrictions: no restrictions  []  Right Upper Extremity  []  Left Upper Extremity []  Right Lower Extremity  []  Left Lower Extremity     Required Braces/Orthotics: no braces required   []  Right  []  Left  Positional Restrictions:no positional restrictions    Pre-Admission Information   Lives With: son          home alone after 3:00pm             Type of Home: house  Home Layout: two level  Home Access:  6 step to enter with handrail.  Handrails are located on B side.  Bathroom Layout: 10/30/2020 Height: standard height  Bathroom Equipment: grab bars in shower, shower chair  Home Equipment: single point cane, RW   Transfer Assistance: Independent without use of device  Ambulation Assistance:modified independent with use of cane  ADL Assistance: requires assistance with bathing, requires assistance with dressing  IADL Assistance: requires assistance with all homemaking tasks  Active Driver:        []  Yes                 [x]  No  Hobbies:   Recent Falls: 2 falls in last six months with one fall leading to this admission    Examination   Vision:   Vision Gross Assessment: Impaired and Vision Corrective Device: wears glasses at all times  Hearing:   [facility]  Observation:   General Observation:  R hand tremor from recent UE nerve surgery  Posture:   WFL   Sensation:   Reports numbness on R hand due to recent nerve surgery     ROM:   (B) LE AROM WFL  Strength:   (B) LE strength grossly WFL  Decision Making: low complexity  Clinical Presentation: stable      Subjective  General: Pt supine in bed upon arrival. Pt agreeable to PT/OT eval. Online Nepali translator used throughout evaluationA  Pain: 0/10  Pain Interventions: not applicable       Functional Mobility  Bed Mobility  Supine to Sit: supervision  Sit to Supine: supervision  Comments:  Transfers  Sit to stand transfer: stand by assistance  Stand to sit transfer: stand by  assistance  Comments:  Ambulation  Surface:level surface  Assistive Device: no device  Assistance: stand by assistance  Distance: 4ft   Gait Mechanics: steady, no LOB, decreased cadence, decreased step length, decreased step height   Comments:  Pt ambulates 80 ft in hallways before asking to go back to room because hallway noise and lights give him a migraine and make him feel dizzy     Surface:level surface  Assistive Device: RW  Assistance: stand by assistance  Distance:15 ft   Gait Mechanics: cues to keep RW close  Comments: Pt given RW to ambulate to restroom because he reported his R LE was cramping, nursing notified   Stair Mobility  Stair mobility not completed on this date.  Comments:  Wheelchair Mobility:  No w/c mobility completed on this date.  Comments:  Balance  Static Sitting Balance: good(+): independent with high level dynamic balance in unsupported position  Dynamic Sitting Balance: good(+): independent with high level dynamic balance in unsupported position  Static Standing Balance: fair (+): maintains balance at SBA/supervision without use of UE support  Dynamic Standing Balance: fair (+): maintains balance at SBA/supervision without use of UE support  Comments: Pt stands at toilet to urinate with supervision. Pt washes hands a sink with supervision     Other Therapeutic Interventions    Functional Outcomes  AM-PAC Inpatient Mobility Raw Score : 21              Cognition  WFL  Orientation:    Did not assess   Command Following:   WFL    Education  Barriers To Learning: language  Patient Education: patient educated on goals, PT role and benefits, plan of care, discharge recommendations  Learning Assessment:  patient verbalizes and demonstrates understanding    Assessment  Activity Tolerance: Pt has difficulty ambulating in hallway due to lights and noise, he reports it gives him a HA and makes him feel dizzy. Pt reports cramp in R calve post ambulation, nursing notified. Orthostatics assessed:  Supine 137/83, sitting EOB 149/82, standing 150/80, post ambulation 152/84   Impairments Requiring Therapeutic Intervention: decreased endurance, decreased sensation, decreased balance, increased pain  Prognosis: good  Clinical Assessment: Pt presents to hospital with syncope/collapse and witnessed fall. At this time, pt completes functional transfers and ambulation with SBA however activity tolerance limited due to pt experiencing a migraine from hallway lights and noise. Pt has hx of falls at home and would continue to benefit from skilled PT in order to prevent falls and safely return to PLOF.   Safety Interventions: patient left in bed, bed alarm in place, call light within reach, gait belt, patient at risk for falls, and nurse notified  Plan  Frequency: 1-2 x/per week  Current Treatment Recommendations: strengthening, balance training, functional mobility training, transfer training, and gait training    Goals  Patient Goals: none stated    Short Term Goals:  Time Frame: upon discharge   Patient will complete bed mobility at Independent   Patient will complete transfers at Independent   Patient will ambulate 150 ft with use of LRAD at modified independent  Patient will ascend/descend 6 stairs with (B) handrail at modified independent    Therapy Session Time      Individual Group Co-treatment   Time In     1050   Time Out     1133   Minutes     43     Timed Code Treatment Minutes:   28  Total Treatment Minutes:  43       Electronically Signed By: Charna Busman, PT  Joylene Grapes, PT, DPT [phone]

## 2020-12-29 NOTE — Care Coordination-Inpatient (Signed)
12/29/20 1558   Service Assessment   Patient Orientation Alert and Oriented   Cognition Alert   History Provided By Patient   PCP Verified by CM Yes   Last Visit to PCP Within last 6 months   Prior Functional Level Independent in ADLs/IADLs   Social/Functional History   Lives With Family   Type of Home House   Home Layout Two level   Home Access Stairs to enter with rails   Entrance Stairs - Number of Steps 6   Bathroom Equipment Shower chair   Home Equipment Cane;Walker, rolling;Grab bars   Services At/After Discharge   Services At/After Discharge Home Health  (Patient was recommended HHC.  Patient deferred to dtr, Menna, for the decision.  Called dtr who was agreeable to this service.  Patient has never had this service before.  She did not have a preference in [facility] agencies. Referred to [facility] who accepted)   Confirm Follow Up Transport Family   Condition of Participation: Discharge Planning   The Patient and/or Patient Representative was provided with a Choice of Provider? Patient   The Patient and/Or Patient Representative agree with the Discharge Plan? Yes   Freedom of Choice list was provided with basic dialogue that supports the patient's individualized plan of care/goals, treatment preferences, and shares the quality data associated with the providers?  Yes     [facility]  Phone [phone]  Fax [phone]    Electronically signed by Lars Masson, MSW, LSW on 12/29/2020 at 4:01 PM

## 2020-12-30 DIAGNOSIS — R55 Syncope and collapse: Secondary | ICD-10-CM

## 2020-12-30 LAB — COMPREHENSIVE METABOLIC PANEL
ALT: 17 U/L (ref 10–40)
AST: 20 U/L (ref 15–37)
Albumin/Globulin Ratio: 1.7 (ref 1.1–2.2)
Albumin: 4 g/dL (ref 3.4–5.0)
Alkaline Phosphatase: 91 U/L (ref 40–129)
Anion Gap: 6 (ref 3–16)
BUN: 8 mg/dL (ref 7–20)
CO2: 27 mmol/L (ref 21–32)
Calcium: 8.9 mg/dL (ref 8.3–10.6)
Chloride: 107 mmol/L (ref 99–110)
Creatinine: 0.8 mg/dL (ref 0.8–1.3)
GFR African American: >60 (ref >60)
GFR Non-African American: >60 (ref >60)
Glucose: 98 mg/dL (ref 70–99)
Potassium: 4.1 mmol/L (ref 3.5–5.1)
Sodium: 140 mmol/L (ref 136–145)
Total Bilirubin: 0.6 mg/dL (ref 0.0–1.0)
Total Protein: 6.4 g/dL (ref 6.4–8.2)

## 2020-12-30 LAB — LACTIC ACID: Lactic Acid: 1.4 mmol/L (ref 0.4–2.0)

## 2020-12-30 LAB — CBC WITH AUTO DIFFERENTIAL
Basophils %: 0.7 %
Basophils Absolute: 0 10*3/uL (ref 0.0–0.2)
Eosinophils %: 1.2 %
Eosinophils Absolute: 0.1 10*3/uL (ref 0.0–0.6)
Hematocrit: 42 % (ref 40.5–52.5)
Hemoglobin: 13.7 g/dL (ref 13.5–17.5)
Lymphocytes %: 22.7 %
Lymphocytes Absolute: 1.3 10*3/uL (ref 1.0–5.1)
MCH: 28.5 pg (ref 26.0–34.0)
MCHC: 32.6 g/dL (ref 31.0–36.0)
MCV: 87.5 fL (ref 80.0–100.0)
MPV: 7.8 fL (ref 5.0–10.5)
Monocytes %: 8.6 %
Monocytes Absolute: 0.5 10*3/uL (ref 0.0–1.3)
Neutrophils %: 66.8 %
Neutrophils Absolute: 3.8 10*3/uL (ref 1.7–7.7)
Platelets: 243 10*3/uL (ref 135–450)
RBC: 4.8 M/uL (ref 4.20–5.90)
RDW: 14.6 % (ref 12.4–15.4)
WBC: 5.6 10*3/uL (ref 4.0–11.0)

## 2020-12-30 LAB — PHOSPHORUS: Phosphorus: 3.3 mg/dL (ref 2.5–4.9)

## 2020-12-30 LAB — TSH WITH REFLEX TO FT4: TSH Reflex FT4: 3.48 u[IU]/mL (ref 0.27–4.20)

## 2020-12-30 LAB — MAGNESIUM: Magnesium: 2.2 mg/dL (ref 1.80–2.40)

## 2020-12-30 MED ORDER — LISINOPRIL 20 MG PO TABS
20 MG | ORAL_TABLET | Freq: Every day | ORAL | 3 refills | Status: DC
Start: 2020-12-30 — End: 2020-12-30

## 2020-12-30 MED ORDER — AMLODIPINE BESYLATE 5 MG PO TABS
5 MG | Freq: Every day | ORAL | Status: DC
Start: 2020-12-30 — End: 2020-12-31

## 2020-12-30 MED ORDER — SUMATRIPTAN SUCCINATE 25 MG PO TABS
25 MG | Freq: Once | ORAL | Status: DC
Start: 2020-12-30 — End: 2020-12-31

## 2020-12-30 MED ORDER — ONDANSETRON 4 MG PO TBDP
4 MG | ORAL_TABLET | Freq: Two times a day (BID) | ORAL | 0 refills | Status: DC | PRN
Start: 2020-12-30 — End: 2021-01-18

## 2020-12-30 MED ORDER — LISINOPRIL 10 MG PO TABS
10 MG | ORAL_TABLET | Freq: Every day | ORAL | 0 refills | Status: DC
Start: 2020-12-30 — End: 2021-01-18

## 2020-12-30 MED FILL — PANTOPRAZOLE SODIUM 40 MG PO TBEC: 40 mg | ORAL | Qty: 1

## 2020-12-30 MED FILL — MAPAP 325 MG PO TABS: 325 mg | ORAL | Qty: 2

## 2020-12-30 MED FILL — AMLODIPINE BESYLATE 5 MG PO TABS: 5 mg | ORAL | Qty: 2

## 2020-12-30 MED FILL — DOCUSATE SODIUM 100 MG PO CAPS: 100 mg | ORAL | Qty: 1

## 2020-12-30 MED FILL — PRAVASTATIN SODIUM 40 MG PO TABS: 40 mg | ORAL | Qty: 1

## 2020-12-30 MED FILL — LISINOPRIL 20 MG PO TABS: 20 mg | ORAL | Qty: 1

## 2020-12-30 MED FILL — SUMATRIPTAN SUCCINATE 25 MG PO TABS: 25 mg | ORAL | Qty: 2

## 2020-12-30 MED FILL — LEVOTHYROXINE SODIUM 25 MCG PO TABS: 25 ug | ORAL | Qty: 2

## 2020-12-30 MED FILL — LOVENOX 40 MG/0.4ML IJ SOSY: 40 MG/0.4ML | INTRAMUSCULAR | Qty: 0.4

## 2020-12-30 NOTE — Discharge Instructions (Signed)
Do not resume HCTZ until seen by PCP outpatient.  Monitor blood pressures closely at home.

## 2020-12-30 NOTE — Procedures (Signed)
Patient: Michael Knight    MR Number: [id number]  Date of Birth: 11/14/1955  Date of Visit: 12/30/2020    Clinical History:  The patient is a 65 y.o. years old male with new onset syncope.      Method:  The EEG was performed utilizing the international 10/20 of electrode placements of both referential and bipolar montages. The patient was awake and drowsy through out the recording.  Photic stimulation was performed.    Findings:  The background of the EEG showed normal alpha posterior background of 9-10- HZ and amplitude of 20-40 UV. This background was symmetric, waxing and waning, and reactive with eye opening and closure. As the patient became drowsy, generalized diffuse slowing was seen through recording at 6-7 HZ.  This generalized slowing was symmetric, non rhythmical, and continuous. No spike or sharp waves were seen.  Photic stimulation did not activate EEG.     Impression:  This EEG  is within normal limits. There is no evidence of epileptiform discharges, focal, or lateralizing abnormalities.      Otelia Sergeant, MD      Board certified in clinical neurophysiology

## 2020-12-30 NOTE — Discharge Summary (Signed)
[facility] DISCHARGE SUMMARY    Patient Demographics    Patient. Michael Knight  Date of Birth. 12/22/1955  MRN. [id number]     Primary care provider. Ulice Bold, APRN - CNP  (Tel: [phone])    Admit date: 12/28/2020    Discharge date (blank if same as Note Date):   Note Date: 12/30/2020     Reason for Hospitalization.   Chief Complaint   Patient presents with    Loss of Consciousness     Arrived via [facility] from home d/t witnessed fall with LOC; responds to pain; last hospital visit "recent"         Significant Findings.   Principal Problem:    Syncope and collapse  Active Problems:    HTN (hypertension), benign    Dyslipidemia  Resolved Problems:    * No resolved hospital problems. *       Problems and results from this hospitalization that need follow up.  Hypertension  Syncope    Significant test results and incidental findings.    MRI BRAIN WO CONTRAST   Final Result   No acute infarct.      RECOMMENDATIONS:   Unavailable         XR CHEST PORTABLE   Final Result   No acute process.      Bibasilar hypoaeration         CT Head WO Contrast   Final Result      1.  No acute intracranial abnormality noted.      2.  Mild left sphenoid and right ethmoid sinusitis.           Invasive procedures and treatments.   None     [facility] Course.  Michael Knight is a 65 y.o. male with hx HTN, Hypothyroid, HLD, and asthma.  Patient presented to ER for syncope. He stated he was walking around outside when he felt dizzy and lightheaded and fell.  He was unsure if he lost consciousness however per ER nurse upon arrival to ER he was only responding to painful stimuli. He did have some Shortness of breath prior to episode and stated he was going to get his inhaler but did not make it. He denied any recent illness this week or new medications.      Syncope and collapse:  CT head, MRI and echo unremarkable.  No events on telemetry.  Troponin negative x 3.  Orthostatic vitals negative.  Neurology  consulted recommended [facility] on discharge.  EEG 12/30/20: Within normal limits.        Hypertension: BP borderline low.  Continued amlodipine & lisinopril.  HCTZ discontinued.  Advised to monitor blood pressures at home.     Hyperlipidemia: Continued statin.        Hypothyroid: Continued synthroid. TSH wnl.      Consults.  IP CONSULT TO NEUROLOGY  IP CONSULT TO HOME CARE NEEDS    Physical examination on discharge day.   BP (!) 140/85    Pulse 66    Temp 97 ??F (36.1 ??C) (Oral)    Resp 16    Ht 5\' 2"  (1.575 m)    Wt 150 lb (68 kg)    SpO2 100%    BMI 27.44 kg/m??   General appearance.  Alert. Looks comfortable.  HEENT. Sclera clear. Moist mucus membranes.  Cardiovascular. Regular rate and rhythm, normal S1, S2. No murmur.   Respiratory. Not using accessory muscles.Clear to auscultation bilaterally, no wheeze.  Gastrointestinal. Abdomen soft,  non-tender, not distended, normal bowel sounds  Neurology. Facial symmetry. No speech deficits. Moving all extremities equally.  Extremities. No edema in lower extremities.  Skin. Warm, dry, normal turgor      Condition at time of discharge : stable    Medication instructions provided to patient at discharge.     Medication List        CHANGE how you take these medications      lisinopril 10 MG tablet  Commonly known as: PRINIVIL;ZESTRIL  Take 1 tablet by mouth in the morning.  What changed:   medication strength  how much to take     pantoprazole 40 MG tablet  Commonly known as: PROTONIX  Take 1 tablet by mouth every morning (before breakfast)  What changed: Another medication with the same name was removed. Continue taking this medication, and follow the directions you see here.            CONTINUE taking these medications      acetaminophen 500 MG tablet  Commonly known as: TYLENOL     albuterol sulfate HFA 108 (90 Base) MCG/ACT inhaler  Commonly known as: Ventolin HFA  Inhale 2 puffs into the lungs 4 times daily as needed for Wheezing     amLODIPine 10 MG tablet  Commonly known  as: NORVASC     benzonatate 100 MG capsule  Commonly known as: TESSALON     cetirizine 10 MG tablet  Commonly known as: ZYRTEC     docusate sodium 100 MG capsule  Commonly known as: Colace  Take 1 capsule by mouth 2 times daily     fluticasone-salmeterol 100-50 MCG/DOSE diskus inhaler  Commonly known as: ADVAIR     GABAPENTIN PO     hydrOXYzine pamoate 25 MG capsule  Commonly known as: Vistaril  Take 1 capsule by mouth 3 times daily as needed (anxiety and congestion)     levothyroxine 50 MCG tablet  Commonly known as: SYNTHROID     montelukast 10 MG tablet  Commonly known as: SINGULAIR     ondansetron 4 MG disintegrating tablet  Commonly known as: Zofran ODT  Take 1-2 tablets by mouth every 12 hours as needed for Nausea May substitute the non ODT tablets if not covered financially by the insurance plan.     polyethylene glycol 17 g packet  Commonly known as: GLYCOLAX     pravastatin 40 MG tablet  Commonly known as: PRAVACHOL     promethazine-codeine 6.25-10 MG/5ML syrup  Commonly known as: PHENERGAN with CODEINE     SUMAtriptan 50 MG tablet  Commonly known as: IMITREX     traZODone 50 MG tablet  Commonly known as: DESYREL            STOP taking these medications      hydroCHLOROthiazide 12.5 MG capsule  Commonly known as: MICROZIDE     hydrOXYzine HCl 10 MG tablet  Commonly known as: ATARAX     ondansetron 4 MG tablet  Commonly known as: Zofran               Where to Get Your Medications        These medications were sent to [facility] [id number], [place] - [address] - P [phone] - F [phone]  [address], [place] [address]      Phone: [phone]   lisinopril 10 MG tablet       Information about where to get these medications is not yet  available    Ask your nurse or doctor about these medications  ondansetron 4 MG disintegrating tablet         Discharge recommendations given to patient.  Follow Up. With PCP in 1 week   Disposition.  Home with HHPT  Activity. activity as  tolerated  Diet: ADULT DIET; Regular      Spent 35 minutes in discharge process.    Signed:  Dwyane Luo, MD     12/30/2020 3:03 PM

## 2020-12-30 NOTE — Care Coordination-Inpatient (Signed)
Discharge Planning:    Case Manager (CM) called and informed [facility]) staff, Rosanne Ashing [phone]), of patient's discharge today.      Dory Larsen MSW, LISW-S, [facility]  Social Work - Sports coach  [phone]    Electronically signed by Dory Larsen, MSW on 12/30/2020 at 3:21 PM

## 2020-12-30 NOTE — Plan of Care (Signed)
Problem: Discharge Planning  Goal: Discharge to home or other facility with appropriate resources  Outcome: Progressing  Flowsheets (Taken 12/30/2020 1027)  Discharge to home or other facility with appropriate resources:   Identify barriers to discharge with patient and caregiver   Arrange for needed discharge resources and transportation as appropriate   Identify discharge learning needs (meds, wound care, etc)   Arrange for interpreters to assist at discharge as needed   Refer to discharge planning if patient needs post-hospital services based on physician order or complex needs related to functional status, cognitive ability or social support system     Problem: Pain  Goal: Verbalizes/displays adequate comfort level or baseline comfort level  Outcome: Progressing     Problem: Safety - Adult  Goal: Free from fall injury  Outcome: Progressing

## 2020-12-30 NOTE — Discharge Instructions (Addendum)
Continuity of Care Form    Patient Name: Michael Knight   DOB:  March 28, 1956  MRN:  [id number]    Admit date:  12/28/2020  Discharge date:  12/30/20      Code Status Order: Full Code   Advance Directives:     Admitting Physician:  Michael Stable, MD  PCP: Michael Bold, APRN - CNP    Discharging Nurse: Michael Knight Unit/Room#: 3AN-3304/3304-01  Discharging Unit Phone Number: [phone]    Emergency Contact:   Extended Emergency Contact Information  Primary Emergency Contact: Michael Knight  Home Phone: [phone]  Relation: Child  Secondary Emergency Contact: Michael Knight  Home Phone: [phone]  Relation: Child    Past Surgical History:  Past Surgical History:   Procedure Laterality Date    BRONCHOSCOPY N/A 10/28/2020    BRONCHOSCOPY ALVEOLAR LAVAGE performed by Michael Sellar, MD at [facility]    ELBOW SURGERY Right     HAND SURGERY      UPPER GASTROINTESTINAL ENDOSCOPY N/A 02/08/2018    EGD BIOPSY performed by Michael Robinsons, MD at [facility]       Immunization History:   Immunization History   Administered Date(s) Administered    COVID-19, PFIZER PURPLE top, DILUTE for use, (age 29 y+), 91mcg/0.3mL 08/25/2019, 09/15/2019, 06/09/2020    Influenza, Pilar Jarvis, Recombinant, IM PF (Flublok 18 yrs and older) 05/09/2019    Influenza, Triv, inactivated, subunit, adjuvanted, IM (Fluad 65 yrs and older) 04/13/2016       Active Problems:  Patient Active Problem List   Diagnosis Code    Chest pain R07.9    Chronic cough R05.3    Generalized abdominal pain R10.84    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    Syncope and collapse R55    HTN (hypertension), benign I10    Dyslipidemia E78.5       Isolation/Infection:   Isolation            No Isolation          Patient Infection Status       Infection Onset Added Last Indicated Last Indicated By Review Planned Expiration Resolved Resolved By    None active    Resolved    COVID-19 (Rule Out) 09/09/20 09/09/20 09/09/20 COVID-19 (Ordered)   09/10/20  Rule-Out Test Resulted            Nurse Assessment:  Last Vital Signs: BP (!) 140/85    Pulse 66    Temp 97 ??F (36.1 ??C) (Oral)    Resp 16    Ht 5\' 2"  (1.575 m)    Wt 150 lb (68 kg)    SpO2 100%    BMI 27.44 kg/m??     Last documented pain score (0-10 scale): Pain Level: 4  Last Weight:   Wt Readings from Last 1 Encounters:   12/28/20 150 lb (68 kg)     Mental Status:  oriented, alert, coherent, and logical    IV Access:  - None    Nursing Mobility/ADLs:  Walking   Independent  Transfer  Independent  Bathing  Independent  Dressing  Independent  Toileting  Independent  Feeding  Independent  Med Admin  Independent  Med Delivery   none    Wound Care Documentation and Therapy:        Elimination:  Continence:   Bowel: No  Bladder: No  Urinary Catheter: None   Colostomy/Ileostomy/Ileal Conduit: No       Date of  Last BM: 12/30/20    Intake/Output Summary (Last 24 hours) at 12/30/2020 1432  Last data filed at 12/30/2020 0415  Gross per 24 hour   Intake 480 ml   Output --   Net 480 ml     I/O last 3 completed shifts:  In: 480 [P.O.:480]  Out: -     Safety Concerns:     At Risk for Falls    Impairments/Disabilities:      Language Barrier - [place] speaking    Nutrition Therapy:  Current Nutrition Therapy:   - Oral Diet:  General    Routes of Feeding: Oral  Liquids: Thin Liquids  Daily Fluid Restriction: no  Last Modified Barium Swallow with Video (Video Swallowing Test): not done    Treatments at the Time of Knight Discharge:   Respiratory Treatments: ***  Oxygen Therapy:  is not on home oxygen therapy.  Ventilator:    - No ventilator support    Rehab Therapies: Physical Therapy and Occupational Therapy  Weight Bearing Status/Restrictions: No weight bearing restrictions  Other Medical Equipment (for information only, NOT a DME order):  ***  Other Treatments: ***    Patient's personal belongings (please select all that are sent with patient):  None    RN SIGNATURE:  Electronically signed by Michael Shorter, RN on 12/30/20 at 3:36 PM  EDT    CASE MANAGEMENT/SOCIAL WORK SECTION    Inpatient Status Date: 12/28/2020    Readmission Risk Assessment Score:  Readmission Risk              Risk of Unplanned Readmission:  0           Discharging to Facility/ Agency     [facility]  Phone [phone]  Fax [phone]      Case Manager/Social Worker signature: Electronically signed by Michael Knight, MSW on 12/30/2020 at 3:19 PM      PHYSICIAN SECTION    Prognosis: Good    Condition at Discharge: Knight    Rehab Potential (if transferring to Rehab): Good    Recommended Labs or Other Treatments After Discharge: Close monitoring of blood pressure to avoid hypotension.    Physician Certification: I certify the above information and transfer of Michael Knight  is necessary for the continuing treatment of the diagnosis listed and that he requires Home Care for greater 30 days.     Update Admission H&P: No change in H&P    PHYSICIAN SIGNATURE:  Electronically signed by Michael Luo, MD on 12/30/20 at 2:33 PM EDT

## 2020-12-30 NOTE — Progress Notes (Signed)
Data- discharge order received, pt verbalized agreement to discharge, needs for Home Health Care with [facility]  COC reviewed and signed by MD, to be completed by RN.    Action- AVS prepared, discharge instructions prepared and given to patient, medication information packet given r/t NEW or CHANGED prescriptions, pt verbalized understanding further self-review.   D/C instruction summary: Diet- heneral, Activity- up as tolerated, follow up with Primary Care Physician Ulice Bold, APRN - CNP [phone] appointment 1 week, immunizations reviewed, medications prescriptions to be filled pharmacy of choice.     Response- Case Management/Social Services reported faxing completed COC and AVS to needed HHC/DME services stated above.   Pt belongings gathered, IV removed, pt dressed. Disposition is home with HHC/DME as stated above, transported with family, taken to lobby via w/c with staff, no complications.     1. WEIGHT: Admit Weight: 150 lb (68 kg) (12/28/20 1603)        Today  Weight: 150 lb (68 kg) (12/28/20 1603)       2. O2 SAT.: SpO2: 100 % (12/30/20 1645)

## 2020-12-30 NOTE — Consults (Signed)
In patient Neurology consult        [facility] Neurology      Otelia Sergeant, MD      Michael Knight  1955/10/05    Date of Service: 12/30/2020    Referring Physician: Dwyane Luo, MD      Reason for the consult and CC: Recurrent syncope versus seizure    HPI:   The patient is a 65 y.o.  years old male with history of hypertension and hyperlipidemia who was admitted to the hospital yesterday with recurrent syncope.  Symptoms started 2 days ago before admission.  He described feeling dizzy and lightheaded and slowly passing out for at least several minutes.  Degree was severe.  No witnessed convulsion, tongue biting or bladder incontinence.  No postictal history.  He had 2 similar episode in the last 3 months.  No clear triggers, relieving or aggravating factors.  Other associated symptoms include sensitivity to noise and light whenever he feels presyncopal or dizzy.  He denies any family history of epilepsy or history of seizure in the past.  No recent change in medications or fever or chills.  No other other associated symptoms, tongue biting or bladder incontinence.  Today he denies any new symptoms.  Initial work-up with MRI brain showed no acute findings.  He is scheduled for EEG.  Other review of system was unremarkable    Family history: Reviewed with the patient.  No family history of epilepsy        Past Surgical History:   Procedure Laterality Date    BRONCHOSCOPY N/A 10/28/2020    BRONCHOSCOPY ALVEOLAR LAVAGE performed by Dulce Sellar, MD at [facility]    ELBOW SURGERY Right     HAND SURGERY      UPPER GASTROINTESTINAL ENDOSCOPY N/A 02/08/2018    EGD BIOPSY performed by Kandice Robinsons, MD at [facility] END[facility]        Past Medical History:   Diagnosis Date    Asthma     Back pain     Dyslipidemia     GERD (gastroesophageal reflux disease)     Hyperlipidemia     Hypertension     Hypothyroidism      Social History     Tobacco Use    Smoking status: Former    Smokeless tobacco: Never   Vaping  Use    Vaping Use: Never used   Substance Use Topics    Alcohol use: Not Currently    Drug use: Never     No Known Allergies  Current Facility-Administered Medications   Medication Dose Route Frequency Provider Last Rate Last Admin    meclizine (ANTIVERT) tablet 25 mg  25 mg Oral TID PRN Dwyane Luo, MD        albuterol sulfate HFA (PROVENTIL;VENTOLIN;PROAIR) 108 (90 Base) MCG/ACT inhaler 2 puff  2 puff Inhalation 4x Daily PRN Natosha Minick, APRN - CNP        amLODIPine (NORVASC) tablet 10 mg  10 mg Oral Daily Natosha Minick, APRN - CNP   10 mg at 12/29/20 0804    docusate sodium (COLACE) capsule 100 mg  100 mg Oral BID Natosha Minick, APRN - CNP   100 mg at 12/30/20 0811    mometasone-formoterol (DULERA) 100-5 MCG/ACT inhaler 2 puff  2 puff Inhalation BID Natosha Minick, APRN - CNP   2 puff at 12/30/20 0957    levothyroxine (SYNTHROID) tablet 50 mcg  50 mcg Oral Daily Natosha Minick, APRN - CNP  50 mcg at 12/30/20 0601    pantoprazole (PROTONIX) tablet 40 mg  40 mg Oral QAM AC Natosha Minick, APRN - CNP   40 mg at 12/30/20 0601    pravastatin (PRAVACHOL) tablet 40 mg  40 mg Oral Nightly Natosha Minick, APRN - CNP   40 mg at 12/29/20 2013    lisinopril (PRINIVIL;ZESTRIL) tablet 20 mg  20 mg Oral Daily Natosha Minick, APRN - CNP   20 mg at 12/29/20 0804    sodium chloride flush 0.9 % injection 5-40 mL  5-40 mL IntraVENous 2 times per day Natosha Minick, APRN - CNP   10 mL at 12/30/20 0815    sodium chloride flush 0.9 % injection 5-40 mL  5-40 mL IntraVENous PRN Natosha Minick, APRN - CNP        0.9 % sodium chloride infusion   IntraVENous PRN Natosha Minick, APRN - CNP        enoxaparin (LOVENOX) injection 40 mg  40 mg SubCUTAneous Daily Natosha Minick, APRN - CNP   40 mg at 12/30/20 0808    ondansetron (ZOFRAN-ODT) disintegrating tablet 4 mg  4 mg Oral Q8H PRN Natosha Minick, APRN - CNP        Or    ondansetron (ZOFRAN) injection 4 mg  4 mg IntraVENous Q6H PRN Natosha Minick, APRN - CNP        polyethylene  glycol (GLYCOLAX) packet 17 g  17 g Oral Daily PRN Natosha Minick, APRN - CNP        acetaminophen (TYLENOL) tablet 650 mg  650 mg Oral Q6H PRN Natosha Minick, APRN - CNP        Or    acetaminophen (TYLENOL) suppository 650 mg  650 mg Rectal Q6H PRN Natosha Minick, APRN - CNP        perflutren lipid microspheres (DEFINITY) injection 1.65 mg  1.5 mL IntraVENous ONCE PRN Natosha Minick, APRN - CNP           ROS : A 10-14 system review of constitutional, cardiovascular, respiratory, eyes, musculoskeletal, endocrine, GI, ENT, skin, hematological, genitourinary, psychiatric and neurologic systems was obtained and updated today and is unremarkable except as mentioned in my HPI      Exam:     Constitutional:   Vitals:    12/29/20 2340 12/30/20 0415 12/30/20 0800 12/30/20 0958   BP: 112/71 116/70 109/69    Pulse: 72 59 58 66   Resp: 16 15 16 18    Temp: 98.3 ??F (36.8 ??C) 97.8 ??F (36.6 ??C) 97.5 ??F (36.4 ??C)    TempSrc: Oral Oral Oral    SpO2: 97% 93% 98% 99%   Weight:       Height:           General appearance and observation: Normal development and appear in no acute distress.   Eye:  Fundus: No blurring of optic disc.   Neck: supple  Cardiovascular: No lower leg edema with good pulsation.   Mental Status:   Oriented to person, place, problem, and time.    Memory: Good immediate recall.  Intact remote memory  Normal attention span and concentration.  Language: intact naming, repeating and fluency   Good fund of Knowledge. Aware of current events and vocabulary   Cranial Nerves:   II: Visual fields: Full.  Pupils: equal, round, reactive to light  III,IV,VI: Extra Ocular Movements are intact. No nystagmus  V: Facial sensation is intact  VII: Facial strength and movements: intact and symmetric  VIII: Hearing:  Intact  IX: Palate elevation is symmetric  XI: Shoulder shrug is intact  XII: Tongue movements are normal  Musculoskeletal: 5/5 in all 4 extremities.   Tone: Normal tone.   Reflexes: Symmetric 2+ in the arms and 2+ in the  legs   Planters: flexor bilaterally.  Coordination: no pronator drift, no dysmetria with FNF in upper extremities. Normal REM.   Sensation: normal to all modalities in both arms and legs.  Gait/Posture: steady gait and normal posturing and station.     Data:  LABS:   Lab Results   Component Value Date/Time    NA 140 12/30/2020 04:25 AM    K 4.1 12/30/2020 04:25 AM    K 3.9 12/29/2020 04:42 AM    CL 107 12/30/2020 04:25 AM    CO2 27 12/30/2020 04:25 AM    BUN 8 12/30/2020 04:25 AM    CREATININE 0.8 12/30/2020 04:25 AM    GFRAA >60 12/30/2020 04:25 AM    LABGLOM >60 12/30/2020 04:25 AM    GLUCOSE 98 12/30/2020 04:25 AM    PHOS 3.3 12/30/2020 04:25 AM    MG 2.20 12/30/2020 04:25 AM    CALCIUM 8.9 12/30/2020 04:25 AM     Lab Results   Component Value Date/Time    WBC 5.6 12/30/2020 04:25 AM    RBC 4.80 12/30/2020 04:25 AM    HGB 13.7 12/30/2020 04:25 AM    HCT 42.0 12/30/2020 04:25 AM    MCV 87.5 12/30/2020 04:25 AM    RDW 14.6 12/30/2020 04:25 AM    PLT 243 12/30/2020 04:25 AM     Lab Results   Component Value Date    INR 1.03 10/28/2020    PROTIME 11.6 10/28/2020       Neuroimaging was independently reviewed by myself and discussed results with the patient   Reviewed notes from different physicians  Reviewed lab and blood testing    Impression:  New onset syncope, cause so far is unclear to me.  Less likely seizure.  Rule out cardiac arrhythmia.  Hypertension  Hyperlipidemia      Recommendation:  Agree with EEG  Will need prolonged cardiac monitoring  Aspirin  Statin  Continue current blood pressure medications  Telemetry  DVT and GI prophylaxis  Echo results from yesterday reviewed  Driving precautions discussed with the patient today  Monitor blood pressure at home  DC planning   Will follow PRN  Please call for ?s      Thank you for referring such patient. If you have any questions regarding my consult note, please don't hesitate to call me.     Otelia Sergeant, MD  [phone]    This dictation was generated by voice  recognition computer software. Although all attempts are made to edit the dictation for accuracy, there may be errors in the  transcription that are not intended

## 2021-01-18 ENCOUNTER — Ambulatory Visit: Admit: 2021-01-18 | Discharge: 2021-01-18 | Payer: MEDICAID | Attending: Internal Medicine | Primary: Adult Health

## 2021-01-18 DIAGNOSIS — R053 Chronic cough: Secondary | ICD-10-CM

## 2021-01-18 MED ORDER — GUAIFENESIN ER 600 MG PO TB12
600 MG | ORAL_TABLET | Freq: Two times a day (BID) | ORAL | 0 refills | Status: DC
Start: 2021-01-18 — End: 2021-02-08

## 2021-01-18 MED ORDER — LOSARTAN POTASSIUM 25 MG PO TABS
25 MG | ORAL_TABLET | Freq: Every day | ORAL | 0 refills | Status: DC
Start: 2021-01-18 — End: 2021-02-08

## 2021-01-18 MED ORDER — FLUTICASONE-SALMETEROL 250-50 MCG/ACT IN AEPB
250-50 | Freq: Two times a day (BID) | RESPIRATORY_TRACT | 3 refills | Status: DC
Start: 2021-01-18 — End: 2023-02-22

## 2021-01-18 NOTE — Progress Notes (Signed)
Michael Knight    Date of Birth: 04/23/1956     Date of Service:  01/18/2021     Chief Complaint   Patient presents with    3 Month Follow-Up    Cough         HPI Nepali interpreter was used for the encounter.  Was recently hospitalized between 7/19 and 7/21 for what appeared to be a cough syncope.  Patient continues to have cough, particularly worse at nighttime.  He feels that he has mucus stuck in his throat and unable to bring it up.  He has difficulty in swallowing water, due to cough and irritation of throat.  Denies any significant shortness of breath or wheezing.    No Known Allergies  Outpatient Medications Marked as Taking for the 01/18/21 encounter (Office Visit) with Dulce Sellar, MD   Medication Sig Dispense Refill    losartan (COZAAR) 25 MG tablet Take 1 tablet by mouth in the morning. 30 tablet 0    guaiFENesin (MUCINEX) 600 MG extended release tablet Take 1 tablet by mouth in the morning and 1 tablet before bedtime. Do all this for 15 days. 30 tablet 0    fluticasone-salmeterol (ADVAIR DISKUS) 250-50 MCG/ACT AEPB diskus inhaler Inhale 1 puff into the lungs in the morning and 1 puff in the evening. 60 each 3    acetaminophen (TYLENOL) 500 MG tablet Take 500 mg by mouth every 6 hours as needed for Pain      amLODIPine (NORVASC) 10 MG tablet Take 10 mg by mouth daily      montelukast (SINGULAIR) 10 MG tablet Take 10 mg by mouth nightly      cetirizine (ZYRTEC) 10 MG tablet Take 10 mg by mouth daily      SUMAtriptan (IMITREX) 50 MG tablet Take 50 mg by mouth once as needed for Migraine      traZODone (DESYREL) 50 MG tablet Take 50 mg by mouth nightly 1 and 1/2 tabs      [DISCONTINUED] hydroCHLOROthiazide (MICROZIDE) 12.5 MG capsule Take 12.5 mg by mouth daily      albuterol sulfate HFA (VENTOLIN HFA) 108 (90 Base) MCG/ACT inhaler Inhale 2 puffs into the lungs 4 times daily as needed for Wheezing 54 g 1    pantoprazole (PROTONIX) 40 MG tablet Take 1 tablet by mouth every morning (before breakfast) 90  tablet 1    docusate sodium (COLACE) 100 MG capsule Take 1 capsule by mouth 2 times daily 30 capsule 0    levothyroxine (SYNTHROID) 50 MCG tablet Take 50 mcg by mouth Daily      pravastatin (PRAVACHOL) 40 MG tablet Take 40 mg by mouth nightly          Immunization History   Administered Date(s) Administered    COVID-19, PFIZER PURPLE top, DILUTE for use, (age 87 y+), 81mcg/0.3mL 08/25/2019, 09/15/2019, 06/09/2020    Influenza, Pilar Jarvis, Recombinant, IM PF (Flublok 18 yrs and older) 05/09/2019    Influenza, Triv, inactivated, subunit, adjuvanted, IM (Fluad 65 yrs and older) 04/13/2016       Past Medical History:   Diagnosis Date    Asthma     Back pain     Dyslipidemia     GERD (gastroesophageal reflux disease)     Hyperlipidemia     Hypertension     Hypothyroidism      Past Surgical History:   Procedure Laterality Date    BRONCHOSCOPY N/A 10/28/2020    BRONCHOSCOPY ALVEOLAR LAVAGE performed by Dulce Sellar,  MD at [facility] ENDOSCOPY    ELBOW SURGERY Right     HAND SURGERY      UPPER GASTROINTESTINAL ENDOSCOPY N/A 02/08/2018    EGD BIOPSY performed by Kandice Robinsons, MD at [facility] ENDOSCOPY     History reviewed. No pertinent family history.    Review of Systems:  Review of Systems   Constitutional:  Negative for activity change, appetite change, fatigue and fever.   HENT:  Negative for congestion, ear discharge, ear pain, postnasal drip, rhinorrhea, sinus pressure, sneezing, sore throat, tinnitus and voice change.    Respiratory:  Positive for cough. Negative for apnea, choking, chest tightness, shortness of breath, wheezing and stridor.    Cardiovascular:  Negative for chest pain, palpitations and leg swelling.   Gastrointestinal:  Negative for abdominal distention, abdominal pain, anal bleeding, blood in stool, constipation and diarrhea.   Musculoskeletal:  Negative for arthralgias, back pain and gait problem.   Skin:  Negative for pallor and rash.   Allergic/Immunologic: Negative for environmental  allergies.   Neurological:  Negative for dizziness, tremors, seizures, syncope, speech difficulty, weakness, light-headedness, numbness and headaches.   Hematological:  Negative for adenopathy. Does not bruise/bleed easily.   Psychiatric/Behavioral:  Negative for sleep disturbance.      Vitals:    01/18/21 1142   BP: 122/70   Pulse: 74   SpO2: 96%   Weight: 146 lb (66.2 kg)   Height: 5\' 2"  (1.575 m)     No flowsheet data found.   Body mass index is 26.7 kg/m??.     Wt Readings from Last 3 Encounters:   01/18/21 146 lb (66.2 kg)   12/28/20 150 lb (68 kg)   11/02/20 147 lb (66.7 kg)     BP Readings from Last 3 Encounters:   01/18/21 122/70   12/30/20 138/79   11/02/20 128/80         Physical Exam  Constitutional:       General: He is not in acute distress.     Appearance: He is well-developed. He is not ill-appearing or diaphoretic.   HENT:      Mouth/Throat:      Pharynx: No oropharyngeal exudate.   Cardiovascular:      Rate and Rhythm: Normal rate and regular rhythm.      Heart sounds: Normal heart sounds. No murmur heard.    No friction rub.   Pulmonary:      Effort: No respiratory distress.      Breath sounds: Normal breath sounds. No wheezing, rhonchi or rales.   Chest:      Chest wall: No tenderness.   Abdominal:      General: There is no distension.      Palpations: There is no mass.      Tenderness: There is no abdominal tenderness. There is no guarding or rebound.   Musculoskeletal:         General: No swelling or tenderness.   Skin:     Coloration: Skin is not pale.      Findings: No erythema or rash.   Neurological:      Mental Status: He is alert and oriented to person, place, and time.      Cranial Nerves: No cranial nerve deficit.      Motor: No abnormal muscle tone.      Coordination: Coordination normal.      Deep Tendon Reflexes: Reflexes normal.           Health Maintenance  Topic Date Due    Depression Screen  Never done    HIV screen  Never done    Hepatitis C screen  Never done    DTaP/Tdap/Td  vaccine (1 - Tdap) Never done    Prostate Specific Antigen (PSA) Screening or Monitoring  Never done    Colorectal Cancer Screen  Never done    Shingles vaccine (1 of 2) Never done    Lipids  02/09/2019    COVID-19 Vaccine (4 - Booster for Pfizer series) 10/08/2020    Flu vaccine (1) 02/10/2021    Hepatitis A vaccine  Aged Out    Hepatitis B vaccine  Aged Out    Hib vaccine  Aged Out    Meningococcal (ACWY) vaccine  Aged Out    Pneumococcal 0-64 years Vaccine  Aged Out          Assessment/Plan:  Cough could be related to ACE inhibitor.  Unfortunately patient continues to take lisinopril-I have taken the liberty of switching lisinopril over to losartan 25 mg daily.  Patient already taking Protonix 40 mg daily, has not noticed any improvement in symptoms.    I will increase the dose of Advair from 100 to 250, 1 puff twice daily. ?  Cough variant asthma.  I have requested patient to rinse mouth to prevent oral thrush.  Prescribed Mucinex x15 days for promoting expectoration.    Patient has previously been extensively investigated for cough-CT chest showed atelectatic changes/fibrotic changes, bronchoscopy from 5/19 showed negative cultures including AFB.  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    Return in about 6 weeks (around 03/01/2021).

## 2021-02-08 MED ORDER — MUCUS RELIEF 600 MG PO TB12
600 MG | ORAL_TABLET | ORAL | 0 refills | Status: AC
Start: 2021-02-08 — End: ?

## 2021-02-08 MED ORDER — LOSARTAN POTASSIUM 25 MG PO TABS
25 MG | ORAL_TABLET | ORAL | 0 refills | Status: AC
Start: 2021-02-08 — End: ?

## 2021-02-08 NOTE — Telephone Encounter (Signed)
Last appointment:  01/18/2021    Next appointment:  03/01/2021

## 2021-02-08 NOTE — Addendum Note (Signed)
Addended by: Haynes Hoehn on: 02/08/2021 01:28 PM     Modules accepted: Orders

## 2021-03-01 ENCOUNTER — Encounter: Payer: MEDICAID | Attending: Internal Medicine | Primary: Adult Health

## 2021-03-09 ENCOUNTER — Ambulatory Visit: Payer: MEDICAID

## 2021-04-01 ENCOUNTER — Encounter: Payer: Self-pay | Admitting: Gastroenterology

## 2021-04-26 ENCOUNTER — Encounter: Payer: Self-pay | Admitting: Gastroenterology

## 2021-04-27 ENCOUNTER — Encounter: Payer: Self-pay | Admitting: Gastroenterology

## 2021-04-27 ENCOUNTER — Other Ambulatory Visit (INDEPENDENT_AMBULATORY_CARE_PROVIDER_SITE_OTHER): Payer: Medicaid Other

## 2021-04-27 ENCOUNTER — Ambulatory Visit (INDEPENDENT_AMBULATORY_CARE_PROVIDER_SITE_OTHER): Payer: Medicaid Other | Admitting: Gastroenterology

## 2021-04-27 VITALS — BP 120/60 | HR 61 | Ht 64.0 in | Wt 158.0 lb

## 2021-04-27 DIAGNOSIS — Z1211 Encounter for screening for malignant neoplasm of colon: Secondary | ICD-10-CM | POA: Diagnosis not present

## 2021-04-27 DIAGNOSIS — Z1212 Encounter for screening for malignant neoplasm of rectum: Secondary | ICD-10-CM

## 2021-04-27 DIAGNOSIS — R1084 Generalized abdominal pain: Secondary | ICD-10-CM

## 2021-04-27 LAB — BASIC METABOLIC PANEL
BUN: 13 mg/dL (ref 6–23)
CO2: 28 mEq/L (ref 19–32)
Calcium: 9.7 mg/dL (ref 8.4–10.5)
Chloride: 99 mEq/L (ref 96–112)
Creatinine, Ser: 0.96 mg/dL (ref 0.40–1.50)
GFR: 83.32 mL/min (ref >60.00)
Glucose, Bld: 360 mg/dL — ABNORMAL HIGH (ref 70–99)
Potassium: 4.3 mEq/L (ref 3.5–5.1)
Sodium: 135 mEq/L (ref 135–145)

## 2021-04-27 MED ORDER — PLENVU 140 G PO SOLR: ORAL | 0 refills | Status: DC

## 2021-04-27 NOTE — Patient Instructions (Addendum)
If you are age 64 or older, your body mass index should be between 23-30. Your Body mass index is 27.12 kg/m. If this is out of the aforementioned range listed, please consider follow up with your Primary Care Provider.  If you are age 57 or younger, your body mass index should be between 19-25. Your Body mass index is 27.12 kg/m. If this is out of the aformentioned range listed, please consider follow up with your Primary Care Provider.  Start Metamucil daily.  Try over the counter IB guard once daily.   You have been scheduled for a colonoscopy. Please follow written instructions given to you at your visit today.  Please pick up your prep supplies at the pharmacy within the next 1-3 days. If you use inhalers (even only as needed), please bring them with you on the day of your procedure.  Your provider has requested that you go to the basement level for lab work before leaving today. Press "B" on the elevator. The lab is located at the first door on the left as you exit the elevator.  The [facility] providers would like to encourage you to use [facility] to communicate with providers for non-urgent requests or questions.  Due to long hold times on the telephone, sending your provider a message by [facility] may be a faster and more efficient way to get a response.  Please allow 48 business hours for a response.  Please remember that this is for non-urgent requests.   Due to recent changes in healthcare laws, you may see the results of your imaging and laboratory studies on MyChart before your provider has had a chance to review them.  We understand that in some cases there may be results that are confusing or concerning to you. Not all laboratory results come back in the same time frame and the provider may be waiting for multiple results in order to interpret others.  Please give [place] 48 hours in order for your provider to thoroughly review all the results before contacting the office for clarification  of your results.   It was a pleasure to see you today!  Thank you for trusting me with your gastrointestinal care!    Scott E.Tomasa Rand, MD

## 2021-04-27 NOTE — Progress Notes (Signed)
HPI : Shawn Patton is a pleasant 65 year old [place] male with poorly controlled diabetes who is referred by Dr. Boykin Nearing for further evaluation of chronic abdominal pain.  Patient states that he has been having abdominal pain for about 2 years.  However, it seems that his pain may have been more longstanding than that, as he has had an ultrasound in 2017 to evaluate upper abdominal pain, and then a HIDA scan in 2019 to evaluate the same.  The patient states that his pain is localized mostly to the upper abdomen, and sometimes goes into his back.  The pain is more noticeable at night, and sometimes wakes him from sleep.  It is described as a dull, achy pain, not severe.  Usually it is about a 3-4/10 in severity.  He denies any nausea or vomiting associated with the pain.  He reports regular bowel habits, although his stool consistency varies.  His symptoms do not seem to vary much with his diet.  He denies problems with heartburn or acid regurgitation.  No dysphagia.  No blood in his stool.  He reports his weight has been stable.  No family history of GI malignancy.  He had a pan endoscopic evaluation in 2012 while admitted for rectal bleeding.  This evaluation was notable only for some mild strict peptic ulcer disease, otherwise normal.    Past Medical History:  Diagnosis Date   Diabetes mellitus without complication ([place]) Dx 5176   GERD (gastroesophageal reflux disease)    Hearing loss    Hypertension    Neuropathy in diabetes [facility])      Past Surgical History:  Procedure Laterality Date   LAPAROSCOPIC APPENDECTOMY N/A 10/21/2014   Procedure: APPENDECTOMY LAPAROSCOPIC;  Surgeon: Stark Klein, MD;  Location: [place];  Service: General;  Laterality: N/A;   STOMACH SURGERY  2012   History reviewed. No pertinent family history. Social History   Tobacco Use   Smoking status: Never   Smokeless tobacco: Never  Substance Use Topics   Alcohol use: No   Drug use: Never   Current Outpatient  Medications  Medication Sig Dispense Refill   Blood Glucose Monitoring Suppl (TRUE METRIX METER) w/Device KIT 1 each by Does not apply route as needed. 1 kit 0   DULoxetine (CYMBALTA) 30 MG capsule Take 30 mg by mouth daily.     glucose blood (TRUE METRIX BLOOD GLUCOSE TEST) test strip 1 each by Other route 3 (three) times daily. 100 each 11   insulin glargine (LANTUS) 100 UNIT/ML Solostar Pen Inject into the skin daily.     TRUEPLUS LANCETS 28G MISC 1 each by Does not apply route 3 (three) times daily. 100 each 11   Current Facility-Administered Medications  Medication Dose Route Frequency Provider Last Rate Last Admin   0.9 %  sodium chloride infusion   Intravenous Once Funches, Josalyn, MD       No Known Allergies   Review of Systems: All systems reviewed and negative except where noted in HPI.    No results found.  Physical Exam: BP 120/60   Pulse 61   Ht 5' 4"  (1.626 m)   Wt 158 lb (71.7 kg)   BMI 27.12 kg/m  Constitutional: Pleasant,well-developed, [place] male in no acute distress.  Accompanied by son and medical interpreter.   HEENT: Normocephalic and atraumatic. Conjunctivae are normal. No scleral icterus. Cardiovascular: Normal rate, regular rhythm.  Pulmonary/chest: Effort normal and breath sounds normal. No wheezing, rales or rhonchi. Abdominal: Soft, nondistended, nontender. Bowel  sounds active throughout. There are no masses palpable. No hepatomegaly. Extremities: no edema Neurological: Alert and oriented to person place and time. Skin: Skin is warm and dry. No rashes noted. Psychiatric: Normal mood and affect. Behavior is normal.  CBC    Component Value Date/Time   WBC 4.6 08/02/2020 1604   RBC 4.82 08/02/2020 1604   HGB 15.2 08/02/2020 1604   HCT 43.5 08/02/2020 1604   PLT 141 08/02/2020 1604   MCV 90.2 08/02/2020 1604   MCH 31.5 08/02/2020 1604   MCHC 34.9 08/02/2020 1604   RDW 11.7 08/02/2020 1604   LYMPHSABS 1.6 10/19/2014 1412   MONOABS 0.8  10/19/2014 1412   EOSABS 0.0 10/19/2014 1412   BASOSABS 0.0 10/19/2014 1412    CMP     Component Value Date/Time   NA 135 08/02/2020 1604   NA 141 11/21/2016 1034   K 4.1 08/02/2020 1604   CL 101 08/02/2020 1604   CO2 25 08/02/2020 1604   GLUCOSE 334 (H) 08/02/2020 1604   BUN 11 08/02/2020 1604   BUN 11 11/21/2016 1034   CREATININE 0.95 08/02/2020 1604   CALCIUM 9.3 08/02/2020 1604   PROT 7.0 08/02/2020 1604   ALBUMIN 4.6 03/14/2016 1516   AST 26 08/02/2020 1604   ALT 43 08/02/2020 1604   ALKPHOS 69 03/14/2016 1516   BILITOT 0.9 08/02/2020 1604   GFRNONAA 84 08/02/2020 1604   GFRAA 98 08/02/2020 1604   Colonoscopy 2012 (rectal bleeding, weight loss): Small internal hemorrhoids, otherwise normal colonoscopy, fresh blood noted in right colon EGD 2012: Small clean-based antral ulcer and moderate gastritis, otherwise normal VCE, September 2012: Linear erosion in gastric antrum, questionable AVM in duodenal bulb, otherwise normal (small bowel passage time 3 hours 22 minutes)   Normal RUQUS and HIDA 2017 2019  ASSESSMENT AND PLAN: 65 year old male with poorly controlled diabetes and vague chronic abdominal discomfort, with history of normal right upper quadrant ultrasound and HIDA scan and remote EGD with gastric ulcers/erosions.  His description of his symptoms do not seem entirely consistent with IBS or dyspepsia.  Given his longstanding poorly controlled diabetes, I wonder whether hyperglycemia or gastroparesis may be contributing, although his symptoms do not seem very classic for this either.  We will get a H. pylori stool antigen, and repeat upper endoscopy to assess for ongoing peptic ulcer disease.  We will also plan for colonoscopy for cancer screening.  In the meantime, recommended he try taking an IBgard in the evenings around bedtime.  Abdominal discomfort - EGD - H. Pylori stool antigen - IBGard nightly - Repeat BMP   Colon cancer screening - Screening  colonoscopy  The details, risks (including bleeding, perforation, infection, missed lesions, medication reactions and possible hospitalization or surgery if complications occur), benefits, and alternatives to colonoscopy with possible biopsy and possible polypectomy were discussed with the patient and he consents to proceed.   Facundo Allemand E. Candis Schatz, MD [facility]   CC:  Boykin Nearing, MD

## 2021-04-29 ENCOUNTER — Encounter: Payer: Self-pay | Admitting: Gastroenterology

## 2021-04-30 NOTE — Progress Notes (Signed)
Shawn Patton,  Your blood glucose was again very elevated at 360.  Please make efforts to better control your diabetes.  Having high blood sugar could be an explanation for some of your GI symptoms.  If your blood sugar is >350 the day of your procedure, your procedure will have be cancelled, based on our endoscopy center policy.

## 2021-05-04 ENCOUNTER — Other Ambulatory Visit: Payer: Medicaid Other

## 2021-05-04 DIAGNOSIS — Z1211 Encounter for screening for malignant neoplasm of colon: Secondary | ICD-10-CM

## 2021-05-04 DIAGNOSIS — R1084 Generalized abdominal pain: Secondary | ICD-10-CM

## 2021-05-07 LAB — H. PYLORI ANTIGEN, STOOL: H pylori Ag, Stl: NEGATIVE

## 2021-05-09 NOTE — Progress Notes (Signed)
Bonita Quin,  Can you please inform Mr. Vitanza that his test for H. Pylori was negative?  Thank you

## 2021-05-10 ENCOUNTER — Telehealth: Payer: Self-pay

## 2021-05-10 NOTE — Telephone Encounter (Signed)
Called patient via interpreter: Dilli [phone] and left message to please call back. Calling to give stool test results.

## 2021-05-10 NOTE — Telephone Encounter (Signed)
-----   Message from Jenel Lucks, MD sent at 05/09/2021  5:01 PM EST ----- Bonita Quin,  Can you please inform Mr. Dripps that his test for H. Pylori was negative?  Thank you

## 2021-07-01 ENCOUNTER — Ambulatory Visit (AMBULATORY_SURGERY_CENTER): Payer: Medicaid Other | Admitting: Gastroenterology

## 2021-07-01 ENCOUNTER — Encounter: Payer: Self-pay | Admitting: Gastroenterology

## 2021-07-01 VITALS — BP 111/66 | HR 54 | Temp 97.6°F | Resp 16 | Ht 64.0 in | Wt 158.0 lb

## 2021-07-01 DIAGNOSIS — R1013 Epigastric pain: Secondary | ICD-10-CM | POA: Diagnosis not present

## 2021-07-01 DIAGNOSIS — Z1211 Encounter for screening for malignant neoplasm of colon: Secondary | ICD-10-CM

## 2021-07-01 DIAGNOSIS — R1084 Generalized abdominal pain: Secondary | ICD-10-CM

## 2021-07-01 DIAGNOSIS — K2970 Gastritis, unspecified, without bleeding: Secondary | ICD-10-CM

## 2021-07-01 DIAGNOSIS — K2950 Unspecified chronic gastritis without bleeding: Secondary | ICD-10-CM | POA: Diagnosis not present

## 2021-07-01 MED ORDER — SODIUM CHLORIDE 0.9 % IV SOLN: 500.0000 mL | Freq: Once | INTRAVENOUS | Status: DC

## 2021-07-01 MED ORDER — OMEPRAZOLE 20 MG PO CPDR: 20.0000 mg | DELAYED_RELEASE_CAPSULE | Freq: Every day | ORAL | 1 refills | Status: DC

## 2021-07-01 NOTE — Op Note (Signed)
Endoscopy Center Patient Name: Shawn Patton Procedure Date: 07/01/2021 10:20 AM MRN: [id number] Endoscopist: Shawn Patton , MD Age: 66 Referring MD:  Date of Birth: 1956/05/22 Gender: Male Account #: [id number] Procedure:                Upper GI endoscopy Indications:              Epigastric abdominal pain Medicines:                Monitored Anesthesia Care Procedure:                Pre-Anesthesia Assessment:                           - Prior to the procedure, a History and Physical                            was performed, and patient medications and                            allergies were reviewed. The patient's tolerance of                            previous anesthesia was also reviewed. The risks                            and benefits of the procedure and the sedation                            options and risks were discussed with the patient.                            All questions were answered, and informed consent                            was obtained. Prior Anticoagulants: The patient has                            taken no previous anticoagulant or antiplatelet                            agents. ASA Grade Assessment: III - A patient with                            severe systemic disease. After reviewing the risks                            and benefits, the patient was deemed in                            satisfactory condition to undergo the procedure.                           After obtaining informed consent, the endoscope was  passed under direct vision. Throughout the                            procedure, the patient's blood pressure, pulse, and                            oxygen saturations were monitored continuously. The                            GIF [id number] #[id number] was introduced through the                            mouth, and advanced to the second part of duodenum.                            The upper GI endoscopy was  accomplished without                            difficulty. The patient tolerated the procedure                            well. Scope In: Scope Out: Findings:                 The examined portions of the nasopharynx,                            oropharynx and larynx were normal.                           The examined esophagus was normal.                           Diffuse mildly erythematous mucosa without bleeding                            was found in the gastric body and in the gastric                            antrum. Biopsies were taken with a cold forceps for                            Helicobacter pylori testing. Estimated blood loss                            was minimal.                           Diffuse mildly erythematous mucosa without active                            bleeding and with no stigmata of bleeding was found                            in the duodenal bulb and in the second portion  of                            the duodenum. Biopsies were taken with a cold                            forceps for histology. Estimated blood loss was                            minimal.                           The exam of the duodenum was otherwise normal. Complications:            No immediate complications. Estimated Blood Loss:     Estimated blood loss was minimal. Impression:               - The examined portions of the nasopharynx,                            oropharynx and larynx were normal.                           - Normal esophagus.                           - Erythematous mucosa in the gastric body and                            antrum. Biopsied.                           - Erythematous duodenopathy. Biopsied.                           - Poorly controlled diabetes/hyperglycemia can also                            cause symptoms of bloating, nausea and abdominal                            discomfort Recommendation:           - Patient has a contact number available for                             emergencies. The signs and symptoms of potential                            delayed complications were discussed with the                            patient. Return to normal activities tomorrow.                            Written discharge instructions were provided to the  patient.                           - Resume previous diet.                           - Use Prilosec (omeprazole) 20 mg PO daily for 4                            weeks.                           - Await pathology results.                           - Continue to work to improved diabetic control                           - Avoid NSAIDs Shawn Skousen E. Tomasa Randunningham, MD 07/01/2021 11:27:04 AM This report has been signed electronically.

## 2021-07-01 NOTE — Progress Notes (Signed)
Called to room to assist during endoscopic procedure.  Patient ID and intended procedure confirmed with present staff. Received instructions for my participation in the procedure from the performing physician.  

## 2021-07-01 NOTE — Progress Notes (Signed)
VSS, transported to PACU °

## 2021-07-01 NOTE — Patient Instructions (Addendum)
Use Prilosec (omeprazole) 20mg  by mouth daily for 4 weeks.   Avoid aspirin, ibuprofen, naproxen, or other non-steriodal anti-inflammatory drugs (NSAIDs). You may use Tylenol if needed for mild pain or fever.   Continue to work to improve diabetic control.    YOU HAD AN ENDOSCOPIC PROCEDURE TODAY AT [facility]:   Refer to the procedure report that was given to you for any specific questions about what was found during the examination.  If the procedure report does not answer your questions, please call your gastroenterologist to clarify.  If you requested that your care partner not be given the details of your procedure findings, then the procedure report has been included in a sealed envelope for you to review at your convenience later.  YOU SHOULD EXPECT: Some feelings of bloating in the abdomen. Passage of more gas than usual.  Walking can help get rid of the air that was put into your GI tract during the procedure and reduce the bloating. If you had a lower endoscopy (such as a colonoscopy or flexible sigmoidoscopy) you may notice spotting of blood in your stool or on the toilet paper. If you underwent a bowel prep for your procedure, you may not have a normal bowel movement for a few days.  Please Note:  You might notice some irritation and congestion in your nose or some drainage.  This is from the oxygen used during your procedure.  There is no need for concern and it should clear up in a day or so.  SYMPTOMS TO REPORT IMMEDIATELY:   Following upper endoscopy (EGD)  Vomiting of blood or coffee ground material  New chest pain or pain under the shoulder blades  Painful or persistently difficult swallowing  New shortness of breath  Fever of 100F or higher  Black, tarry-looking stools  For urgent or emergent issues, a gastroenterologist can be reached at any hour by calling [phone]. Do not use MyChart messaging for urgent concerns.    DIET:  We do recommend a  small meal at first, but then you may proceed to your regular diet.  Drink plenty of fluids but you should avoid alcoholic beverages for 24 hours.  ACTIVITY:  You should plan to take it easy for the rest of today and you should NOT DRIVE or use heavy machinery until tomorrow (because of the sedation medicines used during the test).    FOLLOW UP: Our staff will call the number listed on your records 48-72 hours following your procedure to check on you and address any questions or concerns that you may have regarding the information given to you following your procedure. If we do not reach you, we will leave a message.  We will attempt to reach you two times.  During this call, we will ask if you have developed any symptoms of COVID 19. If you develop any symptoms (ie: fever, flu-like symptoms, shortness of breath, cough etc.) before then, please call [phone].  If you test positive for Covid 19 in the 2 weeks post procedure, please call and report this information to [place].    If any biopsies were taken you will be contacted by phone or by letter within the next 1-3 weeks.  Please call us at [phone] if you have not heard about the biopsies in 3 weeks.    SIGNATURES/CONFIDENTIALITY: You and/or your care partner have signed paperwork which will be entered into your electronic medical record.  These signatures attest to the fact that  that the information above on your After Visit Summary has been reviewed and is understood.  Full responsibility of the confidentiality of this discharge information lies with you and/or your care-partner.

## 2021-07-01 NOTE — Progress Notes (Signed)
[facility] History and Physical   Primary Care Physician:  Boykin Nearing, MD   Reason for Procedure:   Abdominal pain  Plan:    EGD     HPI: Shawn Patton is a 66 y.o. male undergoing EGD to evaluate chronic epigastric pain.  He has a history of peptic ulcer disease on EGD in 2012.  He has poorly controlled diabetes.  He was also supposed to have undergone screening colonoscopy today, but he ate yesterday and is still having solid stool, so the colonoscopy was canceled.   Past Medical History:  Diagnosis Date   Diabetes mellitus without complication ([place]) Dx 1657   GERD (gastroesophageal reflux disease)    Hearing loss    Hypertension    Neuropathy in diabetes [facility])     Past Surgical History:  Procedure Laterality Date   LAPAROSCOPIC APPENDECTOMY N/A 10/21/2014   Procedure: APPENDECTOMY LAPAROSCOPIC;  Surgeon: Stark Klein, MD;  Location: [place];  Service: General;  Laterality: N/A;   STOMACH SURGERY  2012    Prior to Admission medications   Medication Sig Start Date End Date Taking? Authorizing Provider  Blood Glucose Monitoring Suppl (TRUE METRIX METER) w/Device KIT 1 each by Does not apply route as needed. 11/15/15  Yes Funches, Josalyn, MD  glucose blood (TRUE METRIX BLOOD GLUCOSE TEST) test strip 1 each by Other route 3 (three) times daily. 11/15/15  Yes Funches, Josalyn, MD  insulin glargine (LANTUS) 100 UNIT/ML Solostar Pen Inject into the skin daily.   Yes [provider]  TRUEPLUS LANCETS 28G MISC 1 each by Does not apply route 3 (three) times daily. 11/15/15  Yes Funches, Josalyn, MD  DULoxetine (CYMBALTA) 30 MG capsule Take 30 mg by mouth daily.    [provider]    Current Outpatient Medications  Medication Sig Dispense Refill   Blood Glucose Monitoring Suppl (TRUE METRIX METER) w/Device KIT 1 each by Does not apply route as needed. 1 kit 0   glucose blood (TRUE METRIX BLOOD GLUCOSE TEST) test strip 1 each by Other route 3 (three) times  daily. 100 each 11   insulin glargine (LANTUS) 100 UNIT/ML Solostar Pen Inject into the skin daily.     TRUEPLUS LANCETS 28G MISC 1 each by Does not apply route 3 (three) times daily. 100 each 11   DULoxetine (CYMBALTA) 30 MG capsule Take 30 mg by mouth daily.     Current Facility-Administered Medications  Medication Dose Route Frequency Provider Last Rate Last Admin   0.9 %  sodium chloride infusion  500 mL Intravenous Once Daryel November, MD        Allergies as of 07/01/2021   (No Known Allergies)    Family History  Problem Relation Age of Onset   Colon cancer Neg Hx    Colon polyps Neg Hx     Social History   Socioeconomic History   Marital status: Married    Spouse name: Not on file   Number of children: Not on file   Years of education: Not on file   Highest education level: Not on file  Occupational History   Not on file  Tobacco Use   Smoking status: Never   Smokeless tobacco: Never  Substance and Sexual Activity   Alcohol use: No   Drug use: Never   Sexual activity: Not on file  Other Topics Concern   Not on file  Social History Narrative   Not on file   Social Determinants of Health   Financial  Resource Strain: Not on file  Food Insecurity: Not on file  Transportation Needs: Not on file  Physical Activity: Not on file  Stress: Not on file  Social Connections: Not on file  Intimate Partner Violence: Not on file    Review of Systems:  All other review of systems negative except as mentioned in the HPI.  Physical Exam: Vital signs BP 137/72    Pulse (!) 52    Temp 97.6 F (36.4 C)    Resp 11    Ht 5' 4"  (1.626 m)    Wt 158 lb (71.7 kg)    SpO2 100%    BMI 27.12 kg/m   General:   Alert,  Well-developed, well-nourished, pleasant and cooperative in NAD.  Medical translator present Airway:  Mallampati 1 Lungs:  Clear throughout to auscultation.   Heart:  Regular rate and rhythm; no murmurs, clicks, rubs,  or gallops. Abdomen:  Soft, nontender  and nondistended. Normal bowel sounds.   Neuro/Psych:  Normal mood and affect. A and O x 3   Shawn Almanza E. Candis Schatz, MD [facility]

## 2021-07-05 ENCOUNTER — Telehealth: Payer: Self-pay

## 2021-07-05 NOTE — Telephone Encounter (Signed)
°  Follow up Call-  Call back number 07/01/2021  Post procedure Call Back phone  # [phone] SON'S NUMBER- OK TO [place] to leave phone message Yes  Some recent data might be hidden     Patient questions:  Do you have a fever, pain , or abdominal swelling? No. Pain Score  0 *  Have you tolerated food without any problems? Yes.    Have you been able to return to your normal activities? Yes.    Do you have any questions about your discharge instructions: Diet   No. Medications  No. Follow up visit  No.  Do you have questions or concerns about your Care? No.  Actions: * If pain score is 4 or above: No action needed, pain <4.  Have you developed a fever since your procedure? no  2.   Have you had an respiratory symptoms (SOB or cough) since your procedure? no  3.   Have you tested positive for COVID 19 since your procedure no  4.   Have you had any family members/close contacts diagnosed with the COVID 19 since your procedure?  no   If yes to any of these questions please route to Joylene John, RN and Joella Prince, RN

## 2021-07-13 ENCOUNTER — Encounter: Payer: Self-pay | Admitting: Gastroenterology

## 2021-08-05 ENCOUNTER — Other Ambulatory Visit: Payer: Self-pay | Admitting: Gastroenterology

## 2021-08-05 DIAGNOSIS — R1084 Generalized abdominal pain: Secondary | ICD-10-CM

## 2021-08-25 ENCOUNTER — Telehealth: Payer: Self-pay | Admitting: *Deleted

## 2021-08-25 NOTE — Telephone Encounter (Signed)
No return call received procedure cancelled and no show letter sent. °

## 2021-08-25 NOTE — Telephone Encounter (Signed)
Interpreter attempted to call x3 message left to return call by 5 pm to reschedule pre-visit or procedure will be cancelled. ?

## 2021-09-07 ENCOUNTER — Encounter: Payer: Medicaid Other | Admitting: Gastroenterology

## 2021-09-29 ENCOUNTER — Encounter (HOSPITAL_COMMUNITY): Payer: Self-pay | Admitting: Pharmacy Technician

## 2021-09-29 ENCOUNTER — Emergency Department (HOSPITAL_COMMUNITY): Payer: Medicare Other

## 2021-09-29 ENCOUNTER — Other Ambulatory Visit: Payer: Self-pay

## 2021-09-29 ENCOUNTER — Emergency Department (HOSPITAL_COMMUNITY)
Admission: EM | Admit: 2021-09-29 | Discharge: 2021-09-29 | Disposition: A | Discharge status: Home / Self Care | Payer: Medicare Other | Attending: Emergency Medicine | Admitting: Emergency Medicine

## 2021-09-29 DIAGNOSIS — Z794 Long term (current) use of insulin: Secondary | ICD-10-CM | POA: Diagnosis not present

## 2021-09-29 DIAGNOSIS — M5432 Sciatica, left side: Secondary | ICD-10-CM

## 2021-09-29 DIAGNOSIS — M5442 Lumbago with sciatica, left side: Secondary | ICD-10-CM | POA: Diagnosis not present

## 2021-09-29 DIAGNOSIS — M545 Low back pain, unspecified: Secondary | ICD-10-CM | POA: Diagnosis present

## 2021-09-29 MED ORDER — PREDNISONE 20 MG PO TABS: 40.0000 mg | ORAL_TABLET | Freq: Every day | ORAL | 0 refills | Status: DC

## 2021-09-29 MED ORDER — METHOCARBAMOL 500 MG PO TABS
500.0000 mg | ORAL_TABLET | Freq: Once | ORAL | Status: AC
  Administered 2021-09-29: 500 mg via ORAL
  Filled 2021-09-29: qty 1

## 2021-09-29 MED ORDER — METHOCARBAMOL 500 MG PO TABS: 500.0000 mg | ORAL_TABLET | Freq: Three times a day (TID) | ORAL | 0 refills | Status: AC | PRN

## 2021-09-29 MED ORDER — PREDNISONE 20 MG PO TABS: 40.0000 mg | ORAL_TABLET | Freq: Every day | ORAL | 0 refills | Status: AC

## 2021-09-29 MED ORDER — METHOCARBAMOL 1000 MG/10ML IJ SOLN
1000.0000 mg | Freq: Once | INTRAMUSCULAR | Status: DC
  Filled 2021-09-29: qty 10

## 2021-09-29 MED ORDER — METHOCARBAMOL 500 MG PO TABS: 500.0000 mg | ORAL_TABLET | Freq: Three times a day (TID) | ORAL | 0 refills | Status: DC | PRN

## 2021-09-29 MED ORDER — OXYCODONE-ACETAMINOPHEN 5-325 MG PO TABS
1.0000 | ORAL_TABLET | Freq: Once | ORAL | Status: AC
Start: 2021-09-29 — End: 2021-09-29
  Administered 2021-09-29: 1 via ORAL
  Filled 2021-09-29: qty 1

## 2021-09-29 NOTE — ED Provider Notes (Signed)
?[facility] ?Provider Note ? ? ?CSN: [id number] ?Arrival date & time: 09/29/21  4709 ? ?  ? ?History ? ?Chief Complaint  ?Patient presents with  ? Leg Pain  ? Back Pain  ? ? ?Shawn Patton is a 66 y.o. male. ? ?Utilize language line interpreter throughout entirety of visit.  Patient is Nepali speaking only.  Some additional history was obtained from son at bedside who does speak [place]. ? ?Patient reports that for the about a month he has been having some low back pain, seems to be getting worse over the last few days.  Pain is concentrated in the lower lumbar back and radiates to his left leg.  Has some associated tingling and numb sensation at times in his left leg.  He denies any weakness in either of his extremities.  No bladder or bowel incontinence, no urinary retention, no fevers or chills.  Has been able to walk. ? ?HPI ? ?  ? ?Home Medications ?Prior to Admission medications   ?Medication Sig Start Date End Date Taking? Authorizing Provider  ?methocarbamol (ROBAXIN) 500 MG tablet Take 1 tablet (500 mg total) by mouth every 8 (eight) hours as needed for muscle spasms. 09/29/21  Yes Lucrezia Starch, MD  ?predniSONE (DELTASONE) 20 MG tablet Take 2 tablets (40 mg total) by mouth daily for 5 days. 09/29/21 10/04/21 Yes Lucrezia Starch, MD  ?Blood Glucose Monitoring Suppl (TRUE METRIX METER) w/Device KIT 1 each by Does not apply route as needed. 11/15/15   Funches, Adriana Mccallum, MD  ?DULoxetine (CYMBALTA) 30 MG capsule Take 30 mg by mouth daily.    [provider]  ?glucose blood (TRUE METRIX BLOOD GLUCOSE TEST) test strip 1 each by Other route 3 (three) times daily. 11/15/15   Funches, Adriana Mccallum, MD  ?insulin glargine (LANTUS) 100 UNIT/ML Solostar Pen Inject into the skin daily.    [provider]  ?omeprazole (PRILOSEC) 20 MG capsule TAKE 1 CAPSULE(20 MG) BY MOUTH DAILY 08/05/21   Daryel November, MD  ?TRUEPLUS LANCETS 28G MISC 1 each by Does not apply route 3  (three) times daily. 11/15/15   Boykin Nearing, MD  ?   ? ?Allergies    ?Patient has no known allergies.   ? ?Review of Systems   ?Review of Systems  ?Constitutional:  Negative for chills and fever.  ?HENT:  Negative for ear pain and sore throat.   ?Eyes:  Negative for pain and visual disturbance.  ?Respiratory:  Negative for cough and shortness of breath.   ?Cardiovascular:  Negative for chest pain and palpitations.  ?Gastrointestinal:  Negative for abdominal pain and vomiting.  ?Genitourinary:  Negative for dysuria and hematuria.  ?Musculoskeletal:  Negative for arthralgias and back pain.  ?Skin:  Negative for color change and rash.  ?Neurological:  Positive for numbness. Negative for seizures and syncope.  ?All other systems reviewed and are negative. ? ?Physical Exam ?Updated Vital Signs ?BP (!) 145/73 (BP Location: Right Arm)   Pulse 68   Temp 98.6 ?F (37 ?C) (Oral)   Resp 18   SpO2 97%  ?Physical Exam ?Vitals and nursing note reviewed.  ?Constitutional:   ?   General: He is not in acute distress. ?   Appearance: He is well-developed.  ?HENT:  ?   Head: Normocephalic and atraumatic.  ?Eyes:  ?   Conjunctiva/sclera: Conjunctivae normal.  ?Cardiovascular:  ?   Rate and Rhythm: Normal rate and regular rhythm.  ?   Heart sounds: No  murmur heard. ?Pulmonary:  ?   Effort: Pulmonary effort is normal. No respiratory distress.  ?   Breath sounds: Normal breath sounds.  ?Abdominal:  ?   Palpations: Abdomen is soft.  ?   Tenderness: There is no abdominal tenderness.  ?Musculoskeletal:     ?   General: No swelling.  ?   Cervical back: Neck supple.  ?   Comments: Some tenderness to the lumbar spine, there is no step-off or deformity; no tenderness palpation throughout either leg, legs appear normal, normal DP and PT pulses in both of his legs  ?Skin: ?   General: Skin is warm and dry.  ?   Capillary Refill: Capillary refill takes less than 2 seconds.  ?Neurological:  ?   Mental Status: He is alert.  ?   Comments: 5 out  of 5 strength throughout entirety of both lower extremities; sensation to light touch and pinprick is intact throughout entirety of both lower extremities  ?Psychiatric:     ?   Mood and Affect: Mood normal.  ? ? ?ED Results / Procedures / Treatments   ?Labs ?(all labs ordered are listed, but only abnormal results are displayed) ?Labs Reviewed - No data to display ? ?EKG ?None ? ?Radiology ?DG Lumbar Spine Complete ? ?Result Date: 09/29/2021 ?CLINICAL DATA:  Low back pain for 1 month. EXAM: LUMBAR SPINE - COMPLETE 4+ VIEW COMPARISON:  None. FINDINGS: Normal alignment of the lumbar vertebral bodies. Disc spaces and vertebral bodies are maintained. No acute bony findings. The facets are normally aligned. No pars defects. The bony pelvis is intact. The pubic symphysis and SI joints appear. IMPRESSION: Normal alignment and no acute bony findings or significant degenerative changes. Electronically Signed   By: Marijo Sanes M.D.   On: 09/29/2021 09:44   ? ?Procedures ?Procedures  ? ? ?Medications Ordered in ED ?Medications  ?oxyCODONE-acetaminophen (PERCOCET/ROXICET) 5-325 MG per tablet 1 tablet (1 tablet Oral Given 09/29/21 0931)  ?methocarbamol (ROBAXIN) tablet 500 mg (500 mg Oral Given 09/29/21 0931)  ? ? ?ED Course/ Medical Decision Making/ A&P ?  ?                        ?Medical Decision Making ?Amount and/or Complexity of Data Reviewed ?Radiology: ordered. ? ?Risk ?Prescription drug management. ? ? ?66 year old gentleman presenting to the emergency room with concern for low back pain radiating down left leg.  On exam he is well-appearing in no distress, he is neuro intact.  Had endorsed tingling/numb sensation at times in his left leg but sensation to both pinprick and light touch is intact in the entirety of the extremity and the strength is equal in both legs.  Good distal pulses in both legs.  Suspect MSK strain versus sciatica.  Check plain films, I independently reviewed, no acute findings.  Symptoms  significantly improved after receiving Percocet and Robaxin.  Recommend course of steroids, follow-up with PCP.  I reviewed return precautions both with patient and son. ? ? ? ?After the discussed management above, the patient was determined to be safe for discharge.  The patient was in agreement with this plan and all questions regarding their care were answered.  ED return precautions were discussed and the patient will return to the ED with any significant worsening of condition. ? ? ? ? ? ? ? ? ?Final Clinical Impression(s) / ED Diagnoses ?Final diagnoses:  ?Sciatica of left side  ? ? ?Rx / DC Orders ?ED Discharge  Orders   ? ?      Ordered  ?  methocarbamol (ROBAXIN) 500 MG tablet  Every 8 hours PRN       ? 09/29/21 1030  ?  predniSONE (DELTASONE) 20 MG tablet  Daily       ? 09/29/21 1030  ? ?  ?  ? ?  ? ? ?  ?Lucrezia Starch, MD ?09/29/21 1031 ? ?

## 2021-09-29 NOTE — Discharge Instructions (Signed)
Please follow-up with your primary care doctor.  Recommend taking Tylenol and muscle relaxer as needed for pain.  Recommend taking steroids as prescribed.  If you are having increasing pain, any leg swelling, numbness, weakness, bladder or bowel incontinence or other new concerning symptom, please come back to ER for reassessment. ?

## 2021-09-29 NOTE — ED Triage Notes (Signed)
Pt here with low back pain radiating down his left leg X1 month with worsening today.  ?

## 2023-02-22 ENCOUNTER — Ambulatory Visit: Admit: 2023-02-22 | Discharge: 2023-02-22 | Payer: MEDICARE | Attending: Internal Medicine | Primary: Internal Medicine

## 2023-02-22 VITALS — BP 122/64 | HR 86 | Temp 98.20000°F | Resp 26 | Ht 62.0 in | Wt 151.8 lb

## 2023-02-22 DIAGNOSIS — R053 Chronic cough: Secondary | ICD-10-CM

## 2023-02-22 LAB — POCT GLYCOSYLATED HEMOGLOBIN (HGB A1C): Hemoglobin A1C: 6.1 %

## 2023-02-22 MED ORDER — MAGNESIUM GLUCONATE 500 MG PO TABS
500 | ORAL_TABLET | Freq: Two times a day (BID) | ORAL | 5 refills | Status: DC
Start: 2023-02-22 — End: 2023-08-14

## 2023-02-22 MED ORDER — UBRELVY 100 MG PO TABS
100 | ORAL_TABLET | Freq: Every day | ORAL | 5 refills | Status: DC | PRN
Start: 2023-02-22 — End: 2023-08-27

## 2023-02-22 MED ORDER — LEVOTHYROXINE SODIUM 75 MCG PO TABS
75 | ORAL_TABLET | Freq: Every day | ORAL | 11 refills | Status: DC
Start: 2023-02-22 — End: 2023-03-01

## 2023-02-22 MED ORDER — ALBUTEROL SULFATE HFA 108 (90 BASE) MCG/ACT IN AERS
108 | Freq: Four times a day (QID) | RESPIRATORY_TRACT | 1 refills | Status: DC | PRN
Start: 2023-02-22 — End: 2023-08-23

## 2023-02-22 MED ORDER — AMLODIPINE BESYLATE 10 MG PO TABS
10 | ORAL_TABLET | Freq: Every day | ORAL | 11 refills | Status: DC
Start: 2023-02-22 — End: 2023-03-01

## 2023-02-22 MED ORDER — VITAMIN D (ERGOCALCIFEROL) 1.25 MG (50000 UT) PO CAPS
1.25 | ORAL_CAPSULE | ORAL | 1 refills | Status: DC
Start: 2023-02-22 — End: 2024-01-14

## 2023-02-22 MED ORDER — FLUTICASONE-SALMETEROL 250-50 MCG/ACT IN AEPB
250-50 | Freq: Two times a day (BID) | RESPIRATORY_TRACT | 3 refills | Status: DC
Start: 2023-02-22 — End: 2023-03-01

## 2023-02-22 MED ORDER — TOPIRAMATE 100 MG PO TABS
100 | ORAL_TABLET | Freq: Every evening | ORAL | 5 refills | 90.00000 days | Status: DC
Start: 2023-02-22 — End: 2024-02-26

## 2023-02-22 MED ORDER — PANTOPRAZOLE SODIUM 40 MG PO TBEC
40 | ORAL_TABLET | Freq: Every day | ORAL | 1 refills | Status: DC
Start: 2023-02-22 — End: 2023-03-01

## 2023-02-22 MED ORDER — PRAVASTATIN SODIUM 40 MG PO TABS
40 | ORAL_TABLET | Freq: Every evening | ORAL | 11 refills | Status: DC
Start: 2023-02-22 — End: 2023-03-02

## 2023-02-22 NOTE — Assessment & Plan Note (Signed)
stable on Ubrelvy and Topamax.  Patient also takes magnesium supplement.  Referred to new neurologist

## 2023-02-22 NOTE — Assessment & Plan Note (Signed)
Stable  Continue anti-hypertensive medication as documented in your medication list amlodipine 10 mg daily  To verify blood pressure cuff accuracy  To keep outpatient BP log,  counseled on exercise and diet (including DASH diet)  Goal to achieve appropriate BMI  Patient agreed with plan with verbal understanding

## 2023-02-22 NOTE — Assessment & Plan Note (Signed)
A1C:   Lab Results   Component Value Date/Time    LABA1C 6.1 02/22/2023 02:38 PM     Stable- continue home blood sugar monitoring  Counselled on Diet and exercise with goal to achieve appropriate Patient agreed with plan with verbal understanding

## 2023-02-22 NOTE — Assessment & Plan Note (Signed)
stable on Pravachol 40 mg nightly

## 2023-02-22 NOTE — Consults (Signed)
 Session ID: [id number]  Language: Nepali  Interpreter ID: [id number]  Interpreter Name: Ashley Akin

## 2023-02-22 NOTE — Assessment & Plan Note (Signed)
stable on levothyroxine 75 mcg patient due for blood work

## 2023-02-22 NOTE — Assessment & Plan Note (Signed)
 patient is to see pulmonologist was on albuterol and Advair.  Follow-up with specialist

## 2023-02-22 NOTE — Assessment & Plan Note (Signed)
stable on Protonix 40 mg daily.

## 2023-02-22 NOTE — Progress Notes (Signed)
 Michael Knight (DOB:  May 12, 1956) is a 67 y.o. male,New patient, here for evaluation of the following chief complaint(s):  Cough and Hypertension      SUBJECTIVE/OBJECTIVE:  HPI    This is a 67 y.o. male patient who comes in for establishment of care. The patient has a past medical history of -    1. Chronic cough/history of latent TB-patient has a history of the same recently returned from [place] .  In 2022 patient was seen by pulmonologist here in [place]  [facility].  Patient comes in today requested refills of his breathing is pretty quick still has persistent cough.  Patient said he was treated for latent TB in the past.  Would like to reestablish with pulmonology    2.  HTN - Patient blood pressure is stable today. Patient denies complaints or symptoms today. Patient mentions he is adherent with treatment. Patient denies chest pain or shortness of breath with exertion or at rest. Adherent to low salt diet.        3.  Hypothyroidism -patient denies any complaints today he is adherent to medication.  No constipation no hair loss or additional weight gain.    4.  Migraine headaches -patient at his baseline denies headache today continues to take prescription medication.  Recently moved from [place]  he used to see a neurologist and would like to establish with 1 here in [place].    5.  Gastroesophageal Reflux Disease - Patient denies complaints or symptoms of reflux, acid/waterbrash.  Patient mentions they are adherent with treatment.  Patient tries to avoid food triggers.   The ASCVD Risk score (Arnett DK, et al., 2019) failed to calculate for the following reasons:    Cannot find a previous HDL lab    Cannot find a previous total cholesterol lab     Health Maintenance Due   Topic Date Due    Depression Screen  Never done    Hepatitis C screen  Never done    DTaP/Tdap/Td vaccine (1 - Tdap) Never done    Colorectal Cancer Screen  Never done    Shingles vaccine (1 of 2) Never done    Respiratory Syncytial Virus  (RSV) Pregnant or age 75 yrs+ (1 - 1-dose 60+ series) Never done    Lipids  02/09/2019    Pneumococcal 65+ years Vaccine (1 of 1 - PCV) Never done    AAA screen  06/12/2021    Flu vaccine (1) 01/11/2023    COVID-19 Vaccine (4 - 2023-24 season) 02/11/2023      Social History     Socioeconomic History    Marital status: Unknown     Spouse name: Not on file    Number of children: Not on file    Years of education: Not on file    Highest education level: Not on file   Occupational History    Not on file   Tobacco Use    Smoking status: Former    Smokeless tobacco: Never   Vaping Use    Vaping status: Never Used   Substance and Sexual Activity    Alcohol use: Not Currently    Drug use: Never    Sexual activity: Not Currently   Other Topics Concern    Not on file   Social History Narrative    Not on file     Social Determinants of Health     Financial Resource Strain: Low Risk  (02/22/2023)    Overall Financial Resource Strain (CARDIA)  Difficulty of Paying Living Expenses: Not hard at all   Food Insecurity: No Food Insecurity (02/22/2023)    Hunger Vital Sign     Worried About Running Out of Food in the Last Year: Never true     Ran Out of Food in the Last Year: Never true   Transportation Needs: Unknown (02/22/2023)    PRAPARE - Therapist, Art (Medical): Not on file     Lack of Transportation (Non-Medical): No   Physical Activity: Not on file   Stress: Not on file   Social Connections: Not on file   Intimate Partner Violence: Not on file   Housing Stability: Unknown (02/22/2023)    Housing Stability Vital Sign     Unable to Pay for Housing in the Last Year: Not on file     Number of Times Moved in the Last Year: Not on file     Homeless in the Last Year: No      Past Medical History:   Diagnosis Date    Asthma     Back pain     Dyslipidemia     GERD (gastroesophageal reflux disease)     Hyperlipidemia     Hypertension     Hypothyroidism       Past Surgical History:   Procedure Laterality Date     BRONCHOSCOPY N/A 10/28/2020    BRONCHOSCOPY ALVEOLAR LAVAGE performed by Laurance VEAR Leu, MD at [facility] ENDOSCOPY    ELBOW SURGERY Right     HAND SURGERY      UPPER GASTROINTESTINAL ENDOSCOPY N/A 02/08/2018    EGD BIOPSY performed by Garnette SHAUNNA Lunger, MD at [facility] ENDOSCOPY      Current Outpatient Medications   Medication Sig Dispense Refill    meloxicam  (MOBIC ) 7.5 MG tablet Take 1 tablet by mouth 2 times daily      albuterol  sulfate HFA (VENTOLIN  HFA) 108 (90 Base) MCG/ACT inhaler Inhale 2 puffs into the lungs 4 times daily as needed for Wheezing 54 g 1    pantoprazole  (PROTONIX ) 40 MG tablet Take 1 tablet by mouth every morning (before breakfast) 90 tablet 1    fluticasone -salmeterol (ADVAIR  DISKUS) 250-50 MCG/ACT AEPB diskus inhaler Inhale 1 puff into the lungs in the morning and 1 puff in the evening. 60 each 3    amLODIPine  (NORVASC ) 10 MG tablet Take 1 tablet by mouth daily 30 tablet 11    levothyroxine  (SYNTHROID ) 75 MCG tablet Take 1 tablet by mouth Daily 30 tablet 11    pravastatin  (PRAVACHOL ) 40 MG tablet Take 1 tablet by mouth nightly 30 tablet 11    topiramate  (TOPAMAX ) 100 MG tablet Take 1 tablet by mouth nightly 60 tablet 5    vitamin D  (ERGOCALCIFEROL ) 1.25 MG (50000 UT) CAPS capsule Take 1 capsule by mouth Once a week at 5 PM 12 capsule 1    Ubrogepant  (UBRELVY ) 100 MG TABS Take 100 mg by mouth daily as needed (migraine headaches) 16 tablet 5    magnesium  gluconate (MAGONATE) 500 MG tablet Take 1 tablet by mouth 2 times daily 60 tablet 5    traZODone  (DESYREL ) 50 MG tablet Take 1 tablet by mouth nightly 1 and 1/2 tabs       No current facility-administered medications for this visit.      No Known Allergies     Review of Systems   Constitutional:  Negative for activity change, appetite change, chills, fatigue, fever and unexpected weight change.  HENT:  Negative for congestion, ear pain, postnasal drip, sinus pressure, sore throat, tinnitus and trouble swallowing.    Eyes:  Negative  for pain and redness.   Respiratory:  Negative for cough, chest tightness, shortness of breath and wheezing.    Cardiovascular:  Negative for chest pain, palpitations and leg swelling.   Gastrointestinal:  Negative for abdominal distention, abdominal pain, blood in stool, constipation, diarrhea, nausea and vomiting.   Endocrine: Negative for cold intolerance, heat intolerance and polydipsia.   Genitourinary:  Negative for decreased urine volume, dysuria, flank pain, frequency, hematuria and urgency.   Musculoskeletal:  Negative for arthralgias, back pain, joint swelling, neck pain and neck stiffness.   Skin:  Negative for color change and rash.   Neurological:  Negative for dizziness, weakness, numbness and headaches.   Hematological:  Negative for adenopathy.   Psychiatric/Behavioral:  Negative for behavioral problems, sleep disturbance and suicidal ideas. The patient is not nervous/anxious.        BP 122/64 (Site: Left Upper Arm, Position: Sitting, Cuff Size: Medium Adult)   Pulse 86   Temp 98.2 F (36.8 C)   Resp 26   Ht 1.575 m (5' 2)   Wt 68.9 kg (151 lb 12.8 oz)   SpO2 99%   BMI 27.76 kg/m    Physical Exam  Constitutional:       General: He is not in acute distress.     Appearance: Normal appearance. He is not ill-appearing.   HENT:      Head: Normocephalic and atraumatic.      Right Ear: Ear canal normal.      Left Ear: Ear canal normal.      Mouth/Throat:      Mouth: Mucous membranes are moist.      Pharynx: No oropharyngeal exudate or posterior oropharyngeal erythema.   Eyes:      Extraocular Movements: Extraocular movements intact.      Conjunctiva/sclera: Conjunctivae normal.      Pupils: Pupils are equal, round, and reactive to light.   Neck:      Vascular: No carotid bruit.   Cardiovascular:      Rate and Rhythm: Normal rate and regular rhythm.      Pulses: Normal pulses.      Heart sounds: Normal heart sounds. No murmur heard.     No gallop.   Pulmonary:      Effort: Pulmonary effort is  normal.      Breath sounds: Normal breath sounds. No wheezing, rhonchi or rales.   Abdominal:      General: Abdomen is flat. There is no distension.   Musculoskeletal:         General: No swelling or tenderness. Normal range of motion.      Cervical back: No tenderness.   Lymphadenopathy:      Cervical: No cervical adenopathy.   Skin:     Findings: No erythema, lesion or rash.   Neurological:      General: No focal deficit present.      Mental Status: He is alert and oriented to person, place, and time. Mental status is at baseline.      Cranial Nerves: No cranial nerve deficit.      Motor: No weakness.   Psychiatric:         Mood and Affect: Mood normal.         Behavior: Behavior normal.         Thought Content: Thought content normal.  Judgment: Judgment normal.         ASSESSMENT/PLAN:  1. Chronic cough  Assessment & Plan:    patient is to see pulmonologist was on albuterol  and Advair .  Follow-up with specialist  Orders:  -     XR CHEST (2 VW); Future  -     [place] - Lanning Art, MD, Pulmonary, [place]  -     albuterol  sulfate HFA (VENTOLIN  HFA) 108 (90 Base) MCG/ACT inhaler; Inhale 2 puffs into the lungs 4 times daily as needed for Wheezing, Disp-54 g, R-1Normal  -     fluticasone -salmeterol (ADVAIR  DISKUS) 250-50 MCG/ACT AEPB diskus inhaler; Inhale 1 puff into the lungs in the morning and 1 puff in the evening., Disp-60 each, R-3Rinse mouth out with water  after each administrationNormal  2. Latent tuberculosis  Assessment & Plan:    for patient s/p treatment in the past.  He will follow-up with his lung doctor  Orders:  -     [place] - Lanning Art, MD, Pulmonary, [place]  3. Essential hypertension  Assessment & Plan:   Stable  Continue anti-hypertensive medication as documented in your medication list amlodipine  10 mg daily  To verify blood pressure cuff accuracy  To keep outpatient BP log,  counseled on exercise and diet (including DASH diet)  Goal to achieve  appropriate BMI  Patient agreed with plan with verbal understanding   Orders:  -     amLODIPine  (NORVASC ) 10 MG tablet; Take 1 tablet by mouth daily, Disp-30 tablet, R-11Normal  4. Acquired hypothyroidism  Assessment & Plan:    stable on levothyroxine  75 mcg patient due for blood work  Orders:  -     levothyroxine  (SYNTHROID ) 75 MCG tablet; Take 1 tablet by mouth Daily, Disp-30 tablet, R-11Normal  5. Gastroesophageal reflux disease without esophagitis  Assessment & Plan:    stable on Protonix  40 mg daily  Orders:  -     pantoprazole  (PROTONIX ) 40 MG tablet; Take 1 tablet by mouth every morning (before breakfast), Disp-90 tablet, R-1Normal  6. Dyslipidemia  Assessment & Plan:    stable on Pravachol  40 mg nightly  Orders:  -     pravastatin  (PRAVACHOL ) 40 MG tablet; Take 1 tablet by mouth nightly, Disp-30 tablet, R-11Normal  7. Prediabetes  Assessment & Plan:   A1C:   Lab Results   Component Value Date/Time    LABA1C 6.1 02/22/2023 02:38 PM     Stable- continue home blood sugar monitoring  Counselled on Diet and exercise with goal to achieve appropriate Patient agreed with plan with verbal understanding     Orders:  -     POCT glycosylated hemoglobin (Hb A1C)  8. Migraine with aura and without status migrainosus, not intractable  Assessment & Plan:    stable on Ubrelvy  and Topamax .  Patient also takes magnesium  supplement.  Referred to new neurologist  Orders:  -     topiramate  (TOPAMAX ) 100 MG tablet; Take 1 tablet by mouth nightly, Disp-60 tablet, R-5Normal  -     Ubrogepant  (UBRELVY ) 100 MG TABS; Take 100 mg by mouth daily as needed (migraine headaches), Disp-16 tablet, R-5Normal  -     [place] - [place], [place], MD, Neurology, [place]  -     magnesium  gluconate (MAGONATE) 500 MG tablet; Take 1 tablet by mouth 2 times daily, Disp-60 tablet, R-5Normal  9. Vitamin D  deficiency  -     vitamin D  (ERGOCALCIFEROL ) 1.25 MG (50000 UT) CAPS capsule; Take 1 capsule by mouth Once a  week at 5 PM, Disp-12 capsule,  R-1Normal      Given referral and will make appointment to specialist.    Preventative care discussed.  Encouraged healthy diet choices, reg exercise. Patient agreed w/plan and verbal understanding. Patient advised to call or return with any concerns or problems or worsening conditions. Go to the ER for any severe or potentially life threatening problems.    Return in about 1 week (around 03/01/2023) for Medicare AWV.     This note was generated in part or in whole with voice recognition software.  Voice recognition is usually quite accurate but there are transcription errors that can often occur.  All attempts were made to correct these errors I apologized for any  typographical errors that were not detected and corrected.     Electronically signed by Cara KIDD. Vannie, MD on 02/22/2023 at 3:58 PM.

## 2023-02-22 NOTE — Assessment & Plan Note (Signed)
 for patient s/p treatment in the past.  He will follow-up with his lung doctor

## 2023-02-22 NOTE — Progress Notes (Signed)
 [id number]   Michael Knight #[id number]

## 2023-02-23 ENCOUNTER — Inpatient Hospital Stay: Payer: MEDICARE | Primary: Internal Medicine

## 2023-02-23 ENCOUNTER — Inpatient Hospital Stay: Admit: 2023-02-23 | Payer: MEDICARE | Primary: Internal Medicine

## 2023-02-23 DIAGNOSIS — R053 Chronic cough: Secondary | ICD-10-CM

## 2023-03-01 ENCOUNTER — Ambulatory Visit: Admit: 2023-03-01 | Discharge: 2023-03-01 | Payer: MEDICARE | Attending: Internal Medicine | Primary: Internal Medicine

## 2023-03-01 ENCOUNTER — Encounter

## 2023-03-01 DIAGNOSIS — Z0000 Encounter for general adult medical examination without abnormal findings: Secondary | ICD-10-CM

## 2023-03-01 MED ORDER — MELOXICAM 7.5 MG PO TABS
7.5 | ORAL_TABLET | Freq: Two times a day (BID) | ORAL | 3 refills | Status: DC
Start: 2023-03-01 — End: 2024-01-17

## 2023-03-01 MED ORDER — LEVOTHYROXINE SODIUM 75 MCG PO TABS
75 | ORAL_TABLET | Freq: Every day | ORAL | 11 refills | Status: DC
Start: 2023-03-01 — End: 2023-03-02

## 2023-03-01 MED ORDER — FLUTICASONE-SALMETEROL 250-50 MCG/ACT IN AEPB
250-50 | Freq: Two times a day (BID) | RESPIRATORY_TRACT | 3 refills | Status: DC
Start: 2023-03-01 — End: 2024-01-14

## 2023-03-01 MED ORDER — AMLODIPINE BESYLATE 10 MG PO TABS
10 | ORAL_TABLET | Freq: Every day | ORAL | 11 refills | Status: DC
Start: 2023-03-01 — End: 2024-03-19

## 2023-03-01 MED ORDER — MECLIZINE HCL 12.5 MG PO TABS
12.5 | ORAL_TABLET | Freq: Three times a day (TID) | ORAL | 3 refills | Status: AC | PRN
Start: 2023-03-01 — End: 2023-06-29

## 2023-03-01 MED ORDER — PROPRANOLOL HCL 40 MG PO TABS
40 | ORAL_TABLET | Freq: Every day | ORAL | 5 refills | Status: AC
Start: 2023-03-01 — End: ?

## 2023-03-01 MED ORDER — PANTOPRAZOLE SODIUM 40 MG PO TBEC
40 | ORAL_TABLET | Freq: Every day | ORAL | 1 refills | Status: DC
Start: 2023-03-01 — End: 2024-01-17

## 2023-03-01 MED ORDER — TRAZODONE HCL 50 MG PO TABS
50 MG | ORAL_TABLET | Freq: Every evening | ORAL | 5 refills | Status: DC
Start: 2023-03-01 — End: 2023-05-28

## 2023-03-01 NOTE — Assessment & Plan Note (Signed)
Stable  Continue anti-hypertensive medication as documented in your medication list amlodipine 10 mg daily  To verify blood pressure cuff accuracy  To keep outpatient BP log,  counseled on exercise and diet (including DASH diet)  Goal to achieve appropriate BMI  Patient agreed with plan with verbal understanding

## 2023-03-01 NOTE — Assessment & Plan Note (Signed)
stable on Pravachol 40 mg nightly

## 2023-03-01 NOTE — Assessment & Plan Note (Signed)
Patient comes in for Medicare AWV.   we discussed age appropriate USPSTF screens  Over 75% of the visit was spent counselling patient on appropriate lifestyle choices.

## 2023-03-01 NOTE — Patient Instructions (Signed)
Preventing Falls: Care Instructions  Injuries and health problems such as trouble walking or poor eyesight can increase your risk of falling. So can some medicines. But there are things you can do to help prevent falls. You can exercise to get stronger. You can also arrange your home to make it safer.    Talk to your doctor about the medicines you take. Ask if any of them increase the risk of falls and whether they can be changed or stopped.   Try to exercise regularly. It can help improve your strength and balance. This can help lower your risk of falling.         Practice fall safety and prevention.   Wear low-heeled shoes that fit well and give your feet good support. Talk to your doctor if you have foot problems that make this hard.  Carry a cellphone or wear a medical alert device that you can use to call for help.  Use stepladders instead of chairs to reach high objects. Don't climb if you're at risk for falls. Ask for help, if needed.  Wear the correct eyeglasses, if you need them.        Make your home safer.   Remove rugs, cords, clutter, and furniture from walkways.  Keep your house well lit. Use night-lights in hallways and bathrooms.  Install and use sturdy handrails on stairways.  Wear nonskid footwear, even inside. Don't walk barefoot or in socks without shoes.        Be safe outside.   Use handrails, curb cuts, and ramps whenever possible.  Keep your hands free by using a shoulder bag or backpack.  Try to walk in well-lit areas. Watch out for uneven ground, changes in pavement, and debris.  Be careful in the winter. Walk on the grass or gravel when sidewalks are slippery. Use de-icer on steps and walkways. Add non-slip devices to shoes.    Put grab bars and nonskid mats in your shower or tub and near the toilet. Try to use a shower chair or bath bench when bathing.   Get into a tub or shower by putting in your weaker leg first. Get out with your strong side first. Have a phone or medical alert  device in the bathroom with you.   Where can you learn more?  Go to [employer] and enter G117 to learn more about "Preventing Falls: Care Instructions."  Current as of: December 26, 2021  Content Version: 14.1   2006-2024 [employer].   Care instructions adapted under license by [facility]. If you have questions about a medical condition or this instruction, always ask your healthcare professional. Health[employer], Incorporated disclaims any warranty or liability for your use of this information.           Learning About Emotional Support  When do you need emotional support?     You might find getting support from others helpful when you have a long-term health problem. Often people feel alone, confused, or scared when coping with an illness. But you aren't alone. Other people are going through the same thing you are and know how you feel.  Talking with others about your feelings can help you feel better.  Your family and friends can give you support. So can your doctor, a support group, or a church. If you have a support network, you will not feel as alone. You will learn new ways to deal with your situation, and you may try harder to overcome it.  Where you can get support  Family and friends: They can help you cope by giving you comfort and encouragement.  Counseling: Professional counseling can help you cope with situations that interfere with your life and cause stress. Counseling can help you understand and deal with your illness.  Your doctor: Find a doctor you trust and feel comfortable with. Be open and honest about your fears and concerns. Your doctor can help you get the right medical treatments, including counseling.  Spiritual or religious groups: They can provide comfort and may be able to help you find counseling or other social support services.  Social groups: They can help you meet new people and get involved in activities you enjoy.  Community support groups: In a  support group, you can talk to others who have dealt with the same problems or illness as you. You can encourage one another and learn ways to cope with tough emotions.  How can you find a support group?  Finding a support group that works for you may take time. There are many options. Some groups have a group leader who helps lead discussions or shares information. Others are less formal. Some meet in person, while others meet online.  Try using these resources to help you find the best support group for you.  Your doctor, health care team, or counselor.  People with the same health concern.  Your local church, mosque, synagogue, or other religious group.  A city, state, or national group that provides support for your health concern. Check your local library or community center for a list of these groups. Or look for information online.  Your local community, friends, and family.  Supportive relationships  A supportive relationship includes emotional support such as love, trust, and understanding, as well as advice and concrete help, such as help managing your time.  Reach out to others  Family and friends can help you. Ask them to:  Listen to you and give you encouragement. This can keep you from feeling hopeless or alone.  Help with small daily tasks or with bigger problems. A helping hand can keep you from feeling overwhelmed.  Help you manage a health problem. For example, ask them to go to doctor visits with you. Your loved ones can offer support by being involved in your medical care.  Respect your relationships  A good relationship is also a two-way street. You count on help from others, but they also count on you.  Know your friends' limits. You don't have to see or call your friends every day. If you are going through a rough patch, ask friends if you can contact them outside of the usual boundaries.  Don't always complain or talk about yourself. Know when it's time to stop talking and listen or just  enjoy your friend's company.  Know that good friends can be a bad influence. For example, if a friend encourages you to drink when you know it will harm you, you may want to end the friendship.  Where can you learn more?  Go to [employer] and enter G092 to learn more about "Learning About Emotional Support."  Current as of: December 03, 2021  Content Version: 14.1   2006-2024 [employer], Incorporated.   Care instructions adapted under license by [facility]. If you have questions about a medical condition or this instruction, always ask your healthcare professional. Healthwise, Incorporated disclaims any warranty or liability for your use of this information.  Learning About Vision Tests  What are vision tests?     The four most common vision tests are visual acuity tests, refraction, visual field tests, and color vision tests.  Visual acuity (sharpness) tests  These tests are used:  To see if you need glasses or contact lenses.  To monitor an eye problem.  To check an eye injury.  Visual acuity tests are done as part of routine exams. You may also have this test when you get your driver's license or apply for some types of jobs.  Visual field tests  These tests are used:  To check for vision loss in any area of your range of vision.  To screen for certain eye diseases.  To look for nerve damage after a stroke, head injury, or other problem that could reduce blood flow to the brain.  Refraction and color tests  A refraction test is done to find the right prescription for glasses and contact lenses.  A color vision test is done to check for color blindness.  Color vision is often tested as part of a routine exam. You may also have this test when you apply for a job where recognizing different colors is important, such as truck driving, Optician, dispensing, or the [employer].  How are vision tests done?  Visual acuity test   You cover one eye at a time.  You read aloud from a wall chart across  the room.  You read aloud from a small card that you hold in your hand.  Refraction   You look into a special device.  The device puts lenses of different strengths in front of each eye to see how strong your glasses or contact lenses need to be.  Visual field tests   Your doctor may have you look through special machines.  Or your doctor may simply have you stare straight ahead while they move a finger into and out of your field of vision.  Color vision test   You look at pieces of printed test patterns in various colors. You say what number or symbol you see.  Your doctor may have you trace the number or symbol using a pointer.  How do these tests feel?  There is very little chance of having a problem from this test. If dilating drops are used for a vision test, they may make the eyes sting and cause a medicine taste in the mouth.  Follow-up care is a key part of your treatment and safety. Be sure to make and go to all appointments, and call your doctor if you are having problems. It's also a good idea to know your test results and keep a list of the medicines you take.  Where can you learn more?  Go to [employer] and enter G551 to learn more about "Learning About Vision Tests."  Current as of: November 14, 2021  Content Version: 14.1   2006-2024 [employer], Inc[employer].   Care instructions adapted under license by [facility]. If you have questions about a medical condition or this instruction, always ask your healthcare professional. Health[employer], Incorporated disclaims any warranty or liability for your use of this information.           Learning About Activities of Daily Living  What are activities of daily living?     Activities of daily living (ADLs) are the basic self-care tasks you do every day. These include eating, bathing, dressing, and moving around.  As you age, and if you have health problems,  you may find that it's harder to do some of these tasks. If so, your doctor can  suggest ideas that may help.  To measure what kind of help you may need, your doctor will ask how well you are able to do ADLs. Let your doctor know if there are any tasks that you are having trouble doing. This is an important first step to getting help. And when you have the help you need, you can stay as independent as possible.  How will a doctor assess your ADLs?  Asking about ADLs is part of a routine health checkup your doctor will likely do as you age. Your health check might be done in a doctor's office, in your home, or at a hospital. The goal is to find out if you are having any problems that could make it hard to care for yourself or that make it unsafe for you to be on your own.  To measure your ADLs, your doctor will ask how hard it is for you to do routine tasks. Your doctor may also want to know if you have changed the way you do a task because of a health problem. Your doctor may watch how you:  Walk back and forth.  Keep your balance while you stand or walk.  Move from sitting to standing or from a bed to a chair.  Button or unbutton a [employer] fast streamer.  Remove and put on your shoes.  It's common to feel a little worried or anxious if you find you can't do all the things you used to be able to do. Talking with your doctor about ADLs is a way to make sure you're as safe as possible and able to care for yourself as well as you can. You may want to bring a caregiver, friend, or family member to your checkup. They can help you talk to your doctor.  Follow-up care is a key part of your treatment and safety. Be sure to make and go to all appointments, and call your doctor if you are having problems. It's also a good idea to know your test results and keep a list of the medicines you take.  Current as of: April 04, 2022  Content Version: 14.1   2006-2024 Healthwise, Incorporated.   Care instructions adapted under license by [facility]. If you have questions about a medical condition or this  instruction, always ask your healthcare professional. Healthwise, Incorporated disclaims any warranty or liability for your use of this information.           Advance Directives: Care Instructions  Overview  An advance directive is a legal way to state your wishes at the end of your life. It tells your family and your doctor what to do if you can't say what you want.  There are two main types of advance directives. You can change them any time your wishes change.  Living will.  This form tells your family and your doctor your wishes about life support and other treatment. The form is also called a declaration.  Medical power of attorney.  This form lets you name a person to make treatment decisions for you when you can't speak for yourself. This person is called a health care agent (health care proxy, health care surrogate). The form is also called a durable power of attorney for health care.  If you do not have an advance directive, decisions about your medical care may be made by a family member,  or by a doctor or a judge who doesn't know you.  It may help to think of an advance directive as a gift to the people who care for you. If you have one, they won't have to make tough decisions by themselves.  For more information, including forms for your state, see the [employer] website ([employer]).  Follow-up care is a key part of your treatment and safety. Be sure to make and go to all appointments, and call your doctor if you are having problems. It's also a good idea to know your test results and keep a list of the medicines you take.  What should you include in an advance directive?  Many states have a unique advance directive form. (It may ask you to address specific issues.) Or you might use a universal form that's approved by many states.  If your form doesn't tell you what to address, it may be hard to know what to include in your advance directive. Use the questions below  to help you get started.  Who do you want to make decisions about your medical care if you are not able to?  What life-support measures do you want if you have a serious illness that gets worse over time or can't be cured?  What are you most afraid of that might happen? (Maybe you're afraid of having pain, losing your independence, or being kept alive by machines.)  Where would you prefer to die? (Your home? A hospital? A nursing home?)  Do you want to donate your organs when you die?  Do you want certain religious practices performed before you die?  When should you call for help?  Be sure to contact your doctor if you have any questions.  Where can you learn more?  Go to [employer] and enter R264 to learn more about "Advance Directives: Care Instructions."  Current as of: April 27, 2022  Content Version: 14.1   2006-2024 Healthwise, Incorporated.   Care instructions adapted under license by [facility]. If you have questions about a medical condition or this instruction, always ask your healthcare professional. Healthwise, Incorporated disclaims any warranty or liability for your use of this information.           A Healthy Heart: Care Instructions  Overview     Coronary artery disease, also called heart disease, occurs when a substance called plaque builds up in the vessels that supply oxygen-rich blood to your heart muscle. This can narrow the blood vessels and reduce blood flow. A heart attack happens when blood flow is completely blocked. A high-fat diet, smoking, and other factors increase the risk of heart disease.  Your doctor has found that you have a chance of having heart disease. A heart-healthy lifestyle can help keep your heart healthy and prevent heart disease. This lifestyle includes eating healthy, being active, staying at a weight that's healthy for you, and not smoking or using tobacco. It also includes taking medicines as directed, managing other health conditions,  and trying to get a healthy amount of sleep.  Follow-up care is a key part of your treatment and safety. Be sure to make and go to all appointments, and call your doctor if you are having problems. It's also a good idea to know your test results and keep a list of the medicines you take.  How can you care for yourself at home?  Diet   Use less salt when you cook and eat. This helps lower your blood pressure. Taste  food before salting. Add only a little salt when you think you need it. With time, your taste buds will adjust to less salt.    Eat fewer snack items, fast foods, canned soups, and other high-salt, high-fat, processed foods.    Read food labels and try to avoid saturated and trans fats. They increase your risk of heart disease by raising cholesterol levels.    Limit the amount of solid fat--butter, margarine, and shortening--you eat. Use olive, peanut, or canola oil when you cook. Bake, broil, and steam foods instead of frying them.    Eat a variety of fruit and vegetables every day. Dark green, deep orange, red, or yellow fruits and vegetables are especially good for you. Examples include spinach, carrots, peaches, and berries.    Foods high in fiber can reduce your cholesterol and provide important vitamins and minerals. High-fiber foods include whole-grain cereals and breads, oatmeal, beans, brown rice, citrus fruits, and apples.    Eat lean proteins. Heart-healthy proteins include seafood, lean meats and poultry, eggs, beans, peas, nuts, seeds, and soy products.    Limit drinks and foods with added sugar. These include candy, desserts, and soda pop.   Heart-healthy lifestyle   If your doctor recommends it, get more exercise. For many people, walking is a good choice. Or you may want to swim, bike, or do other activities. Bit by bit, increase the time you're active every day. Try for at least 30 minutes on most days of the week.    Try to quit or cut back on using tobacco and other nicotine  products. This includes smoking and vaping. If you need help quitting, talk to your doctor about stop-smoking programs and medicines. These can increase your chances of quitting for good. Quitting is one of the most important things you can do to protect your heart. It is never too late to quit. Try to avoid secondhand smoke too.    Stay at a weight that's healthy for you. Talk to your doctor if you need help losing weight.    Try to get 7 to 9 hours of sleep each night.    Limit alcohol to 2 drinks a day for men and 1 drink a day for women. Too much alcohol can cause health problems.    Manage other health problems such as diabetes, high blood pressure, and high cholesterol. If you think you may have a problem with alcohol or drug use, talk to your doctor.   Medicines   Take your medicines exactly as prescribed. Call your doctor if you think you are having a problem with your medicine.    If your doctor recommends aspirin, take the amount directed each day. Make sure you take aspirin and not another kind of pain reliever, such as acetaminophen (Tylenol).   When should you call for help?   Call 911 if you have symptoms of a heart attack. These may include:   Chest pain or pressure, or a strange feeling in the chest.    Sweating.    Shortness of breath.    Pain, pressure, or a strange feeling in the back, neck, jaw, or upper belly or in one or both shoulders or arms.    Lightheadedness or sudden weakness.    A fast or irregular heartbeat.   After you call 911, the operator may tell you to chew 1 adult-strength or 2 to 4 low-dose aspirin. Wait for an ambulance. Do not try to drive yourself.  Watch closely for  changes in your health, and be sure to contact your doctor if you have any problems.  Where can you learn more?  Go to [employer] and enter F075 to learn more about "A Healthy Heart: Care Instructions."  Current as of: December 03, 2021  Content Version: 14.1   2006-2024  Healthwise, Incorporated.   Care instructions adapted under license by [facility]. If you have questions about a medical condition or this instruction, always ask your healthcare professional. Healthwise, Incorporated disclaims any warranty or liability for your use of this information.      Personalized Preventive Plan for Michael Knight - 03/01/2023  Medicare offers a range of preventive health benefits. Some of the tests and screenings are paid in full while other may be subject to a deductible, co-insurance, and/or copay.    Some of these benefits include a comprehensive review of your medical history including lifestyle, illnesses that may run in your family, and various assessments and screenings as appropriate.    After reviewing your medical record and screening and assessments performed today your provider may have ordered immunizations, labs, imaging, and/or referrals for you.  A list of these orders (if applicable) as well as your Preventive Care list are included within your After Visit Summary for your review.    Other Preventive Recommendations:    A preventive eye exam performed by an eye specialist is recommended every 1-2 years to screen for glaucoma; cataracts, macular degeneration, and other eye disorders.  A preventive dental visit is recommended every 6 months.  Try to get at least 150 minutes of exercise per week or 10,000 steps per day on a pedometer .  Order or download the FREE "Exercise & Physical Activity: Your Everyday Guide" from [employer]. Call 5670790497 or search [employer] online.  You need 1200-1500 mg of calcium and 1000-2000 IU of vitamin D per day. It is possible to meet your calcium requirement with diet alone, but a vitamin D supplement is usually necessary to meet this goal.  When exposed to the sun, use a sunscreen that protects against both UVA and UVB radiation with an SPF of 30 or greater. Reapply every 2 to 3 hours or after  sweating, drying off with a towel, or swimming.  Always wear a seat belt when traveling in a car. Always wear a helmet when riding a bicycle or motorcycle.       Learning About Living Patrecia Pace  What is a living will?     A living will, also called a declaration, is a legal form. It tells your family and your doctor your wishes when you can't speak for yourself. It's used by the health professionals who will treat you as you near the end of your life or if you get seriously hurt or ill.  If you put your wishes in writing, your loved ones and others will know what kind of care you want. They won't need to guess. This can ease your mind and be helpful to others. And you can change or cancel your living will at any time.  A living will is not the same as an estate or property will. An estate will explains what you want to happen with your money and property after you die.  How do you use it?  Keep these facts in mind about how a living will is used.  Your living will is used only if you can't speak or make decisions for yourself. Most  often, one or more doctors must certify that you can't speak or decide for yourself before your living will takes effect.  If you get better and can speak for yourself again, you can accept or refuse any treatment. It doesn't matter what you said in your living will.  Some states may limit your right to refuse treatment in certain cases. For example, you may need to clearly state in your living will that you don't want artificial hydration and nutrition, such as being fed through a tube.  Is a living will a legal document?  A living will is a legal document. Each state has its own laws about living wills. And a living will may be called something else in your state.  Here are some things to know about living wills.  You don't need an attorney to complete a living will. But legal advice can be helpful if your state's laws are unclear. It can also help if your health history is complicated or  your family can't agree on what should be in your living will.  You can change your living will at any time. Some people find that their wishes about end-of-life care change as their health changes. If you make big changes to your living will, complete a new form.  If you move to another state, make sure that your living will is legal in the state where you now live. In most cases, doctors will respect your wishes even if you have a form from a different state.  You might use a universal form that has been approved by many states. This kind of form can sometimes be filled out and stored online. Your digital copy will then be available wherever you have a connection to the internet. The doctors and nurses who need to treat you can find it right away.  Your state may offer an online registry. This is another place where you can store your living will online.  It's a good idea to get your living will notarized. This means using a person called a notary public to watch two people sign, or witness, your living will.  What should you know when you create a living will?  Here are some questions to ask yourself as you make your living will.  Do you know enough about life support methods that might be used? If not, talk to your doctor so you know what might be done if you can't breathe on your own, your heart stops, or you can't swallow.  What things would you still want to be able to do after you receive life-support methods? Would you want to be able to walk? To speak? To eat on your own? To live without the help of machines?  Do you want certain religious practices performed if you become very ill?  If you have a choice, where do you want to be cared for? In your home? At a hospital or nursing home?  If you have a choice at the end of your life, where would you prefer to die? At home? In a hospital or nursing home? Somewhere else?  Would you prefer to be buried or cremated?  Do you want your organs to be donated after you  die?  What should you do with your living will?  Make sure that your family members and your health care agent have copies of your living will (also called a declaration).  Give your doctor a copy of your living will. Ask to have it  kept as part of your medical record. If you have more than one doctor, make sure that each one has a copy.  Put a copy of your living will where it can be easily found. For example, some people may put a copy on their refrigerator door. If you are using a digital copy, be sure your doctor, family members, and health care agent know how to find and access it.  Where can you learn more?  Go to [employer] and enter K356 to learn more about "Learning About Living Wills."  Current as of: April 27, 2022  Content Version: 14.1   2006-2024 [employer].   Care instructions adapted under license by [facility]. If you have questions about a medical condition or this instruction, always ask your healthcare professional. [employer] disclaims any warranty or liability for your use of this information.           Learning About Medical Power of Attorney  What is a medical power of attorney?     A medical power of attorney, also called a durable power of attorney for health care, is one type of the legal forms called advance directives. It lets you name the person you want to make treatment decisions for you if you can't speak or decide for yourself. The person you choose is called your health care agent. This person is also called a health care proxy or health care surrogate.  A medical power of attorney may be called something else in your state.  How do you choose a health care agent?  Choose your health care agent carefully. This person may or may not be a family member.  Talk to the person before you make your final decision. Make sure this person is comfortable with this responsibility.  It's a good idea to choose someone who:  Is at least  66 years old.  Knows you well and understands what makes life meaningful for you.  Understands your religious and moral values.  Will do what you want, not what that person wants.  Will be able to make difficult choices at a stressful time.  Will be able to refuse or stop treatment, if that is what you would want, even if you could die.  Will be firm and confident with health professionals if needed.  Will ask questions to get needed information.  Lives near you or agrees to travel to you if needed.  Your family may help you make medical decisions while you can still be part of that process. But it's important to choose one person to be your health care agent in case you aren't able to make decisions for yourself.  If you don't fill out the legal form and name a health care agent, the decisions your family can make may be limited.  A health care agent may be called something else in your state.  Who will make decisions for you if you don't have a health care agent?  If you don't have a health care agent or a living will, you may not get the care you want. Decisions may be made by family members who disagree about your medical care. Or decisions may be made by a medical professional who doesn't know you well. In some cases, a judge makes the decisions.  When you name a health care agent, it is very clear who has the power to make health decisions for you.  How do you name a health care agent?  You name  your health care agent on a legal form. This form is usually called a medical power of attorney. Ask your hospital, state bar association, or office on aging where to find these forms.  You must sign the form to make it legal. Some states require you to get the form notarized. This means that a person called a notary public watches you sign the form and then the notary signs the form. Some states also require that two or more witnesses sign the form.  Be sure to tell your family members and doctors who your health care  agent is.  Where can you learn more?  Go to [employer] and enter P737 to learn more about "Learning About Medical Power of Attorney."  Current as of: April 04, 2022  Content Version: 14.1   2006-2024 [employer], [employer].   Care instructions adapted under license by [facility]. If you have questions about a medical condition or this instruction, always ask your healthcare professional. [employer] Incorporated disclaims any warranty or liability for your use of this information.           Advance Care Planning: Care Instructions  Overview     It can be hard to live with an illness that cannot be cured. But if your health is getting worse, you may want to make decisions about end-of-life care. Planning for the end of your life does not mean that you are giving up. It is a way to make sure that your wishes are met. Clearly stating your wishes can make it easier for your loved ones. Making plans while you are still able may also ease your mind and make your final days less stressful and more meaningful.  Follow-up care is a key part of your treatment and safety. Be sure to make and go to all appointments, and call your doctor if you are having problems. It's also a good idea to know your test results and keep a list of the medicines you take.  What can you do to plan for the end of life?  You can bring these issues up with your doctor. You do not need to wait until your doctor starts the conversation. You might start with, "What makes life worth living for me is. . ." And then follow it with, "I would not be willing to live with . . ." When you complete this sentence it helps your doctor understand your wishes.  Talk openly and honestly with your doctor. This is the best way to understand the decisions you will need to make as your health changes. Know that you can always change your mind.  Ask your doctor about commonly used life-support measures. These include tube feedings,  breathing machines, and fluids given through a vein (I.V.). Understanding these treatments will help you decide whether you want them.  You may choose to have these life-supporting treatments for a limited time. This allows a trial period to see whether they will help you. You may also decide that you want your doctor to take only certain measures to keep you alive. It may help to think about the big picture, like what makes life worth living for you or what your values and goals are.  Talk to your doctor about how long you are likely to live. Your doctor may be able to give you an idea of what usually happens with your specific illness.  Think about preparing papers that state your wishes. These papers are called advance directives. If you do  this early and review them often, there will not be any confusion about what you want. You can change your instructions at any time.  Which papers should you prepare?  Advance directives are legal papers that tell doctors how you want to be cared for at the end of your life. You do not need a lawyer to write these papers. Ask your doctor or your state health department for information on how to write your advance directives. They may have the forms for each of these types of papers. Make sure your doctor has a copy of these on file, and give a copy to a family member or close friend.  Consider a do-not-resuscitate order (DNR). This order asks that no extra treatments be done if your heart stops or you stop breathing. Extra treatments may include cardiopulmonary resuscitation (CPR), electrical shock to restart your heart, or a machine to breathe for you. If you decide to have a DNR order, ask your doctor to explain and write it. Place the order in your home where everyone can easily see it.  Consider a living will. A living will explains your wishes about life support and other treatments at the end of your life if you become unable to speak for yourself. Living wills tell  doctors to use or not use treatments that would keep you alive. You must have one or two witnesses or a notary present when you sign this form. A living will may be called something else in your state.  Consider a medical power of attorney. This form allows you to name a person to make decisions about your care if you are not able to. Most people ask a close friend or family member. Talk to this person about the kinds of treatments you want and those that you do not want. Make sure this person understands your wishes. A medical power of attorney may be called something else in your state.  These legal papers are simple to change. Tell your doctor what you want to change, and ask him or her to make a note in your medical file. Give your family updated copies of the papers.  Where can you learn more?  Go to [employer] and enter P184 to learn more about "Advance Care Planning: Care Instructions."  Current as of: April 04, 2022  Content Version: 14.1   2006-2024 [employer], [employer].   Care instructions adapted under license by [facility]. If you have questions about a medical condition or this instruction, always ask your healthcare professional. [employer]orporated disclaims any warranty or liability for your use of this information.      Alcohol Abuse and Alcoholism   (Alcohol Dependence; Alcohol Use Disorder)       Definition   Alcohol abuse is excessive or problematic alcohol consumption. It can progress to alcoholism.   Alcoholism is chronic alcohol abuse that results in a physical dependence on alcohol (withdrawal symptoms) and an inability to stop or limit drinking.   Causes   Several factors can contribute to alcohol abuse and alcoholism, including:   Genes   Brain chemicals that may be different than normal   Social pressure   Emotional stress   Pain   Depression and other mental health problems   Problem drinking behaviors learned from family or friends   Risk Factors    These factors increase your chance of developing alcoholism. Tell your doctor if you have any of these risk factors:   Sex: male   Family members  who abuse alcohol (especially men whose fathers or brothers are alcoholic)   Starting to use alcohol at an early age (younger than 31)   Using illicit drugs or non-medical use of prescription drugs   Peer pressure   Easy access to alcoholic beverages   Psychiatric disorders, such as depression or anxiety   Smoking   Symptoms   It is common to deny an alcohol problem. Alcohol abuse can occur without physical dependence.   Alcohol abuse symptoms include:   Repeated work, school, or home problems due to drinking   Risking physical safety   Recurring trouble with the law, often including drinking and driving   Continuing to drink despite alcohol-related difficulties   Symptoms of alcoholism include:   Craving a drink   Unable to stop or limit drinking   Needing greater amounts of alcohol to feel the same effect   Giving up activities in order to drink or recover from alcohol   Drinking that continues even when it causes or worsens health problems   Wanting to stop or reduce drinking, but not being able   Withdrawal symptoms if alcohol is stopped include:   Nausea   Sweating   Shaking   Anxiety   Increased blood pressure   Seizures ( delirium tremens [DTs])   The brain, nervous system, heart, liver, stomach, gastrointestinal tract, and pancreas can all be damaged by alcoholism.     Some of the Organs Damaged in Alcohol Abuse        2011 [employer]   Diagnosis   Doctors ask a series of questions to assess possible alcohol-related problems, including:   Have you tried to reduce your drinking?   Have you felt bad about drinking?   Have you been annoyed by another person's criticism of your drinking?   Do you drink in the morning to steady your nerves or cure a hangover?   Do you have problems with a job, your family, or the law?   Do you drive under the  influence of alcohol?   Blood tests may be done to:   Look at the size of your red blood cells and to check for a substance called carbohydrate-deficient transferrin   Check for alcohol-related liver disease and other health problems   Treatment   Treatment for alcohol abuse or dependence is aimed at teaching patients how to manage the disease. Most professionals believe that this means giving up alcohol completely and permanently.   The first and most important step is recognizing a problem exists. Successful treatment depends on your desire to change. Your doctor can help you withdraw from alcohol safely. This could require hospitalization in a detoxification center. They will carefully monitor you for side effects. You may need medication while you are undergoing detoxification.   Treatments include:   Medications    Drugs can help relieve some of the symptoms of withdrawal and help prevent relapse. The doctor may prescribe medication to reduce cravings for alcohol.   Medications used to treat alcoholism and to try to prevent drinking include:   Naltrexone (ReVia, Vivitrol)blocks the high that makes you crave alcohol   Disulfiram (Antabuse)makes you very sick if you drink alcohol   Acamprosate (Campral)reduces your craving for alcohol   A study showed that an anticonvulsant drug, topiramate (Topamax), may reduce alcohol dependence.   Education and Counseling    Therapy helps you to recognize alcohol's dangers. Education raises awareness of underlying issues and lifestyles that promote drinking.  In therapy, you work to improve coping skills and learn other ways of dealing with stress or pain.   Mentoring and Community Help    Alcoholics Anonymous (AA) helps many people to stop drinking and stay sober. Members meet regularly and support each other. Your family members may also benefit from attending meetings of Al-Anon. Living with an alcoholic can be a painful, stressful situation.   Here are some general  statistics on treatment outcomes of individuals one year after attempting to stop drinking:   1/3 remained abstinent   1/3 resumed drinking but at a lower level   1/3 relapsed completely   If you are diagnosed with alcohol abuse or alcoholism, follow your doctor's instructions .   Prevention   Realizing that alcohol causes problems helps some people avoid it. Suggestions to decrease the risk of alcohol abuse and dependence include:   Socialize without alcohol.   Avoid going to bars.   Do not keep alcohol in your home.   Avoid situations and people that encourage drinking.   Make new nondrinking friends.   Do fun things that do not involve alcohol.   Avoid reaching for a drink when stressed or upset.   Limit your alcohol intake to a moderate level.   Moderate is two or fewer drinks per day for men and one or fewer for women and older adults   A 12-ounce bottle of beer, a five-ounce glass of wine, or 1.5 ounces of liquor is considered one drink   If you are a parent, having a good relationship with your children may reduce their risk of alcohol abuse.   Most professionals who treat alcohol abuse and dependence believe that complete abstinence is the only effective prevention.     Last Reviewed: September 2010 Lowell Guitar, MD, PhD, MPH   Updated: 03/01/2009       DASH Diet: After Your Visit  Your Care Instructions  The DASH diet is an eating plan that can help lower your blood pressure. DASH stands for Dietary Approaches to Stop Hypertension. Hypertension is high blood pressure.  The DASH diet focuses on eating foods that are high in calcium, potassium, and magnesium. These nutrients can lower blood pressure. The foods that are highest in these nutrients are fruits, vegetables, low-fat dairy products, nuts, seeds, and legumes. But taking calcium, potassium, and magnesium supplements instead of eating foods that are high in those nutrients does not have the same effect. The DASH diet also includes whole grains, fish,  and poultry.  The DASH diet is one of several lifestyle changes your doctor may recommend to lower your high blood pressure. Your doctor may also want you to decrease the amount of sodium in your diet. Lowering sodium while following the DASH diet can lower blood pressure even further than just the DASH diet alone.  Follow-up care is a key part of your treatment and safety. Be sure to make and go to all appointments, and call your doctor if you are having problems. It's also a good idea to know your test results and keep a list of the medicines you take.  How can you care for yourself at home?  Following the DASH diet  Eat 4 to 5 servings of fruit each day. A serving is 1 medium-sized piece of fruit,  cup chopped or canned fruit, 1/4 cup dried fruit, or 4 ounces ( cup) of fruit juice. Choose fruit more often than fruit juice.  Eat 4 to 5 servings of vegetables each  day. A serving is 1 cup of lettuce or raw leafy vegetables,  cup of chopped or cooked vegetables, or 4 ounces ( cup) of vegetable juice. Choose vegetables more often than vegetable juice.  Get 2 to 3 servings of low-fat and fat-free dairy each day. A serving is 8 ounces of milk, 1 cup of yogurt, or 1  ounces of cheese.  Eat 7 to 8 servings of grains each day. A serving is 1 slice of bread, 1 ounce of dry cereal, or  cup of cooked rice, pasta, or cooked cereal. Try to choose whole-grain products as much as possible.  Limit lean meat, poultry, and fish to 6 ounces each day. Six ounces is about the size of two decks of cards.  Eat 4 to 5 servings of nuts, seeds, and legumes (cooked dried beans, lentils, and split peas) each week. A serving is 1/3 cup of nuts, 2 tablespoons of seeds, or  cup cooked dried beans or peas.  Limit sweets and added sugars to 5 servings or less a week. A serving is 1 tablespoon jelly or jam,  cup sorbet, or 1 cup of lemonade.  Tips for success  Start small. Do not try to make dramatic changes to your diet all at once. You  might feel that you are missing out on your favorite foods and then be more likely to not follow the plan. Make small changes, and stick with them. Once those changes become habit, add a few more changes.  Try some of the following:  Make it a goal to eat a fruit or vegetable at every meal and at snacks. This will make it easy to get the recommended amount of fruits and vegetables each day.  Try yogurt topped with fruit and nuts for a snack or healthy dessert.  Add lettuce, tomato, cucumber, and onion to sandwiches.  Combine a ready-made pizza crust with low-fat mozzarella cheese and lots of vegetable toppings. Try using tomatoes, squash, spinach, broccoli, carrots, cauliflower, and onions.  Have a variety of cut-up vegetables with a low-fat dip as an appetizer instead of chips and dip.  Sprinkle sunflower seeds or chopped almonds over salads. Or try adding chopped walnuts or almonds to cooked vegetables.  Try some vegetarian meals using beans and peas. Add garbanzo or kidney beans to salads. Make burritos and tacos with mashed pinto beans or black beans     2006-2012 [employer], [employer]. Care instructions adapted under license by [facility]. This care instruction is for use with your licensed healthcare professional. If you have questions about a medical condition or this instruction, always ask your healthcare professional. [employer], [employer] disclaims any warranty or liability for your use of this information.  Content Version: 9.4.94723; Last Revised: August 25, 2010                Heart-Healthy Diet   Sodium, Fat, and Cholesterol Controlled Diet       What Is a Heart Healthy Diet?   A heart-healthy diet is one that limits sodium , certain types of fat , and cholesterol . This type of diet is recommended for:   People with any form of cardiovascular disease (eg, coronary heart disease , peripheral vascular disease , previous heart attack , previous stroke )   People with risk factors for  cardiovascular disease, such as high blood pressure , high cholesterol , or diabetes   Anyone who wants to lower their risk of developing cardiovascular disease   Sodium  Sodium is a mineral found in many foods. In general, most people consume much more sodium than they need. Diets high in sodium can increase blood pressure and lead to edema (water retention). On a heart-healthy diet, you should consume no more than 2,300 mg (milligrams) of sodium per dayabout the amount in one teaspoon of table salt. The foods highest in sodium include table salt (about 50% sodium), processed foods, convenience foods, and preserved foods.   Cholesterol    Cholesterol is a fat-like, waxy substance in your blood. Our bodies make some cholesterol. It is also found in animal products, with the highest amounts in fatty meat, egg yolks, whole milk, cheese, shellfish, and organ meats. On a heart-healthy diet, you should limit your cholesterol intake to less than 200 mg per day.   It is normal and important to have some cholesterol in your bloodstream. But too much cholesterol can cause plaque to build up within your arteries, which can eventually lead to a heart attack or stroke.   The two types of cholesterol that are most commonly referred to are:   Low-density lipoprotein (LDL) cholesterol  Also known as bad cholesterol, this is the cholesterol that tends to build up along your arteries. Bad cholesterol levels are increased by eating fats that are saturated or hydrogenated. Optimal level of this cholesterol is less than 100. Over 130 starts to get risky for heart disease.   High-density lipoprotein (HDL) cholesterol  Also known as good cholesterol, this type of cholesterol actually carries cholesterol away from your arteries and may, therefore, help lower your risk of having a heart attack. You want this level to be high (ideally greater than 60). It is a risk to have a level less than 40. You can raise this good cholesterol by  eating olive oil, canola oil, avocados, or nuts. Exercise raises this level, too.   Fat    Fat is calorie dense and packs a lot of calories into a small amount of food. Even though fats should be limited due to their high calorie content, not all fats are bad. In fact, some fats are quite healthful. Fat can be broken down into four main types.   The good-for-you fats are:   Monounsaturated fat  found in oils such as olive and canola, avocados, and nuts and natural nut butters; can decrease cholesterol levels, while keeping levels of HDL cholesterol high   Polyunsaturated fat  found in oils such as safflower, sunflower, soybean, corn, and sesame; can decrease total cholesterol and LDL cholesterol   Omega-3 fatty acids  particularly those found in fatty fish (such as salmon, trout, tuna, mackerel, herring, and sardines); can decrease risk of arrhythmias, decrease triglyceride levels, and slightly lower blood pressure   The fats that you want to limit are:   Saturated fat  found in animal products, many fast foods, and a few vegetables; increases total blood cholesterol, including LDL levels   Animal fats that are saturated include: butter, lard, whole-milk dairy products, meat fat, and poultry skin   Vegetable fats that are saturated include: hydrogenated shortening, palm oil, coconut oil, cocoa butter   Hydrogenated or trans fat  found in margarine and vegetable shortening, most shelf stable snack foods, and fried foods; increases LDL and decreases HDL     It is generally recommended that you limit your total fat for the day to less than 30% of your total calories. If you follow an 1800-calorie heart healthy diet, for example, this would mean  60 grams of fat or less per day.   Saturated fat and trans fat in your diet raises your blood cholesterol the most, much more than dietary cholesterol does. For this reason, on a heart-healthy diet, less than 7% of your calories should come from saturated fat and ideally 0% from  trans fat. On an 1800-calorie diet, this translates into less than 14 grams of saturated fat per day, leaving 46 grams of fat to come from mono- and polyunsaturated fats.   Food Choices on a Heart Healthy Diet   Food Category   Foods Recommended   Foods to Avoid   Grains   Breads and rolls without salted tops Most dry and cooked cereals Unsalted crackers and breadsticks Low-sodium or homemade breadcrumbs or stuffing All rice and pastas   Breads, rolls, and crackers with salted tops High-fat baked goods (eg, muffins, donuts, pastries) Quick breads, self-rising flour, and biscuit mixes Regular bread crumbs Instant hot cereals Commercially prepared rice, pasta, or stuffing mixes   Vegetables   Most fresh, frozen, and low-sodium canned vegetables Low-sodium and salt-free vegetable juices Canned vegetables if unsalted or rinsed   Regular canned vegetables and juices, including sauerkraut and pickled vegetables Frozen vegetables with sauces Commercially prepared potato and vegetable mixes   Fruits   Most fresh, frozen, and canned fruits All fruit juices   Fruits processed with salt or sodium   Milk   Nonfat or low-fat (1%) milk Nonfat or low-fat yogurt Cottage cheese, low-fat ricotta, cheeses labeled as low-fat and low-sodium   Whole milk Reduced-fat (2%) milk Malted and chocolate milk Full fat yogurt Most cheeses (unless low-fat and low salt) Buttermilk (no more than 1 cup per week)   Meats and Beans   Lean cuts of fresh or frozen beef, veal, lamb, or pork (look for the word loin) Fresh or frozen poultry without the skin Fresh or frozen fish and some shellfish Egg whites and egg substitutes (Limit whole eggs to three per week) Tofu Nuts or seeds (unsalted, dry-roasted), low-sodium peanut butter Dried peas, beans, and lentils   Any smoked, cured, salted, or canned meat, fish, or poultry (including bacon, chipped beef, cold cuts, hot dogs, sausages, sardines, and anchovies) Poultry skins Breaded and/or fried fish or meats  Canned peas, beans, and lentils Salted nuts   Fats and Oils   Olive oil and canola oil Low-sodium, low-fat salad dressings and mayonnaise   Butter, margarine, coconut and palm oils, bacon fat   Snacks, Sweets, and Condiments   Low-sodium or unsalted versions of broths, soups, soy sauce, and condiments Pepper, herbs, and spices; vinegar, lemon, or lime juice Low-fat frozen desserts (yogurt, sherbet, fruit bars) Sugar, cocoa powder, honey, syrup, jam, and preserves Low-fat, trans-fat free cookies, cakes, and pies Graham and animal crackers, fig bars, ginger snaps   High-fat desserts Broth, soups, gravies, and sauces, made from instant mixes or other high-sodium ingredients Salted snack foods Canned olives Meat tenderizers, seasoning salt, and most flavored vinegars   Beverages   Low-sodium carbonated beverages Tea and coffee in moderation Soy milk   Commercially softened water   Suggestions   Make whole grains, fruits, and vegetables the base of your diet.    Choose heart-healthy fats such as canola, olive, and flaxseed oil, and foods high in heart-healthy fats, such as nuts, seeds, soybeans, tofu, and fish.    Eat fish at least twice per week; the fish highest in omega-3 fatty acids and lowest in mercury include salmon, herring, mackerel, sardines, and  canned chunk light tuna. If you eat fish less than twice per week or have high triglycerides, talk to your doctor about taking fish oil supplements.    Read food labels.   For products low in fat and cholesterol, look for fat free, low-fat, cholesterol free, saturated fat free, and trans fat freeAlso scan the Nutrition Facts Label, which lists saturated fat, trans fat, and cholesterol amounts.   For products low in sodium, look for sodium free, very low sodium, low sodium, no added salt, and unsalted   Skip the salt when cooking or at the table; if food needs more flavor, get creative and try out different herbs and spices. Garlic and onion also add substantial flavor  to foods.    Trim any visible fat off meat and poultry before cooking, and drain the fat off after browning.    Use cooking methods that require little or no added fat, such as grilling, boiling, baking, poaching, broiling, roasting, steaming, stir-frying, and sauting.    Avoid fast food and convenience food. They tend to be high in saturated and trans fat and have a lot of added salt.    Talk to a registered dietitian for individualized diet advice.      Last Reviewed: March 2011 Jackqulyn Livings, MS, MPH, RD   Updated: 09/07/2009     High-Fiber Diet     What Is Fiber?   Dietary fiber is a form of carbohydrate found in plants that cannot be digested by humans. All plants contain fiber, including fruits, vegetables, grains, and legumes. Fiber is often classified into two categories: soluble and insoluble.   Soluble fiber draws water into the bowel and can help slow digestion. Examples of foods that are high in soluble fiber include oatmeal, oat bran, barley, legumes (eg, beans and peas), apples, and strawberries.   Insoluble fiber speeds digestion and can add bulk to the stool. Examples of foods that are high in insoluble fiber include whole-wheat products, wheat bran, cauliflower, green beans, and potatoes.   Why Follow a High-Fiber Diet?   A high-fiber diet is often recommended to prevent and treat constipation , hemorrhoids , diverticulitis , and irritable bowel syndrome . Eating a high-fiber diet can also help improve your cholesterol levels, lower your risk of coronary heart disease , reduce your risk of type 2 diabetes , and lower your weight. For people with type 1 or 2 diabetes, a high-fiber diet can also help stabilize blood sugar levels.   How Much Fiber Should I Eat?   A high-fiber diet should contain  20-35 grams  of fiber a day. This is actually the amount recommended for the general adult population; however, most Americans eat only 15 grams of fiber per day.   Digestion of Fiber   Eating a higher fiber diet  than usual can take some getting used to by your body's digestive system. To avoid the side effects of sudden increases in dietary fiber (eg, gas, cramping, bloating, and diarrhea), increase fiber gradually and be sure to drink plenty of fluids every day.   Tips for Increasing Fiber Intake   Whenever possible, choose whole grains over refined grains (eg, brown rice instead of white rice, whole-wheat bread instead of white bread).    Include a variety of grains in your diet, such as wheat, rye, barley, oats, quinoa, and bulgur.    Eat more vegetarian-based meals. Here are some ideas: black bean burgers, eggplant lasagna, and veggie tofu stir-fry.    Choose high-fiber  snacks, such as fruits, popcorn, whole-grain crackers, and nuts.    Make whole-grain cereal or whole-grain toast part of your daily breakfast regime.    When eating out, whether ordering a sandwich or dinner, ask for extra vegetables.    When baking, replace part of the white flour with whole-wheat flour. Whole-wheat flour is particularly easy to incorporate into a recipe.    High-Fiber Diet Eating Guide   Food Category   Foods Recommended   Notes   Grains   Whole-grain breads, muffins, bagels, or pita bread Rye bread Whole-wheat crackers or crisp breads Whole-grain or bran cereals Oatmeal, oat bran, or grits Wheat germ Whole-wheat pasta and brown rice   Read the ingredients list on food labels. Look for products that list "whole" as the first ingredient (eg, whole-wheat, whole oats). Choose cereals with at least 2 grams of fiber per serving.   Vegetables   All vegetables, especially asparagus, bean sprouts, broccoli, Brussels sprouts, cabbage, carrots, cauliflower, celery, corn, greens, green beans, green pepper, onions, peas, potatoes (with skin), snow peas, spinach, squash, sweet potatoes, tomatoes, zucchini   For maximum fiber intake, eat the peels of fruits and vegetablesjust be sure to wash them well first.   Fruits   All fruits, especially apples,  berries, grapefruits, mangoes, nectarines, oranges, peaches, pears, dried fruits (figs, dates, prunes, raisins)   Choose raw fruits and vegetables over juice, cooked, or cannedraw fruit has more fiber. Dried fruit is also a good source of fiber.   Milk   With the exception of yogurt containing inulin (a type of fiber), dairy foods provide little fiber.   Add more fiber by topping your yogurt or cottage cheese with fresh fruit, whole grain or bran cereals, nuts, or seeds.   Meats and Beans   All beans and peas, especially Garbanzo beans, kidney beans, lentils, lima beans, split peas, and pinto beans All nuts and seeds, especially almonds, peanuts, [place] nuts, cashews, peanut butter, walnuts, sesame and sunflower seeds All meat, poultry, fish, and eggs   Increase fiber in meat dishes by adding pinto beans, kidney beans, black-eyed peas, bran, or oatmeal. If you are following a low-fat diet, use nuts and seeds only in moderation.   Fats and Oils   All in moderation   Fats and oils do not provide fiber   Snacks, Sweets, and Condiments   Fruit Nuts Popcorn, whole-wheat pretzels, or trail mix made with dried fruits, nuts, and seeds Cakes, breads, and cookies made with oatmeal or whole-wheat flour   Most snack foods do not provide much fiber. Choose snacks with at least 2 grams of fiber per serving.     Last Reviewed: March 2011 Jackqulyn Livings, MS, MPH, RD   Updated: 09/07/2009     Keep Your Memory Lambert Mody       Many factors can affect your ability to remembera hectic lifestyle, aging, stress, chronic disease, and certain medicines. But, there are steps you can take to sharpen your mind and help preserve your memory.   Challenge Your Brain   Regularly challenging your mind may help keeps it in top shape. Good mental exercises include:   Crossword puzzlesUse a dictionary if you need it; you will learn more that way.   Brainteasers Try some!   Crafts, such as wood working and Microbiologist, such as gardening and Psychiatrist old friends or join groups to meet new ones.   Reading   Learning a new language  Taking a class, whether it be art history or tai chi   TravelingExperience the food, history, and culture of your destination   Learning to use a computer   Going to museums, the theater, or thought-provoking movies   Changing things in your daily life, such as reversing your pattern in the grocery store or brushing your teeth using your nondominant hand   Use Memory Aids   There is no need to remember every detail on your own. These memory aids can help:   Calendars and day planners   Electronic organizers to store all sorts of helpful informationThese devices can "beep" to remind you of appointments.   A book of days to record birthdays, anniversaries, and other occasions that occur on the same date every year   Detailed "to-do" lists and strategically placed sticky notes   Quick "study" sessionsBefore a gathering, review who will be there so their names will be fresh in your mind.   Establish routinesFor example, keep your keys, wallet, and umbrella in the same place all the time or take medicine with your 8:00 AM glass of juice   Live a Healthy Life   Many actions that will keep your body strong will do the same for your mind. For example:   Talk to Your Doctor About Herbs and Supplements    Malnutrition and vitamin deficiencies can impair your mental function. For example, vitamin B12 deficiency can cause a range of symptoms, including confusion. But, what if your nutritional needs are being met? Can herbs and supplements still offer a benefit? Researchers have investigated a range of natural remedies, such as ginkgo , ginseng , and the supplement phosphatidylserine (PS). So far, though, the evidence is inconsistent as to whether these products can improve memory or thinking.   If you are interested in taking herbs and supplements, talk to your doctor first because they may interact with other  medicines that you are taking.   Exercise Regularly    Among the many benefits of regular exercise are increased blood flow to the brain and decreased risk of certain diseases that can interfere with memory function. One study found that even moderate exercise has a beneficial effect. Examples of "moderate" exercise include:   Playing 18 holes of golf once a week, without a cart   Playing tennis twice a week   Walking one mile per day   Manage Stress    It can be tough to remember what is important when your mind is cluttered. Make time for relaxation. Choose activities that calm you down, and make it routine.   Manage Chronic Conditions    Side effects of high blood pressure , diabetes, and heart disease can interfere with mental function. Many of the lifestyle steps discussed here can help manage these conditions. Strive to eat a healthy diet, exercise regularly, get stress under control, and follow your doctor's advice for your condition.   Minimize Medications    Talk to your doctor about the medicines that you take. Some may be unnecessary. Also, healthy lifestyle habits may lower the need for certain drugs.     Last Reviewed: April 2010 Lamonte Richer, MD   Updated: 09/22/2008     Heart-Healthy Diet   Sodium, Fat, and Cholesterol Controlled Diet       What Is a Heart Healthy Diet?   A heart-healthy diet is one that limits sodium , certain types of fat , and cholesterol . This type of diet is recommended for:  People with any form of cardiovascular disease (eg, coronary heart disease , peripheral vascular disease , previous heart attack , previous stroke )   People with risk factors for cardiovascular disease, such as high blood pressure , high cholesterol , or diabetes   Anyone who wants to lower their risk of developing cardiovascular disease   Sodium    Sodium is a mineral found in many foods. In general, most people consume much more sodium than they need. Diets high in sodium can increase blood pressure and  lead to edema (water retention). On a heart-healthy diet, you should consume no more than 2,300 mg (milligrams) of sodium per dayabout the amount in one teaspoon of table salt. The foods highest in sodium include table salt (about 50% sodium), processed foods, convenience foods, and preserved foods.   Cholesterol    Cholesterol is a fat-like, waxy substance in your blood. Our bodies make some cholesterol. It is also found in animal products, with the highest amounts in fatty meat, egg yolks, whole milk, cheese, shellfish, and organ meats. On a heart-healthy diet, you should limit your cholesterol intake to less than 200 mg per day.   It is normal and important to have some cholesterol in your bloodstream. But too much cholesterol can cause plaque to build up within your arteries, which can eventually lead to a heart attack or stroke.   The two types of cholesterol that are most commonly referred to are:   Low-density lipoprotein (LDL) cholesterol  Also known as bad cholesterol, this is the cholesterol that tends to build up along your arteries. Bad cholesterol levels are increased by eating fats that are saturated or hydrogenated. Optimal level of this cholesterol is less than 100. Over 130 starts to get risky for heart disease.   High-density lipoprotein (HDL) cholesterol  Also known as good cholesterol, this type of cholesterol actually carries cholesterol away from your arteries and may, therefore, help lower your risk of having a heart attack. You want this level to be high (ideally greater than 60). It is a risk to have a level less than 40. You can raise this good cholesterol by eating olive oil, canola oil, avocados, or nuts. Exercise raises this level, too.   Fat    Fat is calorie dense and packs a lot of calories into a small amount of food. Even though fats should be limited due to their high calorie content, not all fats are bad. In fact, some fats are quite healthful. Fat can be broken down into four  main types.   The good-for-you fats are:   Monounsaturated fat  found in oils such as olive and canola, avocados, and nuts and natural nut butters; can decrease cholesterol levels, while keeping levels of HDL cholesterol high   Polyunsaturated fat  found in oils such as safflower, sunflower, soybean, corn, and sesame; can decrease total cholesterol and LDL cholesterol   Omega-3 fatty acids  particularly those found in fatty fish (such as salmon, trout, tuna, mackerel, herring, and sardines); can decrease risk of arrhythmias, decrease triglyceride levels, and slightly lower blood pressure   The fats that you want to limit are:   Saturated fat  found in animal products, many fast foods, and a few vegetables; increases total blood cholesterol, including LDL levels   Animal fats that are saturated include: butter, lard, whole-milk dairy products, meat fat, and poultry skin   Vegetable fats that are saturated include: hydrogenated shortening, palm oil, coconut oil, cocoa butter  Hydrogenated or trans fat  found in margarine and vegetable shortening, most shelf stable snack foods, and fried foods; increases LDL and decreases HDL     It is generally recommended that you limit your total fat for the day to less than 30% of your total calories. If you follow an 1800-calorie heart healthy diet, for example, this would mean 60 grams of fat or less per day.   Saturated fat and trans fat in your diet raises your blood cholesterol the most, much more than dietary cholesterol does. For this reason, on a heart-healthy diet, less than 7% of your calories should come from saturated fat and ideally 0% from trans fat. On an 1800-calorie diet, this translates into less than 14 grams of saturated fat per day, leaving 46 grams of fat to come from mono- and polyunsaturated fats.   Food Choices on a Heart Healthy Diet   Food Category   Foods Recommended   Foods to Avoid   Grains   Breads and rolls without salted tops Most dry and cooked  cereals Unsalted crackers and breadsticks Low-sodium or homemade breadcrumbs or stuffing All rice and pastas   Breads, rolls, and crackers with salted tops High-fat baked goods (eg, muffins, donuts, pastries) Quick breads, self-rising flour, and biscuit mixes Regular bread crumbs Instant hot cereals Commercially prepared rice, pasta, or stuffing mixes   Vegetables   Most fresh, frozen, and low-sodium canned vegetables Low-sodium and salt-free vegetable juices Canned vegetables if unsalted or rinsed   Regular canned vegetables and juices, including sauerkraut and pickled vegetables Frozen vegetables with sauces Commercially prepared potato and vegetable mixes   Fruits   Most fresh, frozen, and canned fruits All fruit juices   Fruits processed with salt or sodium   Milk   Nonfat or low-fat (1%) milk Nonfat or low-fat yogurt Cottage cheese, low-fat ricotta, cheeses labeled as low-fat and low-sodium   Whole milk Reduced-fat (2%) milk Malted and chocolate milk Full fat yogurt Most cheeses (unless low-fat and low salt) Buttermilk (no more than 1 cup per week)   Meats and Beans   Lean cuts of fresh or frozen beef, veal, lamb, or pork (look for the word loin) Fresh or frozen poultry without the skin Fresh or frozen fish and some shellfish Egg whites and egg substitutes (Limit whole eggs to three per week) Tofu Nuts or seeds (unsalted, dry-roasted), low-sodium peanut butter Dried peas, beans, and lentils   Any smoked, cured, salted, or canned meat, fish, or poultry (including bacon, chipped beef, cold cuts, hot dogs, sausages, sardines, and anchovies) Poultry skins Breaded and/or fried fish or meats Canned peas, beans, and lentils Salted nuts   Fats and Oils   Olive oil and canola oil Low-sodium, low-fat salad dressings and mayonnaise   Butter, margarine, coconut and palm oils, bacon fat   Snacks, Sweets, and Condiments   Low-sodium or unsalted versions of broths, soups, soy sauce, and condiments Pepper, herbs, and spices;  vinegar, lemon, or lime juice Low-fat frozen desserts (yogurt, sherbet, fruit bars) Sugar, cocoa powder, honey, syrup, jam, and preserves Low-fat, trans-fat free cookies, cakes, and pies Graham and animal crackers, fig bars, ginger snaps   High-fat desserts Broth, soups, gravies, and sauces, made from instant mixes or other high-sodium ingredients Salted snack foods Canned olives Meat tenderizers, seasoning salt, and most flavored vinegars   Beverages   Low-sodium carbonated beverages Tea and coffee in moderation Soy milk   Commercially softened water   Suggestions   Make whole grains,  fruits, and vegetables the base of your diet.    Choose heart-healthy fats such as canola, olive, and flaxseed oil, and foods high in heart-healthy fats, such as nuts, seeds, soybeans, tofu, and fish.    Eat fish at least twice per week; the fish highest in omega-3 fatty acids and lowest in mercury include salmon, herring, mackerel, sardines, and canned chunk light tuna. If you eat fish less than twice per week or have high triglycerides, talk to your doctor about taking fish oil supplements.    Read food labels.   For products low in fat and cholesterol, look for fat free, low-fat, cholesterol free, saturated fat free, and trans fat freeAlso scan the Nutrition Facts Label, which lists saturated fat, trans fat, and cholesterol amounts.   For products low in sodium, look for sodium free, very low sodium, low sodium, no added salt, and unsalted   Skip the salt when cooking or at the table; if food needs more flavor, get creative and try out different herbs and spices. Garlic and onion also add substantial flavor to foods.    Trim any visible fat off meat and poultry before cooking, and drain the fat off after browning.    Use cooking methods that require little or no added fat, such as grilling, boiling, baking, poaching, broiling, roasting, steaming, stir-frying, and sauting.    Avoid fast food and convenience food. They tend to be  high in saturated and trans fat and have a lot of added salt.    Talk to a registered dietitian for individualized diet advice.      Last Reviewed: March 2011 Jackqulyn Livings, MS, MPH, RD   Updated: 09/07/2009       Preventing Osteoporosis: After Your Visit  Your Care Instructions  Osteoporosis means the bones are weak and thin enough that they can break easily. The older you are, the more likely you are to get osteoporosis. But with plenty of calcium, vitamin D, and exercise, you can help prevent osteoporosis.  The preteen and teen years are a key time for bone building. With the help of calcium, vitamin D, and exercise in those early years and beyond, the bones reach their peak density and strength by age 32. After age 61, your bones naturally start to thin and weaken.  The stronger your bones are at around age 67, the lower your risk for osteoporosis. But no matter what your age and risk are, your bones still need calcium, vitamin D, and exercise to stay strong. Also avoid smoking, and limit alcohol. Smoking and heavy alcohol use can make your bones thinner.  Talk to your doctor about any special risks you might have, such as having a close relative with osteoporosis or taking a medicine that can weaken bones. Your doctor can tell you the best ways to protect your bones from thinning.  Follow-up care is a key part of your treatment and safety. Be sure to make and go to all appointments, and call your doctor if you are having problems. It's also a good idea to know your test results and keep a list of the medicines you take.  How can you care for yourself at home?  Get enough calcium and vitamin D. The [employer] recommends adults younger than age 83 need 1,000 mg of calcium and 600 IU of vitamin D each day. Women ages 4 to 38 need 1,200 mg of calcium and 600 IU of vitamin D each day. Men ages 34 to 73  need 1,000 mg of calcium and 600 IU of vitamin D each day. Adults 71 and older need 1,200 mg of calcium  and 800 IU of vitamin D each day.  Eat foods rich in calcium, like yogurt, cheese, milk, and dark green vegetables.  Eat foods rich in vitamin D, like eggs, fatty fish, cereal, and fortified milk.  Get some sunshine. Your body uses sunshine to make its own vitamin D. The safest time to be out in the sun is before 10 a.m. or after 3 p.m. Avoid getting sunburned. Sunburn can increase your risk of skin cancer.  Talk to your doctor about taking a calcium plus vitamin D supplement. Ask about what type of calcium is right for you, and how much to take at a time. Adults ages 67 to 47 should not get more than 2,500 mg of calcium and 4,000 IU of vitamin D each day, whether it is from supplements and/or food. Adults ages 21 and older should not get more than 2,000 mg of calcium and 4,000 IU of vitamin D each day from supplements and/or food.  Get regular bone-building exercise. Weight-bearing and resistance exercises keep bones healthy by working the muscles and bones against gravity. Start out at an exercise level that feels right for you. Add a little at a time until you can do the following:  Do 30 minutes of weight-bearing exercise on most days of the week. Walking, jogging, stair climbing, and dancing are good choices.  Do resistance exercises with weights or elastic bands 2 to 3 days a week.  Limit alcohol. Drink no more than 1 alcohol drink a day if you are a woman. Drink no more than 2 alcohol drinks a day if you are a man.  Do not smoke. Smoking can make bones thin faster. If you need help quitting, talk to your doctor about stop-smoking programs and medicines. These can increase your chances of quitting for good.  When should you call for help?  Watch closely for changes in your health, and be sure to contact your doctor if:  You need help with a healthy eating plan.  You need help with an exercise plan     2006-2012 [employer]. Care instructions adapted under license by [facility]. This  care instruction is for use with your licensed healthcare professional. If you have questions about a medical condition or this instruction, always ask your healthcare professional. [employer], Inc[employer] disclaims any warranty or liability for your use of this information.  Content Version: 9.4.94723; Last Revised: November 29, 2009                Keeping Home a [place]       As we get older, changes in balance, gait, strength, vision, hearing, and cognition make even the most youthful senior more prone to accidents. Falls are one of the leading health risks for older people. This increased risk of falling is related to:   Aging process (eg, decreased muscle strength, slowed reflexes)   Higher incidence of chronic health problems (eg, arthritis, diabetes) that may limit mobility, agility or sensory awareness   Side effects of medicine (eg, dizziness, blurred vision)especially medicines like prescription pain medicines and drugs used to treat mental health conditions   Depending on the brittleness of your bones, the consequences of a fall can be serious and long lasting.   Home Life   Research by the Association of Aging [facility]) shows that some home accidents among older adults can  be prevented by making simple lifestyle changes and basic modifications and repairs to the home environment. Here are some lifestyle changes that experts recommend:   Have your hearing and vision checked regularly. Be sure to wear prescription glasses that are right for you.   Speak to your doctor or pharmacist about the possible side effects of your medicines. A number of medicines can cause dizziness.   If you have problems with sleep, talk to your doctor.   Limit your intake of alcohol.   If necessary, use a cane or Dineen Conradt to help maintain your balance.   Wear supportive, rubber-soled shoes, even at home. If you live in a region that gets wintry weather, you may want to put special cleats on your shoes to prevent you from slipping on  the snow and ice.   Exercise regularly to help maintain muscle tone, agility, and balance.   Always hold the banister when going up or down stairs. Also, use grip bars when getting in or out of the bath or shower, or using the toilet.   To avoid dizziness, get up slowly from a lying down position. Sit up first, dangling your legs for a minute or two before rising to a standing position.   Overall Home Safety Check   According to the Consumer Product Safety Commision's "Older Consumer Home Safety Checklist," it is important to check for potential hazards in each room. And remember, proper lighting is an essential factor in home safety. If you cannot see clearly, you are more likely to fall.   Important questions to ask yourself include:   Are lamp, electric, extension, and telephone cords placed out of the flow of traffic and maintained in good condition? Have frayed cords been replaced?   Are all small rugs and runners slip resistant? If not, you can secure them to the floor with a special double-sided carpet tape.   Are smoke detectors properly locatedone on every floor of your home and one outside of every sleeping area? Are they in good working order? Are batteries replaced at least once a year?   Do you have a well-maintained carbon monoxide detector outside every sleeping are in your home?   Does your furniture layout leave plenty of space to maneuver between and around chairs, tables, beds, and sofas?   Are hallways, stairs and passages between rooms well lit? Can you reach a lamp without getting out of bed?   Are floor surfaces well maintained? Shag rugs, high-pile carpeting, tile floors, and polished wood floors can be particularly slippery. Stairs should always have handrails and be carpeted or fitted with a non-skid tread.   Is your telephone easily reachable. Is the cord safely tucked away?   Room by Room   According to the Association of Aging, bathrooms and kitchens are the two most potentially  hazardous rooms in your home.   In the Kitchen    Be sure your stove is in proper working order and always make sure burners and the oven are off before you go out or go to sleep.    Keep pots on the back burners, turn handles away from the front of the stove, and keep stove clean and free of grease build-up.    Kitchen ventilation systems and range exhausts should be working properly.    Keep flammable objects such as towels and pot holders away from the cooking area except when in use. Make sure kitchen curtains are tied back.    Move cords and  appliances away from the sink and hot surfaces. If extension cords are needed, install wiring guides so they do not hang over the sink, range, or working areas.    Look for coffee pots, kettles and toaster ovens with automatic shut-offs.    Keep a mop handy in the kitchen so you can wipe up spills instantly. You should also have a small Government social research officer.    Arrange your kitchen with frequently used items on lower shelves to avoid the need to stand on a stepstool to reach them.    Make sure countertops are well-lit to avoid injuries while cutting and preparing food.    In the Bathroom    Use a non-slip mat or decals in the tub and shower, since wet, soapy tile or porcelain surfaces are extremely slippery.    Make sure bathroom rugs are non-skid or tape them firmly to the floor. Bathtubs should have at least one, preferably two, grab bars, firmly attached to structural supports in the wall. (Do not use built-in soap holders or glass shower doors as grab bars.)    Tub seats fitted with non-slip material on the legs allow you to wash sitting down. For people with limited mobility, bathtub transfer benches allow you to slide safely into the tub.    Raised toilet seats and toilet safety rails are helpful for those with knee or hip problems.    In the Bedroom    Make sure you use a nightlight and that the area around your bed is clear of potential obstacles.    Be careful with  electric blankets and never go to sleep with a heating pad, which can cause serious burns even if on a low setting.    Use fire-resistant mattress covers and pillows, and NEVER smoke in bed.    Keep a phone next to the bed that is programmed to dial 911 at the push of a button.      If you have a chronic condition, you may want to sign on with an automatic call-in service. Typically the system includes a small pendant that connects directly to an emergency medical voice-response system. You should also make arrangements to stay in contact with someonefriend, neighbor, family memberon a regular schedule.   Fire Prevention   According to the [employer] (Smoke Alarms for Every) Home Foundation, senior citizens are one of the two highest risk groups for death and serious injuries due to residential fires.   When cooking, wear short-sleeved items, never a bulky long-sleeved robe.    The CPSC's Safety Checklist for Older Consumers emphasizes the importance of checking basements, garages, workshops and storage areas for fire hazards, such as volatile liquids, piles of old rags or clothing and overloaded circuits.    Never smoke in bed or when lying down on a couch or recliner chair.    Small portable electric or kerosene heaters are responsible for many home fires and should be used cautiously if at all. If you do use one, be sure to keep them away from flammable materials.    In case of fire, make sure you have a pre-established emergency exit plan.    Have a professional check your fireplace and other fuel-burning appliances yearly.    Helping Hands   Baby boomers entering the golden years will continue to see the development of new products to help older adults live safely and independently in spite of age-related changes.  Making Life More Livable  , by [employer], lists  over 1,000 products for "living well in the mature years," such as bathing and mobility aids, household security devices, ergonomically  designed knives and peelers, and faucet valves and knobs for temperature control. Medical supply stores and organizations are good sources of information about products that improve your quality of life and insure your safety.     Last Reviewed: November 2009 Lamonte Richer, MD   Updated: 08/16/2009       Patient information: Weight loss treatments    INTRODUCTION--Obesity is a major international problem, and Americans are among the heaviest people in the world. The percentage of obese people in the [place] has risen steadily.  Many people find that although they initially lose weight by dieting, they quickly regain the weight after the diet ends. Because it so hard to keep weight off over time, it is important to have as much information and support as possible before starting a diet. You are most likely to be successful in losing weight and keeping it off when you believe that your body weight can be controlled.  STARTING A WEIGHT LOSS PROGRAM--Some people like to talk to their doctor or nurse to get help choosing the best plan, monitoring progress, and getting advice and support along the way.  To know what treatment (or combination of treatments) will work best, determine your body mass index (BMI) and waist circumference (measurement). The BMI is calculated from your height and weight.  A person with a BMI between 25 and 29.9 is considered overweight   A person with a BMI of 30 or greater is considered to be obese  A waist circumference greater than 35 inches (88 cm) in women and 40 inches (102 cm) in men increases the risk of obesity-related complications, such as heart disease and diabetes. People who are obese and who have a larger waist size may need more aggressive weight loss treatment than others. Talk to your doctor or nurse for advice.  Types of treatment--Based on your measurements and your medical history, your doctor or nurse can determine what combination of weight loss treatments would  work best for you. Treatments may include changes in lifestyle, exercise, dieting, and, in some cases, weight loss medicines or weight loss surgery. Weight loss surgery, also called bariatric surgery, is reserved for people with severe obesity who have not responded to other weight loss treatments.   SETTING A WEIGHT LOSS GOAL--It is important to set a realistic weight loss goal. Your first goal should be to avoid gaining more weight and staying at your current weight (or within 5 percent). Many people have a "dream" weight that is difficult or impossible to achieve.  People at high risk of developing diabetes who are able to lose 5 percent of their body weight and maintain this weight will reduce their risk of developing diabetes by about 50 percent and reduce their blood pressure. This is a success.  Losing more than 15 percent of your body weight and staying at this weight is an extremely good result, even if you never reach your "dream" or "ideal" weight.  LIFESTYLE CHANGES--Programs that help you to change your lifestyle are usually run by psychologists or other professionals. The goals of lifestyle changes are to help you change your eating habits, become more active, and be more aware of how much you eat and exercise, helping you to make healthier choices.  This type of treatment can be broken down into three steps:  The triggers that make you want to eat  Eating   What happens after you eat  Triggers to eat--Determining what triggers you to eat involves figuring out what foods you eat and where and when you eat. To figure out what triggers you to eat, keep a record for a few days of everything you eat, the places where you eat, how often you eat, and the emotions you were feeling when you ate.  For some people, the trigger is related to a certain time of day or night. For others, the trigger is related to a certain place, like sitting at a desk working.  Eating--You can change your eating habits by  breaking the chain of events between the trigger for eating and eating itself. There are many ways to do this. For instance, you can:  Limit where you eat to a few places (eg, dining room)   Restrict the number of utensils (eg, only a fork) used for eating   Drink a sip of water between each bite   Chew your food a certain number of times   Get up and stop eating every few minutes  What happens after you eat--Rewarding yourself for good eating behaviors can help you to develop better habits. This is not a reward for weight loss; instead, it is a reward for changing unhealthy behaviors.  Do not use food as a reward. Some people find money, clothing, or personal care (eg, a hair cut, manicure, or massage) to be effective rewards. Treat yourself immediately after making better eating choices to reinforce the value of the good behavior.  You need to have clear behavior goals, and you must have a time frame for reaching your goals. Reward small changes along the way to your final goal.  Other factors that contribute to successful weight loss--Changing your behavior involves more than just changing unhealthy eating habits; it also involves finding people around you to support your weight loss, reducing stress, and learning to be strong when tempted by food.  Establish a "buddy" system -- Having a friend or family member available to provide support and reinforce good behavior is very helpful. The support person needs to understand your goals.   Learn to be strong -- Learning to be strong when tempted by food is an important part of losing weight. As an example, you will need to learn how to say "no" and continue to say no when urged to eat at parties and social gatherings. Develop strategies for events before you go, such as eating before you go or taking low-calorie snacks and drinks with you.   Develop a support system -- Having a support system is helpful when losing weight. This is why many commercial groups are  successful. Family support is also essential; if your family does not support your efforts to lose weight, this can slow your progress or even keep you from losing weight.   Positive thinking -- People often have conversations with themselves in their head; these conversations can be positive or negative. If you eat a piece of cake that was not planned, you may respond by thinking, "Oh, you stupid idiot, you've blown your diet!" and as a result, you may eat more cake.  A positive thought for the same event could be, "Well, I ate cake when it was not on my plan. Now I should do something to get back on track." A positive approach is much more likely to be successful than a negative one.  Reduce stress -- Although stress is a part of everyday  life, it can trigger uncontrolled eating in some people. It is important to find a way to get through these difficult times without eating or by eating low-calorie food, like raw vegetables. It may be helpful to imagine a relaxing place that allows you to temporarily escape from stress. With deep breaths and closed eyes, you can imagine this relaxing place for a few minutes.   Self-help programs -- Self-help programs like [employer], Overeaters Anonymous, and [employer] (TOPS) work for some people. As with all weight loss programs, you are most likely to be successful with these plans if you make long-term changes in how you eat.  CHOOSING A DIET--A calorie is a unit of energy found in food. Your body needs calories to function. The goal of any diet is to burn up more calories than you eat.   How quickly you lose weight depends upon several factors, such as your age, gender, and starting weight.  Older people have a slower metabolism than young people, so they lose weight more slowly.   Men lose more weight than women of similar height and weight when dieting because they use more energy.   People who are extremely overweight lose weight more quickly than  those who are only mildly overweight.  Try not to drink alcohol or drinks with added sugar, and most sweets (candy, cakes, cookies), since they rarely contain important nutrients.  Portion-controlled diets--One simple way to diet is to buy packaged foods, like frozen low-calorie meals or meal-replacement canned drinks. A typical meal plan for one day may include:  A meal-replacement drink or breakfast bar for breakfast   A meal-replacement drink or a frozen low-calorie (250 to 350 calories) meal for lunch   A frozen low-calorie meal or other prepackaged, calorie-controlled meal, along with extra vegetables for dinner  This would give you 1000 to 1500 calories per day.  Low-fat diet--To reduce the amount of fat in your diet, you can:  Eat low-fat foods. Low-fat foods are those that contain less than 30 percent of calories from fat. Fat is listed on the food facts label (figure 1).   Count fat grams. For a 1500 calorie diet, this would mean about 45 g or fewer of fat per day.  Low-carbohydrate diet--Low- and very-low-carbohydrate diets (eg, Atkins diet, [employer]) have become popular ways to lose weight quickly.  With a very-low-carbohydrate diet, you eat between 0 and 60 grams of carbohydrates per day (a standard diet contains 200 to 300 grams of carbohydrates)   With a low-carbohydrate diet, you eat between 60 and 130 grams of carbohydrates per day  Carbohydrates are found in fruits, vegetables, and grains (including breads, rice, pasta, and cereal), alcoholic beverages, and in dairy products. Meat and fish do not contain carbohydrates.  Side effects of very-low-carbohydrate diets can include constipation, headache, bad breath, muscle cramps, diarrhea, and weakness.  Mediterranean diet--The term "Mediterranean diet" refers to a way of eating that is common in olive-growing regions around the [employer]. Although there is some variation in Mediterranean diets, there are some similarities. Most  Mediterranean diets include:  A high level of monounsaturated fats (from olive or canola oil, walnuts, pecans, almonds) and a low level of saturated fats (from butter)   A high amount of vegetables, fruits, legumes, and grains (7 to 10 servings of fruits and vegetables per day)   A moderate amount of milk and dairy products, mostly in the form of cheese. Use low-fat dairy products (skim  milk, fat-free yogurt, low-fat cheese).   A relatively low amount of red meat and meat products. Substitute fish or poultry for red meat.   For those who drink alcohol, a modest amount (mainly as red wine) may help to protect against cardiovascular disease. A modest amount is up to one (4 ounce) glass per day for women and up to two glasses per day for men.  Which diet is best?--Studies have compared different diets, including:  Very-low-carbohydrate (AtkinsT)   Macronutrient balance controlling glycemic load (Zone)   Reduced-calorie (Weight Watchers)   Very-low-fat (Ornish)  No one diet is "best" for weight loss. Any diet will help you to lose weight if you stick with the diet. Therefore, it is important to choose a diet that includes foods you like.  Fad diets--Fad diets often promise quick weight loss (more than 1 to 2 pounds per week) and may claim that you do not need to exercise or give up favorite foods. Some fad diets cost a lot of money, because you have to pay for seminars or pills. Fad diets generally lack any scientific evidence that they are safe and effective, but instead rely on "before" and "after" photos or testimonials.  Diets that sound too good to be true usually are. These plans are a waste of time and money and are not recommended. A doctor, nurse, or nutritionist can help you find a safe and effective way to lose weight and keep it off.  WEIGHT LOSS MEDICINES--Taking a weight loss medicine may be helpful when used in combination with diet, exercise, and lifestyle changes. However, it is important to  understand the risks and benefits of these medicines. It is also important to be realistic about your goal weight using a weight loss medicine; you may not reach your "dream" weight, but you may be able to reduce your risk of diabetes or heart disease.   Weight loss medicines may be recommended for people who have not been able to lose weight with diet and exercise who have a:  BMI of 30 or more    BMI between 27 and 29.9 and have other medical problems, such as diabetes, high cholesterol, or high blood pressure  Two weight loss medicines are approved in the [place] for long-term use. These are sibutramineand orlistat.  Other weight loss medicines (phentermine, diethylpropion) are available but are only approved for short-term use (up to 12 weeks).  Sibutramine--Sibutramine(Meridia, Reductil) is a medicine that reduces your appetite. In people who take the medicine for one year, the average weight loss is 10 percent of the initial body weight (5 percent more than those who took a placebo treatment).  Side effects of sibutramineinclude insomnia, dry mouth, and constipation. Increases in blood pressure can occur. Therefore, blood pressure is usually monitored during treatment. There is no evidence that sibutramine causes heart or lung problems (like dexfenfluramine and fenfluramine (Phen/Fen)). However, experts agree that sibutramine should not used by people with coronary heart disease, heart failure, uncontrolled hypertension, stroke, irregular heart rhythms, or peripheral vascular disease (poor circulation in the legs).  Orlistat--Orlistat(Xenical 120 mg capsules) is a medicine that reduces the amount of fat your body absorbs from the foods you eat. A lower-dose version is now available without a prescription (Alli 60 mg capsules) in many countries, including the [place]. The medicine is recommended three times per day, taken with a meal; you can skip a dose if you skip a meal or if the meal  contains no fat.  After  one year of treatment with orlistat, the average weight loss is approximately 8 to 10 percent of initial body weight (4 percent more than in those who took a placebo). Cholesterol levels often improve, and blood pressure sometimes falls. In people with diabetes, orlistat may help control blood sugar levels.  Side effects occur in 15 to 10 percent of people and may include stomach cramps, gas, diarrhea, leakage of stool, or oily stools. These problems are more likely when you take orlistatwith a high-fat meal (if more than 30 percent of calories in the meal are from fat). Side effects usually improve as you learn to avoid high-fat foods. Severe liver injury has been reported rarely in patients taking orlistat, but it is not known if orlistat caused the liver problems.  Diet supplements--Diet supplements are widely used by people who are trying to lose weight, although the safety and efficacy of these supplements are often unproven. A few of the more common diet supplements are discussed below; none of these are recommended because they have not been studied carefully, and there is no proof they are safe or effective.  Chitosanand wheat dextrin are ineffective for weight loss, and their use is not recommended.   Ephedra, a compound related to ephedrine, is no longer available in the [place] due to safety concerns. Many nonprescription diet pills previously contained ephedra. Although some studies have shown that ephedra helps with weight loss, there can be serious side effects (psychiatric symptoms, palpitations, and stomach upset), including death.   There are not enough data about the safety and efficacy of chromium, ginseng, glucomannan, green tea, hydroxycitric acid, L carnitine, psyllium, pyruvate supplements, St. Johns wort, and conjugated linoleic acid.   Two supplements from [place], Emagrece Sim (also known as the [place] diet pill) and Herbathin dietary supplement, have  been shown to contain prescription drugs.   Hoodia gordonii is a dietary supplement derived from a plant in [place]. It is not recommended because there is no proof that it is safe or effective.   Bitter orange (Citrus aurantium) can increase your heart rate and blood pressure and is not recommended.  SHOULD I HAVE SURGERY TO LOSE WEIGHT? -- Weight loss surgery is recommended ONLY for people with one of the following:  Severe obesity (body mass index above 40) (calculator 1 and calculator 2) who have not responded to diet, exercise, or weight loss medicines   Body mass index between 35 and 40, along with a serious medical problem (including diabetes, severe joint pain, or sleep apnea) that would improve with weight loss  You should be sure that you understand the potential risks and benefits of weight loss surgery. You must be motivated and willing to make lifelong changes in how you eat to reach and maintain a healthier weight after surgery. You must also be realistic about weight loss after surgery (see 'Effectiveness of weight loss surgery' below).  PREPARING FOR WEIGHT LOSS SURGERY -- Most people who have weight loss surgery will meet with several specialists before surgery is scheduled. This often includes a dietitian, mental health counselor, a doctor who specializes in care of obese people, and a surgeon who performs weight loss surgery (bariatric surgeon). You may need to work with these providers for several weeks or months before surgery.  The nutritionist will explain what and how much you will be able to eat after surgery. You may also need to lose a small amount of weight before surgery.   The mental health specialist will help  you to cope with stress and other factors that can make it harder to lose weight or trigger you to eat   The medical doctor will determine whether you need other tests, counseling, or treatment before surgery. He or she might also help you begin a medical weight loss program  so that you can lose some weight before surgery.   The bariatric surgeon will meet with you to discuss the surgeries available to treat obesity. He or she will also make sure you are a good candidate for surgery.   TYPES OF WEIGHT LOSS SURGERY -- There are several types of weight loss surgeries, the most common being lap banding, gastric bypass, and gastric sleeve.  Lap banding -- Laparoscopic adjustable gastric banding (LAGB), or lap banding, is a surgery that uses an adjustable band around the opening to the stomach (figure 1). This reduces the amount of food that you can eat at one time.  Lap banding is done through small incisions, with a laparoscope. The band can be adjusted after surgery, allowing you to eat more or less food. Adjustments to the size and tightness of the band are made by using a needle to add or remove fluid from a port (a small container under the skin that is connected to the band). Adding fluid to the band makes it tighter which restricts the amount of food you can eat and may help you to lose more weight.  Lap banding is a popular choice because it is relatively simple to perform, can be adjusted or removed, and has a low risk of serious complications immediately after surgery. However, weight loss with the lap band depends on your ability to follow the program closely.  You will need to prepare nutritious meals that "work with" the band, not against it. For example, the lap band will not work well if you eat or drink a large amount of liquid calories (like ice cream). The band will not help you to feel full when you eat/drink liquid calories.  Weight loss ranges from 45 to 75 percent after two years. As an example, a person who is 120 pounds overweight could expect to lose approximately 54 to 90 pounds in the two years after lap banding.  Gastric bypass -- Roux-en-Y gastric bypass, also called gastric bypass, helps you to lose weight by reducing the amount of food you can eat and reducing  the number of calories and nutrients you absorb from the food you eat.  To perform gastric bypass, a surgeon creates a small stomach pouch by dividing the stomach and attaching it to the small intestine. This helps you to lose weight in two ways:  The smaller stomach can hold less food than before surgery. This causes you to feel full after eating a very small amount of food or liquid. Over time, the pouch might stretch, allowing you to eat more food.   The body absorbs fewer calories, since food bypasses most of the stomach as well as the upper small intestine. This new arrangement seems to decrease your appetite and change how you break down foods by changing the release of various hormones.  Gastric bypass can be performed as open surgery (through an incision on the abdomen) or laparoscopically, which uses smaller incisions and smaller instruments. Both the laparoscopic and open techniques have risks and benefits. You and your surgeon should work together to decide which surgery, if any, is right for you.  Gastric bypass has a high success rate, and people lose  an average of 62 to 68 percent of their excess body weight in the first year. Weight loss typically levels off after one to two years, with an overall excess weight loss between 50 and 75 percent. For a person who is 120 pounds overweight, an average of 60 to 90 pounds of weight loss would be expected.  Gastric sleeve -- Gastric sleeve, also known as sleeve gastrectomy, is a surgery that reduces the size of the stomach and makes it into a narrow tube (figure 3). The new stomach is much smaller and produces less of the hormone (ghrelin) that causes hunger, helping you feel satisfied with less food.  Sleeve gastrectomy is safer than gastric bypass because the intestines are not rearranged, and there is less chance of malnutrition. It also appears to control hunger better than lap banding. It might be safer than the lap banding because no foreign materials  are used.  The gastric sleeve has a good success rate, and people lose an average of 33 percent of their excess body weight in the first year. For a person who is 120 pounds overweight, this would mean losing about 40 pounds in the first year.  WEIGHT LOSS SURGERY COMPLICATIONS -- A variety of complications can occur with weight loss surgery. The risks of surgery depend upon which surgery you have and any medical problems you had before surgery. Some of the more common early surgical complications (one to six weeks after surgery) include:  Bleeding   Infection   Blockage or tear in the bowels   Need for further surgery  Important medical complications after surgery can include blood clots in the legs or lungs, heart attack, pneumonia, and urinary tract infection.   Complications are less likely when surgery is performed in centers that are experienced in weight loss surgery. In general, centers with experience in weight loss surgery have:  Board-certified doctors and surgeons   A team of support staff (dietitians, counselors, nurses)   Long-term follow-up after surgery   Hospital staff experienced with the care of weight loss patients. This includes nurses who are trained in the care of patients immediately after surgery and anesthesiologists who are experienced in caring for the morbidly obese.  EFFECTIVENESS OF WEIGHT LOSS SURGERY -- The goal of weight loss surgery is to reduce the risk of illness or death associated with obesity. Weight loss surgery can also help you to feel and look better, reduce the amount of money you spend on medicines, and cut down on sick days.   As an example, weight loss surgery can improve health problems related to obesity (diabetes, high blood pressure, high cholesterol, sleep apnea) to the point that you need less or no medicine.  Finally, weight loss surgery might reduce your risk of developing heart disease, cancer, and certain infections.  AFTER WEIGHT LOSS SURGERY -- You will  need to stay in the hospital until your team feels that it is safe for you to leave (on average, one to three days). Do not drive if you are taking prescription pain medicine. Begin exercising as soon as possible once you have healed; most weight loss centers will design an exercise program for you.  Once you are home, it is important to eat and drink exactly what your doctor and dietitian recommend. You will see your doctor, nurse, and dietitian on a regular basis after surgery to monitor your health, diet, and weight loss.   You will be able to slowly increase how much you eat over  time, although it will always be important to:  Eat small, frequent meals and not skip meals   Chew your food slowly and completely   Avoid eating while "distracted" (such as eating while watching TV)   Stop eating when you feel full   Drink liquids at least 30 minutes before or after eating   Avoid foods high in fat or sugar   Take vitamin supplements, as recommended  It can take several months to learn to listen to your body so that you know when you are hungry and when you are full. You may dislike foods you previously loved, and you may begin to prefer new foods. This can be a frustrating process for some people, so talk to your dietitian if you are having trouble.  It usually takes between one and two years to lose weight after surgery. After reaching their goal weight, some people have plastic surgery (called "body contouring") to remove excess skin from the body, particularly in the abdominal area.  Before you decide to have weight loss surgery, you must commit to staying healthy for life. This includes following up with your healthcare team, exercising most days of the week, and eating a sensible diet every day. It can be difficult to develop new eating and exercise habits after weight loss surgery, and you will have to work hard to stick to your goals.  Recovering from surgery and losing weight can be stressful and emotional,  and it is important to have the support of family and friends. Working with a Child psychotherapist, therapist, or support group can help you through the ups and downs.  WHERE TO GET MORE INFORMATION--Your healthcare provider is the best source of information for questions and concerns related to your medical problem.  This article will be updated as needed every four months on our Web site ([email or web address])

## 2023-03-01 NOTE — Assessment & Plan Note (Signed)
stable on Protonix 40 mg daily.

## 2023-03-01 NOTE — Assessment & Plan Note (Signed)
Patient sees pulmonologist was on albuterol and Advair.  Follow-up with specialist

## 2023-03-01 NOTE — Consults (Signed)
Session ID: [id number]  Language: Nepali  Interpreter ID: [id number]  Interpreter Name: Terie Purser

## 2023-03-01 NOTE — Assessment & Plan Note (Signed)
stable on Ubrelvy and Topamax.  Patient also takes magnesium supplement.  Referred to new neurologist

## 2023-03-01 NOTE — Progress Notes (Signed)
 Medicare Annual Wellness Visit    Michael Knight is here for No chief complaint on file.    Assessment & Plan   Initial Medicare annual wellness visit  Assessment & Plan:   Patient comes in for Medicare AWV.   we discussed age appropriate USPSTF screens  Over 75% of the visit was spent counselling patient on appropriate lifestyle choices.      Orders:  -     Hep C AB RLFX HCV PCR-A; Future  -     CBC with Auto Differential; Future  -     Comprehensive Metabolic Panel; Future  -     HIV Screen; Future  -     Lipid Panel; Future  -     Magnesium ; Future  -     TSH with Reflex; Future  -     Vitamin D  25 Hydroxy; Future  -     PSA Screening; Future  Essential hypertension  Assessment & Plan:   Stable  Continue anti-hypertensive medication as documented in your medication list amlodipine  10 mg daily  To verify blood pressure cuff accuracy  To keep outpatient BP log,  counseled on exercise and diet (including DASH diet)  Goal to achieve appropriate BMI  Patient agreed with plan with verbal understanding   Orders:  -     amLODIPine  (NORVASC ) 10 MG tablet; Take 1 tablet by mouth daily, Disp-30 tablet, R-11Normal  -     propranolol  (INDERAL ) 40 MG tablet; Take 1 tablet by mouth daily (with breakfast), Disp-30 tablet, R-5Normal  Dyslipidemia  Assessment & Plan:    stable on Pravachol  40 mg nightly  Orders:  -     Lipid Panel; Future  Acquired hypothyroidism  Assessment & Plan:    stable on levothyroxine  75 mcg patient due for blood work  Orders:  -     levothyroxine  (SYNTHROID ) 75 MCG tablet; Take 1 tablet by mouth Daily, Disp-30 tablet, R-11Normal  Gastroesophageal reflux disease without esophagitis  Assessment & Plan:    stable on Protonix  40 mg daily  Orders:  -     pantoprazole  (PROTONIX ) 40 MG tablet; Take 1 tablet by mouth every morning (before breakfast), Disp-90 tablet, R-1Normal  Chronic cough  Assessment & Plan:    Patient sees pulmonologist was on albuterol  and Advair .  Follow-up with specialist  Orders:  -      fluticasone -salmeterol (ADVAIR  DISKUS) 250-50 MCG/ACT AEPB diskus inhaler; Inhale 1 puff into the lungs in the morning and 1 puff in the evening., Disp-60 each, R-3Rinse mouth out with water  after each administrationNormal  Migraine with aura and without status migrainosus, not intractable  Assessment & Plan:    stable on Ubrelvy  and Topamax .  Patient also takes magnesium  supplement.  Referred to new neurologist  Orders:  -     propranolol  (INDERAL ) 40 MG tablet; Take 1 tablet by mouth daily (with breakfast), Disp-30 tablet, R-5Normal  Prediabetes  Assessment & Plan:   A1C:   Lab Results   Component Value Date/Time    LABA1C 6.1 02/22/2023 02:38 PM     Stable- continue home blood sugar monitoring  Counselled on Diet and exercise with goal to achieve appropriate Patient agreed with plan with verbal understanding     Orders:  -     CBC with Auto Differential; Future  -     Comprehensive Metabolic Panel; Future  Chronic pain of left ankle  -     meloxicam  (MOBIC ) 7.5 MG tablet; Take 1 tablet by mouth  2 times daily, Disp-30 tablet, R-3Normal  Insomnia, unspecified type  -     traZODone  (DESYREL ) 50 MG tablet; Take 1 tablet by mouth nightly 1 and 1/2 tabs, Disp-45 tablet, R-5Normal  Vertigo  -     meclizine  (ANTIVERT ) 12.5 MG tablet; Take 1 tablet by mouth 3 times daily as needed for Dizziness, Disp-90 tablet, R-3Normal  Other fatigue  -     TSH with Reflex; Future  Vitamin D  deficiency  -     Vitamin D  25 Hydroxy; Future  Screening PSA (prostate specific antigen)  -     PSA Screening; Future  Encounter for hepatitis C screening test for low risk patient  -     Hep C AB RLFX HCV PCR-A; Future  Encounter for screening for HIV  -     HIV Screen; Future  Recommendations for Preventive Services Due: see orders and patient instructions/AVS.  Recommended screening schedule for the next 5-10 years is provided to the patient in written form: see Patient Instructions/AVS.     Return in about 2 months (around 05/01/2023) for  Routine follow-up, 15-minutes .     Subjective   The following acute and/or chronic problems were also addressed today:    Medicare AWV - Patient presents today for Medicare AWV. Patient  reports feeling well. Patient has a normal appetite. Patient  eats 5+ servings of vegetables/fruits each day. Patient prepares food at home multiple times/day, eats in restaurants rarely. Patient  states that s he sleeps well and gets 7-8 hours of sleep on average. In regards to emotional health, patient  denies depression or anxiety. Libido is considered to be normal. In regards to bowel habits, patient  has no complaints. Regarding urination, no complaints. Patient reports they feels safe at home, uses seat belts and has smoke detectors.     Health Maintenance    Annual retinal eye exam - 8/24  Annual Dentist visit - 2023  Tobacco smoking  - Quit 2012  Alcohol Misuse - NO  Illicit Drug Use- NO  Healthy Diet and physical activity - YES  Obesity Screen- screened  Wears seat belt- YES  End of life directives discussed with patient.-Mentions he does not have  a will/or/an advanced directives. Was instructed to start process looking into preparing his will an advanced directives.      Well adult exam  -  Anticipatory Guidance  Injury Prevention  Lap-shoulder belts, Smoke detectors, Carbon monoxide detectors, and  Occupational risk counseling  Substance Abuse  - Tobacco/alcohol/drug cessation or never starting any of those. Include pharmacotherapy, social support for cessation if applicable to patient, and skills training/problem solving. Availability of treatment for abuse.    Sexual Behavior  - STD prevention; abstinence; avoid high-risk behavior; condoms/male barrier with spermicide,  Contraception   Diet and Exercise   - Limit fat and cholesterol; maintain caloric balance; emphasized grains, fruits and vegetables. Moderation in sodium/caffeine  intake, saturated fat and cholesterol, caloric balance, sufficient intake of fresh  fruits, vegetables, fiber, calcium. Regular physical activity at least 150 minutes per week to maintain activity. Stressed the importance of regular exercise.    Protection from UV Light: Discussed using Hats and sun block when exposed to direct sunlight. To schedule general skin exam with Dermatology.  Abuse and Violence: violence prevention at home, school and in social situations  Dental Health: Discussed importance of regular tooth brushing, flossing, and dental visits.  Immunizations reviewed : Discussed with patient     Patient's complete Health Risk  Assessment and screening values have been reviewed and are found in Flowsheets. The following problems were reviewed today and where indicated follow up appointments were made and/or referrals ordered.    Positive Risk Factor Screenings with Interventions:    Fall Risk:  Do you feel unsteady or are you worried about falling? : (!) yes  2 or more falls in past year?: no  Fall with injury in past year?: no     Interventions:    Reviewed medications, home hazards, visual acuity, and co-morbidities that can increase risk for falls           Self-assessment of health:  In general, how would you say your health is?: (!) Poor    Interventions:  Patient advised to follow-up in this office for further evaluation and treatment          Vision Screen:  Do you have difficulty driving, watching TV, or doing any of your daily activities because of your eyesight?: No  Have you had an eye exam within the past year?: (!) No  Interventions:   Patient encouraged to make appointment with their eye specialist     ADL's:   Patient reports needing help with:  Select all that apply: (!) Grooming, Dressing  Select all that apply: [employer], Banking/Finances, Pharmacologist, [employer], Presenter, Broadcasting, Transportation, Taking Medications  Interventions:  Patient advised to follow up in the office for further evaluation and treatment    Advanced Directives:  Do you have a Living Will?: (!)  No    Intervention:  Provided patient with Living Will          Objective   Vitals:    03/01/23 0846   BP: 128/82   Site: Left Upper Arm   Position: Sitting   Cuff Size: Medium Adult   Pulse: 79   Resp: 14   Temp: 98.5 F (36.9 C)   TempSrc: Infrared   SpO2: 99%   Weight: 66.7 kg (147 lb)   Height: 1.575 m (5' 2)      Body mass index is 26.89 kg/m.         Patient in no acute respiratory distress or painful distress.  Normal appearance, not ill looking    Normocephalic, PERRL, EOM movements intact, no nystagmus, bi-lateral external auditory canal and tympanic membranes WNL, normal oro-pharynx.    Mucous membrnes moist and pink. No scleral icterus    Vesicular BS, no wheeze ronchi, crackles or other adventitious sounds    S1S2 regular rate and rhythm, no murmur gallops or rubs    Soft depressible not tender on palpation, no guarding or rebound, no signs of ascites    No signs of edema to legs or feet/    Alert and Oriented to time, place and person. No focal signs or neurological deficits. Muscle power grade 5/5 in major muscle groups of all 4 limbs. Cranial nerves III-XII intact and WNL.          No Known Allergies  Prior to Visit Medications    Medication Sig Taking? Authorizing Provider   fluticasone -salmeterol (ADVAIR  DISKUS) 250-50 MCG/ACT AEPB diskus inhaler Inhale 1 puff into the lungs in the morning and 1 puff in the evening. Yes Curt Oatis, Cara KIDD, MD   meclizine  (ANTIVERT ) 12.5 MG tablet Take 1 tablet by mouth 3 times daily as needed for Dizziness Yes Thena Devora O, MD   meloxicam  (MOBIC ) 7.5 MG tablet Take 1 tablet by mouth 2 times daily Yes Vannie Cara KIDD, MD  levothyroxine  (SYNTHROID ) 75 MCG tablet Take 1 tablet by mouth Daily Yes Antania Hoefling O, MD   amLODIPine  (NORVASC ) 10 MG tablet Take 1 tablet by mouth daily Yes Braelynne Garinger O, MD   pantoprazole  (PROTONIX ) 40 MG tablet Take 1 tablet by mouth every morning (before breakfast) Yes Ronnald Shedden O, MD   traZODone  (DESYREL ) 50 MG tablet Take  1 tablet by mouth nightly 1 and 1/2 tabs Yes Roselynn Whitacre, Cara KIDD, MD   propranolol  (INDERAL ) 40 MG tablet Take 1 tablet by mouth daily (with breakfast) Yes Miliana Gangwer, Cara KIDD, MD   albuterol  sulfate HFA (VENTOLIN  HFA) 108 (90 Base) MCG/ACT inhaler Inhale 2 puffs into the lungs 4 times daily as needed for Wheezing Yes Vidal Lampkins, Cara KIDD, MD   pravastatin  (PRAVACHOL ) 40 MG tablet Take 1 tablet by mouth nightly Yes Hunter Pinkard O, MD   topiramate  (TOPAMAX ) 100 MG tablet Take 1 tablet by mouth nightly Yes Francely Craw O, MD   vitamin D  (ERGOCALCIFEROL ) 1.25 MG (50000 UT) CAPS capsule Take 1 capsule by mouth Once a week at 5 PM Yes Eros Montour O, MD   Ubrogepant  (UBRELVY ) 100 MG TABS Take 100 mg by mouth daily as needed (migraine headaches) Yes Emmalin Jaquess O, MD   magnesium  gluconate (MAGONATE) 500 MG tablet Take 1 tablet by mouth 2 times daily Yes Ahamed Hofland O, MD   hydroCHLOROthiazide (MICROZIDE) 12.5 MG capsule Take 12.5 mg by mouth daily  [provider]       CareTeam (Including outside providers/suppliers regularly involved in providing care):   Patient Care Team:  Vannie Cara KIDD, MD as PCP - General (Internal Medicine)  Vannie Cara KIDD, MD as PCP - Empaneled Provider      Reviewed and updated this visit:  Allergies

## 2023-03-01 NOTE — Assessment & Plan Note (Signed)
A1C:   Lab Results   Component Value Date/Time    LABA1C 6.1 02/22/2023 02:38 PM     Stable- continue home blood sugar monitoring  Counselled on Diet and exercise with goal to achieve appropriate Patient agreed with plan with verbal understanding

## 2023-03-01 NOTE — Assessment & Plan Note (Signed)
stable on levothyroxine 75 mcg patient due for blood work

## 2023-03-01 NOTE — Progress Notes (Signed)
[id number]   [facility] #[id number]

## 2023-03-02 ENCOUNTER — Encounter

## 2023-03-02 LAB — COMPREHENSIVE METABOLIC PANEL
ALT: 30 U/L (ref 10–40)
AST: 29 U/L (ref 15–37)
Albumin/Globulin Ratio: 1.7 (ref 1.1–2.2)
Albumin: 4.8 g/dL (ref 3.4–5.0)
Alkaline Phosphatase: 102 U/L (ref 40–129)
Anion Gap: 15 (ref 3–16)
BUN: 9 mg/dL (ref 7–20)
CO2: 22 mmol/L (ref 21–32)
Calcium: 10.1 mg/dL (ref 8.3–10.6)
Chloride: 105 mmol/L (ref 99–110)
Creatinine: 1.1 mg/dL (ref 0.8–1.3)
Est, Glom Filt Rate: 74 (ref >60)
Glucose: 97 mg/dL (ref 70–99)
Potassium: 4.3 mmol/L (ref 3.5–5.1)
Sodium: 142 mmol/L (ref 136–145)
Total Bilirubin: 0.9 mg/dL (ref 0.0–1.0)
Total Protein: 7.6 g/dL (ref 6.4–8.2)

## 2023-03-02 LAB — LIPID PANEL
Cholesterol, Total: 202 mg/dL — ABNORMAL HIGH (ref 0–199)
HDL: 48 mg/dL (ref 40–60)
LDL Cholesterol: 127 mg/dL — ABNORMAL HIGH (ref <100)
Triglycerides: 137 mg/dL (ref 0–150)
VLDL Cholesterol Calculated: 27 mg/dL

## 2023-03-02 LAB — TSH REFLEX TO FT4: TSH: 0.14 u[IU]/mL — ABNORMAL LOW (ref 0.27–4.20)

## 2023-03-02 LAB — CBC WITH AUTO DIFFERENTIAL
Basophils %: 0.5 %
Basophils Absolute: 0 10*3/uL (ref 0.0–0.2)
Eosinophils %: 0.9 %
Eosinophils Absolute: 0.1 10*3/uL (ref 0.0–0.6)
Hematocrit: 46.1 % (ref 40.5–52.5)
Hemoglobin: 15.6 g/dL (ref 13.5–17.5)
Lymphocytes %: 20.8 %
Lymphocytes Absolute: 1.3 10*3/uL (ref 1.0–5.1)
MCH: 30.3 pg (ref 26.0–34.0)
MCHC: 33.8 g/dL (ref 31.0–36.0)
MCV: 89.5 fL (ref 80.0–100.0)
MPV: 10.3 fL (ref 5.0–10.5)
Monocytes %: 6.6 %
Monocytes Absolute: 0.4 10*3/uL (ref 0.0–1.3)
Neutrophils %: 71.2 %
Neutrophils Absolute: 4.4 10*3/uL (ref 1.7–7.7)
Platelets: 222 10*3/uL (ref 135–450)
RBC: 5.15 M/uL (ref 4.20–5.90)
RDW: 13.6 % (ref 12.4–15.4)
WBC: 6.2 10*3/uL (ref 4.0–11.0)

## 2023-03-02 LAB — T4, FREE: T4 Free: 1.9 ng/dL — ABNORMAL HIGH (ref 0.9–1.8)

## 2023-03-02 LAB — VITAMIN D 25 HYDROXY: Vit D, 25-Hydroxy: 77.2 ng/mL (ref >=30)

## 2023-03-02 LAB — MAGNESIUM: Magnesium: 2.25 mg/dL (ref 1.80–2.40)

## 2023-03-02 LAB — HIV SCREEN
HIV ANTIGEN: NONREACTIVE
HIV Ag/Ab: NONREACTIVE
HIV-1 Antibody: NONREACTIVE
HIV-2 Ab: NONREACTIVE

## 2023-03-02 LAB — PSA SCREENING: PSA: 2.24 ng/mL (ref 0.00–4.00)

## 2023-03-02 MED ORDER — LEVOTHYROXINE SODIUM 125 MCG PO TABS
125 MCG | ORAL_TABLET | Freq: Every day | ORAL | 5 refills | Status: DC
Start: 2023-03-02 — End: 2023-09-25

## 2023-03-02 MED ORDER — PRAVASTATIN SODIUM 80 MG PO TABS
80 | ORAL_TABLET | Freq: Every evening | ORAL | 5 refills | Status: AC
Start: 2023-03-02 — End: ?

## 2023-03-02 MED ORDER — LEVOTHYROXINE SODIUM 125 MCG PO TABS
125 | ORAL_TABLET | Freq: Every day | ORAL | 5 refills | Status: DC
Start: 2023-03-02 — End: 2023-03-02

## 2023-03-02 NOTE — Progress Notes (Signed)
 The 10-year ASCVD risk score (Arnett DK, et al., 2019) is: 16.3%    Values used to calculate the score:      Age: 67 years      Sex: Male      Is Non-Hispanic African American: No      Diabetic: No      Tobacco smoker: No      Systolic Blood Pressure: 128 mmHg      Is BP treated: Yes      HDL Cholesterol: 48 mg/dL      Total Cholesterol: 202 mg/dL

## 2023-03-02 NOTE — Result Encounter Note (Signed)
 Please use Nepali interpreter service.    Please let patient know his thyroid result shows that he is on too much thyroid medicine.  I am going to decrease his dose to 62.5 mcg from 75 mcg.  To get this dose he will have to take HALF of a 125 mcg tablet.    This is very important, he could purchase a pill cutter from any pharmacy or [employer] these are very cheap.    Please let patient know that their LDL/Badcholesterol is 127. His LDL should be under 100 to lower the incidence/risk of CVD which includes heart attack and stroke.his 10 year cardiovascular disease risk score (Which is the % risk of having a cardiac event or stroke in the next 10 years) is  16.3%. A score >7.5% means he should be treated with a statin drug.         I will increase his pravastatin  to 80 mg nightly. Additional treatment recommendations low fat diet-less cheese/butter, fried food, fast food, increase intake of green/salads/fiber/oatmeal for breakfast, an exercise.  I will order/Offer Coronary (CT Heart) Calcium Score.  This is a dedicated CT scan of the coronary arteries which assess calcium in the artery walls which is used to assess risk of developing coronary artery disease. Patient should call their health insurance to see if this scan is covered.  Please provide telephone number so patient can schedule the CT scan.     Patient has 2 results pending if he does not hear from us  that means they were normal.

## 2023-03-03 LAB — HEP C AB RFLX HCV PCR-A
Hepatitis C Virus Ab by CIA Index: 0.08 IV
Hepatitis C Virus Ab by CIA Interpretation: NEGATIVE

## 2023-03-05 NOTE — Telephone Encounter (Signed)
 Willamina from [employer] is requesting a medication list to be faxed over for their records/Nurses.     Fax Number [phone]  Phone Number [phone]    Thank you.

## 2023-03-05 NOTE — Telephone Encounter (Signed)
 Please follow up

## 2023-03-07 NOTE — Telephone Encounter (Signed)
 LVM with patient's daughter asking her to please call our office.

## 2023-03-07 NOTE — Telephone Encounter (Signed)
-----   Message from Dr. Rosezella Rumpf, MD sent at 03/07/2023  1:25 PM EDT -----  Regarding: Inhaler  Please find out from patient whether he was able to get Advair.  If it was not filled we will likely have to switch it out to a different brand.

## 2023-03-26 ENCOUNTER — Ambulatory Visit: Admit: 2023-03-26 | Discharge: 2023-03-26 | Payer: MEDICARE | Attending: Internal Medicine | Primary: Internal Medicine

## 2023-03-26 DIAGNOSIS — R053 Chronic cough: Secondary | ICD-10-CM

## 2023-03-26 MED ORDER — FLUTICASONE-SALMETEROL 250-50 MCG/ACT IN AEPB
250-50 MCG/ACT | Freq: Two times a day (BID) | RESPIRATORY_TRACT | 3 refills | Status: DC
Start: 2023-03-26 — End: 2023-09-24

## 2023-03-26 NOTE — Consults (Signed)
Session ID: [id number]  Language: Nepali  Interpreter ID: [phone]  Interpreter Name: Danelle Earthly

## 2023-03-26 NOTE — Progress Notes (Signed)
Michael Knight    Date of Birth: 1956/03/18     Date of Service:  03/26/2023     Chief Complaint   Patient presents with    Cough    Latent TB       HPI patient has been accompanied by his son to our office today.  Nepali interpreter was utilized for the encounter.  Patient was last seen in our office in August 2022-history of chronic cough and cough syncope.  States that overall his cough is improved and has responded well to inhaler therapy.  Had been using Advair 250-ran out of inhaler, currently only using albuterol/ProAir as needed.  Denies any significant shortness of breath and wheezing.  States that he has chest discomfort when he drinks cold water.    No Known Allergies  Outpatient Medications Marked as Taking for the 03/26/23 encounter (Office Visit) with Dulce Sellar, MD   Medication Sig Dispense Refill    fluticasone-salmeterol (ADVAIR DISKUS) 250-50 MCG/ACT AEPB diskus inhaler Inhale 1 puff into the lungs in the morning and 1 puff in the evening. 60 each 3    levothyroxine (SYNTHROID) 125 MCG tablet Take 0.5 tablets by mouth Daily 15 tablet 5    pravastatin (PRAVACHOL) 80 MG tablet Take 1 tablet by mouth nightly 30 tablet 5    fluticasone-salmeterol (ADVAIR DISKUS) 250-50 MCG/ACT AEPB diskus inhaler Inhale 1 puff into the lungs in the morning and 1 puff in the evening. 60 each 3    meclizine (ANTIVERT) 12.5 MG tablet Take 1 tablet by mouth 3 times daily as needed for Dizziness 90 tablet 3    meloxicam (MOBIC) 7.5 MG tablet Take 1 tablet by mouth 2 times daily 30 tablet 3    amLODIPine (NORVASC) 10 MG tablet Take 1 tablet by mouth daily 30 tablet 11    pantoprazole (PROTONIX) 40 MG tablet Take 1 tablet by mouth every morning (before breakfast) 90 tablet 1    traZODone (DESYREL) 50 MG tablet Take 1 tablet by mouth nightly 1 and 1/2 tabs 45 tablet 5    propranolol (INDERAL) 40 MG tablet Take 1 tablet by mouth daily (with breakfast) 30 tablet 5    albuterol sulfate HFA (VENTOLIN HFA) 108 (90 Base)  MCG/ACT inhaler Inhale 2 puffs into the lungs 4 times daily as needed for Wheezing 54 g 1    topiramate (TOPAMAX) 100 MG tablet Take 1 tablet by mouth nightly 60 tablet 5    vitamin D (ERGOCALCIFEROL) 1.25 MG (50000 UT) CAPS capsule Take 1 capsule by mouth Once a week at 5 PM 12 capsule 1    Ubrogepant (UBRELVY) 100 MG TABS Take 100 mg by mouth daily as needed (migraine headaches) 16 tablet 5    magnesium gluconate (MAGONATE) 500 MG tablet Take 1 tablet by mouth 2 times daily 60 tablet 5       Immunization History   Administered Date(s) Administered    COVID-19, PFIZER PURPLE top, DILUTE for use, (age 73 y+), 57mcg/0.3mL 08/25/2019, 09/15/2019, 06/09/2020    Influenza, FLUAD, (age 33 y+), IM, Trivalent PF, 0.73mL 04/13/2016    Influenza, FLUBLOK, (age 74 y+), Quadv PF, 0.62mL 05/09/2019       Past Medical History:   Diagnosis Date    Asthma     Back pain     Dyslipidemia     GERD (gastroesophageal reflux disease)     Hyperlipidemia     Hypertension     Hypothyroidism      Past Surgical History:  Procedure Laterality Date    BRONCHOSCOPY N/A 10/28/2020    BRONCHOSCOPY ALVEOLAR LAVAGE performed by Dulce Sellar, MD at [facility] END[facility]    ELBOW SURGERY Right     HAND SURGERY      UPPER GASTROINTESTINAL ENDOSCOPY N/A 02/08/2018    EGD BIOPSY performed by Kandice Robinsons, MD at [facility]     No family history on file.    Review of Systems:  Review of Systems   Constitutional:  Negative for activity change, appetite change, fatigue, fever and unexpected weight change.   HENT:  Negative for congestion, ear discharge, ear pain, postnasal drip, rhinorrhea, sinus pressure, sneezing, sore throat, tinnitus and voice change.    Respiratory:  Positive for cough. Negative for apnea, choking, chest tightness, shortness of breath, wheezing and stridor.    Cardiovascular:  Negative for chest pain, palpitations and leg swelling.   Gastrointestinal:  Negative for abdominal distention, abdominal pain, blood in  stool, constipation, diarrhea and vomiting.   Musculoskeletal:  Negative for arthralgias, back pain and gait problem.   Skin:  Negative for color change, pallor and rash.   Allergic/Immunologic: Negative for environmental allergies.   Neurological:  Negative for dizziness, tremors, seizures, syncope, speech difficulty, weakness, light-headedness, numbness and headaches.   Hematological:  Negative for adenopathy. Does not bruise/bleed easily.   Psychiatric/Behavioral:  Negative for sleep disturbance.        Vitals:    03/26/23 1044   BP: 128/76   Site: Right Upper Arm   Position: Sitting   Cuff Size: Medium Adult   Pulse: 67   SpO2: 96%   Weight: 66.6 kg (146 lb 12.8 oz)   Height: 1.575 m (5\' 2" )          No data to display               Body mass index is 26.85 kg/m.     Wt Readings from Last 3 Encounters:   03/26/23 66.6 kg (146 lb 12.8 oz)   03/01/23 66.7 kg (147 lb)   02/22/23 68.9 kg (151 lb 12.8 oz)     BP Readings from Last 3 Encounters:   03/26/23 128/76   03/01/23 128/82   02/22/23 122/64         Physical Exam  Constitutional:       General: He is not in acute distress.     Appearance: He is well-developed. He is not ill-appearing or diaphoretic.   HENT:      Nose: No congestion or rhinorrhea.      Mouth/Throat:      Pharynx: No oropharyngeal exudate or posterior oropharyngeal erythema.   Cardiovascular:      Rate and Rhythm: Normal rate and regular rhythm.      Heart sounds: Normal heart sounds. No murmur heard.     No friction rub.   Pulmonary:      Effort: No respiratory distress.      Breath sounds: Normal breath sounds. No wheezing, rhonchi or rales.   Chest:      Chest wall: No tenderness.   Abdominal:      General: There is no distension.      Palpations: There is no mass.      Tenderness: There is no abdominal tenderness. There is no guarding or rebound.   Musculoskeletal:         General: No swelling, tenderness or deformity.   Lymphadenopathy:      Cervical: No cervical adenopathy.  Skin:      Coloration: Skin is not pale.      Findings: No erythema, lesion or rash.   Neurological:      Mental Status: He is alert and oriented to person, place, and time. Mental status is at baseline.      Motor: No abnormal muscle tone.         Health Maintenance   Topic Date Due    DTaP/Tdap/Td vaccine (1 - Tdap) Never done    Colorectal Cancer Screen  Never done    Shingles vaccine (1 of 2) Never done    Respiratory Syncytial Virus (RSV) Pregnant or age 64 yrs+ (1 - 1-dose 60+ series) Never done    Pneumococcal 65+ years Vaccine (1 of 1 - PCV) Never done    AAA screen  06/12/2021    Flu vaccine (1) 01/11/2023    COVID-19 Vaccine (4 - 2023-24 season) 02/11/2023    A1C test (Diabetic or Prediabetic)  02/22/2024    Lipids  02/29/2024    Depression Screen  02/29/2024    Annual Wellness Visit (Medicare)  03/01/2024    Hepatitis C screen  Completed    Hepatitis A vaccine  Aged Out    Hepatitis B vaccine  Aged Out    Hib vaccine  Aged Out    Polio vaccine  Aged Out    Meningococcal (ACWY) vaccine  Aged Out    Diabetes screen  Discontinued    HIV screen  Discontinued    Prostate Specific Antigen (PSA) Screening or Monitoring  Discontinued        Assessment/Plan:    Cough is improved now that he has been taken off the ACE inhibitor.  He will continue with Advair 250, 1 puff twice daily since he has noted benefit with the inhaler.  Also use albuterol inhaler as needed.  At next visit we can trial taking him off Advair completely, if no significant cough noted.    CT chest from 2022 showed chronic atelectatic/fibrotic changes and bronchoscopy from May 2022 showed negative cultures including AFB.  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  No indication for repeat imaging-only consider if symptoms of cough recurs.    Return in about 6 months (around 09/24/2023).

## 2023-03-28 ENCOUNTER — Encounter

## 2023-03-28 NOTE — Telephone Encounter (Signed)
Selmer's son called back and would like a fit test mailed to the address on file. I will mail one out.

## 2023-03-28 NOTE — Telephone Encounter (Signed)
-----   Message from Dr. Rosezella Rumpf, MD sent at 03/27/2023  1:03 PM EDT -----  Regarding: Screening   Please let patient know that this is  a gentle reminder that they are due for Colon cancer screening they  could pick up a FIT test at the office or if they  prefers it  could be mailed to their residence. (Please mail FIT test if they request it.)  We are hoping on receiving their FIT test soon, they could drop off their sample at the office the day after receiving the FIT test.  If patient prefers to get a Cologuard test instead I can put the  order in; or if they prefer to see a gastroenterologist to have colonoscopy done, please let us know and I will put in the referral.

## 2023-03-28 NOTE — Telephone Encounter (Signed)
LM with Donley to   let patient know that this is  a gentle reminder that they are due for Colon cancer screening they  could pick up a FIT test at the office or if they  prefers it  could be mailed to their residence. (Please mail FIT test if they request it.)  We are hoping on receiving their FIT test soon, they could drop off their sample at the office the day after receiving the FIT test.  If patient prefers to get a Cologuard test instead I can put the  order in; or if they prefer to see a gastroenterologist to have colonoscopy done, please let us know and I will put in the referral.

## 2023-04-02 ENCOUNTER — Encounter: Admit: 2023-04-02 | Discharge: 2023-04-02 | Payer: MEDICARE | Primary: Internal Medicine

## 2023-04-02 DIAGNOSIS — Z1211 Encounter for screening for malignant neoplasm of colon: Secondary | ICD-10-CM

## 2023-04-02 LAB — POCT FECAL IMMUNOCHEMICAL TEST (FIT)
Control: REACTIVE
Fecal Blood Immunochemical Test: NONREACTIVE

## 2023-04-02 NOTE — Other (Signed)
Please go ahead give patient their result.

## 2023-05-17 ENCOUNTER — Encounter: Admit: 2023-05-17 | Payer: MEDICARE | Admitting: Neurology | Primary: Internal Medicine

## 2023-05-17 VITALS — BP 134/86 | HR 57 | Ht 62.01 in | Wt 146.0 lb

## 2023-05-17 DIAGNOSIS — G43119 Migraine with aura, intractable, without status migrainosus: Secondary | ICD-10-CM

## 2023-05-17 MED ORDER — NURTEC 75 MG PO TBDP
75 | ORAL_TABLET | ORAL | 1 refills | Status: AC
Start: 2023-05-17 — End: 2023-08-15

## 2023-05-17 NOTE — Consults (Signed)
 Session ID: [id number]  Language: Nepali  Interpreter ID: [phone]  Interpreter Name: Enrigue Catena

## 2023-05-17 NOTE — Progress Notes (Signed)
 The patient is a 67 y.o. years old male who  was referred by Leonia Corona, MD for consultation regarding chronic migraines.     HPI:  Patient describes history of migraines for years.  Frequency 2-3 a week.  Duration is hours and Degree is severe.  Des

## 2023-05-17 NOTE — Patient Instructions (Addendum)
 YOU MUST CONFIRM YOUR APPOINTMENT 1 DAY PRIOR OR IT WILL BE CANCELLED!!   Our office will call you 3 times the day prior to your appointment in an attempt to confirm.  Please return our call ASAP or confirm your appt through MyChart no later than 3 pm the

## 2023-05-28 ENCOUNTER — Encounter: Admit: 2023-05-28 | Admitting: Internal Medicine

## 2023-05-28 DIAGNOSIS — G4700 Insomnia, unspecified: Secondary | ICD-10-CM

## 2023-05-28 MED ORDER — TRAZODONE HCL 50 MG PO TABS
50 | ORAL_TABLET | Freq: Every evening | ORAL | 5 refills | Status: DC
Start: 2023-05-28 — End: 2024-01-04

## 2023-06-26 NOTE — Telephone Encounter (Signed)
Melo son called and he states that [employer] needs a statement from Dr. Dan Humphreys stating that Michael Knight can not work due to his sickness. Please call to advise.

## 2023-06-26 NOTE — Telephone Encounter (Signed)
Please follow up

## 2023-07-02 NOTE — Telephone Encounter (Signed)
LVM for patient's child Menna. I asked them to call the office and let us know what sickness the patient needed a note for. I also asked that if [facility] [employer] [facility] needs [place] to fill out and sign any paperwork to just fax it to [place].

## 2023-07-05 NOTE — Telephone Encounter (Signed)
Michael Knight, Cainen's son called to say that there's no form to fill out. He needs a letter from Dr. Dan Humphreys  so that he can  take to [facility] [employer] stating his father's conditions of High BP,Migraines and his asthma being the reason why his father can't work , so that his father can receive his benefits. Please call [name] at [phone].

## 2023-07-05 NOTE — Telephone Encounter (Signed)
Tried to reach out to the patient's son but his phone just rang and rang. Dr. Dan Humphreys is not going to write a letter for the patient. Dr. Dan Humphreys would like the patient to contact his neurologist and pulmonologist for the letter.

## 2023-07-10 ENCOUNTER — Ambulatory Visit: Admit: 2023-07-10 | Discharge: 2023-07-10 | Payer: MEDICARE | Attending: Internal Medicine | Primary: Internal Medicine

## 2023-07-10 VITALS — BP 128/72 | HR 55 | Temp 97.70000°F | Resp 18 | Ht 62.01 in | Wt 151.0 lb

## 2023-07-10 DIAGNOSIS — M79671 Pain in right foot: Secondary | ICD-10-CM

## 2023-07-10 NOTE — Progress Notes (Signed)
Michael Knight is 68 y.o. male who p/w Right foot    Patient complaining of acute on chronic right foot pain.  He said in the past during winter he has had significant pain right lower extremity he has been sent to physical therapy which has helped somewhat.  He has been complaining of worsening right heel and ankle pain which is limiting his ability to move around.  Denies redness of skin overlying the joint denies swelling, no fever/chills.  Patient will like to know what the next step is and treatment options are.     The problem and medicine lists and chart were reviewed in detail.    Review of Systems   Constitutional:  Negative for activity change, appetite change, chills, fatigue, fever and unexpected weight change.   HENT:  Negative for congestion, ear pain, postnasal drip, sinus pressure, sore throat, tinnitus and trouble swallowing.    Eyes:  Negative for pain and redness.   Respiratory:  Negative for cough, chest tightness, shortness of breath and wheezing.    Cardiovascular:  Negative for chest pain, palpitations and leg swelling.   Gastrointestinal:  Negative for abdominal distention, abdominal pain, blood in stool, constipation, diarrhea, nausea and vomiting.   Endocrine: Negative for cold intolerance, heat intolerance and polydipsia.   Genitourinary:  Negative for decreased urine volume, dysuria, flank pain, frequency, hematuria and urgency.   Musculoskeletal:  Positive for arthralgias. Negative for back pain, joint swelling, neck pain and neck stiffness.        Positive for foot pain   Skin:  Negative for color change and rash.   Neurological:  Negative for dizziness, weakness, numbness and headaches.   Hematological:  Negative for adenopathy.   Psychiatric/Behavioral:  Negative for behavioral problems, sleep disturbance and suicidal ideas. The patient is not nervous/anxious.         BP 128/72 (Site: Left Upper Arm, Position: Sitting, Cuff Size: Medium Adult)   Pulse 55   Temp 97.7 F (36.5 C)  (Infrared)   Resp 18   Ht 1.575 m (5' 2.01")   Wt 68.5 kg (151 lb)   SpO2 98%   BMI 27.61 kg/m    Physical Exam  Constitutional:       General: He is not in acute distress.     Appearance: Normal appearance. He is not ill-appearing.   HENT:      Head: Normocephalic and atraumatic.      Right Ear: Ear canal normal.      Left Ear: Ear canal normal.      Mouth/Throat:      Mouth: Mucous membranes are moist.      Pharynx: No oropharyngeal exudate or posterior oropharyngeal erythema.   Eyes:      Extraocular Movements: Extraocular movements intact.      Conjunctiva/sclera: Conjunctivae normal.      Pupils: Pupils are equal, round, and reactive to light.   Neck:      Vascular: No carotid bruit.   Cardiovascular:      Rate and Rhythm: Normal rate and regular rhythm.      Pulses: Normal pulses.      Heart sounds: Normal heart sounds. No murmur heard.     No gallop.   Pulmonary:      Effort: Pulmonary effort is normal.      Breath sounds: Normal breath sounds. No wheezing, rhonchi or rales.   Abdominal:      General: Abdomen is flat. There is no distension.   Musculoskeletal:  General: No swelling or tenderness. Normal range of motion.      Cervical back: No tenderness.   Lymphadenopathy:      Cervical: No cervical adenopathy.   Skin:     Findings: No erythema, lesion or rash.   Neurological:      General: No focal deficit present.      Mental Status: He is alert and oriented to person, place, and time. Mental status is at baseline.      Cranial Nerves: No cranial nerve deficit.      Motor: No weakness.   Psychiatric:         Mood and Affect: Mood normal.         Behavior: Behavior normal.         Thought Content: Thought content normal.         Judgment: Judgment normal.          ASSESSMENT/PLAN:  1. Right foot pain  Assessment & Plan:    patient will be referred to podiatry for further assessment  Orders:  -     Non [facility] - External Referral To Podiatry  2. Difficulty walking  Assessment & Plan:     Orders:  -      Non [facility] - External Referral To Podiatry      Return in about 3 months (around 10/08/2023) for Routine follow-up, 15-minutes .     This note was generated in part or in whole with voice recognition software.  Voice recognition is usually quite accurate but there are transcription errors that can often occur.  All attempts were made to correct these errors I apologized for any  typographical errors that were not detected and corrected.     Electronically signed by Montine Circle. Dan Humphreys, MD on 07/10/2023 at 3:26 PM.

## 2023-07-10 NOTE — Consults (Signed)
Session ID: [id number]  Language: Nepali  Interpreter ID: [phone]  Interpreter Name: Dennard Nip

## 2023-07-10 NOTE — Progress Notes (Signed)
[id number]   Michael Knight   #[id number]

## 2023-07-10 NOTE — Assessment & Plan Note (Signed)
patient will be referred to podiatry for further assessment.

## 2023-07-19 NOTE — Telephone Encounter (Signed)
Cynthia's son Roselyn Reef called needing a letter from Dr. Dan Humphreys to [employer] that due to Michael Knight's medical condition he can not work. Please call to advise.

## 2023-07-19 NOTE — Telephone Encounter (Signed)
Please follow-up, you will need to get details.

## 2023-07-19 NOTE — Telephone Encounter (Signed)
 Please follow-up, you will need to get details.

## 2023-07-20 ENCOUNTER — Inpatient Hospital Stay
Admit: 2023-07-20 | Discharge: 2023-08-02 | Payer: Medicare (Managed Care) | Attending: Student in an Organized Health Care Education/Training Program

## 2023-07-20 ENCOUNTER — Ambulatory Visit
Admit: 2023-07-20 | Discharge: 2023-07-20 | Payer: Medicare (Managed Care) | Attending: Student in an Organized Health Care Education/Training Program

## 2023-07-20 DIAGNOSIS — M79671 Pain in right foot: Secondary | ICD-10-CM

## 2023-07-20 MED ORDER — methylPREDNISolone (MEDROL, PAK,) 4 mg tablet
4 | ORAL | 0 refills | Status: AC
Start: 2023-07-20 — End: ?

## 2023-07-20 NOTE — Progress Notes (Signed)
 Podiatric Surgery Clinic Note    07/20/2023              10:30 AM  Patient:Shawn Mann    Chief Complaint: Right Foot pain    History of Present Illness:   Patient presents to clinic with complaints of right posterior throbbing heel pain and leg cramping. Patient reports pain has been ongoing for 5-6 years and most painful at night and after periods of rest. States he recently moved to [place]  from [place]  6 months ago. Reports outside provider prior to moving prescribed him some nerve medication. Reports pain has not improved and it is impacting quality of life. Does not recall any trauma. Denies SOB and chest pain. Denies consitutional symptoms.     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    Neurological:  Gross and epicritic sensation intact   Protective sensation intact as measured with 5.07g semmes-weinstein monofilament.  Negative tinels/vallieux upon neural light percussion.    Vascular:  Palpable DP/PT   CFT brisk   no edema .    Orthopedic:  +5/5 all lower extremity muscles strength   Tenderness to insertion of achilles tendon.   Pain with dorsiflexion of ankle.  Decreased ROM of ankle without crepitus. Positive silfverskiold test.        Pertinent Ancillary Tests:    Radiographs 3 views right foot dated 07/20/2023 independently reviewed show no acute fracture, dislocation, or other osseous findings.  Posterior heel spur.     Assessment:  1. Right foot pain    2. Achilles tendonosis    3. Tendonitis, Achilles, right          Treatment:  Evaluation and Management with greater than 50% of time spent with patient dedicated to discussion of pathogenesis and treatment options for patient's problems including home stretching exercises and physical therapy. Possible advanced imaging if symptoms do not improve.    Radiographs reviewed as above.  Rx Physical therapy.  Rx Medrol  dose pack   WBAT  RICE, NSAIDS as needed.   Reappoint 6-8 weeks or sooner if necessary.       Number and Complexity of Problems (Level 4)  During this encounter, I addressed 1 or more chronic illnesses with exacerbation, progression, or side effects of treatment      Amount and/or Complexity of Data Ordered, Reviewed, or Analyzed (Level 2)  I reviewed 1 test(s) including:XR.      Risk of Complication and/or Morbidity or Mortality of Patient Management (Level  4)  The patient's management entails Moderate risk of complications and/or morbidity or mortality.    Overall LOS:  4      Ozell Hero, DPM  PGY-2- Foot and Ankle Surgery

## 2023-07-23 NOTE — Telephone Encounter (Signed)
 Amy from [facility] called and asked if a facesheet and the patients PT order can be faxed over to fax: [phone].  Please advise

## 2023-07-24 DIAGNOSIS — M79671 Pain in right foot: Principal | ICD-10-CM

## 2023-07-31 NOTE — Telephone Encounter (Signed)
Patient was seen by Dr. Dan Humphreys on 07/10/23 and was told at that visit that Dr. Dan Humphreys would not be writing a letter for the patient. Dr. Dan Humphreys would like the patient to get the letter from his specialty doctors because Dr. Dan Humphreys is unsure of the severity of his conditions.        07/05/23  3:09 PM  Note     Tried to reach out to the patient's son but his phone just rang and rang. Dr. Dan Humphreys is not going to write a letter for the patient. Dr. Dan Humphreys would like the patient to contact his neurologist and pulmonologist for the letter.

## 2023-08-01 NOTE — Telephone Encounter (Signed)
Michael Knight, Michael Knight 's son is still needing a letter from Dr. Dan Humphreys with all the diagnose of her father to give to [employer] for the reason why Michael Knight can not work. I read the messages regarding him talking with the neurologist and pulmonologist to wright the letters and they will not. Please call to advise.Marland Kitchen

## 2023-08-08 NOTE — Telephone Encounter (Signed)
 Please prepare letter which lists patient's chronic diagnoses then I will sign.

## 2023-08-13 ENCOUNTER — Encounter

## 2023-08-14 MED ORDER — MAGNESIUM GLUCONATE 500 MG PO TABS
500 | ORAL_TABLET | Freq: Two times a day (BID) | ORAL | 5 refills | Status: DC
Start: 2023-08-14 — End: 2024-03-19

## 2023-08-23 ENCOUNTER — Encounter

## 2023-08-23 MED ORDER — PRAVASTATIN SODIUM 80 MG PO TABS
80 | ORAL_TABLET | Freq: Every evening | ORAL | 11 refills | Status: DC
Start: 2023-08-23 — End: 2024-01-14

## 2023-08-23 MED ORDER — PROPRANOLOL HCL 40 MG PO TABS
40 | ORAL_TABLET | Freq: Every day | ORAL | 11 refills | Status: AC
Start: 2023-08-23 — End: ?

## 2023-08-23 MED ORDER — ALBUTEROL SULFATE HFA 108 (90 BASE) MCG/ACT IN AERS
108 (90 Base) MCG/ACT | Freq: Four times a day (QID) | RESPIRATORY_TRACT | 11 refills | Status: DC | PRN
Start: 2023-08-23 — End: 2023-09-24

## 2023-08-23 NOTE — Telephone Encounter (Signed)
 Medication:   Requested Prescriptions     Pending Prescriptions Disp Refills    albuterol sulfate HFA (PROVENTIL;VENTOLIN;PROAIR) 108 (90 Base) MCG/ACT inhaler [Pharmacy Med Name: ALBUTEROL HFA (VENTOLIN) INH] 54 each 1     Sig: INHALE 2 PUFFS INTO THE LUNGS 4 TIMES DAILY AS NEEDED FOR WHEEZING.    propranolol (INDERAL) 40 MG tablet [Pharmacy Med Name: PROPRANOLOL 40 MG TABLET] 30 tablet 5     Sig: TAKE 1 TABLET BY MOUTH EVERY DAY WITH BREAKFAST    pravastatin (PRAVACHOL) 80 MG tablet [Pharmacy Med Name: PRAVASTATIN SODIUM 80 MG TAB] 30 tablet 5     Sig: TAKE 1 TABLET BY MOUTH EVERY DAY AT NIGHT      Last Filled:  03/02/23    Patient Phone Number: [phone] (home) [phone] (work)    Last appt: 07/10/2023   Next appt: 10/08/2023    Last OARRS:        No data to display              PDMP Monitoring:    Last PDMP Loraine Leriche as Reviewed [facility]):  Review User Review Instant Review Result          Preferred Pharmacy:   [facility] [phone] [place], [place] - [address] - P [phone] Carmon Ginsberg [phone]  [address]  [place] [address]  Phone: [phone] Fax: [phone]    Recent Visits  Date Type Provider Dept   07/10/23 Office Visit Leonia Corona, MD [facility]   03/01/23 Office Visit Leonia Corona, MD [facility]   02/22/23 Office Visit Leonia Corona, MD [facility]   Showing recent visits within past 540 days with a meds authorizing provider and meeting all other requirements  Future Appointments  Date Type Provider Dept   10/08/23 Appointment Leonia Corona, MD [facility]   Showing future appointments within next 150 days with a meds authorizing provider and meeting all other requirements     07/10/2023

## 2023-08-27 ENCOUNTER — Telehealth

## 2023-08-27 MED ORDER — UBRELVY 100 MG PO TABS
100 | ORAL_TABLET | Freq: Every day | ORAL | 5 refills | Status: DC | PRN
Start: 2023-08-27 — End: 2024-01-04

## 2023-08-27 NOTE — Telephone Encounter (Signed)
 Patient calling with the insurance on the phone to get assistance on the below medication. Insurance states the medication will require an authorization and would like to get the process started.     Ubrogepant (UBRELVY) 100 MG TABS [[id number]]      Thank you.

## 2023-09-10 NOTE — Telephone Encounter (Signed)
 Submitted PA for Ubrelvy 100MG  tablets   [facility]ia [facility] Key: [id number]  STATUS: PENDING.    Follow up done daily; if no decision with in three days we will refax.  If another three days goes by with no decision will call the insurance for status.

## 2023-09-12 NOTE — Telephone Encounter (Signed)
 Approved on March 31 by [employer] 2017 NCPDP  Request Reference Number: [id number]. UBRELVY TAB 100MG  is approved through 06/11/2024. Your patient may now fill this prescription and it will be covered.  Effective Date: 09/10/2023  Authorization Expiration Date: 06/11/2024

## 2023-09-12 NOTE — Telephone Encounter (Signed)
 Please let patient know Bernita Raisin was approved he could pick it up from his pharmacy.

## 2023-09-12 NOTE — Telephone Encounter (Signed)
-----   Message from Dr. Rosezella Rumpf, MD sent at 09/11/2023  4:58 PM EDT -----  Regarding: f/u  Please switch 10/08/23 visit to [facility] AWV

## 2023-09-12 NOTE — Telephone Encounter (Signed)
Changed to AWV.

## 2023-09-13 NOTE — Discharge Summary (Addendum)
 Discharge Summary   Name: Michael Knight    MRN: [id number] CSN: [id number]   Admitted: 09/11/2023 Discharged: 09/13/2023     Dear Primary Care Provider/Dwayne Orlando Myrrol Dan Humphreys, MD,     It was my pleasure to provide care for your patient, Michael Knight who was admitted to the hospital and is now being discharged. The summary of the patient's hospital course can be found below. Please do not hesitate to contact me with any concerns or if you require additional information. Thank you for allowing me to participate in the care of your patient.     Sincerely,     Samip J. Ramiro Harvest, MD, MPH, FACP  [facility]  [address]  [place], [place] [address]  Phone: [phone]  Email: [email or web address]      Admission Diagnoses:  Chest pain [R07.9]      Discharge Diagnoses:  Chest pain in adult-suspect musculoskeletal-costochondritis based on history and exam     Sinus bradycardia     Essential hypertension     Acquired hypothyroidism              -- TSH significantly low and free T4 elevated-indicating hyperthyroid state    Discharge instructions:      Follow Up:    [facility], [place]  [address]  [place] [address]  [phone]    Follow up in 1 week(s)  recommend repeating thyroid function test in about 3-4 weeks          Hospital Course:   Michael Knight is a 69 y/o male with known history of essential hypertension, hypothyroidism on Synthroid who got admitted for evaluation of left-sided chest pain.  Serial troponins were negative.  EKG showed sinus bradycardia and T wave abnormality in lateral leads.  He underwent nuclear stress test which did not reveal ischemia.  His chest pain was reproducible on palpation and pain improved with ibuprofen.  His D-dimer was negative but due to persistent pain, I also ordered CTA chest PE protocol which did not reveal PE.  This morning he reported feeling better and noted significant improvement in pain.  He will be discharged home today.  I advised him  to follow-up with his primary care physician.    Of note, I have decreased dose of his Synthroid from 75 mcg to 25 mcg due to significantly elevated free T4 level.  I have also decreased dose of propranolol from 40 mg 3 times daily to 20 mg twice a day due to bradycardia.    I advised him to take over-the-counter ibuprofen 2-3 times a day for a short period for what it appears to be musculoskeletal chest pain      Discharge Examination:  Date of Exam: 09/13/2023    Vitals:    09/12/23 2106 09/13/23 0000 09/13/23 0523 09/13/23 0812   BP:  (!) 102/59 104/60 115/77   Pulse:  (!) 57 (!) 57 (!) 57   Resp: 18 16 16 18    Temp:  98.1 F (36.7 C) 97.5 F (36.4 C) 97.5 F (36.4 C)   SpO2:  94% 96% 94%   Weight:   150 lb 4.8 oz (68.2 kg)    Height:             General: no acute distress  HEENT: Normocephalic, extraocular movement intact  RS: Nonlabored respiration, no wheezing  CVS: Regular rhythm, bradycardia with heart rate 56, no rub/gallop  Neuro: Awake, alert, following verbal command  Disposition:  Discharge home        Last BMP Results:  Recent Labs   Lab 09/11/23  1250 09/12/23  0427 09/13/23  0431   NA 139 140 138   K 4.1 3.6 3.8   CL 108* 111* 113*   CO2 23 22 19*   BUN 12 13 11    CREATININE 1.14 1.09 1.14   GLU 133* 167* 132*   CALCIUM 9.2 8.4* 8.4*     Lab Results   Component Value Date    MG 1.9 09/13/2023       Last CBC w diff Result:  Recent Labs   Lab 09/13/23  0431   WBC 4.7   HEMOGLOBIN 13.2   HCT 38.8*   PLT 195   RBC 4.38   MCHC 34.1   MCH 30.2   RDW 13.5   NEUTROPHILS 47.8   MONOCYTES 10.0       Last Liver Function Results:  No results for input(s): "ALT", "AST", "BILIDIR", "ALKPHOS" in the last 168 hours.    Invalid input(s): "BILITOTAL"    Last Cardiac Panels:  No results for input(s): "CKMB", "TROPONINI", "MYOGLOBIN" in the last 168 hours.    Invalid input(s): "CKTOTAL", "CKMBINDEX"    CT-ANGIO CHEST PE PROTOCOL [id number]  Result Date: 09/12/2023  IMPRESSION: 1. No acute pulmonary bolus 2. Emphysema  without acute thoracic abnormality 3. Hepatomegaly    Electronically Signed by: Livingston Rigg, MD, 09/12/2023 4:26 PM     NM-MYO PERF SPECT W/EF/WM/MULTIPLE  Result Date: 09/12/2023  IMPRESSION: Normal study without evidence for ischemia or scarring. Left ventricular function is normal. The RV appears unremarkable. Electronically Signed by: Cornelia Dieter, MD, 09/12/2023 2:24 PM     XR-CHEST PORTABLE STAT  Result Date: 09/11/2023  IMPRESSION: No acute cardiopulmonary process.    Electronically Signed by: Belita Bowling, MD, 09/11/2023 12:59 PM         Discharge Medications:  Discharge Medication List as of 09/13/2023 10:16 AM        CONTINUE these medications which have CHANGED    Details   propranoloL (INDERAL) 20 mg tablet Take 1 tablet (20 mg total) by mouth in the morning and 1 tablet (20 mg total) before bedtime.Disp-60 tablet, R-0, Normal      SYNTHROID 25 mcg tablet Take 1 tablet (25 mcg total) by mouth every morning (before breakfast)Disp-30 tablet, R-0, DAW, Normal           CONTINUE these medications which have NOT CHANGED    Details   amLODIPine (NORVASC) 10 mg tablet Take 1 tablet (10 mg total) by mouth dailyHistorical Med      MAGNESIUM GLYCINATE PO Take 500 mg by mouthHistorical Med      pantoprazole (PROTONIX) 40 mg delayed release tablet Take 1 tablet (40 mg total) by mouth dailyHistorical Med      pravastatin (PRAVACHOL) 40 mg tablet Take 2 tablets (80 mg total) by mouth dailyHistorical Med      topiramate (TOPAMAX) 100 mg tablet Take 1 tablet (100 mg total) by mouth in the morning and 1 tablet (100 mg total) before bedtime.Historical Med             Total approximate time spent 33 minutes     Signed: Myrlene Asper, MD, 09/13/2023 11:20 AM

## 2023-09-13 NOTE — Telephone Encounter (Signed)
 LVM int [id number]

## 2023-09-13 NOTE — Progress Notes (Signed)
 AVS reviewed w/ pt & son-in-law at bedside. Discussed OTC pain medications to manage chest pain. No questions or concerns. PIV & tele removed. Medications delivered to bedside. Belongings obtained. Escorted to lobby via WC by NA. Condition stable.

## 2023-09-13 NOTE — Consults (Signed)
 Session ID: [id number]  Language: Nepali  Interpreter ID: #[id number]  Interpreter Name: Dario Ave

## 2023-09-24 ENCOUNTER — Ambulatory Visit
Admit: 2023-09-24 | Discharge: 2023-09-24 | Payer: Medicare (Managed Care) | Attending: Internal Medicine | Primary: Internal Medicine

## 2023-09-24 ENCOUNTER — Inpatient Hospital Stay: Payer: Medicare (Managed Care) | Primary: Internal Medicine

## 2023-09-24 VITALS — BP 124/82 | HR 52 | Ht 62.01 in | Wt 153.0 lb

## 2023-09-24 DIAGNOSIS — R0602 Shortness of breath: Secondary | ICD-10-CM

## 2023-09-24 MED ORDER — FLUTICASONE-SALMETEROL 250-50 MCG/ACT IN AEPB
250-50 | Freq: Two times a day (BID) | RESPIRATORY_TRACT | 5 refills | Status: DC
Start: 2023-09-24 — End: 2024-02-26

## 2023-09-24 MED ORDER — ALBUTEROL SULFATE HFA 108 (90 BASE) MCG/ACT IN AERS
108 | Freq: Four times a day (QID) | RESPIRATORY_TRACT | 11 refills | Status: DC | PRN
Start: 2023-09-24 — End: 2024-02-26

## 2023-09-24 NOTE — Consults (Signed)
 Session ID: [id number]  Language: Nepali  Interpreter ID: [id number]  Interpreter Name: Erna He

## 2023-09-24 NOTE — Progress Notes (Signed)
 Michael Knight    Date of Birth: 06/26/1955     Date of Service:  09/24/2023     Chief Complaint   Patient presents with    6 Month Follow-Up    Chest Pain     Been going on for a while     Shortness of Breath       HPI Nepali interpreter was utilized for the encounter patient was recently hospitalized between 4/1 and 4/3 at [facility] for left chest pain evaluation-thought to be related to musculoskeletal/costochondritis, responded to NSAIDs.  Patient also had nuclear stress test, negative for ischemia.    Still continues to be dyspneic even at rest.  States that he has "swelling of his body" particularly of his extremities and abdomen.  Currently denies chest pain.  No significant cough or phlegm.    No Known Allergies  Outpatient Medications Marked as Taking for the 09/24/23 encounter (Office Visit) with Alexa Hymen, MD   Medication Sig Dispense Refill    albuterol sulfate HFA (PROVENTIL;VENTOLIN;PROAIR) 108 (90 Base) MCG/ACT inhaler Inhale 2 puffs into the lungs 4 times daily as needed for Wheezing 1 each 11    fluticasone-salmeterol (ADVAIR DISKUS) 250-50 MCG/ACT AEPB diskus inhaler Inhale 1 puff into the lungs in the morning and 1 puff in the evening. 60 each 5    Ubrogepant (UBRELVY) 100 MG TABS Take 100 mg by mouth daily as needed (migraine headaches) 16 tablet 5    propranolol (INDERAL) 40 MG tablet TAKE 1 TABLET BY MOUTH EVERY DAY WITH BREAKFAST 30 tablet 11    pravastatin (PRAVACHOL) 80 MG tablet TAKE 1 TABLET BY MOUTH EVERY DAY AT NIGHT 30 tablet 11    magnesium gluconate (MAGONATE) 500 MG tablet TAKE 1 TABLET BY MOUTH TWICE A DAY 60 tablet 5    traZODone (DESYREL) 50 MG tablet Take 1 tablet by mouth nightly 1 and 1/2 tabs 45 tablet 5    levothyroxine (SYNTHROID) 125 MCG tablet Take 0.5 tablets by mouth Daily 15 tablet 5    fluticasone-salmeterol (ADVAIR DISKUS) 250-50 MCG/ACT AEPB diskus inhaler Inhale 1 puff into the lungs in the morning and 1 puff in the evening. 60 each 3    amLODIPine  (NORVASC) 10 MG tablet Take 1 tablet by mouth daily 30 tablet 11    pantoprazole (PROTONIX) 40 MG tablet Take 1 tablet by mouth every morning (before breakfast) 90 tablet 1    topiramate (TOPAMAX) 100 MG tablet Take 1 tablet by mouth nightly 60 tablet 5    vitamin D (ERGOCALCIFEROL) 1.25 MG (50000 UT) CAPS capsule Take 1 capsule by mouth Once a week at 5 PM 12 capsule 1       Immunization History   Administered Date(s) Administered    COVID-19, PFIZER PURPLE top, DILUTE for use, (age 12 y+), 79mcg/0.3mL 08/25/2019, 09/15/2019, 06/09/2020    Influenza, FLUAD, (age 37 y+), IM, Trivalent PF, 0.51mL 04/13/2016    Influenza, FLUBLOK, (age 22 y+), Quadv PF, 0.5mL 05/09/2019       Past Medical History:   Diagnosis Date    Asthma     Back pain     Dyslipidemia     GERD (gastroesophageal reflux disease)     Hyperlipidemia     Hypertension     Hypothyroidism      Past Surgical History:   Procedure Laterality Date    BRONCHOSCOPY N/A 10/28/2020    BRONCHOSCOPY ALVEOLAR LAVAGE performed by Alexa Hymen, MD at [facility]  ELBOW SURGERY Right     HAND SURGERY      UPPER GASTROINTESTINAL ENDOSCOPY N/A 02/08/2018    EGD BIOPSY performed by Kandice Robinsons, MD at [facility]     No family history on file.    Review of Systems:  Review of Systems   Constitutional:  Positive for fatigue. Negative for activity change, appetite change, fever and unexpected weight change.   HENT:  Negative for congestion, ear discharge, ear pain, postnasal drip, rhinorrhea, sinus pressure, sneezing, sore throat, tinnitus and voice change.    Respiratory:  Positive for shortness of breath. Negative for apnea, cough, choking, chest tightness, wheezing and stridor.    Cardiovascular:  Positive for leg swelling. Negative for chest pain and palpitations.   Gastrointestinal:  Negative for abdominal distention, abdominal pain, blood in stool, constipation, diarrhea and vomiting.   Musculoskeletal:  Negative for arthralgias, back pain  and gait problem.   Skin:  Negative for color change, pallor and rash.   Allergic/Immunologic: Negative for environmental allergies.   Neurological:  Negative for dizziness, tremors, seizures, syncope, speech difficulty, weakness, light-headedness, numbness and headaches.   Hematological:  Negative for adenopathy. Does not bruise/bleed easily.   Psychiatric/Behavioral:  Negative for sleep disturbance.        Vitals:    09/24/23 1048   BP: 124/82   BP Site: Right Upper Arm   Patient Position: Sitting   BP Cuff Size: Large Adult   Pulse: 52   SpO2: 97%   Weight: 69.4 kg (153 lb)   Height: 1.575 m (5' 2.01")          No data to display               Body mass index is 27.98 kg/m.     Wt Readings from Last 3 Encounters:   09/24/23 69.4 kg (153 lb)   07/10/23 68.5 kg (151 lb)   05/17/23 66.2 kg (146 lb)     BP Readings from Last 3 Encounters:   09/24/23 124/82   07/10/23 128/72   05/17/23 134/86         Physical Exam  Constitutional:       General: He is not in acute distress.     Appearance: He is well-developed. He is not ill-appearing or diaphoretic.   HENT:      Nose: No congestion or rhinorrhea.      Mouth/Throat:      Pharynx: No oropharyngeal exudate or posterior oropharyngeal erythema.   Cardiovascular:      Rate and Rhythm: Normal rate and regular rhythm.      Heart sounds: Normal heart sounds. No murmur heard.     No friction rub.   Pulmonary:      Effort: No respiratory distress.      Breath sounds: Normal breath sounds. No wheezing, rhonchi or rales.   Chest:      Chest wall: No tenderness.   Abdominal:      General: There is no distension.      Palpations: There is no mass.      Tenderness: There is no abdominal tenderness. There is no guarding or rebound.   Musculoskeletal:         General: No swelling, tenderness or deformity.   Lymphadenopathy:      Cervical: No cervical adenopathy.   Skin:     Coloration: Skin is not pale.      Findings: No erythema, lesion or rash.   Neurological:  Mental Status:  He is alert and oriented to person, place, and time. Mental status is at baseline.      Motor: No abnormal muscle tone.         Health Maintenance   Topic Date Due    DTaP/Tdap/Td vaccine (1 - Tdap) Never done    Shingles vaccine (1 of 2) Never done    Pneumococcal 50+ years Vaccine (1 of 1 - PCV) Never done    AAA screen  06/12/2021    COVID-19 Vaccine (4 - 2024-25 season) 02/11/2023    Annual Wellness Visit (Medicare Advantage)  06/13/2023    Flu vaccine (Season Ended) 01/11/2024    A1C test (Diabetic or Prediabetic)  02/22/2024    Lipids  02/29/2024    Depression Screen  02/29/2024    Colorectal Cancer Screen  04/01/2024    Respiratory Syncytial Virus (RSV) Pregnant or age 55 yrs+ (1 - 1-dose 75+ series) 06/13/2031    Hepatitis C screen  Completed    Hepatitis A vaccine  Aged Out    Hepatitis B vaccine  Aged Out    Hib vaccine  Aged Out    Polio vaccine  Aged Out    Meningococcal (ACWY) vaccine  Aged Out    Meningococcal B vaccine  Aged Out    Diabetes screen  Discontinued    HIV screen  Discontinued    Prostate Specific Antigen (PSA) Screening or Monitoring  Discontinued        Assessment/Plan:     Diagnosis Orders   1. Shortness of breath  EKG 12 Lead    Echo (TTE) complete (PRN contrast/bubble/strain/3D)    Brain Natriuretic Peptide      2. Chronic cough  albuterol sulfate HFA (PROVENTIL;VENTOLIN;PROAIR) 108 (90 Base) MCG/ACT inhaler         History of chronic cough, which actually improved once he discontinued ACE inhibitors.  However he has more shortness of breath than usual.  Recent hospitalization for chest pain evaluation with negative stress test and thought to be musculoskeletal.  Will continue with Advair 250, 1 puff twice daily-states that he ran out of this inhaler approximately 4 months ago.  Also continue to use albuterol inhaler as needed, states that it helps his breathing.  Patient's prior PFT from 2022 was entirely negative, doubt value of repeat PFT study at this time.    CTA chest performed  on 4/2 at [facility], showed no acute PE with evidence of emphysema and hepatomegaly.  No lung nodules or infiltrates reported.  Will obtain images via PACS.    Check for proBNP, twelve-lead EKG and 2D echocardiogram to evaluate for congestive heart failure.    Return in about 3 months (around 12/24/2023).    Addendum: CTA chest from 4/2 was personally reviewed, minimal fibrotic changes/atelectasis at the base with chronic left lingular fibrosis.  No new change noted.

## 2023-09-25 ENCOUNTER — Ambulatory Visit: Admit: 2023-09-25 | Discharge: 2023-09-25 | Payer: MEDICARE | Attending: Internal Medicine | Primary: Internal Medicine

## 2023-09-25 VITALS — BP 126/80 | HR 71 | Temp 98.00000°F | Resp 16 | Ht 63.0 in | Wt 149.0 lb

## 2023-09-25 DIAGNOSIS — R079 Chest pain, unspecified: Secondary | ICD-10-CM

## 2023-09-25 LAB — BRAIN NATRIURETIC PEPTIDE: NT Pro-BNP: 55 pg/mL (ref 0–124)

## 2023-09-25 LAB — EKG 12-LEAD
Atrial Rate: 53 {beats}/min
P Axis: 47 degrees
P-R Interval: 178 ms
Q-T Interval: 446 ms
QRS Duration: 80 ms
QTc Calculation (Bazett): 418 ms
R Axis: -11 degrees
T Axis: 6 degrees
Ventricular Rate: 53 {beats}/min

## 2023-09-25 MED ORDER — DEPEND ADJUSTABLE UNDERWEAR MISC
Freq: Three times a day (TID) | 5 refills | Status: DC
Start: 2023-09-25 — End: 2024-01-22

## 2023-09-25 MED ORDER — CANE MISC
Freq: Every day | 0 refills | Status: AC
Start: 2023-09-25 — End: ?

## 2023-09-25 MED ORDER — LEVOTHYROXINE SODIUM 25 MCG PO TABS
25 | ORAL_TABLET | Freq: Every day | ORAL | 11 refills | Status: DC
Start: 2023-09-25 — End: 2024-01-14

## 2023-09-25 NOTE — Assessment & Plan Note (Signed)
Patient sees pulmonologist was on albuterol and Advair.  Follow-up with specialist

## 2023-09-25 NOTE — Assessment & Plan Note (Signed)
 seen at [facility] ED 09/11/23.  Patient had cardiac rule out including nuclear stress test which was negative for ischemia.  Patient's pulmonologist ordered echo and lab work to rule out CHF.

## 2023-09-25 NOTE — Consults (Signed)
 Session ID: [id number]  Language: Nepali  Interpreter ID: #[id number]  Interpreter Name: Thedford Finland

## 2023-09-25 NOTE — Assessment & Plan Note (Signed)
 stable on levothyroxine 75 mcg

## 2023-09-25 NOTE — Assessment & Plan Note (Signed)
stable on Protonix 40 mg daily.

## 2023-09-25 NOTE — Progress Notes (Signed)
 Michael Knight (1955-09-07) is a 68 y.o. male   Follow-up (Int# [phone])     General health:  This patient presents for check up and refills. The problem and medicine lists and chart were reviewed in detail. The patient has been worked up and treated for this/these condition/s and is compliant with taking the medication without any significant side effects. The patient's condition/s is/are chronic and unchanged and otherwise remains stable. Feels well with minor complaints.    Main Problem Review -chest pain patient was seen at [facility] ED 09/11/23.  Patient had cardiac rule out including nuclear stress test which was negative for ischemia..  Patient still complaining of SOB he saw his pulmonologist yesterday who ordered echocardiogram and lab test to rule out CHF    Other problems review    1. Chronic cough/history of latent TB-patient has a history of the same.  Patient saw his pulmonologist yesterday.  Patient cough is at baseline denies any worsening.    2.  HTN - Patient blood pressure is stable today. Patient denies complaints or symptoms today. Patient mentions he is adherent with treatment. Patient denies chest pain or shortness of breath with exertion or at rest. Adherent to low salt diet.      3.  Hypothyroidism -patient denies any complaints today he is adherent to medication.  No constipation no hair loss or additional weight gain.     4.   Gastroesophageal Reflux Disease - Patient denies complaints or symptoms of reflux, acid/waterbrash.  Patient mentions they are adherent with treatment.  Patient tries to avoid food triggers.      The 10-year ASCVD risk score (Arnett DK, et al., 2019) is: 17%    Values used to calculate the score:      Age: 57 years      Sex: Male      Is Non-Hispanic African American: No      Diabetic: No      Tobacco smoker: No      Systolic Blood Pressure: 126 mmHg      Is BP treated: Yes      HDL Cholesterol: 48 mg/dL      Total Cholesterol: 202 mg/dL     Review of Systems   Constitutional:   Negative for activity change, appetite change, chills, fatigue, fever and unexpected weight change.   HENT:  Negative for congestion, ear pain, postnasal drip, sinus pressure, sore throat, tinnitus and trouble swallowing.    Eyes:  Negative for pain and redness.   Respiratory:  Positive for cough. Negative for chest tightness, shortness of breath and wheezing.    Cardiovascular:  Negative for chest pain, palpitations and leg swelling.   Gastrointestinal:  Negative for abdominal distention, abdominal pain, blood in stool, constipation, diarrhea, nausea and vomiting.   Endocrine: Negative for cold intolerance, heat intolerance and polydipsia.   Genitourinary:  Negative for decreased urine volume, dysuria, flank pain, frequency, hematuria and urgency.   Musculoskeletal:  Negative for arthralgias, back pain, joint swelling, neck pain and neck stiffness.   Skin:  Negative for color change and rash.   Neurological:  Negative for dizziness, weakness, numbness and headaches.   Hematological:  Negative for adenopathy.   Psychiatric/Behavioral:  Negative for behavioral problems, sleep disturbance and suicidal ideas. The patient is not nervous/anxious.         BP 126/80 (BP Site: Right Upper Arm, Patient Position: Sitting, BP Cuff Size: Medium Adult)   Pulse 71   Temp 98 F (36.7 C) (Infrared)  Resp 16   Ht 1.6 m (5\' 3" )   Wt 67.6 kg (149 lb)   SpO2 98%   BMI 26.39 kg/m    Physical Exam  Constitutional:       General: He is not in acute distress.     Appearance: Normal appearance. He is not ill-appearing.   HENT:      Head: Normocephalic and atraumatic.      Right Ear: Ear canal normal.      Left Ear: Ear canal normal.      Mouth/Throat:      Mouth: Mucous membranes are moist.      Pharynx: No oropharyngeal exudate or posterior oropharyngeal erythema.   Eyes:      Extraocular Movements: Extraocular movements intact.      Conjunctiva/sclera: Conjunctivae normal.      Pupils: Pupils are equal, round, and reactive to  light.   Neck:      Vascular: No carotid bruit.   Cardiovascular:      Rate and Rhythm: Normal rate and regular rhythm.      Pulses: Normal pulses.      Heart sounds: Normal heart sounds. No murmur heard.     No gallop.   Pulmonary:      Effort: Pulmonary effort is normal.      Breath sounds: Normal breath sounds. No wheezing, rhonchi or rales.   Abdominal:      General: Abdomen is flat. There is no distension.   Musculoskeletal:         General: No swelling or tenderness. Normal range of motion.      Cervical back: No tenderness.   Lymphadenopathy:      Cervical: No cervical adenopathy.   Skin:     Findings: No erythema, lesion or rash.   Neurological:      General: No focal deficit present.      Mental Status: He is alert and oriented to person, place, and time. Mental status is at baseline.      Cranial Nerves: No cranial nerve deficit.      Motor: No weakness.   Psychiatric:         Mood and Affect: Mood normal.         Behavior: Behavior normal.         Thought Content: Thought content normal.         Judgment: Judgment normal.         ASSESSMENT/PLAN:  1. Chest pain, unspecified type  Assessment & Plan:   seen at [facility] ED 09/11/23.  Patient had cardiac rule out including nuclear stress test which was negative for ischemia.  Patient's pulmonologist ordered echo and lab work to rule out CHF.  2. Chronic cough  Assessment & Plan:    Patient sees pulmonologist was on albuterol and Advair.  Follow-up with specialist  3. Essential hypertension  Assessment & Plan:   Stable  Continue anti-hypertensive medication as documented in your medication list amlodipine 10 mg daily  To verify blood pressure cuff accuracy  To keep outpatient BP log,  counseled on exercise and diet (including DASH diet)  Goal to achieve appropriate BMI  Patient agreed with plan with verbal understanding   4. Acquired hypothyroidism  Assessment & Plan:    stable on levothyroxine 75 mcg       Orders:  -     levothyroxine (SYNTHROID) 25 MCG  tablet; Take 1 tablet by mouth Daily, Disp-30 tablet, R-11Dose reduction, please cancel levothyroxine 75 mcg prescriptionPrint  5. Gastroesophageal reflux disease without esophagitis  Assessment & Plan:    stable on Protonix 40 mg daily  6. Nocturia  -     AFL - Love, Artist Pais., MD, Urology, [place]  7. Mixed incontinence  -     Incontinence Supply Disposable (DEPEND ADJUSTABLE UNDERWEAR) MISC; 1 each by Does not apply route in the morning, at noon, and at bedtime, Disp-90 each, R-5Print  8. Difficulty walking  -     Misc. Devices (CANE) MISC; DAILY Starting Tue 09/25/2023, Disp-1 each, R-0, Print      Given referral and will make appointment to specialist.    Preventative care discussed.  Encouraged healthy diet choices, reg exercise. Patient agreed w/plan and verbal understanding. Patient advised to call or return with any concerns or problems or worsening conditions. Go to the ER for any severe or potentially life threatening problems.    Return in about 2 weeks (around 10/09/2023) for Medicare AWV.     This note was generated in part or in whole with voice recognition software.  Voice recognition is usually quite accurate but there are transcription errors that can often occur.  All attempts were made to correct these errors I apologized for any  typographical errors that were not detected and corrected.     Electronically signed by Montine Circle. Dan Humphreys, MD on 09/25/2023 at 4:25 PM.

## 2023-09-25 NOTE — Assessment & Plan Note (Signed)
Stable  Continue anti-hypertensive medication as documented in your medication list amlodipine 10 mg daily  To verify blood pressure cuff accuracy  To keep outpatient BP log,  counseled on exercise and diet (including DASH diet)  Goal to achieve appropriate BMI  Patient agreed with plan with verbal understanding

## 2023-09-28 ENCOUNTER — Encounter

## 2023-10-01 ENCOUNTER — Inpatient Hospital Stay
Admit: 2023-10-01 | Discharge: 2023-10-08 | Payer: Medicare (Managed Care) | Attending: Internal Medicine | Primary: Internal Medicine

## 2023-10-01 VITALS — Ht 63.0 in | Wt 149.0 lb

## 2023-10-01 DIAGNOSIS — R0602 Shortness of breath: Secondary | ICD-10-CM

## 2023-10-01 LAB — ECHO (TTE) COMPLETE (PRN CONTRAST/BUBBLE/STRAIN/3D)
AV Area by Peak Velocity: 2.7 cm2
AV Area by VTI: 2.6 cm2
AV Mean Gradient: 3 mmHg
AV Mean Velocity: 0.8 m/s
AV Peak Gradient: 5 mmHg
AV Peak Velocity: 1.1 m/s
AV VTI: 26.6 cm
AV Velocity Ratio: 1
AVA/BSA Peak Velocity: 1.6 cm2/m2
AVA/BSA VTI: 1.5 cm2/m2
Body Surface Area: 1.73 m2
E/E' Lateral: 7.57
E/E' Ratio (Averaged): 7.81
E/E' Septal: 8.05
EF BP: 64 % (ref 55–100)
EF Physician: 60 %
Fractional Shortening 2D: 29 % (ref 28–44)
IVSd: 1 cm (ref 0.6–1.0)
LA Area 2C: 11.2 cm2
LA Area 4C: 15.8 cm2
LA Major Axis: 5.1 cm
LA Minor Axis: 4.6 cm
LA Volume BP: 30 mL (ref 18–58)
LA Volume Index BP: 18 mL/m2 (ref 16–34)
LA Volume Index MOD A2C: 13 mL/m2 — AB (ref 16–34)
LA Volume Index MOD A4C: 22 mL/m2 (ref 16–34)
LA Volume MOD A2C: 22 mL (ref 18–58)
LA Volume MOD A4C: 38 mL (ref 18–58)
LV E' Lateral Velocity: 7.4 cm/s
LV E' Septal Velocity: 6.96 cm/s
LV EDV A2C: 41 mL
LV EDV A4C: 59 mL
LV EDV Index A2C: 24 mL/m2
LV EDV Index A4C: 35 mL/m2
LV ESV A2C: 16 mL
LV ESV A4C: 19 mL
LV ESV Index A2C: 9 mL/m2
LV ESV Index A4C: 11 mL/m2
LV Ejection Fraction A2C: 61 %
LV Ejection Fraction A4C: 68 %
LV Mass 2D Index: 91.8 g/m2 (ref 49–115)
LV Mass 2D: 157.1 g (ref 88–224)
LV RWT Ratio: 0.57
LVIDd Index: 2.46 cm/m2
LVIDd: 4.2 cm (ref 4.2–5.9)
LVIDs Index: 1.75 cm/m2
LVIDs: 3 cm
LVOT Area: 2.8 cm2
LVOT Diameter: 1.9 cm
LVOT Mean Gradient: 2 mmHg
LVOT Peak Gradient: 5 mmHg
LVOT Peak Velocity: 1.1 m/s
LVOT SV: 69.7 mL
LVOT Stroke Volume Index: 40.8 mL/m2
LVOT VTI: 24.6 cm
LVOT:AV VTI Index: 0.92
LVPWd: 1.2 cm — AB (ref 0.6–1.0)
MR Peak Gradient: 67 mmHg
MR Peak Velocity: 4.1 m/s
MR VTI: 135 cm
MV A Velocity: 0.65 m/s
MV Area by VTI: 3.1 cm2
MV E Velocity: 0.56 m/s
MV E/A: 0.86
MV Max Velocity: 0.8 m/s
MV Mean Gradient: 1 mmHg
MV Mean Velocity: 0.4 m/s
MV Peak Gradient: 3 mmHg
MV VTI: 22.4 cm
MV:LVOT VTI Index: 0.91
PV Max Velocity: 0.8 m/s
PV Peak Gradient: 2 mmHg
RV Basal Dimension: 2.4 cm
RV Free Wall Peak S': 11.4 cm/s
RV Longitudinal Dimension: 5.4 cm
RV Mid Dimension: 2.1 cm
TAPSE: 1.4 cm — AB (ref 1.7–?)

## 2023-10-01 MED ORDER — MECLIZINE HCL 12.5 MG PO TABS
12.5 | ORAL_TABLET | ORAL | 3 refills | 10.00000 days | Status: DC
Start: 2023-10-01 — End: 2024-03-19

## 2023-10-01 NOTE — Telephone Encounter (Signed)
 Recent Visits  Date Type Provider Dept   09/25/23 Office Visit Caro Christmas, MD [facility]   07/10/23 Office Visit Caro Christmas, MD [facility]   03/01/23 Office Visit Caro Christmas, MD [facility]   02/22/23 Office Visit Caro Christmas, MD [facility]   Showing recent visits within past 540 days with a meds authorizing provider and meeting all other requirements  Future Appointments  Date Type Provider Dept   10/08/23 Appointment Caro Christmas, MD [facility]   Showing future appointments within next 150 days with a meds authorizing provider and meeting all other requirements

## 2023-10-08 ENCOUNTER — Encounter: Payer: MEDICARE | Attending: Internal Medicine | Primary: Internal Medicine

## 2023-10-11 ENCOUNTER — Encounter: Payer: Medicare (Managed Care) | Attending: Internal Medicine | Primary: Internal Medicine

## 2023-10-16 NOTE — Telephone Encounter (Signed)
 Patient's son Chares Commons called, on HIPAA. He said that the [employer] never received the [employer] HCBS paperwork from Dr. Otho Blitz. I saw where the paperwork was scanned into media and Dr. Otho Blitz had signed it. There was no fax number on the paperwork but there was an addess. I put the paperwork in the mail to [employer].

## 2023-11-29 ENCOUNTER — Encounter

## 2023-11-29 NOTE — Telephone Encounter (Signed)
 Medication:   Requested Prescriptions     Pending Prescriptions Disp Refills    traZODone  (DESYREL ) 50 MG tablet 45 tablet 5     Sig: Take 1 tablet by mouth nightly 1 and 1/2 tabs     Last Filled:  05/28/23    Last appt: 09/25/2023   Next appt: 03/03/2024    Last OARRS:        No data to display

## 2023-11-29 NOTE — Telephone Encounter (Signed)
 Please find out which pharmacy I am supposed to send this prescription to

## 2023-11-30 NOTE — Telephone Encounter (Signed)
 LM with Abhinav to find out which pharmacy his script for trazodone  needs to go to.

## 2023-12-25 ENCOUNTER — Ambulatory Visit
Admit: 2023-12-25 | Discharge: 2023-12-25 | Payer: Medicare (Managed Care) | Attending: Internal Medicine | Primary: Internal Medicine

## 2023-12-25 VITALS — BP 128/80 | Temp 98.00000°F | Resp 16 | Ht 63.0 in | Wt 150.0 lb

## 2023-12-25 DIAGNOSIS — R053 Chronic cough: Principal | ICD-10-CM

## 2023-12-25 MED ORDER — PREDNISONE 20 MG PO TABS
20 | ORAL_TABLET | Freq: Every day | ORAL | 0 refills | 7.50000 days | Status: AC
Start: 2023-12-25 — End: 2023-12-30

## 2023-12-25 MED ORDER — CORICIDIN HBP CONGESTION/COUGH 10-200 MG PO CAPS
10-200 | ORAL_CAPSULE | Freq: Four times a day (QID) | ORAL | 0 refills | Status: DC
Start: 2023-12-25 — End: 2024-01-22

## 2023-12-25 MED ORDER — AZITHROMYCIN 250 MG PO TABS
250 | ORAL_TABLET | ORAL | 0 refills | 5.00000 days | Status: AC
Start: 2023-12-25 — End: 2024-01-04

## 2023-12-25 MED ORDER — BENZONATATE 200 MG PO CAPS
200 | ORAL_CAPSULE | Freq: Three times a day (TID) | ORAL | 0 refills | 8.50000 days | Status: DC | PRN
Start: 2023-12-25 — End: 2024-02-26

## 2023-12-25 NOTE — Consults (Signed)
 Session ID: [id number]  Session Duration: 22 minutes  Language: Nepali  Interpreter ID: #[id number]  Interpreter Name: Augustina

## 2023-12-25 NOTE — Progress Notes (Signed)
 Michael Knight is 68 y.o. male with a PMH of latenet tuberculosis who p/w Cough (2-3 days )    Patient has a history of chronic cough-pulmonary emphysema, history of latent TB, said over the last 2-3 days he has been having this constant dry cough without any relief.  Patient has coughing fits which leaves him breathless with wheezing and chest tightness.  He says when he coughs or hard it causes chest pain.  Patient denies fever/chills.    The problem and medicine lists and chart were reviewed in detail.      Review of Systems   Constitutional:  Negative for activity change, appetite change, chills, fatigue, fever and unexpected weight change.   HENT:  Negative for congestion, ear pain, postnasal drip, sinus pressure, sore throat, tinnitus and trouble swallowing.    Eyes:  Negative for pain and redness.   Respiratory:  Positive for chest tightness and wheezing. Negative for cough and shortness of breath.    Cardiovascular:  Negative for chest pain, palpitations and leg swelling.   Gastrointestinal:  Negative for abdominal distention, abdominal pain, blood in stool, constipation, diarrhea, nausea and vomiting.   Endocrine: Negative for cold intolerance, heat intolerance and polydipsia.   Genitourinary:  Negative for decreased urine volume, dysuria, flank pain, frequency, hematuria and urgency.   Musculoskeletal:  Negative for arthralgias, back pain, joint swelling, neck pain and neck stiffness.   Skin:  Negative for color change and rash.   Neurological:  Negative for dizziness, weakness, numbness and headaches.   Hematological:  Negative for adenopathy.   Psychiatric/Behavioral:  Negative for behavioral problems, sleep disturbance and suicidal ideas. The patient is not nervous/anxious.         BP 128/80 (BP Site: Right Upper Arm, Patient Position: Sitting, BP Cuff Size: Medium Adult)   Temp 98 F (36.7 C) (Infrared)   Resp 16   Ht 1.6 m (5' 3)   Wt 68 kg (150 lb)   BMI 26.57 kg/m    Physical  Exam  Constitutional:       General: He is not in acute distress.     Appearance: Normal appearance. He is not ill-appearing.   HENT:      Head: Normocephalic and atraumatic.      Right Ear: Ear canal normal.      Left Ear: Ear canal normal.      Mouth/Throat:      Mouth: Mucous membranes are moist.      Pharynx: No oropharyngeal exudate or posterior oropharyngeal erythema.   Eyes:      Extraocular Movements: Extraocular movements intact.      Conjunctiva/sclera: Conjunctivae normal.      Pupils: Pupils are equal, round, and reactive to light.   Neck:      Vascular: No carotid bruit.   Cardiovascular:      Rate and Rhythm: Normal rate and regular rhythm.      Pulses: Normal pulses.      Heart sounds: Normal heart sounds. No murmur heard.     No gallop.   Pulmonary:      Effort: Pulmonary effort is normal.      Breath sounds: Normal breath sounds. No wheezing, rhonchi or rales.   Abdominal:      General: Abdomen is flat. There is no distension.   Musculoskeletal:         General: No swelling or tenderness. Normal range of motion.      Cervical back: No tenderness.   Lymphadenopathy:  Cervical: No cervical adenopathy.   Skin:     Findings: No erythema, lesion or rash.   Neurological:      General: No focal deficit present.      Mental Status: He is alert and oriented to person, place, and time. Mental status is at baseline.      Cranial Nerves: No cranial nerve deficit.      Motor: No weakness.   Psychiatric:         Mood and Affect: Mood normal.         Behavior: Behavior normal.         Thought Content: Thought content normal.         Judgment: Judgment normal.          ASSESSMENT/PLAN:  1. Chronic cough  Assessment & Plan:  We will get CXR blood work.  Prescribed antibiotic, steroid, cough medication  Patient sees pulmonologist was on albuterol  and Advair .  Follow-up with specialist  Orders:  -     XR CHEST (2 VW); Future  -     CBC with Auto Differential; Future  -     Comprehensive Metabolic Panel; Future  -      Sedimentation Rate; Future  -     benzonatate  (TESSALON ) 200 MG capsule; Take 1 capsule by mouth 3 times daily as needed for Cough, Disp-30 capsule, R-0Normal  -     azithromycin  (ZITHROMAX ) 250 MG tablet; 500mg  on day 1 followed by 250mg  on days 2 - 5, Disp-6 tablet, R-0Normal  -     Dextromethorphan-guaiFENesin  (CORICIDIN HBP CONGESTION/COUGH) 10-200 MG CAPS; Take 1 capsule by mouth in the morning, at noon, in the evening, and at bedtime, Disp-20 capsule, R-0Normal  -     predniSONE  (DELTASONE ) 20 MG tablet; Take 2 tablets by mouth daily for 5 days, Disp-10 tablet, R-0Normal  2. Pulmonary emphysema, unspecified emphysema type [facility])  Assessment & Plan:   We will get CXR blood work.  Prescribed antibiotic, steroid, cough medication  Patient sees pulmonologist was on albuterol  and Advair .  Follow-up with specialist  Orders:  -     benzonatate  (TESSALON ) 200 MG capsule; Take 1 capsule by mouth 3 times daily as needed for Cough, Disp-30 capsule, R-0Normal  -     azithromycin  (ZITHROMAX ) 250 MG tablet; 500mg  on day 1 followed by 250mg  on days 2 - 5, Disp-6 tablet, R-0Normal  -     Dextromethorphan-guaiFENesin  (CORICIDIN HBP CONGESTION/COUGH) 10-200 MG CAPS; Take 1 capsule by mouth in the morning, at noon, in the evening, and at bedtime, Disp-20 capsule, R-0Normal  -     predniSONE  (DELTASONE ) 20 MG tablet; Take 2 tablets by mouth daily for 5 days, Disp-10 tablet, R-0Normal  3. Latent tuberculosis  -     XR CHEST (2 VW); Future  -     CBC with Auto Differential; Future  -     Comprehensive Metabolic Panel; Future  -     Sedimentation Rate; Future  -     benzonatate  (TESSALON ) 200 MG capsule; Take 1 capsule by mouth 3 times daily as needed for Cough, Disp-30 capsule, R-0Normal      Return in about 1 week (around 01/01/2024) for Medicare AWV.     This note was generated in part or in whole with voice recognition software.  Voice recognition is usually quite accurate but there are transcription errors that can often  occur.  All attempts were made to correct these errors I apologized for any  typographical  errors that were not detected and corrected.     Electronically signed by Cara KIDD. Vannie, MD on 12/25/2023 at 7:35 PM.

## 2023-12-25 NOTE — Assessment & Plan Note (Signed)
 We will get CXR blood work.  Prescribed antibiotic, steroid, cough medication  Patient sees pulmonologist was on albuterol  and Advair .  Follow-up with specialist

## 2023-12-26 ENCOUNTER — Inpatient Hospital Stay: Admit: 2023-12-26 | Payer: Medicare (Managed Care) | Primary: Internal Medicine

## 2023-12-26 ENCOUNTER — Inpatient Hospital Stay: Payer: Medicare (Managed Care) | Primary: Internal Medicine

## 2023-12-26 DIAGNOSIS — R053 Chronic cough: Principal | ICD-10-CM

## 2023-12-26 LAB — COMPREHENSIVE METABOLIC PANEL
ALT: 23 U/L (ref 10–40)
AST: 25 U/L (ref 15–37)
Albumin/Globulin Ratio: 1.6 (ref 1.1–2.2)
Albumin: 4.3 g/dL (ref 3.4–5.0)
Alkaline Phosphatase: 103 U/L (ref 40–129)
Anion Gap: 12 (ref 3–16)
BUN: 10 mg/dL (ref 7–20)
CO2: 21 mmol/L (ref 21–32)
Calcium: 9.1 mg/dL (ref 8.3–10.6)
Chloride: 108 mmol/L (ref 99–110)
Creatinine: 1.4 mg/dL — ABNORMAL HIGH (ref 0.8–1.3)
Est, Glom Filt Rate: 55 — AB (ref >60)
Glucose: 109 mg/dL — ABNORMAL HIGH (ref 70–99)
Potassium: 4.3 mmol/L (ref 3.5–5.1)
Sodium: 141 mmol/L (ref 136–145)
Total Bilirubin: 0.3 mg/dL (ref 0.0–1.0)
Total Protein: 7 g/dL (ref 6.4–8.2)

## 2023-12-26 LAB — CBC WITH AUTO DIFFERENTIAL
Basophils %: 0.8 %
Basophils Absolute: 0 K/uL (ref 0.0–0.2)
Eosinophils %: 2.5 %
Eosinophils Absolute: 0.1 K/uL (ref 0.0–0.6)
Hematocrit: 43.6 % (ref 40.5–52.5)
Hemoglobin: 14.9 g/dL (ref 13.5–17.5)
Lymphocytes %: 29 %
Lymphocytes Absolute: 1.7 K/uL (ref 1.0–5.1)
MCH: 29.6 pg (ref 26.0–34.0)
MCHC: 34.2 g/dL (ref 31.0–36.0)
MCV: 86.8 fL (ref 80.0–100.0)
MPV: 10.7 fL — ABNORMAL HIGH (ref 5.0–10.5)
Monocytes %: 8 %
Monocytes Absolute: 0.5 K/uL (ref 0.0–1.3)
Neutrophils %: 59.7 %
Neutrophils Absolute: 3.6 K/uL (ref 1.7–7.7)
Platelets: 181 K/uL (ref 135–450)
RBC: 5.03 M/uL (ref 4.20–5.90)
RDW: 13.8 % (ref 12.4–15.4)
WBC: 6 K/uL (ref 4.0–11.0)

## 2023-12-26 LAB — SEDIMENTATION RATE: Sed Rate, Automated: 8 mm/h (ref 0–20)

## 2023-12-26 NOTE — Other (Signed)
 Please let patient know that his x-ray was negative for acute illness

## 2023-12-26 NOTE — Other (Signed)
 Please let patient know that his lab work was negative.    Since his chest x-ray was negative for acute issue, last CT scan 1/25 demonstrated emphysema recommend that he follows up with his lung doctor regarding his chronic cough.

## 2023-12-27 NOTE — Telephone Encounter (Signed)
lvm #[phone]

## 2023-12-27 NOTE — Telephone Encounter (Signed)
-----   Message from Dr. Cara Finder, MD sent at 12/26/2023 11:59 PM EDT -----  Please let patient know that his lab work was negative.    Since his chest x-ray was negative for acute issue, last CT scan 1/25 demonstrated emphysema recommend that he follows up with his lung doctor regarding his chronic cough.

## 2023-12-27 NOTE — ED Provider Notes (Signed)
 Shared APP Visit Attestation   I personally made/approved the management plan and take responsibility for the patient management.  Please see documentation by the APP for full H&P, emergency department evaluation, and medical decision-making.     68 year old male presents for a cough.  Patient does have a history of this cough that is noted as below.    Patient had an encounter 03/26/2023 with pulmonology for chronic cough and TB that was noted to be latent.  Patient had a CT chest from 2022 which showed chronic atelectatic and fibrotic changes from a bronc of 5/22.  Negative cultures for AFB.  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  At that time there was no need for repeat CT.    Most recent pulmonology encounter 09/24/2023 notes a chronic cough on albuterol .  At that time it was noted that he been out of his inhalers.    Patient presents for a new cough x 1 week.  No night sweats, no weight loss.  There is a chest discomfort.  He describes it similar to the hospitalization that he recently had.    Most recently admitted 09/11/2023 at [facility].  Patient was hospitalized for left-sided chest pain.  Nuke stress negative.  Reproducible chest wall pain.  Improved with ibuprofen.  CT PE negative.    Patient does endorse some epigastric discomfort.  Lungs clear to auscultation.  Does not appear fluid overloaded.    ED Course as of 12/27/23 1700   Thu Dec 27, 2023   1506 12 Lead EKG  My interpretation of the EKG, sinus bradycardia, 56 bpm, low voltage, normal axis, T wave inversion in V3, no ST segment elevation   1645 CT Chest PE Protocol  Negative for pulmonary embolism or other acute cardiopulmonary proces   1700 Will trial Tessalon  Perles for the cough, as well as Pepcid  potentially for what may be acid reflux type picture given the reproducible epigastric tenderness.  Low suspicion for PE given negative scan.  He had negative cross-sectional imaging with regard to the tuberculosis, that was noted to be resolved  with pulmonology previously.       Michael Knight. Lovenia, MD, [facility]  Emergency Medicine Physician

## 2023-12-27 NOTE — ED Notes (Signed)
 Patient discharged to home, alert and oriented, skin warm, dry and pink. Denies needs and or questions. Will follow-up as directed, patient encouraged to return for worsening or new symptoms or other concerns.

## 2023-12-27 NOTE — ED Provider Notes (Signed)
 [facility] Emergency [facility]  [facility]  Lauraine Hence, APRN    TIAGE CHIEF COMPLAINT:  Chief Complaint   Patient presents with   . Chest Pain     Name: Michael Knight, 68 year old, male  MRN: [id number]     HPI: Michael Knight is a 68 year old male who presents to the ED with c/o persistent cough and chest pain.  Patient has a history of a chronic cough.  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  Pulmonology has cleared him from any concerns regarding TB.  He reports that he has a continued cough.  He also has some chest pain associated with this.  He denies fever, chills, cold or flulike symptoms,  abdominal pain, nausea, vomiting, diarrhea or urinary complaints.    ROS: See HPI, otherwise negative    PAST MEDICAL HISTORY:   No past medical history on file.    FAMILY HISTORY:   No family history on file.    SOCIAL HISTORY:   Social History     Socioeconomic History   . Marital status: Divorced     Spouse name: Not on file   . Number of children: Not on file   . Years of education: Not on file   . Highest education level: Not on file   Occupational History   . Not on file   Tobacco Use   . Smoking status: Not on file   . Smokeless tobacco: Not on file   Substance and Sexual Activity   . Alcohol use: Not on file   . Drug use: Not on file   . Sexual activity: Not on file   Other Topics Concern   . Not on file   Social History Narrative   . Not on file     Social Drivers of Health     Financial Resource Strain: Low Risk  (02/22/2023)    Received from [facility]    Overall Physicist, medical Strain (CARDIA)    . Difficulty of Paying Living Expenses: Not hard at all   Food Insecurity: No Food Insecurity (12/25/2023)    Received from [facility]    Hunger Vital Sign    . Within the past 12 months, you worried that your food would run out before you got the money to buy more.: Never true    . Within the past 12 months, the food you  bought just didn't last and you didn't have money to get more.: Never true   Transportation Needs: No Transportation Needs (12/25/2023)    Received from [facility]    PRAPARE - Transportation    . Lack of Transportation (Medical): No    . Lack of Transportation (Non-Medical): No   Physical Activity: Sufficiently Active (03/01/2023)    Received from [facility]    Exercise Vital Sign    . On average, how many days per week do you engage in moderate to strenuous exercise (like a brisk walk)?: 7 days    . On average, how many minutes do you engage in exercise at this level?: 30 min   Stress: Not on file   Social Connections: Not on file   Intimate Partner Violence: Not on file   Housing Stability: Low Risk  (12/25/2023)    Received from [facility]    Housing Stability Vital Sign    .  In the last 12 months, was there a time when you were not able to pay the mortgage or rent on time?: No    . In the past 12 months, how many times have you moved where you were living?: 0    . At any time in the past 12 months, were you homeless or living in a shelter (including now)?: No     SURGICAL HISTORY:   No past surgical history on file.    CURRENT MEDICATIONS:   Home medications reviewed.    ALLERGIES: Patient has no known allergies.    PHYSICAL EXAM:  VITAL SIGNS  ED Vitals  Temp: 97.6 F (36.4 C) (12/27/23 1526)  Pulse: 61 (12/27/23 1601)  Resp: 26 (12/27/23 1601)  BP: 117/65 (12/27/23 1601)  MAP: Noninvasive: 86 mmHg (12/27/23 1601)  SpO2: 96 % (12/27/23 1601)    CONSTITUTIONAL: Awake, oriented x4, appears non-toxic  HENT: Atraumatic, normocephalic, oral mucosa pink and moist, airway patent  EYES: Conjunctiva clear, PERRL  NECK: Trachea midline, supple  CARDIOVASCULAR: Regular rate and rhythm, normal heart tones  PULMONARY/CHEST: Normal respiratory effort, breath sounds clear bilaterally  ABDOMINAL: Non-distended, soft, non-tender, no guarding, rebound or  rigidity  NEUROLOGIC: Non-focal, GCS15  EXTREMITIES: No cyanosis, or edema, motor and sensation intact  SKIN: Warm, Dry    RADIOLOGY  CT CHEST PE PROTOCOL   Final Result   IMPRESSION: Negative for pulmonary embolism or other acute cardiopulmonary process.      Jerilynn PARAS. Ashker, D.O.   Workstation PI:[id number]         LABS  Labs Reviewed   BASIC METABOLIC PANEL - Abnormal; Notable for the following components:       Result Value    Carbon Dioxide 17 (*)     Glucose 176 (*)     All other components within normal limits   COMPLETE BLOOD COUNT WITH DIFFERENTIAL - Abnormal; Notable for the following components:    % Neutrophils 88.1 (*)     % Lymphocytes 9.7 (*)     % Monocytes 1.6 (*)     Absolute Neutrophils 8.3 (*)     All other components within normal limits   TROPONIN T (BASELINE) - Normal    Narrative:     Patients who present with symptoms suggestive of acute coronary syndrome should have Gen 5 Troponin T assay (high sensitivity Troponin T Assay) interpreted in conjunction with clinical presentation, signs and symptoms, risk stratification with HEART score, ECG testing, imaging, etc. Baseline and serial troponin testing (when clinically indicated), to assess for significant delta (rise and/or fall), will assist the clinician in differentiating between ischemic and non-ischemic causes of myocardial injury.   HEPATIC FUNCTION PANEL - Normal   LIPASE - Normal   TROPONIN T (1 HOUR)    Narrative:     Patients who present with symptoms suggestive of acute coronary syndrome should have Gen 5 Troponin T assay (high sensitivity Troponin T Assay) interpreted in conjunction with clinical presentation, signs and symptoms, risk stratification with HEART score, ECG testing, imaging, etc. Baseline and serial troponin testing (when clinically indicated), to assess for significant delta (rise and/or fall), will assist the clinician in differentiating between ischemic and non-ischemic causes of myocardial injury.      MDM:  Independent Historian: None  External Records Review: None    68 year old male with pertinent PMH of cough presents to the ED with persistent cough with associated chest pain. On presentation, vital signs as above, patient is in  no acute distress.  On arrival he is breathing easily.  He is resting comfortably.  He does have a dry cough that is appreciated.  He does not have any adventitious lung sounds.  His oxygen saturation is appropriate at baseline.  Cardiac workup was reassuring.  His EKG was nonacute.  He does not have a leukocytosis.  His hemoglobin is stable.  He does not have any significant electrolyte abnormalities.  LFTs, lipase and serial cardiac troponins are all normal.  He did point to his pain as being more in the epigastric region.  His abdominal examination does not show any evidence of peritonitis.  He does not have a surgically acute abdomen.  Chest CT was completed that was negative for any acute findings.  At this time we do feel that he is stable and appropriate for discharge home with continued workup in the outpatient setting.  He will be discharged with prescriptions for Tessalon  and Pepcid .  He was discharged in good condition.    Significant Results:   ED Course as of 12/28/23 1724   Thu Dec 27, 2023   1506 WBC COUNT: 9.4   1506 HEMOGLOBIN: 14.3   1623 Basic Metabolic Panel STAT(!)  Unremarkable   1623 Hepatic Function Panel  Notmal   1623 LIPASE: 24  Normal   1623 TROPONIN T: <6  Baseline   1647 TROPONIN T: <6  One hour delta, stable     Independent Interpretation of Studies: EKG - non acute    ED Medications:   Medications   iohexoL  (OMNIPAQUE ) 350 mg iodine/mL intravenous solution 100 mL (100 mL IV Push Given 12/27/23 1628)   saline flush (1 Syringe IV Push Given 12/27/23 1627)   famotidine  (PEPCID ) tablet 20 mg (20 mg Oral Given 12/27/23 1701)   benzonatate  (TESSALON  PERLE) capsule 100 mg (100 mg Oral Given 12/27/23 1701)     Response to ED Medications: Stable    Discussion of  Management: None  Escalation/De-Escalation of Care: Appropriate for outpatient management  Additional Considerations Affecting Care: None  Condition at Disposition: Good  Time of Decision: 1655  Discharge Medications:   Discharge Medication List as of 12/27/2023  4:56 PM        START taking these medications    Details   famotidine  (PEPCID ) 20 mg tablet Take 1 Tab by mouth in the morning and 1 Tab before bedtime. Do all this for 15 days., Disp-30 Tab, R-0, Normal      benzonatate  (TESSALON  PERLE) 100 mg capsule Take 1 Cap by mouth three times a day as needed for up to 30 doses., Disp-20 Cap, R-0, Normal            Billable Critical Care Time: 0 minutes of critical care time was spent on this patient. This includes time spent at the bedside actively resuscitating patient, reviewing test results, discussing the case with staff and consultants, documenting in the medical record, and time spent with family members discussing specific treatment issues. The time involved in the performance of separately reportable procedures was not counted toward critical care time.    Patient staffed with Holbrook, Michael B, MD, who also completed a face to face evaluation of the patient    FINAL IMPRESSION:  1. Subacute cough       Electronically Signed by Lauraine Hence, APRN 5:24 PM, 12/28/2023

## 2024-01-03 ENCOUNTER — Encounter

## 2024-01-04 ENCOUNTER — Encounter

## 2024-01-04 MED ORDER — TRAZODONE HCL 50 MG PO TABS
50 | ORAL_TABLET | Freq: Every evening | ORAL | 5 refills | 30.00000 days | Status: DC
Start: 2024-01-04 — End: 2024-04-14

## 2024-01-04 MED ORDER — UBRELVY 100 MG PO TABS
100 | ORAL_TABLET | Freq: Every day | ORAL | 5 refills | 30.00000 days | Status: AC | PRN
Start: 2024-01-04 — End: ?

## 2024-01-04 NOTE — Telephone Encounter (Signed)
 Recent Visits  Date Type Provider Dept   12/25/23 Office Visit Vannie Cara KIDD, MD [facility]   09/25/23 Office Visit Vannie Cara KIDD, MD [facility]   07/10/23 Office Visit Vannie Cara KIDD, MD [facility]   03/01/23 Office Visit Vannie Cara KIDD, MD [facility]   02/22/23 Office Visit Vannie Cara KIDD, MD [facility]   Showing recent visits within past 540 days with a meds authorizing provider and meeting all other requirements  Future Appointments  Date Type Provider Dept   03/03/24 Appointment Vannie Cara KIDD, MD [facility]   Showing future appointments within next 150 days with a meds authorizing provider and meeting all other requirements     12/25/2023

## 2024-01-14 ENCOUNTER — Ambulatory Visit
Admit: 2024-01-14 | Discharge: 2024-01-14 | Payer: Medicare (Managed Care) | Attending: Internal Medicine | Primary: Internal Medicine

## 2024-01-14 ENCOUNTER — Inpatient Hospital Stay
Admit: 2024-01-14 | Discharge: 2024-01-18 | Disposition: A | Discharge status: Home Health | Payer: Medicare (Managed Care) | Attending: Family Medicine | Admitting: Family Medicine

## 2024-01-14 ENCOUNTER — Emergency Department: Admit: 2024-01-14 | Payer: Medicare (Managed Care) | Primary: Internal Medicine

## 2024-01-14 VITALS — BP 128/76 | HR 58 | Ht 63.0 in | Wt 152.4 lb

## 2024-01-14 DIAGNOSIS — J441 Chronic obstructive pulmonary disease with (acute) exacerbation: Principal | ICD-10-CM

## 2024-01-14 DIAGNOSIS — J4541 Moderate persistent asthma with (acute) exacerbation: Principal | ICD-10-CM

## 2024-01-14 LAB — BLOOD GAS, VENOUS
Base Excess, Ven: -3.7 mmol/L — ABNORMAL LOW (ref <=3.0)
Carboxyhemoglobin: 2.1 % — ABNORMAL HIGH (ref 0.0–1.5)
HCO3, Venous: 21.5 mmol/L — ABNORMAL LOW (ref 23.0–29.0)
MetHgb, Ven: 0.8 % (ref <1.5)
O2 Content, Ven: 19 %{vol}
O2 Sat, Ven: 99 %
PO2, Ven: 117 mmHg — ABNORMAL HIGH (ref 25.0–40.0)
TC02 (Calc), Ven: 51 mmol/L
pCO2, Ven: 38.8 mmHg — ABNORMAL LOW (ref 40.0–50.0)
pH, Ven: 7.353 (ref 7.350–7.450)

## 2024-01-14 LAB — CBC WITH AUTO DIFFERENTIAL
Basophils %: 1 %
Basophils Absolute: 0.1 K/uL (ref 0.0–0.2)
Eosinophils %: 1.4 %
Eosinophils Absolute: 0.1 K/uL (ref 0.0–0.6)
Hematocrit: 39.8 % — ABNORMAL LOW (ref 40.5–52.5)
Hemoglobin: 13.8 g/dL (ref 13.5–17.5)
Lymphocytes %: 25.5 %
Lymphocytes Absolute: 1.5 K/uL (ref 1.0–5.1)
MCH: 31.7 pg (ref 26.0–34.0)
MCHC: 34.7 g/dL (ref 31.0–36.0)
MCV: 91.4 fL (ref 80.0–100.0)
MPV: 8.7 fL (ref 5.0–10.5)
Monocytes %: 7.7 %
Monocytes Absolute: 0.4 K/uL (ref 0.0–1.3)
Neutrophils %: 64.4 %
Neutrophils Absolute: 3.7 K/uL (ref 1.7–7.7)
Platelets: 145 K/uL (ref 135–450)
RBC: 4.35 M/uL (ref 4.20–5.90)
RDW: 14.3 % (ref 12.4–15.4)
WBC: 5.7 K/uL (ref 4.0–11.0)

## 2024-01-14 LAB — BASIC METABOLIC PANEL W/ REFLEX TO MG FOR LOW K
Anion Gap: 11 (ref 3–16)
BUN: 10 mg/dL (ref 7–20)
CO2: 20 mmol/L — ABNORMAL LOW (ref 21–32)
Calcium: 9.1 mg/dL (ref 8.3–10.6)
Chloride: 110 mmol/L (ref 99–110)
Creatinine: 1.2 mg/dL (ref 0.8–1.3)
Est, Glom Filt Rate: 66 (ref >60)
Glucose: 102 mg/dL — ABNORMAL HIGH (ref 70–99)
Potassium reflex Magnesium: 4.2 mmol/L (ref 3.5–5.1)
Sodium: 141 mmol/L (ref 136–145)

## 2024-01-14 LAB — RAPID INFLUENZA A/B ANTIGENS
Rapid Influenza A Ag: NEGATIVE
Rapid Influenza B Ag: NEGATIVE

## 2024-01-14 LAB — TROPONIN: Troponin, High Sensitivity: <6 ng/L (ref 0–22)

## 2024-01-14 LAB — BRAIN NATRIURETIC PEPTIDE: NT Pro-BNP: 153 pg/mL — ABNORMAL HIGH (ref 0–124)

## 2024-01-14 MED ORDER — PANTOPRAZOLE SODIUM 40 MG PO TBEC
40 | Freq: Every day | ORAL | Status: DC
Start: 2024-01-14 — End: 2024-01-15
  Administered 2024-01-15: 11:00:00 40 mg via ORAL

## 2024-01-14 MED ORDER — BENZONATATE 100 MG PO CAPS
100 | Freq: Three times a day (TID) | ORAL | Status: DC | PRN
Start: 2024-01-14 — End: 2024-01-18
  Administered 2024-01-15: 13:00:00 100 mg via ORAL

## 2024-01-14 MED ORDER — ONDANSETRON HCL 4 MG/2ML IJ SOLN
4 | Freq: Four times a day (QID) | INTRAMUSCULAR | Status: DC | PRN
Start: 2024-01-14 — End: 2024-01-18

## 2024-01-14 MED ORDER — TAMSULOSIN HCL 0.4 MG PO CAPS
0.4 | Freq: Every evening | ORAL | Status: DC
Start: 2024-01-14 — End: 2024-01-18
  Administered 2024-01-14 – 2024-01-18 (×4): 0.4 mg via ORAL

## 2024-01-14 MED ORDER — IOHEXOL 350 MG/ML IV SOLN
350 | Freq: Once | INTRAVENOUS | Status: AC | PRN
Start: 2024-01-14 — End: 2024-01-14
  Administered 2024-01-14: 21:00:00 75 mL via INTRAVENOUS

## 2024-01-14 MED ORDER — TOPIRAMATE 100 MG PO TABS
100 | Freq: Every evening | ORAL | Status: DC
Start: 2024-01-14 — End: 2024-01-18
  Administered 2024-01-14 – 2024-01-18 (×4): 100 mg via ORAL

## 2024-01-14 MED ORDER — TRAZODONE HCL 50 MG PO TABS
50 | Freq: Every evening | ORAL | Status: DC
Start: 2024-01-14 — End: 2024-01-18
  Administered 2024-01-14 – 2024-01-17 (×3): 75 mg via ORAL

## 2024-01-14 MED ORDER — STERILE WATER FOR INJECTION (MIXTURES ONLY)
40 | Freq: Two times a day (BID) | INTRAMUSCULAR | Status: DC
Start: 2024-01-14 — End: 2024-01-16
  Administered 2024-01-14 – 2024-01-16 (×4): 40 mg via INTRAVENOUS

## 2024-01-14 MED ORDER — LEVOTHYROXINE SODIUM 75 MCG PO TABS
75 | Freq: Every day | ORAL | Status: DC
Start: 2024-01-14 — End: 2024-01-18
  Administered 2024-01-15 – 2024-01-18 (×4): 75 ug via ORAL

## 2024-01-14 MED ORDER — ACETAMINOPHEN 325 MG PO TABS
325 | Freq: Four times a day (QID) | ORAL | Status: DC | PRN
Start: 2024-01-14 — End: 2024-01-18
  Administered 2024-01-15: 650 mg via ORAL

## 2024-01-14 MED ORDER — ONDANSETRON 4 MG PO TBDP
4 | Freq: Three times a day (TID) | ORAL | Status: DC | PRN
Start: 2024-01-14 — End: 2024-01-18

## 2024-01-14 MED ORDER — AMLODIPINE BESYLATE 5 MG PO TABS
5 | Freq: Every day | ORAL | Status: DC
Start: 2024-01-14 — End: 2024-01-18
  Administered 2024-01-15 – 2024-01-18 (×4): 10 mg via ORAL

## 2024-01-14 MED ORDER — IPRATROPIUM-ALBUTEROL 0.5-2.5 (3) MG/3ML IN SOLN
0.5-2.5 | RESPIRATORY_TRACT | Status: DC
Start: 2024-01-14 — End: 2024-01-15
  Administered 2024-01-14 – 2024-01-15 (×6): 1 via RESPIRATORY_TRACT

## 2024-01-14 MED ORDER — DOXYCYCLINE HYCLATE 100 MG PO TABS
100 | Freq: Two times a day (BID) | ORAL | Status: DC
Start: 2024-01-14 — End: 2024-01-18
  Administered 2024-01-14 – 2024-01-18 (×8): 100 mg via ORAL

## 2024-01-14 MED ORDER — SODIUM CHLORIDE 0.9 % IV SOLN
0.9 | INTRAVENOUS | Status: DC | PRN
Start: 2024-01-14 — End: 2024-01-18
  Administered 2024-01-16: 13:00:00 via INTRAVENOUS

## 2024-01-14 MED ORDER — PRAVASTATIN SODIUM 40 MG PO TABS
40 | Freq: Every evening | ORAL | Status: DC
Start: 2024-01-14 — End: 2024-01-18
  Administered 2024-01-14 – 2024-01-18 (×4): 40 mg via ORAL

## 2024-01-14 MED ORDER — ONDANSETRON HCL 4 MG/2ML IJ SOLN
4 | Freq: Four times a day (QID) | INTRAMUSCULAR | Status: DC | PRN
Start: 2024-01-14 — End: 2024-01-14

## 2024-01-14 MED ORDER — FAMOTIDINE 20 MG PO TABS
20 | Freq: Two times a day (BID) | ORAL | Status: DC
Start: 2024-01-14 — End: 2024-01-15
  Administered 2024-01-14 – 2024-01-15 (×2): 20 mg via ORAL

## 2024-01-14 MED ORDER — UBROGEPANT 100 MG PO TABS
100 | Freq: Every day | ORAL | Status: DC | PRN
Start: 2024-01-14 — End: 2024-01-18
  Administered 2024-01-15 – 2024-01-16 (×2): 100 mg via ORAL

## 2024-01-14 MED ORDER — ENOXAPARIN SODIUM 40 MG/0.4ML IJ SOSY
40 | Freq: Every day | INTRAMUSCULAR | Status: DC
Start: 2024-01-14 — End: 2024-01-14

## 2024-01-14 MED ORDER — NORMAL SALINE FLUSH 0.9 % IV SOLN
0.9 | INTRAVENOUS | Status: DC | PRN
Start: 2024-01-14 — End: 2024-01-18
  Administered 2024-01-14 – 2024-01-15 (×2): 10 mL via INTRAVENOUS

## 2024-01-14 MED ORDER — POTASSIUM CHLORIDE 10 MEQ/100ML IV SOLN
10 | INTRAVENOUS | Status: DC | PRN
Start: 2024-01-14 — End: 2024-01-18

## 2024-01-14 MED ORDER — NORMAL SALINE FLUSH 0.9 % IV SOLN
0.9 | Freq: Two times a day (BID) | INTRAVENOUS | Status: DC
Start: 2024-01-14 — End: 2024-01-18
  Administered 2024-01-14 – 2024-01-18 (×8): 10 mL via INTRAVENOUS

## 2024-01-14 MED ORDER — POTASSIUM CHLORIDE CRYS ER 20 MEQ PO TBCR
20 | ORAL | Status: DC | PRN
Start: 2024-01-14 — End: 2024-01-18

## 2024-01-14 MED ORDER — ALBUTEROL SULFATE HFA 108 (90 BASE) MCG/ACT IN AERS
108 | Freq: Four times a day (QID) | RESPIRATORY_TRACT | Status: DC | PRN
Start: 2024-01-14 — End: 2024-01-18

## 2024-01-14 MED ORDER — BUDESONIDE-FORMOTEROL FUMARATE 80-4.5 MCG/ACT IN AERO
80-4.5 | Freq: Two times a day (BID) | RESPIRATORY_TRACT | Status: DC
Start: 2024-01-14 — End: 2024-01-18
  Administered 2024-01-14 – 2024-01-18 (×8): 2 via RESPIRATORY_TRACT

## 2024-01-14 MED ORDER — DEXAMETHASONE SODIUM PHOSPHATE 4 MG/ML IJ SOLN
4 | Freq: Once | INTRAMUSCULAR | Status: AC
Start: 2024-01-14 — End: 2024-01-14
  Administered 2024-01-14: 19:00:00 4 mg via INTRAVENOUS

## 2024-01-14 MED ORDER — ENOXAPARIN SODIUM 40 MG/0.4ML IJ SOSY
40 | Freq: Every day | INTRAMUSCULAR | Status: DC
Start: 2024-01-14 — End: 2024-01-18
  Administered 2024-01-14 – 2024-01-18 (×3): 40 mg via SUBCUTANEOUS

## 2024-01-14 MED ORDER — IPRATROPIUM-ALBUTEROL 0.5-2.5 (3) MG/3ML IN SOLN
0.5-2.5 | Freq: Four times a day (QID) | RESPIRATORY_TRACT | Status: DC
Start: 2024-01-14 — End: 2024-01-14
  Administered 2024-01-14: 19:00:00 1 via RESPIRATORY_TRACT

## 2024-01-14 MED ORDER — PROPRANOLOL HCL 20 MG PO TABS
20 | Freq: Every day | ORAL | Status: DC
Start: 2024-01-14 — End: 2024-01-18
  Administered 2024-01-15 – 2024-01-16 (×2): 40 mg via ORAL

## 2024-01-14 MED ORDER — DOXYCYCLINE HYCLATE 100 MG PO TABS
100 | Freq: Two times a day (BID) | ORAL | Status: DC
Start: 2024-01-14 — End: 2024-01-14

## 2024-01-14 MED ORDER — MAGNESIUM SULFATE 2000 MG/50 ML IVPB PREMIX
2 | INTRAVENOUS | Status: DC | PRN
Start: 2024-01-14 — End: 2024-01-18

## 2024-01-14 MED ORDER — DM-GUAIFENESIN ER 30-600 MG PO TB12
30-600 | Freq: Two times a day (BID) | ORAL | Status: DC | PRN
Start: 2024-01-14 — End: 2024-01-18

## 2024-01-14 MED ORDER — ONDANSETRON 4 MG PO TBDP
4 | Freq: Three times a day (TID) | ORAL | Status: DC | PRN
Start: 2024-01-14 — End: 2024-01-14

## 2024-01-14 MED ORDER — POTASSIUM BICARB-CITRIC ACID 20 MEQ PO TBEF
20 | ORAL | Status: DC | PRN
Start: 2024-01-14 — End: 2024-01-18

## 2024-01-14 MED ORDER — POLYETHYLENE GLYCOL 3350 17 G PO PACK
17 | Freq: Every day | ORAL | Status: DC | PRN
Start: 2024-01-14 — End: 2024-01-18

## 2024-01-14 MED ORDER — ACETAMINOPHEN 650 MG RE SUPP
650 | Freq: Four times a day (QID) | RECTAL | Status: DC | PRN
Start: 2024-01-14 — End: 2024-01-18

## 2024-01-14 MED FILL — FAMOTIDINE 20 MG PO TABS: 20 mg | ORAL | Qty: 1 | Fill #0

## 2024-01-14 MED FILL — TRAZODONE HCL 50 MG PO TABS: 50 mg | ORAL | Qty: 2 | Fill #0

## 2024-01-14 MED FILL — IPRATROPIUM-ALBUTEROL 0.5-2.5 (3) MG/3ML IN SOLN: 0.5-2.5 (3) MG/3ML | RESPIRATORY_TRACT | Qty: 3 | Fill #0

## 2024-01-14 MED FILL — DEXAMETHASONE SODIUM PHOSPHATE 4 MG/ML IJ SOLN: 4 mg/mL | INTRAMUSCULAR | Qty: 1 | Fill #0

## 2024-01-14 MED FILL — TOPIRAMATE 25 MG PO TABS: 25 mg | ORAL | Qty: 4 | Fill #0

## 2024-01-14 MED FILL — IPRATROPIUM-ALBUTEROL 0.5-2.5 (3) MG/3ML IN SOLN: 0.5-2.5 (3) MG/3ML | RESPIRATORY_TRACT | Qty: 18 | Fill #0

## 2024-01-14 MED FILL — LOVENOX 40 MG/0.4ML IJ SOSY: 40 MG/0.4ML | INTRAMUSCULAR | Qty: 0.4 | Fill #0

## 2024-01-14 MED FILL — METHYLPREDNISOLONE SODIUM SUCC 40 MG IJ SOLR: 40 mg | INTRAMUSCULAR | Qty: 40 | Fill #0

## 2024-01-14 MED FILL — SYMBICORT 80-4.5 MCG/ACT IN AERO: 80-4.5 MCG/ACT | RESPIRATORY_TRACT | Qty: 1 | Fill #0

## 2024-01-14 MED FILL — MUCUS RELIEF DM 30-600 MG PO TB12: 30-600 mg | ORAL | Qty: 1 | Fill #0

## 2024-01-14 MED FILL — ACETAMINOPHEN 325 MG PO TABS: 325 mg | ORAL | Qty: 2 | Fill #0

## 2024-01-14 MED FILL — DOXYCYCLINE HYCLATE 100 MG PO TABS: 100 mg | ORAL | Qty: 1 | Fill #0

## 2024-01-14 MED FILL — FLOMAX 0.4 MG PO CAPS: 0.4 mg | ORAL | Qty: 1 | Fill #0

## 2024-01-14 MED FILL — PRAVASTATIN SODIUM 40 MG PO TABS: 40 mg | ORAL | Qty: 1 | Fill #0

## 2024-01-14 MED FILL — PROAIR HFA 108 (90 BASE) MCG/ACT IN AERS: 108 (90 Base) MCG/ACT | RESPIRATORY_TRACT | Qty: 1 | Fill #0

## 2024-01-14 NOTE — Other (Signed)
 Urine collected per urinal and sent to lab for ordered Legionella antigen testing

## 2024-01-14 NOTE — Plan of Care (Signed)
 Problem: Hematologic - Adult  Goal: Maintains hematologic stability  Outcome: Progressing      01/14/24 1950   Vitals   Temp (!) 94.9 F (34.9 C)  (thermometer not reading properly;will recheck temp with a differerent thermometer)   Temp Source Oral   Pulse 50   Heart Rate Source Brachial;Radial   Respirations 24   BP 129/76   MAP (Calculated) 94   BP Location Left upper arm   BP Upper/Lower Upper   BP Method Automatic   Patient Position High fowlers   Pain Assessment   Pain Assessment 0-10   Pain Level 9   Pain Location Head   Pain Orientation Other (Comment)  (throughtout top of head)   Non-Pharmaceutical Pain Intervention(s) Provider notified (comment)  (messaged hospitalist about pt's home med for migraine)   Oxygen Therapy   SpO2 96 %   Pulse Oximeter Device Mode Intermittent   Pulse Oximeter Device Location Finger   O2 Device None (Room air)

## 2024-01-14 NOTE — ED Notes (Signed)
 Patient Name: Michael Knight  DOB: 09-11-1955 68 y.o.  MRN: [id number]  ED Room #: ED-0012/12     Chief complaint:   Chief Complaint   Patient presents with    Chest Pain     Chest pain and cough x 15 days. Sent by PCP.      Hospital Problem/Diagnosis:   Hospital Problems           Last Modified POA    * (Principal) COPD exacerbation (HCC) 01/14/2024 Yes         O2 Flow Rate:O2 Device: None (Room air)   (if applicable)  Cardiac Rhythm:   (if applicable)  Active LDA's:   Peripheral IV 01/14/24 Left Hand (Active)            How does patient ambulate? Low Fall Risk (Ambulates by themselves without support    2. How does patient take pills? Whole with Water     3. Is patient alert? Alert    4. Is patient oriented? To Person, To Place, To Time, To Situation, and Follows Commands    5.   Patient arrived from:  home  Facility Name: ___________________________________________    6. If patient is disoriented or from a Skill Nursing Facility has family been notified of admission?      7. Patient belongings? Belongings:      Disposition of belongings? Kept with Patient     8. Any specific patient or family belongings/needs/dynamics?   a.     9. Miscellaneous comments/pending orders?  a.       If there are any additional questions please reach out to the Emergency Department.

## 2024-01-14 NOTE — Consults (Signed)
 Session ID: [id number]  Session Duration: 28 minutes  Language: Nepali  Interpreter ID: #[id number]  Interpreter Name: Jerrie

## 2024-01-14 NOTE — ED Notes (Signed)
 Ambulated to bathroom without incident.

## 2024-01-14 NOTE — Consults (Signed)
 Session ID: [id number]  Session Duration: 6 minutes  Language: Nepali  Interpreter ID: #[id number]  Interpreter Name: Jerrie

## 2024-01-14 NOTE — Other (Signed)
 Messaged resident via perfectserve at 8:47 PM, Pt was admitted today with COPD Exacerbation and has Hx Migraines. Pt called RN in room for migraine medicine. Pt showed RN a tablet in a wrapper UBRELVY  100mg  and wanted to take this dose from home. This is not ordered. Pt was agreeable to taking tylenol  and took PRN dose as ordered. Med rec does have this UBRELVY  listed, but can we please order this for here? Pt states Tylenol  is not normally very effective.    Message Read at 8:58 PM, and resident responded at 9:10 PM, Let me check with follow up at  9:18 PM, Order is in, you just have to take the medication to pharmacy and they will label it as patient supplied and give it back to you to administer

## 2024-01-14 NOTE — Plan of Care (Signed)
 Problem: Pain  Goal: Verbalizes/displays adequate comfort level or baseline comfort level  Outcome: Progressing      01/14/24 2004   Vitals   Temp 97.8 F (36.6 C)   Temp Source Temporal   Pain Assessment   Pain Assessment 0-10   Pain Level 9   Pain Location Head   Non-Pharmaceutical Pain Intervention(s) Environmental changes  (lights dimmed, blinds closed, and TV off. Tylenol  given; see MAR)

## 2024-01-14 NOTE — H&P (Signed)
 Internal Medicine Resident  History & Physical        Name:  Michael Knight  DOB/Age/Sex: Jun 15, 1955  (68 y.o. male)  MRN & CSN:  [id number] & [id number]     PCP: Vannie Cara KIDD, MD    Date of Admission: 01/14/2024    Date of Service: Patient seen/examined on 01/14/2024    Patient Status:  Observation - Patient can most likely be adequately treated within 2 midnights from hospital admission     Chief Complaint:    Chief Complaint   Patient presents with    Chest Pain     Chest pain and cough x 15 days. Sent by PCP.          History Of Present Illness:     68 y.o. male with PMHx of COPD, HTN, hypothyroidism, BPH, latent TB who presented to [facility] with complaints of cough with sputum and worsening shortness of breath, referred from the pulmonology office.  Patient stated that he has been having cough for around 2 weeks with sputum production for a week.  He was seen in the ER 2 weeks ago with similar complaints, with no improvement.  Patient is on Advair  at home, and has relief of symptoms with saline water  gargling.  He has a past history of smoking, which she stopped after 2012.  Patient stated that he does have trivial chest pain on inspiring deeply.  He has no history of fevers or chills. He stated that he sleeps with 6 pillows at night, but has no shortness of breath at rest.   Other review of systems negative.  He was admitted in April for symptoms of chest pain and a negative cardiac workup. Cleared by Pulmonology for latent TB in 2024.    In the ED, patient had a saturation of 94% on room air at the time of presentation, wheezing on exam. Patient was started on DuoNeb  and doxycycline , which improved symptoms.  VBG 7.35/38.8/117/21.5 .CT chest and respiratory panel was ordered.    Past Medical History:          Diagnosis Date    Asthma     Back pain     Dyslipidemia     GERD (gastroesophageal reflux disease)     Hyperlipidemia     Hypertension     Hypothyroidism        Past Surgical History:           Procedure Laterality Date    BRONCHOSCOPY N/A 10/28/2020    BRONCHOSCOPY ALVEOLAR LAVAGE performed by Laurance VEAR Leu, MD at [facility] ENDOSCOPY    ELBOW SURGERY Right     HAND SURGERY      UPPER GASTROINTESTINAL ENDOSCOPY N/A 02/08/2018    EGD BIOPSY performed by Garnette SHAUNNA Lunger, MD at [facility] ENDOSCOPY       Medications Prior to Admission:      Prior to Admission medications    Medication Sig Start Date End Date Taking? Authorizing Provider   levothyroxine  (SYNTHROID ) 75 MCG tablet Take 1 tablet by mouth Daily   Yes [provider]   pravastatin  (PRAVACHOL ) 40 MG tablet Take 1 tablet by mouth nightly   Yes [provider]   tamsulosin  (FLOMAX ) 0.4 MG capsule Take 1 capsule by mouth nightly   Yes [provider]   famotidine  (PEPCID ) 20 MG tablet Take 1 tablet by mouth 2 times daily   Yes [provider]   traZODone  (DESYREL ) 50 MG tablet Take 1 tablet  by mouth nightly 1 and 1/2 tabs  Patient taking differently: Take 1.5 tablets by mouth nightly 1 and 1/2 tabs 01/04/24  Yes Vannie Cara KIDD, MD   Ubrogepant  (UBRELVY ) 100 MG TABS Take 100 mg by mouth daily as needed (migraine headaches) 01/04/24  Yes Vannie Cara KIDD, MD   Dextromethorphan-guaiFENesin  (CORICIDIN HBP CONGESTION/COUGH) 10-200 MG CAPS Take 1 capsule by mouth in the morning, at noon, in the evening, and at bedtime 12/25/23  Yes Walker, Cara KIDD, MD   meclizine  (ANTIVERT ) 12.5 MG tablet TAKE 1 TABLET BY MOUTH 3 TIMES A DAY AS NEEDED FOR DIZZINESS  Patient taking differently: Take 1 tablet by mouth 3 times daily as needed for Dizziness 10/01/23  Yes Vannie Cara KIDD, MD   albuterol  sulfate HFA (PROVENTIL ;VENTOLIN ;PROAIR ) 108 (90 Base) MCG/ACT inhaler Inhale 2 puffs into the lungs 4 times daily as needed for Wheezing 09/24/23  Yes Lakshminarayana, Laurance DEL, MD   fluticasone -salmeterol (ADVAIR  DISKUS) 250-50 MCG/ACT AEPB diskus inhaler Inhale 1 puff into the lungs in the morning and 1 puff in the evening. 09/24/23   Yes Lakshminarayana, Pradeep H, MD   propranolol  (INDERAL ) 40 MG tablet TAKE 1 TABLET BY MOUTH EVERY DAY WITH BREAKFAST 08/23/23  Yes Walker, Cara KIDD, MD   magnesium  gluconate (MAGONATE) 500 MG tablet TAKE 1 TABLET BY MOUTH TWICE A DAY 08/14/23  Yes Vannie Cara KIDD, MD   amLODIPine  (NORVASC ) 10 MG tablet Take 1 tablet by mouth daily 03/01/23  Yes Walker, Dwayne O, MD   pantoprazole  (PROTONIX ) 40 MG tablet Take 1 tablet by mouth every morning (before breakfast) 03/01/23  Yes Vannie Cara KIDD, MD   topiramate  (TOPAMAX ) 100 MG tablet Take 1 tablet by mouth nightly 02/22/23  Yes Vannie Cara KIDD, MD   Incontinence Supply Disposable (DEPEND ADJUSTABLE UNDERWEAR) MISC 1 each by Does not apply route in the morning, at noon, and at bedtime 09/25/23   Vannie Cara KIDD, MD   Misc. Devices (CANE) MISC 1 each by Does not apply route daily 09/25/23   Vannie Cara KIDD, MD   meloxicam  (MOBIC ) 7.5 MG tablet Take 1 tablet by mouth 2 times daily  Patient not taking: Reported on 01/14/2024 03/01/23   Walker, Dwayne O, MD   hydroCHLOROthiazide (MICROZIDE) 12.5 MG capsule Take 1 capsule by mouth daily  12/30/20  [provider]       Allergies:  Patient has no known allergies.    Social History:      TOBACCO:   reports that he has quit smoking. He has never used smokeless tobacco.  ETOH:   reports that he does not currently use alcohol.  E-cigarette/Vaping       Questions Responses    E-cigarette/Vaping Use Never User    Start Date     Passive Exposure     Quit Date     Counseling Given     Comments Unknown              Family History:      Reviewed and negative in regards to presenting illness/complaint.    History reviewed. No pertinent family history.    REVIEW OF SYSTEMS COMPLETED:   Pertinent positives as noted in the HPI. All other systems reviewed and negative.    PHYSICAL EXAM PERFORMED:    BP 120/80   Pulse 51   Temp 97.5 F (36.4 C) (Oral)   Resp 15   Ht 1.575 m (5' 2)   Wt 68.9 kg (152 lb)   SpO2 97%  BMI 27.80 kg/m      General appearance:  No apparent distress, appears stated age and cooperative.   HEENT:  Normal cephalic, atraumatic without obvious deformity. Pupils equal, round, and reactive to light.  Extra ocular muscles intact. Conjunctivae/corneas clear.  Neck: Supple, with full range of motion. No jugular venous distention. Trachea midline.  Respiratory:  Normal respiratory effort. Clear to auscultation, bilaterally without Rales/Wheezes/Rhonchi.  Cardiovascular:  Regular rate and rhythm with normal S1/S2 without murmurs, rubs or gallops. No peripheral edema.  Abdomen: Soft, non-tender, non-distended with normal bowel sounds.  GU: No CVA tenderness  Musculoskeletal:  No clubbing or cyanosis.  Full range of motion without deformity.  Skin: Skin color, texture, turgor normal.  No rashes or lesions.  Neurologic:  Neurovascularly intact without any focal sensory/motor deficits. Cranial nerves: II-XII intact, grossly non-focal.  Psychiatric:  Alert and oriented, thought content appropriate, normal insight  Capillary Refill: Brisk, 3 seconds, normal   Peripheral Pulses: +2 palpable, equal bilaterally       Labs:     Recent Labs     01/14/24  1421   WBC 5.7   HGB 13.8   HCT 39.8*   PLT 145     Recent Labs     01/14/24  1421   NA 141   K 4.2   CL 110   CO2 20*   BUN 10   CREATININE 1.2   CALCIUM 9.1     No results for input(s): AST, ALT, BILIDIR, BILITOT, ALKPHOS in the last 72 hours.  No results for input(s): INR in the last 72 hours.  No results for input(s): CKTOTAL, TROPONINI in the last 72 hours.    Urinalysis:      Lab Results   Component Value Date/Time    NITRU Negative 12/28/2020 05:00 PM    BLOODU Negative 12/28/2020 05:00 PM    GLUCOSEU Negative 12/28/2020 05:00 PM       Radiology:     CT CHEST PULMONARY EMBOLISM W CONTRAST    (Results Pending)           Medications:  Not in a hospital admission.  Current Facility-Administered Medications   Medication Dose Route Frequency Provider Last Rate Last Admin     doxycycline  hyclate (VIBRA -TABS) tablet 100 mg  100 mg Oral 2 times per day Policastro, Michael, MD        ipratropium 0.5 mg-albuterol  2.5 mg (DUONEB ) nebulizer solution 1 Dose  1 Dose Inhalation Q4H RT Policastro, Michael, MD        iohexol  (OMNIPAQUE  350) IntraVENous 75 mL  75 mL IntraVENous ONCE PRN Policastro, Michael, MD         Current Outpatient Medications   Medication Sig Dispense Refill    levothyroxine  (SYNTHROID ) 75 MCG tablet Take 1 tablet by mouth Daily      pravastatin  (PRAVACHOL ) 40 MG tablet Take 1 tablet by mouth nightly      tamsulosin  (FLOMAX ) 0.4 MG capsule Take 1 capsule by mouth nightly      famotidine  (PEPCID ) 20 MG tablet Take 1 tablet by mouth 2 times daily      traZODone  (DESYREL ) 50 MG tablet Take 1 tablet by mouth nightly 1 and 1/2 tabs (Patient taking differently: Take 1.5 tablets by mouth nightly 1 and 1/2 tabs) 45 tablet 5    Ubrogepant  (UBRELVY ) 100 MG TABS Take 100 mg by mouth daily as needed (migraine headaches) 16 tablet 5    Dextromethorphan-guaiFENesin  (CORICIDIN HBP CONGESTION/COUGH) 10-200 MG CAPS Take 1  capsule by mouth in the morning, at noon, in the evening, and at bedtime 20 capsule 0    meclizine  (ANTIVERT ) 12.5 MG tablet TAKE 1 TABLET BY MOUTH 3 TIMES A DAY AS NEEDED FOR DIZZINESS (Patient taking differently: Take 1 tablet by mouth 3 times daily as needed for Dizziness) 90 tablet 3    albuterol  sulfate HFA (PROVENTIL ;VENTOLIN ;PROAIR ) 108 (90 Base) MCG/ACT inhaler Inhale 2 puffs into the lungs 4 times daily as needed for Wheezing 1 each 11    fluticasone -salmeterol (ADVAIR  DISKUS) 250-50 MCG/ACT AEPB diskus inhaler Inhale 1 puff into the lungs in the morning and 1 puff in the evening. 60 each 5    propranolol  (INDERAL ) 40 MG tablet TAKE 1 TABLET BY MOUTH EVERY DAY WITH BREAKFAST 30 tablet 11    magnesium  gluconate (MAGONATE) 500 MG tablet TAKE 1 TABLET BY MOUTH TWICE A DAY 60 tablet 5    amLODIPine  (NORVASC ) 10 MG tablet Take 1 tablet by mouth daily 30 tablet 11     pantoprazole  (PROTONIX ) 40 MG tablet Take 1 tablet by mouth every morning (before breakfast) 90 tablet 1    topiramate  (TOPAMAX ) 100 MG tablet Take 1 tablet by mouth nightly 60 tablet 5    Incontinence Supply Disposable (DEPEND ADJUSTABLE UNDERWEAR) MISC 1 each by Does not apply route in the morning, at noon, and at bedtime 90 each 5    Misc. Devices (CANE) MISC 1 each by Does not apply route daily 1 each 0    meloxicam  (MOBIC ) 7.5 MG tablet Take 1 tablet by mouth 2 times daily (Patient not taking: Reported on 01/14/2024) 30 tablet 3       Consults:    IP CONSULT TO PULMONOLOGY    ASSESSMENT:    Active Hospital Problems    Diagnosis Date Noted    COPD exacerbation (HCC) [J44.1] 01/14/2024         PLAN:  This is a 69 y.o. male who presented to [facility] and is being treated for:    Acute Exacerbation of COPD  Follow up CT chest  Continue Duoneb  and Doxycycline   Follow up cultures  Telemetry monitoring   Pulmonology consult   IV Methylprednisone   SpO2 monitoring     2.Hypertension   Continue Norvasc      DVT ppx: Lovenox   GI ppx: Diet/Tube Feeds  Diet: No diet orders on file  Code Status: Prior    This case was discussed with the ED provider and I agree that the patient should be admitted into the hospital.    PT/OT Eval Status: Not indicated      Therisa Chew, MD  01/14/2024  5:18 PM

## 2024-01-14 NOTE — H&P (Signed)
 HOSPITALISTS HISTORY AND PHYSICAL    01/14/2024 4:05 PM    Patient Information:  Michael Knight is a 68 y.o. male [id number]  PCP:  Vannie Cara KIDD, MD (Tel: [phone] )    Chief complaint:    Chief Complaint   Patient presents with    Chest Pain     Chest pain and cough x 15 days. Sent by PCP.         History of Present Illness:  Michael Knight is a 68 y.o. male who presented with presents from cardiology office concerning for worsening shortness of breath cough congestion  Patient has been ongoing issue for about a month now.  With increased wheezing.  Was seen in the ER earlier this month prescribed Tessalon  Perles and Pepcid  with no improvement continues to be dyspneic.  Has completed antibiotics as well.  Patient unable to complete sentence without getting cough and shortness of breath does have a history of latent TB denies any fevers chills.  Nothing that makes it better or worse patient is on Advair  despite that continues to have persistent cough  Was seen by pulmonology    REVIEW OF SYSTEMS:   Constitutional: Negative for fever,chills or night sweats  ENT: Negative for rhinorrhea, epistaxis, hoarseness, sore throat.  Respiratory: Negative for shortness of breath,wheezing  Cardiovascular: Negative for chest pain, palpitations   Gastrointestinal: Negative for nausea, vomiting, diarrhea  Genitourinary: Negative for polyuria, dysuria   Hematologic/Lymphatic: Negative for bleeding tendency, easy bruising  Musculoskeletal: Negative for myalgias and arthralgias  Neurologic: Negative for confusion,dysarthria.  Skin: Negative for itching,rash, good capillary refill.   Psychiatric: Negative for depression,anxiety, agitation.  Endocrine: Negative for polydipsia,polyuria,heat /cold intolerance.    Past Medical History:   has a past medical history of Asthma, Back pain,  Dyslipidemia, GERD (gastroesophageal reflux disease), Hyperlipidemia, Hypertension, and Hypothyroidism.     Past Surgical History:   has a past surgical history that includes Upper gastrointestinal endoscopy (N/A, 02/08/2018); Hand surgery; Elbow surgery (Right); and bronchoscopy (N/A, 10/28/2020).     Medications:  No current facility-administered medications on file prior to encounter.     Current Outpatient Medications on File Prior to Encounter   Medication Sig Dispense Refill    levothyroxine  (SYNTHROID ) 75 MCG tablet Take 1 tablet by mouth Daily      pravastatin  (PRAVACHOL ) 40 MG tablet Take 1 tablet by mouth nightly      tamsulosin  (FLOMAX ) 0.4 MG capsule Take 1 capsule by mouth nightly      famotidine  (PEPCID ) 20 MG tablet Take 1 tablet by mouth 2 times daily      traZODone  (DESYREL ) 50 MG tablet Take 1 tablet by mouth nightly 1 and 1/2 tabs (Patient taking differently: Take 1.5 tablets by mouth nightly 1 and 1/2 tabs) 45 tablet 5    Ubrogepant  (UBRELVY ) 100 MG TABS Take 100 mg by mouth daily as needed (migraine headaches) 16 tablet 5    Dextromethorphan-guaiFENesin  (CORICIDIN HBP CONGESTION/COUGH) 10-200 MG CAPS Take 1 capsule by mouth in the morning, at noon, in the evening, and at bedtime 20 capsule 0    meclizine  (ANTIVERT ) 12.5 MG tablet TAKE 1 TABLET BY MOUTH 3 TIMES A DAY AS NEEDED FOR DIZZINESS (Patient taking differently: Take 1 tablet by mouth 3 times daily as needed for Dizziness) 90 tablet 3    albuterol  sulfate HFA (PROVENTIL ;VENTOLIN ;PROAIR ) 108 (90 Base) MCG/ACT inhaler Inhale 2 puffs into the lungs 4 times daily as needed for Wheezing 1 each 11  fluticasone -salmeterol (ADVAIR  DISKUS) 250-50 MCG/ACT AEPB diskus inhaler Inhale 1 puff into the lungs in the morning and 1 puff in the evening. 60 each 5    propranolol  (INDERAL ) 40 MG tablet TAKE 1 TABLET BY MOUTH EVERY DAY WITH BREAKFAST 30 tablet 11    magnesium  gluconate (MAGONATE) 500 MG tablet TAKE 1 TABLET BY MOUTH TWICE A DAY 60 tablet 5     amLODIPine  (NORVASC ) 10 MG tablet Take 1 tablet by mouth daily 30 tablet 11    pantoprazole  (PROTONIX ) 40 MG tablet Take 1 tablet by mouth every morning (before breakfast) 90 tablet 1    topiramate  (TOPAMAX ) 100 MG tablet Take 1 tablet by mouth nightly 60 tablet 5    levothyroxine  (SYNTHROID ) 25 MCG tablet Take 1 tablet by mouth Daily (Patient not taking: Reported on 01/14/2024) 30 tablet 11    Incontinence Supply Disposable (DEPEND ADJUSTABLE UNDERWEAR) MISC 1 each by Does not apply route in the morning, at noon, and at bedtime 90 each 5    Misc. Devices (CANE) MISC 1 each by Does not apply route daily 1 each 0    pravastatin  (PRAVACHOL ) 80 MG tablet TAKE 1 TABLET BY MOUTH EVERY DAY AT NIGHT (Patient not taking: Reported on 01/14/2024) 30 tablet 11    meloxicam  (MOBIC ) 7.5 MG tablet Take 1 tablet by mouth 2 times daily (Patient not taking: Reported on 01/14/2024) 30 tablet 3    vitamin D  (ERGOCALCIFEROL ) 1.25 MG (50000 UT) CAPS capsule Take 1 capsule by mouth Once a week at 5 PM (Patient not taking: Reported on 01/14/2024) 12 capsule 1    [DISCONTINUED] hydroCHLOROthiazide (MICROZIDE) 12.5 MG capsule Take 1 capsule by mouth daily         Allergies:  No Known Allergies     Social History:   reports that he has quit smoking. He has never used smokeless tobacco. He reports that he does not currently use alcohol. He reports that he does not use drugs.     Family History:  family history is not on file. ,     Physical Exam:  BP 114/75   Pulse (!) 49   Temp 97.5 F (36.4 C) (Oral)   Resp 13   Ht 1.575 m (5' 2)   Wt 68.9 kg (152 lb)   SpO2 98%   BMI 27.80 kg/m     General appearance:  Appears comfortable. Well nourished  Eyes: Sclera clear, pupils equal  ENT: Moist mucus membranes, no thrush. Trachea midline.  Cardiovascular: Regular rhythm, normal S1, S2. No murmur, gallop, rub. No edema in lower extremities  Respiratory: Positive for wheezi  Gastrointestinal: Abdomen soft, non-tender, not distended, normal bowel  sounds  Musculoskeletal: No cyanosis in digits, neck supple  Neurology: Cranial nerves grossly intact. Alert and oriented in time, place and person. No speech or motor deficits  Psychiatry: Appropriate affect. Not agitated  Skin: Warm, dry, normal turgor, no rash    Labs:  CBC:   Lab Results   Component Value Date/Time    WBC 5.7 01/14/2024 02:21 PM    RBC 4.35 01/14/2024 02:21 PM    HGB 13.8 01/14/2024 02:21 PM    HCT 39.8 01/14/2024 02:21 PM    MCV 91.4 01/14/2024 02:21 PM    MCH 31.7 01/14/2024 02:21 PM    MCHC 34.7 01/14/2024 02:21 PM    RDW 14.3 01/14/2024 02:21 PM    PLT 145 01/14/2024 02:21 PM    MPV 8.7 01/14/2024 02:21 PM     BMP:  Lab Results   Component Value Date/Time    NA 141 01/14/2024 02:21 PM    K 4.2 01/14/2024 02:21 PM    CL 110 01/14/2024 02:21 PM    CO2 20 01/14/2024 02:21 PM    BUN 10 01/14/2024 02:21 PM    CREATININE 1.2 01/14/2024 02:21 PM    CALCIUM 9.1 01/14/2024 02:21 PM    GFRAA >60 12/30/2020 04:25 AM    LABGLOM 66 01/14/2024 02:21 PM    GLUCOSE 102 01/14/2024 02:21 PM       Chest Xray:   EKG:    I visualized CXR images and EKG strips     Discussed  with     Problem List  Active Problems:    * No active hospital problems. *  Resolved Problems:    * No resolved hospital problems. *        Assessment/Plan:   Shortness of breath.  Content concerning for possible asthmatic bronchitis  Has been ongoing for a month not improving.  Eval by pulmonology today.  Will admit for further evaluation we will start IV steroid IV antibiotics  Check respiratory culture sputum culture  Pulmonology consult.  Cough suppressant with multimodal.  Further recommendation to follow  CT scan pending at the time of    History of latent TB.    Essential hyper    Kimberley Blanch, MD    01/14/2024 4:05 PM

## 2024-01-14 NOTE — Plan of Care (Signed)
 Problem: Discharge Planning  Goal: Discharge to home or other facility with appropriate resources  Outcome: Progressing     Problem: Pain  Goal: Verbalizes/displays adequate comfort level or baseline comfort level  Outcome: Progressing     Problem: Safety - Adult  Goal: Free from fall injury  Outcome: Progressing     Problem: ABCDS Injury Assessment  Goal: Absence of physical injury  Outcome: Progressing

## 2024-01-14 NOTE — ED Triage Notes (Signed)
 Patient oriented to room and ED throughput process.  Safety measures with ED bed locked in lowest position and call light in reach.  Patient educated on all orders, including any medications.  Patient educated on chief complaint/symptoms. Patient encouraged to ask questions regarding care, medications or treatment plan.  Patient aware of how to reach staff with questions/concerns.

## 2024-01-14 NOTE — Progress Notes (Signed)
 Michael Knight    Date of Birth: 1956/03/07     Date of Service:  01/14/2024     Chief Complaint   Patient presents with    3 Month Follow-Up       HPI patient has been accompanied by his wife to our office today.  Nepali interpreter via video was utilized for the encounter.  Patient has been complaining of worsening shortness of breath, cough with mucoid phlegm and wheezing for close to a month.  In fact he was seen on 7/17 in the ER and was prescribed Tessalon  pearls and Pepcid .    Today he is significantly dyspneic, unable to complete sentences and has an incessant cough which is alarming to him.  Denies any fever.    No Known Allergies  Outpatient Medications Marked as Taking for the 01/14/24 encounter (Office Visit) with Lanning Laurance DEL, MD   Medication Sig Dispense Refill    traZODone  (DESYREL ) 50 MG tablet Take 1 tablet by mouth nightly 1 and 1/2 tabs (Patient taking differently: Take 1.5 tablets by mouth nightly 1 and 1/2 tabs) 45 tablet 5    Ubrogepant  (UBRELVY ) 100 MG TABS Take 100 mg by mouth daily as needed (migraine headaches) 16 tablet 5    Dextromethorphan-guaiFENesin  (CORICIDIN HBP CONGESTION/COUGH) 10-200 MG CAPS Take 1 capsule by mouth in the morning, at noon, in the evening, and at bedtime 20 capsule 0    meclizine  (ANTIVERT ) 12.5 MG tablet TAKE 1 TABLET BY MOUTH 3 TIMES A DAY AS NEEDED FOR DIZZINESS (Patient taking differently: Take 1 tablet by mouth 3 times daily as needed for Dizziness) 90 tablet 3    levothyroxine  (SYNTHROID ) 25 MCG tablet Take 1 tablet by mouth Daily (Patient not taking: Reported on 01/14/2024) 30 tablet 11    Incontinence Supply Disposable (DEPEND ADJUSTABLE UNDERWEAR) MISC 1 each by Does not apply route in the morning, at noon, and at bedtime 90 each 5    Misc. Devices (CANE) MISC 1 each by Does not apply route daily 1 each 0    albuterol  sulfate HFA (PROVENTIL ;VENTOLIN ;PROAIR ) 108 (90 Base) MCG/ACT inhaler Inhale 2 puffs into the lungs 4 times daily as needed for Wheezing 1  each 11    fluticasone -salmeterol (ADVAIR  DISKUS) 250-50 MCG/ACT AEPB diskus inhaler Inhale 1 puff into the lungs in the morning and 1 puff in the evening. 60 each 5    propranolol  (INDERAL ) 40 MG tablet TAKE 1 TABLET BY MOUTH EVERY DAY WITH BREAKFAST 30 tablet 11    pravastatin  (PRAVACHOL ) 80 MG tablet TAKE 1 TABLET BY MOUTH EVERY DAY AT NIGHT (Patient not taking: Reported on 01/14/2024) 30 tablet 11    magnesium  gluconate (MAGONATE) 500 MG tablet TAKE 1 TABLET BY MOUTH TWICE A DAY 60 tablet 5    meloxicam  (MOBIC ) 7.5 MG tablet Take 1 tablet by mouth 2 times daily (Patient not taking: Reported on 01/14/2024) 30 tablet 3    amLODIPine  (NORVASC ) 10 MG tablet Take 1 tablet by mouth daily 30 tablet 11    pantoprazole  (PROTONIX ) 40 MG tablet Take 1 tablet by mouth every morning (before breakfast) 90 tablet 1    [DISCONTINUED] fluticasone -salmeterol (ADVAIR  DISKUS) 250-50 MCG/ACT AEPB diskus inhaler Inhale 1 puff into the lungs in the morning and 1 puff in the evening. 60 each 3    topiramate  (TOPAMAX ) 100 MG tablet Take 1 tablet by mouth nightly 60 tablet 5    vitamin D  (ERGOCALCIFEROL ) 1.25 MG (50000 UT) CAPS capsule Take 1 capsule by mouth  Once a week at 5 PM (Patient not taking: Reported on 01/14/2024) 12 capsule 1       Immunization History   Administered Date(s) Administered    COVID-19, PFIZER PURPLE top, DILUTE for use, (age 36 y+), 46mcg/0.3mL 08/25/2019, 09/15/2019, 06/09/2020    Influenza, FLUAD, (age 31 y+), IM, Trivalent PF, 0.5mL 04/13/2016    Influenza, FLUBLOK, (age 69 y+), Quadv PF, 0.5mL 05/09/2019       Past Medical History:   Diagnosis Date    Asthma     Back pain     Dyslipidemia     GERD (gastroesophageal reflux disease)     Hyperlipidemia     Hypertension     Hypothyroidism      Past Surgical History:   Procedure Laterality Date    BRONCHOSCOPY N/A 10/28/2020    BRONCHOSCOPY ALVEOLAR LAVAGE performed by Laurance VEAR Leu, MD at [facility] ENDOSCOPY    ELBOW SURGERY Right     HAND SURGERY      UPPER  GASTROINTESTINAL ENDOSCOPY N/A 02/08/2018    EGD BIOPSY performed by Garnette SHAUNNA Lunger, MD at [facility] ENDOSCOPY     No family history on file.    Review of Systems:  Review of Systems   Constitutional:  Positive for fatigue. Negative for activity change, appetite change, fever and unexpected weight change.   HENT:  Positive for congestion. Negative for ear discharge, ear pain, postnasal drip, rhinorrhea, sinus pressure, sneezing, sore throat, tinnitus and voice change.    Respiratory:  Positive for cough, chest tightness, shortness of breath and wheezing. Negative for apnea, choking and stridor.    Cardiovascular:  Negative for chest pain, palpitations and leg swelling.   Gastrointestinal:  Negative for abdominal distention, abdominal pain, blood in stool, constipation, diarrhea and vomiting.   Musculoskeletal:  Negative for arthralgias, back pain and gait problem.   Skin:  Negative for color change, pallor and rash.   Allergic/Immunologic: Negative for environmental allergies.   Neurological:  Negative for dizziness, tremors, seizures, syncope, speech difficulty, weakness, light-headedness, numbness and headaches.   Hematological:  Negative for adenopathy. Does not bruise/bleed easily.   Psychiatric/Behavioral:  Negative for sleep disturbance.        Vitals:    01/14/24 1310   BP: 128/76   BP Site: Left Upper Arm   Patient Position: Sitting   BP Cuff Size: Large Adult   Pulse: 58   SpO2: 97%   Weight: 69.1 kg (152 lb 6.4 oz)   Height: 1.6 m (5' 3)          No data to display               Body mass index is 27 kg/m.     Wt Readings from Last 3 Encounters:   01/14/24 68.9 kg (152 lb)   01/14/24 69.1 kg (152 lb 6.4 oz)   12/25/23 68 kg (150 lb)     BP Readings from Last 3 Encounters:   01/14/24 116/75   01/14/24 128/76   12/25/23 128/80         Physical Exam  Constitutional:       General: He is not in acute distress.     Appearance: He is well-developed. He is not ill-appearing or diaphoretic.   HENT:      Nose: No  congestion or rhinorrhea.      Mouth/Throat:      Pharynx: No oropharyngeal exudate or posterior oropharyngeal erythema.   Cardiovascular:  Rate and Rhythm: Normal rate and regular rhythm.      Heart sounds: Normal heart sounds. No murmur heard.     No friction rub.   Pulmonary:      Effort: No respiratory distress.      Breath sounds: Wheezing present. No rhonchi or rales.   Chest:      Chest wall: No tenderness.   Abdominal:      General: There is no distension.      Palpations: There is no mass.      Tenderness: There is no abdominal tenderness. There is no guarding or rebound.   Musculoskeletal:         General: No swelling, tenderness or deformity.   Lymphadenopathy:      Cervical: No cervical adenopathy.   Skin:     Coloration: Skin is not pale.      Findings: No erythema, lesion or rash.   Neurological:      Mental Status: He is alert and oriented to person, place, and time. Mental status is at baseline.      Motor: No abnormal muscle tone.         Health Maintenance   Topic Date Due    DTaP/Tdap/Td vaccine (1 - Tdap) Never done    Pneumococcal 50+ years Vaccine (1 of 2 - PCV) Never done    Shingles vaccine (1 of 2) Never done    Respiratory Syncytial Virus (RSV) Pregnant or age 58 yrs+ (1 - Risk 60-74 years 1-dose series) Never done    AAA screen  06/12/2021    COVID-19 Vaccine (4 - 2024-25 season) 02/11/2023    Annual Wellness Visit (Medicare Advantage)  06/13/2023    Flu vaccine (1) 01/11/2024    A1C test (Diabetic or Prediabetic)  02/22/2024    Lipids  02/29/2024    Colorectal Cancer Screen  04/01/2024    Depression Screen  09/24/2024    GFR test (Diabetes, CKD 3-4, OR last GFR 15-59)  01/13/2025    Hepatitis C screen  Completed    Hepatitis A vaccine  Aged Out    Hepatitis B vaccine  Aged Out    Hib vaccine  Aged Out    Polio vaccine  Aged Out    Meningococcal (ACWY) vaccine  Aged Out    Meningococcal B vaccine  Aged Out    Diabetes screen  Discontinued    HIV screen  Discontinued    Prostate  Specific Antigen (PSA) Screening or Monitoring  Discontinued        Assessment/Plan:    Patient has had recurrent symptoms of chest pain and symptoms suggestive of asthmatic bronchitis.  Cough is persistent and is associated with shortness of breath and wheezing.  Patient states that he is now using Advair  250, 1 puff twice daily as prescribed.  Despite this patient continues to have persistent cough.    Offered patient hospitalization for IV antibiotics/steroid therapy-which should hopefully improve his cough and symptoms related to asthmatic bronchitis.    Patient will be sent over to the ER for further evaluation.  I have spoken with Dr. Policastro, ER physician.

## 2024-01-14 NOTE — ED Provider Notes (Signed)
 Emergency Department Encounter    Patient: Michael Knight  MRN: [id number]  DOB: July 12, 1955  Date of Evaluation: 01/14/2024  ED Provider:  OZELL BOAS, MD    Triage Chief Complaint:   Copd, dyspnea    HOPI:  [name] is a 68 y.o. male that presents to the ER for worsening cough congestion shortness of breath and not resolving symptoms.  Chronic COPD recently admitted within the last 4 months for negative cardiac workup.  No prior history of PE.  Positive cough congestion worsening shortness of breath    ROS - see HPI, below listed is current ROS at time of my eval:  General:  No fevers, no chills  Eyes:  No recent vison changes, no discharge  ENT:  No sore throat, no nasal congestion, no hearing changes  Cardiovascular:  No chest pain, no palpitations  Respiratory:  + shortness of breath, + cough, + wheezing  Gastrointestinal:  No pain, no nausea, no vomiting, no diarrhea  Musculoskeletal:  No muscle pain, no joint pain  Skin:  No rash, no pruritis  Neurologic:  No speech problems, no headache  Psychiatric:  No anxiety  Genitourinary:  No dysuria, no hematuria  Endocrine:  No unexpected weight gain, no unexpected weight loss  Extremities:  no edema, no pain      Past Medical History:   Diagnosis Date    Asthma     Back pain     Dyslipidemia     GERD (gastroesophageal reflux disease)     Hyperlipidemia     Hypertension     Hypothyroidism      Past Surgical History:   Procedure Laterality Date    BRONCHOSCOPY N/A 10/28/2020    BRONCHOSCOPY ALVEOLAR LAVAGE performed by Laurance VEAR Leu, MD at [facility] ENDOSCOPY    ELBOW SURGERY Right     HAND SURGERY      UPPER GASTROINTESTINAL ENDOSCOPY N/A 02/08/2018    EGD BIOPSY performed by Garnette SHAUNNA Lunger, MD at [facility] ENDOSCOPY     History reviewed. No pertinent family history.  Social History     Socioeconomic History    Marital status: Unknown     Spouse name: Not on file    Number of children: Not on file    Years of education: Not on file    Highest education  level: Not on file   Occupational History    Not on file   Tobacco Use    Smoking status: Former    Smokeless tobacco: Never   Vaping Use    Vaping status: Never Used   Substance and Sexual Activity    Alcohol use: Not Currently    Drug use: Never    Sexual activity: Not Currently   Other Topics Concern    Not on file   Social History Narrative    Not on file     Social Drivers of Health     Financial Resource Strain: Low Risk  (02/22/2023)    Overall Financial Resource Strain (CARDIA)     Difficulty of Paying Living Expenses: Not hard at all   Food Insecurity: No Food Insecurity (12/25/2023)    Hunger Vital Sign     Worried About Running Out of Food in the Last Year: Never true     Ran Out of Food in the Last Year: Never true   Transportation Needs: No Transportation Needs (12/25/2023)    PRAPARE - Therapist, art (Medical): No  Lack of Transportation (Non-Medical): No   Physical Activity: Sufficiently Active (03/01/2023)    Exercise Vital Sign     Days of Exercise per Week: 7 days     Minutes of Exercise per Session: 30 min   Stress: Not on file   Social Connections: Not on file   Intimate Partner Violence: Not on file   Housing Stability: Low Risk  (12/25/2023)    Housing Stability Vital Sign     Unable to Pay for Housing in the Last Year: No     Number of Times Moved in the Last Year: 0     Homeless in the Last Year: No     Current Facility-Administered Medications   Medication Dose Route Frequency Provider Last Rate Last Admin    doxycycline  hyclate (VIBRA -TABS) tablet 100 mg  100 mg Oral 2 times per day Tylique Aull, MD        ipratropium 0.5 mg-albuterol  2.5 mg (DUONEB ) nebulizer solution 1 Dose  1 Dose Inhalation Q4H RT Saniyyah Elster, MD         Current Outpatient Medications   Medication Sig Dispense Refill    levothyroxine  (SYNTHROID ) 75 MCG tablet Take 1 tablet by mouth Daily      pravastatin  (PRAVACHOL ) 40 MG tablet Take 1 tablet by mouth nightly      tamsulosin   (FLOMAX ) 0.4 MG capsule Take 1 capsule by mouth nightly      famotidine  (PEPCID ) 20 MG tablet Take 1 tablet by mouth 2 times daily      traZODone  (DESYREL ) 50 MG tablet Take 1 tablet by mouth nightly 1 and 1/2 tabs (Patient taking differently: Take 1.5 tablets by mouth nightly 1 and 1/2 tabs) 45 tablet 5    Ubrogepant  (UBRELVY ) 100 MG TABS Take 100 mg by mouth daily as needed (migraine headaches) 16 tablet 5    Dextromethorphan-guaiFENesin  (CORICIDIN HBP CONGESTION/COUGH) 10-200 MG CAPS Take 1 capsule by mouth in the morning, at noon, in the evening, and at bedtime 20 capsule 0    meclizine  (ANTIVERT ) 12.5 MG tablet TAKE 1 TABLET BY MOUTH 3 TIMES A DAY AS NEEDED FOR DIZZINESS (Patient taking differently: Take 1 tablet by mouth 3 times daily as needed for Dizziness) 90 tablet 3    albuterol  sulfate HFA (PROVENTIL ;VENTOLIN ;PROAIR ) 108 (90 Base) MCG/ACT inhaler Inhale 2 puffs into the lungs 4 times daily as needed for Wheezing 1 each 11    fluticasone -salmeterol (ADVAIR  DISKUS) 250-50 MCG/ACT AEPB diskus inhaler Inhale 1 puff into the lungs in the morning and 1 puff in the evening. 60 each 5    propranolol  (INDERAL ) 40 MG tablet TAKE 1 TABLET BY MOUTH EVERY DAY WITH BREAKFAST 30 tablet 11    magnesium  gluconate (MAGONATE) 500 MG tablet TAKE 1 TABLET BY MOUTH TWICE A DAY 60 tablet 5    amLODIPine  (NORVASC ) 10 MG tablet Take 1 tablet by mouth daily 30 tablet 11    pantoprazole  (PROTONIX ) 40 MG tablet Take 1 tablet by mouth every morning (before breakfast) 90 tablet 1    topiramate  (TOPAMAX ) 100 MG tablet Take 1 tablet by mouth nightly 60 tablet 5    Incontinence Supply Disposable (DEPEND ADJUSTABLE UNDERWEAR) MISC 1 each by Does not apply route in the morning, at noon, and at bedtime 90 each 5    Misc. Devices (CANE) MISC 1 each by Does not apply route daily 1 each 0    meloxicam  (MOBIC ) 7.5 MG tablet Take 1 tablet by mouth 2 times daily (Patient not taking:  Reported on 01/14/2024) 30 tablet 3     No Known  Allergies    Nursing Notes Reviewed    Physical Exam:  Triage VS:    ED Triage Vitals   Encounter Vitals Group      BP       Systolic BP Percentile       Diastolic BP Percentile       Pulse       Resp       Temp       Temp src       SpO2       Weight       Height       Head Circumference       Peak Flow       Pain Score       Pain Loc       Pain Education       Exclude from Growth Chart         My pulse ox interpretation is - normal    General appearance:  No acute distress  Skin:  Warm. Dry. No pallor. No rash.    Eye:  Normal conjuctiva.  no Icterus.     Ears, nose, mouth and throat:  Oral mucosa moist   Heart:  Regular rate and rhythm, normal S1 & S2, no extra heart sounds, no murmurs.    Perfusion:  intact  Respiratory:  inspiratory and expiratory wheezes in all lung fields, decreased air entry throughout, mild tachypnea noted, no accessory muscle use  Abdominal:  Soft.  Nontender.  Non distended.     Extremity:  No edema or tenderness  Neurological:  Alert and oriented,  No focal neuro deficits.       I have reviewed and interpreted all of the currently available lab results from this visit (if applicable):  Results for orders placed or performed during the hospital encounter of 01/14/24   CBC with Auto Differential   Result Value Ref Range    WBC 5.7 4.0 - 11.0 K/uL    RBC 4.35 4.20 - 5.90 M/uL    Hemoglobin 13.8 13.5 - 17.5 g/dL    Hematocrit 60.1 (L) 40.5 - 52.5 %    MCV 91.4 80.0 - 100.0 fL    MCH 31.7 26.0 - 34.0 pg    MCHC 34.7 31.0 - 36.0 g/dL    RDW 85.6 87.5 - 84.5 %    Platelets 145 135 - 450 K/uL    MPV 8.7 5.0 - 10.5 fL    Neutrophils % 64.4 %    Lymphocytes % 25.5 %    Monocytes % 7.7 %    Eosinophils % 1.4 %    Basophils % 1.0 %    Neutrophils Absolute 3.7 1.7 - 7.7 K/uL    Lymphocytes Absolute 1.5 1.0 - 5.1 K/uL    Monocytes Absolute 0.4 0.0 - 1.3 K/uL    Eosinophils Absolute 0.1 0.0 - 0.6 K/uL    Basophils Absolute 0.1 0.0 - 0.2 K/uL   BMP w/ Reflex to MG   Result Value Ref Range    Sodium 141  136 - 145 mmol/L    Potassium reflex Magnesium  4.2 3.5 - 5.1 mmol/L    Chloride 110 99 - 110 mmol/L    CO2 20 (L) 21 - 32 mmol/L    Anion Gap 11 3 - 16    Glucose 102 (H) 70 - 99 mg/dL    BUN 10 7 - 20 mg/dL  Creatinine 1.2 0.8 - 1.3 mg/dL    Est, Glom Filt Rate 66 >60    Calcium 9.1 8.3 - 10.6 mg/dL   Blood gas, venous   Result Value Ref Range    pH, Ven 7.353 7.350 - 7.450    pCO2, Ven 38.8 (L) 40.0 - 50.0 mmHg    PO2, Ven 117.0 (H) 25.0 - 40.0 mmHg    HCO3, Venous 21.5 (L) 23.0 - 29.0 mmol/L    Base Excess, Ven -3.7 (L) -3.0 - 3.0 mmol/L    O2 Sat, Ven 99 Not Established %    Carboxyhemoglobin 2.1 (H) 0.0 - 1.5 %    MetHgb, Ven 0.8 <1.5 %    TC02 (Calc), Ven 51 Not Established mmol/L    O2 Content, Ven 19 Not Established VOL %    O2 Therapy Unknown    Brain Natriuretic Peptide   Result Value Ref Range    NT Pro-BNP 153 (H) 0 - 124 pg/mL   Troponin   Result Value Ref Range    Troponin, High Sensitivity <6 0 - 22 ng/L   EKG 12 Lead   Result Value Ref Range    Ventricular Rate 51 BPM    Atrial Rate 51 BPM    P-R Interval 186 ms    QRS Duration 82 ms    Q-T Interval 436 ms    QTc Calculation (Bazett) 401 ms    P Axis 34 degrees    R Axis -10 degrees    T Axis 5 degrees    Diagnosis Sinus bradycardiaOtherwise normal ECG       Radiographs (if obtained):  Radiologist's Report Reviewed:  No results found.  EKG (if obtained): (All EKG's are interpreted by myself in the absence of a cardiologist)      MDM:  Patient presenting to the emergency department with likely reactive airway disease exacerbation.  Patient presents ER for evaluation of COPD exacerbation, no evidence of PE.  No troponin leak.  Case discussed with his treating pulmonologist admitting hospitalist physician and chart reviewed.  Positive steroids beta agonist.  Oral doxycycline   Clinical Impression:  1. Pulmonary emphysema, unspecified emphysema type (HCC)    2. Cough, unspecified type      Disposition referral (if applicable):  No follow-up provider  specified.  Disposition medications (if applicable):  New Prescriptions    No medications on file       Comment: Please note this report has been produced using speech recognition software and may contain errors related to that system including errors in grammar, punctuation, and spelling, as well as words and phrases that may be inappropriate.  Efforts were made to edit the dictations.       Endya Austin, MD  01/14/24 [phone]

## 2024-01-14 NOTE — Consults (Signed)
 Session ID: [id number]  Session Duration: 5 minutes  Language: Nepali  Interpreter ID: #[id number]  Interpreter Name: Callie

## 2024-01-14 NOTE — Progress Notes (Signed)
 Pharmacy Home Medication Reconciliation Note    A medication reconciliation has been completed for SHAFER SWAMY 08-09-1955    Pharmacy: [facility] [address] [place], [place], [place]  Information provided by: patient and daughter    The patient's home medication list is as follows:  No current facility-administered medications on file prior to encounter.     Current Outpatient Medications on File Prior to Encounter   Medication Sig Dispense Refill    levothyroxine  (SYNTHROID ) 75 MCG tablet Take 1 tablet by mouth Daily      pravastatin  (PRAVACHOL ) 40 MG tablet Take 1 tablet by mouth nightly      tamsulosin  (FLOMAX ) 0.4 MG capsule Take 1 capsule by mouth nightly      famotidine  (PEPCID ) 20 MG tablet Take 1 tablet by mouth 2 times daily      traZODone  (DESYREL ) 50 MG tablet Take 1 tablet by mouth nightly 1 and 1/2 tabs (Patient taking differently: Take 1.5 tablets by mouth nightly 1 and 1/2 tabs) 45 tablet 5    Ubrogepant  (UBRELVY ) 100 MG TABS Take 100 mg by mouth daily as needed (migraine headaches) 16 tablet 5    Dextromethorphan-guaiFENesin  (CORICIDIN HBP CONGESTION/COUGH) 10-200 MG CAPS Take 1 capsule by mouth in the morning, at noon, in the evening, and at bedtime 20 capsule 0    meclizine  (ANTIVERT ) 12.5 MG tablet TAKE 1 TABLET BY MOUTH 3 TIMES A DAY AS NEEDED FOR DIZZINESS (Patient taking differently: Take 1 tablet by mouth 3 times daily as needed for Dizziness) 90 tablet 3    albuterol  sulfate HFA (PROVENTIL ;VENTOLIN ;PROAIR ) 108 (90 Base) MCG/ACT inhaler Inhale 2 puffs into the lungs 4 times daily as needed for Wheezing 1 each 11    fluticasone -salmeterol (ADVAIR  DISKUS) 250-50 MCG/ACT AEPB diskus inhaler Inhale 1 puff into the lungs in the morning and 1 puff in the evening. 60 each 5    propranolol  (INDERAL ) 40 MG tablet TAKE 1 TABLET BY MOUTH EVERY DAY WITH BREAKFAST 30 tablet 11    magnesium  gluconate (MAGONATE) 500 MG tablet TAKE 1 TABLET BY MOUTH TWICE A DAY 60 tablet 5    amLODIPine  (NORVASC ) 10 MG  tablet Take 1 tablet by mouth daily 30 tablet 11    pantoprazole  (PROTONIX ) 40 MG tablet Take 1 tablet by mouth every morning (before breakfast) 90 tablet 1    topiramate  (TOPAMAX ) 100 MG tablet Take 1 tablet by mouth nightly 60 tablet 5    levothyroxine  (SYNTHROID ) 25 MCG tablet Take 1 tablet by mouth Daily (Patient not taking: Reported on 01/14/2024) 30 tablet 11    Incontinence Supply Disposable (DEPEND ADJUSTABLE UNDERWEAR) MISC 1 each by Does not apply route in the morning, at noon, and at bedtime 90 each 5    Misc. Devices (CANE) MISC 1 each by Does not apply route daily 1 each 0    pravastatin  (PRAVACHOL ) 80 MG tablet TAKE 1 TABLET BY MOUTH EVERY DAY AT NIGHT (Patient not taking: Reported on 01/14/2024) 30 tablet 11    meloxicam  (MOBIC ) 7.5 MG tablet Take 1 tablet by mouth 2 times daily (Patient not taking: Reported on 01/14/2024) 30 tablet 3    [DISCONTINUED] fluticasone -salmeterol (ADVAIR  DISKUS) 250-50 MCG/ACT AEPB diskus inhaler Inhale 1 puff into the lungs in the morning and 1 puff in the evening. 60 each 3    vitamin D  (ERGOCALCIFEROL ) 1.25 MG (50000 UT) CAPS capsule Take 1 capsule by mouth Once a week at 5 PM (Patient not taking: Reported on 01/14/2024) 12 capsule 1    [  DISCONTINUED] hydroCHLOROthiazide (MICROZIDE) 12.5 MG capsule Take 1 capsule by mouth daily         Patient is no longer taking Meloxicam  or Vitamin D .    Of note, patient took all AM meds prior to ED arrival.    Timing of last doses updated.    Thank you,  Corean A. Wagers, CPhT

## 2024-01-14 NOTE — Progress Notes (Signed)
 Patient admitted for Respiratory distress due to a Respiratory illness, aerosolized medication frequency changed to Q4H x 24 hours, patient to be reassessed 01/15/24 @ 1500.

## 2024-01-15 LAB — EKG 12-LEAD
Atrial Rate: 51 {beats}/min
P Axis: 34 degrees
P-R Interval: 186 ms
Q-T Interval: 436 ms
QRS Duration: 82 ms
QTc Calculation (Bazett): 401 ms
R Axis: -10 degrees
T Axis: 5 degrees
Ventricular Rate: 51 {beats}/min

## 2024-01-15 LAB — STREP PNEUMONIAE ANTIGEN: STREP PNEUMONIAE ANTIGEN, URINE: NEGATIVE

## 2024-01-15 LAB — RESPIRATORY PANEL, MOLECULAR, WITH COVID-19: Respiratory Panel PCR: NOT DETECTED

## 2024-01-15 LAB — LEGIONELLA ANTIGEN, URINE: L. pneumophila Serogp 1 Ur Ag: NEGATIVE

## 2024-01-15 LAB — RESPIRATORY PANEL FILM ARRAY REPORT

## 2024-01-15 MED ORDER — IPRATROPIUM-ALBUTEROL 0.5-2.5 (3) MG/3ML IN SOLN
0.5-2.5 | RESPIRATORY_TRACT | Status: DC | PRN
Start: 2024-01-15 — End: 2024-01-18
  Administered 2024-01-16 (×2): 1 via RESPIRATORY_TRACT

## 2024-01-15 MED ORDER — IPRATROPIUM-ALBUTEROL 0.5-2.5 (3) MG/3ML IN SOLN
0.5-2.5 | Freq: Two times a day (BID) | RESPIRATORY_TRACT | Status: DC
Start: 2024-01-15 — End: 2024-01-16
  Administered 2024-01-16 (×2): 1 via RESPIRATORY_TRACT

## 2024-01-15 MED ORDER — GUAIFENESIN ER 600 MG PO TB12
600 | Freq: Two times a day (BID) | ORAL | Status: DC
Start: 2024-01-15 — End: 2024-01-18
  Administered 2024-01-15 – 2024-01-18 (×7): 600 mg via ORAL

## 2024-01-15 MED ORDER — PHENOL 1.4 % MT LIQD
1.4 | OROMUCOSAL | Status: DC | PRN
Start: 2024-01-15 — End: 2024-01-18
  Administered 2024-01-15: 11:00:00 1 via OROMUCOSAL

## 2024-01-15 MED ORDER — PANTOPRAZOLE SODIUM 40 MG IV SOLR
40 | Freq: Two times a day (BID) | INTRAVENOUS | Status: DC
Start: 2024-01-15 — End: 2024-01-18
  Administered 2024-01-16 – 2024-01-18 (×6): 40 mg via INTRAVENOUS

## 2024-01-15 MED FILL — BENZONATATE 100 MG PO CAPS: 100 mg | ORAL | Qty: 1 | Fill #0

## 2024-01-15 MED FILL — FAMOTIDINE 20 MG PO TABS: 20 mg | ORAL | Qty: 1 | Fill #0

## 2024-01-15 MED FILL — METHYLPREDNISOLONE SODIUM SUCC 40 MG IJ SOLR: 40 mg | INTRAMUSCULAR | Qty: 40 | Fill #0

## 2024-01-15 MED FILL — UBRELVY 100 MG PO TABS: 100 mg | ORAL | Qty: 1 | Fill #0

## 2024-01-15 MED FILL — PANTOPRAZOLE SODIUM 40 MG PO TBEC: 40 mg | ORAL | Qty: 1 | Fill #0

## 2024-01-15 MED FILL — MUCINEX 600 MG PO TB12: 600 mg | ORAL | Qty: 1 | Fill #0

## 2024-01-15 MED FILL — DOXYCYCLINE HYCLATE 100 MG PO TABS: 100 mg | ORAL | Qty: 1 | Fill #0

## 2024-01-15 MED FILL — IPRATROPIUM-ALBUTEROL 0.5-2.5 (3) MG/3ML IN SOLN: 0.5-2.5 (3) MG/3ML | RESPIRATORY_TRACT | Qty: 6 | Fill #0

## 2024-01-15 MED FILL — IPRATROPIUM-ALBUTEROL 0.5-2.5 (3) MG/3ML IN SOLN: 0.5-2.5 (3) MG/3ML | RESPIRATORY_TRACT | Qty: 3 | Fill #0

## 2024-01-15 MED FILL — LEVOTHYROXINE SODIUM 75 MCG PO TABS: 75 ug | ORAL | Qty: 1 | Fill #0

## 2024-01-15 MED FILL — LOVENOX 40 MG/0.4ML IJ SOSY: 40 MG/0.4ML | INTRAMUSCULAR | Qty: 0.4 | Fill #0

## 2024-01-15 MED FILL — AMLODIPINE BESYLATE 5 MG PO TABS: 5 mg | ORAL | Qty: 2 | Fill #0

## 2024-01-15 MED FILL — CHLORASEPTIC 1.4 % MT LIQD: 1.4 % | OROMUCOSAL | Qty: 177 | Fill #0

## 2024-01-15 MED FILL — MUCUS RELIEF DM 30-600 MG PO TB12: 30-600 mg | ORAL | Qty: 1 | Fill #0

## 2024-01-15 MED FILL — PROPRANOLOL HCL 20 MG PO TABS: 20 mg | ORAL | Qty: 2 | Fill #0

## 2024-01-15 NOTE — Plan of Care (Signed)
 Problem: Discharge Planning  Goal: Discharge to home or other facility with appropriate resources  Outcome: Progressing     Problem: Pain  Goal: Verbalizes/displays adequate comfort level or baseline comfort level  Outcome: Progressing     Problem: Safety - Adult  Goal: Free from fall injury  Outcome: Progressing     Problem: ABCDS Injury Assessment  Goal: Absence of physical injury  Outcome: Progressing     Problem: Hematologic - Adult  Goal: Maintains hematologic stability  01/15/2024 1013 by Kateri Raisin, RN  Outcome: Progressing  01/14/2024 2144 by Peggi Kaufmann, RN  Outcome: Progressing

## 2024-01-15 NOTE — Progress Notes (Signed)
 THIS IS A MEDICAL STUDENT NOTE  THIS IS FOR LEARNING PURPOSES ONLY  PLEASE SEE ATTENDING PHYSICIAN NOTE FOR COMPLETE DOCUMENTATION    Name:  Michael Knight    DOB/Age/Sex: [birth year]  (68 y.o. male)  MRN & CSN:  [id number] & [id number]    PCP: Vannie Cara KIDD, MD    Date of Admission: 01/14/2024    Patient Status:  Inpatient     Chief Complaint:   Chief Complaint   Patient presents with    Chest Pain     Chest pain and cough x 15 days. Sent by PCP.        Hospital Course:   Patient is a 68 year-old male with a past medical history of Hypertension, COPD, CAD, Hyperlipidemia, and latent TB. He presented to the ER with increasing productive cough, dyspnea, and chest pain. Vitals at the time of admission were notable for SpO2 of 94% on room air. Viral panel at the time of presentation was negative. CT with contrast of the chest was negative for pulmonary embolism and acute pulmonary process.     Subjective:  Today is:  Hospital Day: 2.  Patient seen and examined in [id number].     Patient reports that he feels better today. He still complains of chest pain that is substernal and non-radiating in nature and tender to palpation. Patient aslo complains of residue throat soreness that has improved since yesterday. Patient denies any dyspnea, coughing, and wheezing.      Medications:  Reviewed    Infusion Medications    sodium chloride        Scheduled Medications    pantoprazole  (PROTONIX ) 40 mg in sodium chloride  (PF) 0.9 % 10 mL injection  40 mg IntraVENous Q12H    guaiFENesin   600 mg Oral BID    ipratropium 0.5 mg-albuterol  2.5 mg  1 Dose Inhalation Q4H RT    sodium chloride  flush  5-40 mL IntraVENous 2 times per day    enoxaparin   40 mg SubCUTAneous Daily    doxycycline  hyclate  100 mg Oral 2 times per day    methylPREDNISolone   40 mg IntraVENous Q12H    amLODIPine   10 mg Oral Daily    budesonide -formoterol   2 puff Inhalation BID RT    levothyroxine   75 mcg Oral Daily    pravastatin   40 mg Oral Nightly    propranolol   40  mg Oral Daily with breakfast    tamsulosin   0.4 mg Oral Nightly    topiramate   100 mg Oral Nightly    traZODone   75 mg Oral Nightly     PRN Meds: phenol, sodium chloride  flush, sodium chloride , potassium chloride  **OR** potassium alternative oral replacement **OR** potassium chloride , magnesium  sulfate, polyethylene glycol, acetaminophen  **OR** acetaminophen , ondansetron  **OR** ondansetron , dextromethorphan-guaiFENesin , benzonatate , albuterol  sulfate HFA, Ubrogepant       Intake/Output Summary (Last 24 hours) at 01/15/2024 1114  Last data filed at 01/15/2024 0900  Gross per 24 hour   Intake 250 ml   Output 500 ml   Net -250 ml       Physical Exam Performed:    BP 121/68   Pulse 63   Temp 97.5 F (36.4 C) (Oral)   Resp 24   Ht 1.575 m (5' 2)   Wt 69.5 kg (153 lb 3.2 oz)   SpO2 97%   BMI 28.02 kg/m     General appearance: No apparent distress, appears stated age and cooperative.   HEENT: Pupils equal, round, and reactive to light. Conjunctivae/corneas  clear.  Neck: Supple, with full range of motion. No jugular venous distention. Trachea midline.  Respiratory:  Normal respiratory effort. Clear to auscultation, bilaterally without Rales/Wheezes/Rhonchi.  Cardiovascular: Regular rate and rhythm with normal S1/S2 without murmurs, rubs or gallops. No peripheral edema.  Abdomen: Soft, non-tender, non-distended with normal bowel sounds.  GU: No CVA tenderness.  Musculoskeletal: No clubbing or cyanosis.  Full range of motion without deformity.  Skin: Skin color, texture, turgor normal.  No rashes or lesions.  Neurologic:  Neurovascularly intact without any focal sensory/motor deficits. Cranial nerves: II-XII intact, grossly non-focal.  Psychiatric: Alert and oriented, thought content appropriate, normal insight  Capillary Refill: Brisk, 3 seconds, normal   Peripheral Pulses: +2 palpable, equal bilaterally       Labs:   Recent Labs     01/14/24  1421   WBC 5.7   HGB 13.8   HCT 39.8*   PLT 145     Recent Labs      01/14/24  1421   NA 141   K 4.2   CL 110   CO2 20*   BUN 10   CREATININE 1.2   CALCIUM 9.1     No results for input(s): AST, ALT, BILIDIR, BILITOT, ALKPHOS in the last 72 hours.  No results for input(s): INR in the last 72 hours.  No results for input(s): CKTOTAL, TROPONINI in the last 72 hours.    Urinalysis:      Lab Results   Component Value Date/Time    NITRU Negative 12/28/2020 05:00 PM    BLOODU Negative 12/28/2020 05:00 PM    GLUCOSEU Negative 12/28/2020 05:00 PM       Radiology:  CT CHEST PULMONARY EMBOLISM W CONTRAST   Final Result   No evidence of pulmonary embolism or acute pulmonary abnormality.                 Assessment/Plan:    Active Hospital Problems    Diagnosis     COPD exacerbation [facility]) [J44.1]          Hospital Day: 2    This is a 68 y.o. male who presented to [facility] on 01/14/2024 and is being treated for:    Acute COPD exacerbation  Being treat with  Doxycycline  100 mg BID  Duoneb  every 4 hours  Methylprednisolone  40 mg IV BID  Symbicort  BID  Pulmonology consulted  Respiratory viral panel, pneumonia panel, and legionella antigen negative  Continue telemetry  Continue SpO2 monitoring  Plan to transition to oral steroids x 4 days in preparation of discharge    Hypertension  Continue Norvasc             DVT ppx: Lovenox   GI ppx: Diet/Tube Feeds  Diet: ADULT DIET; Regular  Code Status: Full Code    PT/OT Eval Status: Not indicated    Disposition:   Anticipate discharge in 24-48 hours          Curtistine JINNY Scudder  01/15/2024  11:14 AM

## 2024-01-15 NOTE — Consults (Signed)
 Gastroenterology Consult Note        Patient: Michael Knight  DOB: 1956/04/22  Acct#:      Date:  01/15/2024      1. Pulmonary emphysema, unspecified emphysema type (HCC)    2. Cough, unspecified type        Subjective:       History of Present Illness  Patient is a 68 y.o.  male admitted with COPD exacerbation (HCC) [J44.1]  Pulmonary emphysema, unspecified emphysema type (HCC) [J43.9]  Cough, unspecified type [R05.9] who is seen in consult for GERD.   H/o COPD, latent TB, HTN, hypothyroidism.   Has had worsening SOB, mucoid phlegm, and wheezing for a month. Saw his pulmonologist and was sent to the ED.   Has had regurgitation for a long time. Takes pantoprazole  daily at home. Coughs during interview.   Having BMs ok.     Past Medical History:   Diagnosis Date    Asthma     Back pain     Dyslipidemia     GERD (gastroesophageal reflux disease)     Hyperlipidemia     Hypertension     Hypothyroidism     TB (tuberculosis)       Past Surgical History:   Procedure Laterality Date    BRONCHOSCOPY N/A 10/28/2020    BRONCHOSCOPY ALVEOLAR LAVAGE performed by Laurance VEAR Leu, MD at [facility] END[facility]COPY    ELBOW SURGERY Right     HAND SURGERY      UPPER GASTROINTESTINAL ENDOSCOPY N/A 02/08/2018    EGD BIOPSY performed by Garnette SHAUNNA Lunger, MD at [facility] ENDOSCOPY      Past Endoscopic History    Reports colonoscopy in 2023 - not in chart here    EGD 2019 with Dr Lunger for epigastric pain    Impression:                Multiple small gastric antral ulcers - likely cause of pain.  Biopsies obtained       Admission Meds  No current facility-administered medications on file prior to encounter.     Current Outpatient Medications on File Prior to Encounter   Medication Sig Dispense Refill    levothyroxine  (SYNTHROID ) 75 MCG tablet Take 1 tablet by mouth Daily      pravastatin  (PRAVACHOL ) 40 MG tablet Take 1 tablet by mouth nightly      tamsulosin  (FLOMAX ) 0.4 MG capsule Take 1 capsule by mouth nightly      famotidine   (PEPCID ) 20 MG tablet Take 1 tablet by mouth 2 times daily      traZODone  (DESYREL ) 50 MG tablet Take 1 tablet by mouth nightly 1 and 1/2 tabs (Patient taking differently: Take 1.5 tablets by mouth nightly 1 and 1/2 tabs) 45 tablet 5    Ubrogepant  (UBRELVY ) 100 MG TABS Take 100 mg by mouth daily as needed (migraine headaches) 16 tablet 5    Dextromethorphan-guaiFENesin  (CORICIDIN HBP CONGESTION/COUGH) 10-200 MG CAPS Take 1 capsule by mouth in the morning, at noon, in the evening, and at bedtime 20 capsule 0    meclizine  (ANTIVERT ) 12.5 MG tablet TAKE 1 TABLET BY MOUTH 3 TIMES A DAY AS NEEDED FOR DIZZINESS (Patient taking differently: Take 1 tablet by mouth 3 times daily as needed for Dizziness) 90 tablet 3    albuterol  sulfate HFA (PROVENTIL ;VENTOLIN ;PROAIR ) 108 (90 Base) MCG/ACT inhaler Inhale 2 puffs into the lungs 4 times daily as needed for Wheezing 1 each 11    fluticasone -salmeterol (ADVAIR   DISKUS) 250-50 MCG/ACT AEPB diskus inhaler Inhale 1 puff into the lungs in the morning and 1 puff in the evening. 60 each 5    propranolol  (INDERAL ) 40 MG tablet TAKE 1 TABLET BY MOUTH EVERY DAY WITH BREAKFAST 30 tablet 11    magnesium  gluconate (MAGONATE) 500 MG tablet TAKE 1 TABLET BY MOUTH TWICE A DAY 60 tablet 5    amLODIPine  (NORVASC ) 10 MG tablet Take 1 tablet by mouth daily 30 tablet 11    pantoprazole  (PROTONIX ) 40 MG tablet Take 1 tablet by mouth every morning (before breakfast) 90 tablet 1    topiramate  (TOPAMAX ) 100 MG tablet Take 1 tablet by mouth nightly 60 tablet 5    Incontinence Supply Disposable (DEPEND ADJUSTABLE UNDERWEAR) MISC 1 each by Does not apply route in the morning, at noon, and at bedtime 90 each 5    Misc. Devices (CANE) MISC 1 each by Does not apply route daily 1 each 0    meloxicam  (MOBIC ) 7.5 MG tablet Take 1 tablet by mouth 2 times daily (Patient not taking: Reported on 01/14/2024) 30 tablet 3    [DISCONTINUED] hydroCHLOROthiazide (MICROZIDE) 12.5 MG capsule Take 1 capsule by mouth daily               Allergies  No Known Allergies   Social   Social History     Tobacco Use    Smoking status: Former    Smokeless tobacco: Never   Substance Use Topics    Alcohol use: Not Currently        History reviewed. No pertinent family history.               Physical Exam  Blood pressure 125/81, pulse 69, temperature 98 F (36.7 C), temperature source Oral, resp. rate 18, height 1.575 m (5' 2), weight 69.5 kg (153 lb 3.2 oz), SpO2 97%.    General appearance: alert, cooperative, no distress, appears stated age, coughs during interview  Eyes: Anicteric  Head: Normocephalic, without obvious abnormality  Lungs: clear to auscultation bilaterally, Normal Effort  Heart: regular rate and rhythm, normal S1 and S2, no murmurs or rubs  Abdomen: soft, non-tender. Bowel sounds normal. No masses,  no organomegaly.   Extremities: atraumatic, no cyanosis or edema  Skin: warm and dry, no jaundice  Neuro: Grossly intact, A&OX3  Musculoskeletal: 5/5 grip strength BUE      Data Review:    Recent Labs     01/14/24  1421   WBC 5.7   HGB 13.8   HCT 39.8*   MCV 91.4   PLT 145     Recent Labs     01/14/24  1421   NA 141   K 4.2   CL 110   CO2 20*   BUN 10   CREATININE 1.2     No results for input(s): AST, ALT, BILIDIR, BILITOT, ALKPHOS in the last 72 hours.    Invalid input(s): ALB  No results for input(s): LIPASE, AMYLASE in the last 72 hours.  No results for input(s): PROTIME, INR in the last 72 hours.  Invalid input(s): PTT  No results for input(s): OCCULTBLD in the last 72 hours.    Imaging Studies:                            CT chest PE protocol 01/14/24    IMPRESSION:  No evidence of pulmonary embolism or acute pulmonary abnormality.  Assessment/Plan:     GERD- has symptoms despite daily protonix .   - increase PPI to BID  - npo midnight  - plan EGD tomorrow    The above assessment and plan was developed in collaboration with Dr. Juluri  Margrit Minner, PA-C  [facility]

## 2024-01-15 NOTE — Plan of Care (Signed)
 Problem: Hematologic - Adult  Goal: Maintains hematologic stability  Outcome: Progressing      01/15/24 0638   Vitals   Temp 97.4 F (36.3 C)   Temp Source Oral   Pulse 70   Heart Rate Source Brachial;Radial   Respirations 20   BP 112/70   MAP (Calculated) 84   BP Location Left upper arm   BP Upper/Lower Upper   BP Method Automatic   Patient Position Sitting   Cardiac Rhythm Sinus rhythm   Pain Assessment   Pain Assessment None - Denies Pain   Pain Level 0   Opioid-Induced Sedation   POSS Score 1   Oxygen Therapy   SpO2 94 %   Pulse Oximeter Device Mode Intermittent   Pulse Oximeter Device Location Finger   O2 Device None (Room air)

## 2024-01-15 NOTE — Plan of Care (Signed)
 Problem: Hematologic - Adult  Goal: Maintains hematologic stability  Outcome: Progressing      01/15/24 0317   Vitals   Temp 97 F (36.1 C)   Temp Source Temporal   Pulse 54  (HUC had called for HR 30s;VS checked now)   Heart Rate Source Radial;Brachial   Respirations 20   BP (!) 96/51   MAP (Calculated) 66   BP Location Left upper arm   BP Upper/Lower Upper   BP Method Automatic   Patient Position Turns self   Cardiac Rhythm Sinus brady   Pain Assessment   Pain Assessment None - Denies Pain   Pain Level 0   Opioid-Induced Sedation   POSS Score S   Oxygen Therapy   SpO2 96 %   Pulse Oximeter Device Mode Intermittent   Pulse Oximeter Device Location Finger   O2 Device None (Room air)

## 2024-01-15 NOTE — Progress Notes (Signed)
 Surgical consent obtained and is in chart.

## 2024-01-15 NOTE — Progress Notes (Signed)
 V2.0    [facility] Progress Note      Name:  Michael Knight DOB/Age/Sex: 10-16-1955  (68 y.o. male)   MRN & CSN:  [id number] & [id number] Encounter Date/Time: 01/15/2024 12:38 PM EDT   Location:  [id number]-3325/3325-01 PCP: Vannie Cara KIDD, MD     Attending:Eddison Searls, MD       Hospital Day: 2    Assessment and Recommendations   Michael Knight is a 68 y.o. male who presents with COPD exacerbation (HCC)      Plan:   Shortness of breath with chronic cough.  Some concern for COPD exacerbation with no wheezing.  CTPA negative.  No infiltrate  Continue steroid breathing treatment  She does complains of some dysphagia slight food getting stuck.  May have some bad GERD reflux as well  Consult GI.  Appreciate pulmonary recommendation as well  Monitor closely    PPI empirically      Diet ADULT DIET; Regular  Diet NPO Exceptions are: Sips of Water  with Meds   DVT Prophylaxis    Code Status Full Code   Disposition          Personally reviewed Lab Studies and Imaging         Subjective:     Chief Complaint:     Michael Knight is a 68 y.o. male who presents with Feeling much better no wheezing      Review of Systems:      Pertinent positives and negatives discussed in HPI    Objective:     Intake/Output Summary (Last 24 hours) at 01/15/2024 1238  Last data filed at 01/15/2024 0900  Gross per 24 hour   Intake 250 ml   Output 500 ml   Net -250 ml      Vitals:   Vitals:    01/15/24 0855 01/15/24 0900 01/15/24 0915 01/15/24 1025   BP:  121/68  125/81   Pulse:  62 63 69   Resp:  24  18   Temp:  97.5 F (36.4 C)  98 F (36.7 C)   TempSrc:  Oral  Oral   SpO2: 95% 97%     Weight:       Height:             Physical Exam:      General: NAD  Eyes: EOMI  ENT: neck supple  Cardiovascular: Regular rate.  Respiratory: Clear to auscultation  Gastrointestinal: Soft, non tender  Genitourinary: no suprapubic tenderness  Musculoskeletal: No edema  Skin: warm, dry  Neuro: Alert.  Psych: Mood appropriate.         Medications:   Medications:    pantoprazole  (PROTONIX ) 40  mg in sodium chloride  (PF) 0.9 % 10 mL injection  40 mg IntraVENous Q12H    guaiFENesin   600 mg Oral BID    ipratropium 0.5 mg-albuterol  2.5 mg  1 Dose Inhalation Q4H RT    sodium chloride  flush  5-40 mL IntraVENous 2 times per day    enoxaparin   40 mg SubCUTAneous Daily    doxycycline  hyclate  100 mg Oral 2 times per day    methylPREDNISolone   40 mg IntraVENous Q12H    amLODIPine   10 mg Oral Daily    budesonide -formoterol   2 puff Inhalation BID RT    levothyroxine   75 mcg Oral Daily    pravastatin   40 mg Oral Nightly    propranolol   40 mg Oral Daily with breakfast    tamsulosin   0.4 mg  Oral Nightly    topiramate   100 mg Oral Nightly    traZODone   75 mg Oral Nightly      Infusions:    sodium chloride        PRN Meds: phenol, 1 spray, Q2H PRN  sodium chloride  flush, 5-40 mL, PRN  sodium chloride , , PRN  potassium chloride , 40 mEq, PRN   Or  potassium alternative oral replacement, 40 mEq, PRN   Or  potassium chloride , 10 mEq, PRN  magnesium  sulfate, 2,000 mg, PRN  polyethylene glycol, 17 g, Daily PRN  acetaminophen , 650 mg, Q6H PRN   Or  acetaminophen , 650 mg, Q6H PRN  ondansetron , 4 mg, Q8H PRN   Or  ondansetron , 4 mg, Q6H PRN  dextromethorphan-guaiFENesin , 1 tablet, BID PRN  benzonatate , 100 mg, TID PRN  albuterol  sulfate HFA, 2 puff, 4x Daily PRN  Ubrogepant , 100 mg, Daily PRN        Labs and Imaging   CT CHEST PULMONARY EMBOLISM W CONTRAST  Result Date: 01/14/2024  EXAMINATION: CTA OF THE CHEST 01/14/2024 5:20 pm TECHNIQUE: CTA of the chest was performed after the administration of intravenous contrast.  Multiplanar reformatted images are provided for review.  MIP images are provided for review. Automated exposure control, iterative reconstruction, and/or weight based adjustment of the mA/kV was utilized to reduce the radiation dose to as low as reasonably achievable. COMPARISON: 09/13/2020 HISTORY: ORDERING SYSTEM PROVIDED HISTORY: sob ro pe TECHNOLOGIST PROVIDED HISTORY: Reason for exam:->sob ro pe Additional  Contrast?->1 Reason for Exam: sob ro pe FINDINGS: Pulmonary Arteries: Pulmonary arteries are adequately opacified for evaluation.  No evidence of intraluminal filling defect to suggest pulmonary embolism.  Main pulmonary artery is normal in caliber. Mediastinum: Scattered normal size mediastinal lymph nodes are similar to the prior exam.  The heart and pericardium demonstrate no acute abnormality. There is no acute abnormality of the thoracic aorta. Lungs/pleura: There is image degradation due to patient motion.  There ways are patent.  There are no acute infiltrates or pleural effusions.  A nodular density in the inferior lingula is unchanged from 2022 and consistent with a benign process. Upper Abdomen: Limited images of the upper abdomen are unremarkable. Soft Tissues/Bones: No acute bone or soft tissue abnormality.     No evidence of pulmonary embolism or acute pulmonary abnormality.       CBC:   Recent Labs     01/14/24  1421   WBC 5.7   HGB 13.8   PLT 145     BMP:    Recent Labs     01/14/24  1421   NA 141   K 4.2   CL 110   CO2 20*   BUN 10   CREATININE 1.2   GLUCOSE 102*     Hepatic: No results for input(s): AST, ALT, BILITOT, ALKPHOS in the last 72 hours.    Invalid input(s): ALB  Lipids:   Lab Results   Component Value Date/Time    CHOL 202 03/01/2023 09:57 AM    HDL 48 03/01/2023 09:57 AM    TRIG 137 03/01/2023 09:57 AM     Hemoglobin A1C:   Lab Results   Component Value Date/Time    LABA1C 6.1 02/22/2023 02:38 PM     TSH:   Lab Results   Component Value Date/Time    TSH 0.14 03/01/2023 09:57 AM     Troponin: No results found for: TROPONINT  Lactic Acid: No results for input(s): LACTA in the last 72 hours.  BNP:  Recent Labs     01/14/24  1421   PROBNP 153*     UA:  Lab Results   Component Value Date/Time    NITRU Negative 12/28/2020 05:00 PM    COLORU Yellow 12/28/2020 05:00 PM    PHUR 8.5 12/28/2020 05:00 PM    CLARITYU Clear 12/28/2020 05:00 PM    LEUKOCYTESUR Negative 12/28/2020 05:00  PM    UROBILINOGEN 0.2 12/28/2020 05:00 PM    BILIRUBINUR Negative 12/28/2020 05:00 PM    BLOODU Negative 12/28/2020 05:00 PM    GLUCOSEU Negative 12/28/2020 05:00 PM    KETUA Negative 12/28/2020 05:00 PM     Urine Cultures: No results found for: LABURIN  Blood Cultures:   Lab Results   Component Value Date/Time    BC No Growth after 4 days of incubation. 03/04/2019 07:22 PM     Lab Results   Component Value Date/Time    BLOODCULT2 No Growth after 4 days of incubation. 03/04/2019 07:22 PM     Organism:   Lab Results   Component Value Date/Time    [facility] Saccharomyces cerevisiae 10/28/2020 07:49 AM    [facility] Sterile Mycelium 10/28/2020 07:49 AM         Electronically signed by Kimberley Blanch, MD on 01/15/2024 at 12:38 PM

## 2024-01-15 NOTE — Plan of Care (Signed)
 Assessments and vitals obtained. Pt in stable condition. Denies pain/discomfort. Denies SOB or chest discomfort. Oriented pt to call light use. Cane at bedside. Call light in reach.  Problem: Discharge Planning  Goal: Discharge to home or other facility with appropriate resources  01/15/2024 1956 by Chloe Bluett, Arlyne, RN  Outcome: Progressing  01/15/2024 1013 by Kateri Raisin, RN  Outcome: Progressing     Problem: Pain  Goal: Verbalizes/displays adequate comfort level or baseline comfort level  01/15/2024 1956 by Elyjah Hazan, Arlyne, RN  Outcome: Progressing  01/15/2024 1013 by Kateri Raisin, RN  Outcome: Progressing     Problem: Safety - Adult  Goal: Free from fall injury  01/15/2024 1956 by Raford Arlyne, RN  Outcome: Progressing  01/15/2024 1013 by Kateri Raisin, RN  Outcome: Progressing     Problem: ABCDS Injury Assessment  Goal: Absence of physical injury  01/15/2024 1956 by Raford Arlyne, RN  Outcome: Progressing  01/15/2024 1013 by Kateri Raisin, RN  Outcome: Progressing     Problem: Hematologic - Adult  Goal: Maintains hematologic stability  01/15/2024 1956 by Raford Arlyne, RN  Outcome: Progressing  01/15/2024 1013 by Kateri Raisin, RN  Outcome: Progressing     Problem: Neurosensory - Adult  Goal: Achieves maximal functionality and self care  Outcome: Progressing     Problem: Respiratory - Adult  Goal: Achieves optimal ventilation and oxygenation  Outcome: Progressing     Problem: Cardiovascular - Adult  Goal: Maintains optimal cardiac output and hemodynamic stability  Outcome: Progressing  Goal: Absence of cardiac dysrhythmias or at baseline  Outcome: Progressing     Problem: Skin/Tissue Integrity - Adult  Goal: Skin integrity remains intact  Outcome: Progressing  Goal: Oral mucous membranes remain intact  Outcome: Progressing     Problem: Musculoskeletal - Adult  Goal: Return mobility to safest level of function  Outcome: Progressing  Goal: Return ADL status to a safe level of function  Outcome:  Progressing     Problem: Gastrointestinal - Adult  Goal: Minimal or absence of nausea and vomiting  Outcome: Progressing  Goal: Maintains or returns to baseline bowel function  Outcome: Progressing  Goal: Maintains adequate nutritional intake  Outcome: Progressing     Problem: Genitourinary - Adult  Goal: Absence of urinary retention  Outcome: Progressing     Problem: Infection - Adult  Goal: Absence of infection at discharge  Outcome: Progressing  Goal: Absence of infection during hospitalization  Outcome: Progressing     Problem: Metabolic/Fluid and Electrolytes - Adult  Goal: Electrolytes maintained within normal limits  Outcome: Progressing  Goal: Hemodynamic stability and optimal renal function maintained  Outcome: Progressing  Goal: Glucose maintained within prescribed range  Outcome: Progressing

## 2024-01-15 NOTE — Other (Signed)
 RT Inhaler-Nebulizer Bronchodilator Protocol Note    There is a bronchodilator order in the chart from a provider indicating to follow the RT Bronchodilator Protocol and there is an "Initiate RT Inhaler-Nebulizer Bronchodilator Protocol" order as well (see protocol at bottom of note).    CXR Findings:  No results found.     01/15/24 1839   RT Protocol   History Pulmonary Disease 2   Respiratory pattern 2   Breath sounds 2   Cough 0   Bronchodilator Assessment Score 6       The findings from the last RT Protocol Assessment were as follows:   History Pulmonary Disease: Chronic pulmonary disease  Respiratory Pattern: Dyspnea on exertion or RR 21-25 bpm  Breath Sounds: Slightly diminished and/or crackles  Cough: Strong, spontaneous, non-productive  Indication for Bronchodilator Therapy:    Bronchodilator Assessment Score: 6    Aerosolized bronchodilator medication orders have been revised according to the RT Inhaler-Nebulizer Bronchodilator Protocol below.    Respiratory Therapist to perform RT Therapy Protocol Assessment initially then follow the protocol.  Repeat RT Therapy Protocol Assessment PRN for score 0-3 or on second treatment, BID, and PRN for scores above 3.    No Indications - adjust the frequency to every 6 hours PRN wheezing or bronchospasm, if no treatments needed after 48 hours then discontinue using Per Protocol order mode.     If indication present, adjust the RT bronchodilator orders based on the Bronchodilator Assessment Score as indicated below.  Use Inhaler orders unless patient has one or more of the following: on home nebulizer, not able to hold breath for 10 seconds, is not alert and oriented, cannot activate and use MDI correctly, or respiratory rate 25 breaths per minute or more, then use the equivalent nebulizer order(s) with same Frequency and PRN reasons based on the score.  If a patient is on this medication at home then do not decrease Frequency below that used at home.    0-3 - enter or  revise RT bronchodilator order(s) to equivalent RT Bronchodilator order with Frequency of every 4 hours PRN for wheezing or increased work of breathing using Per Protocol order mode.        4-6 - enter or revise RT Bronchodilator order(s) to two equivalent RT bronchodilator orders with one order with BID Frequency and one order with Frequency of every 4 hours PRN wheezing or increased work of breathing using Per Protocol order mode.        7-10 - enter or revise RT Bronchodilator order(s) to two equivalent RT bronchodilator orders with one order with TID Frequency and one order with Frequency of every 4 hours PRN wheezing or increased work of breathing using Per Protocol order mode.       11-13 - enter or revise RT Bronchodilator order(s) to one equivalent RT bronchodilator order with QID Frequency and an Albuterol  order with Frequency of every 4 hours PRN wheezing or increased work of breathing using Per Protocol order mode.      Greater than 13 - enter or revise RT Bronchodilator order(s) to one equivalent RT bronchodilator order with every 4 hours Frequency and an Albuterol  order with Frequency of every 2 hours PRN wheezing or increased work of breathing using Per Protocol order mode.     RT to enter RT Home Evaluation for COPD & MDI Assessment order using Per Protocol order mode.    Electronically signed by Burnard Deaner, RCP on 01/15/2024 at 6:40 PM

## 2024-01-15 NOTE — Plan of Care (Signed)
 Problem: Hematologic - Adult  Goal: Maintains hematologic stability  Outcome: Progressing      01/14/24 2357 01/15/24 0000   Vitals   Temp 97.4 F (36.3 C)  --    Temp Source Oral  --    Pulse 50 50   Heart Rate Source Brachial;Radial Telemetry   Respirations 20  --    BP (!) 92/59  --    MAP (Calculated) 70  --    BP Location Left upper arm  --    BP Upper/Lower Upper  --    BP Method Automatic  --    Patient Position Turns self  --    Cardiac Rhythm Sinus brady Sinus brady   Pain Assessment   Pain Assessment None - Denies Pain  --    Pain Level 0  --    Opioid-Induced Sedation   POSS Score S  --    Oxygen Therapy   SpO2 93 %  --    Pulse Oximeter Device Mode Intermittent  --    Pulse Oximeter Device Location Finger  --    O2 Device None (Room air)  --

## 2024-01-15 NOTE — Consults (Signed)
 [facility]   Consult Note      Reason for Consult: COPD exacerbation  Requesting Physician:     Subjective:   CHIEF COMPLAINT: Shortness of breath and cough     HPI: Patient was brought into the hospital from office due to persistent shortness of breath, cough with mucoid phlegm and wheezing for well over a month.  He has had recurrent cough associated with burning sensation in the chest, otherwise does not have any specific triggers.  He only occasionally describes postnasal drip.  His cough has been incessant and has been an issue for some time.  He also has significant dyspnea even with rest.  He describes difficulty in swallowing and choking episodes at times during meals, while trying to swallow solids.  Feels as though the food gets stuck in his throat.    Patient has had cough for several years, did undergo bronchoscopy/BAL in May 2022, All cultures including AFB was noted to be negative at that time.    Former smoker, history of latent TB.  Prior PFT from 2022 showed no evidence of obstruction.  History of GERD, previously on Protonix  40 mg daily.  EGD from 2019 showed multiple small gastric antral ulcers at that time.       The patient is a 68 y.o. male with significant past medical history of:      Diagnosis Date    Asthma     Back pain     Dyslipidemia     GERD (gastroesophageal reflux disease)     Hyperlipidemia     Hypertension     Hypothyroidism     TB (tuberculosis)         Past Surgical History:        Procedure Laterality Date    BRONCHOSCOPY N/A 10/28/2020    BRONCHOSCOPY ALVEOLAR LAVAGE performed by Laurance VEAR Leu, MD at [facility] END[facility]    ELBOW SURGERY Right     HAND SURGERY      UPPER GASTROINTESTINAL ENDOSCOPY N/A 02/08/2018    EGD BIOPSY performed by Garnette SHAUNNA Lunger, MD at [facility] END[facility]     Current Medications:    Current Facility-Administered Medications: phenol 1.4 % mouth spray 1 spray, 1 spray, Mouth/Throat, Q2H PRN  pantoprazole  (PROTONIX ) 40 mg in  sodium chloride  (PF) 0.9 % 10 mL injection, 40 mg, IntraVENous, Q12H  guaiFENesin  (MUCINEX ) extended release tablet 600 mg, 600 mg, Oral, BID  ipratropium 0.5 mg-albuterol  2.5 mg (DUONEB ) nebulizer solution 1 Dose, 1 Dose, Inhalation, Q4H RT  sodium chloride  flush 0.9 % injection 5-40 mL, 5-40 mL, IntraVENous, 2 times per day  sodium chloride  flush 0.9 % injection 5-40 mL, 5-40 mL, IntraVENous, PRN  0.9 % sodium chloride  infusion, , IntraVENous, PRN  potassium chloride  (KLOR-CON  M) extended release tablet 40 mEq, 40 mEq, Oral, PRN **OR** potassium bicarb-citric acid  (EFFER-K) effervescent tablet 40 mEq, 40 mEq, Oral, PRN **OR** potassium chloride  10 mEq/100 mL IVPB (Peripheral Line), 10 mEq, IntraVENous, PRN  magnesium  sulfate 2000 mg in 50 mL IVPB premix, 2,000 mg, IntraVENous, PRN  enoxaparin  (LOVENOX ) injection 40 mg, 40 mg, SubCUTAneous, Daily  polyethylene glycol (GLYCOLAX ) packet 17 g, 17 g, Oral, Daily PRN  acetaminophen  (TYLENOL ) tablet 650 mg, 650 mg, Oral, Q6H PRN **OR** acetaminophen  (TYLENOL ) suppository 650 mg, 650 mg, Rectal, Q6H PRN  ondansetron  (ZOFRAN -ODT) disintegrating tablet 4 mg, 4 mg, Oral, Q8H PRN **OR** ondansetron  (ZOFRAN ) injection 4 mg, 4 mg, IntraVENous, Q6H PRN  doxycycline  hyclate (VIBRA -TABS) tablet  100 mg, 100 mg, Oral, 2 times per day  methylPREDNISolone  sodium succ (SOLU-MEDROL ) 40 mg in sterile water  1 mL injection, 40 mg, IntraVENous, Q12H  dextromethorphan-guaiFENesin  (MUCINEX  DM) 30-600 MG per extended release tablet 1 tablet, 1 tablet, Oral, BID PRN  benzonatate  (TESSALON ) capsule 100 mg, 100 mg, Oral, TID PRN  albuterol  sulfate HFA (PROVENTIL ;VENTOLIN ;PROAIR ) 108 (90 Base) MCG/ACT inhaler 2 puff, 2 puff, Inhalation, 4x Daily PRN  amLODIPine  (NORVASC ) tablet 10 mg, 10 mg, Oral, Daily  budesonide -formoterol  (SYMBICORT ) 80-4.5 MCG/ACT inhaler 2 puff, 2 puff, Inhalation, BID RT  levothyroxine  (SYNTHROID ) tablet 75 mcg, 75 mcg, Oral, Daily  pravastatin  (PRAVACHOL ) tablet 40 mg, 40  mg, Oral, Nightly  propranolol  (INDERAL ) immediate release tablet 40 mg, 40 mg, Oral, Daily with breakfast  tamsulosin  (FLOMAX ) capsule 0.4 mg, 0.4 mg, Oral, Nightly  topiramate  (TOPAMAX ) tablet 100 mg, 100 mg, Oral, Nightly  traZODone  (DESYREL ) tablet 75 mg, 75 mg, Oral, Nightly  Ubrogepant  TABS 100 mg  (Patient Supplied), 100 mg, Oral, Daily PRN    No Known Allergies    Social History:    TOBACCO:   reports that he has quit smoking. He has never used smokeless tobacco.  ETOH:   reports that he does not currently use alcohol.  Patient currently lives independently    Family History:   History reviewed. No pertinent family history.    REVIEW OF SYSTEMS:    Constitutional: Fatigue and malaise   Ears, nose, mouth, throat: negative for ear drainage, epistaxis, hoarseness, nasal congestion, sore throat and voice change  Respiratory: negative except for cough, dyspnea on exertion, shortness of breath, and sputum  Cardiovascular: negative for chest pain, chest pressure/discomfort, irregular heart beat, lower extremity edema and palpitations  Gastrointestinal: negative for abdominal pain, constipation, diarrhea, jaundice, melena, odynophagia, reflux symptoms and vomiting  Hematologic/lymphatic: negative for bleeding, easy bruising, lymphadenopathy and petechiae  Musculoskeletal:negative for arthralgias, bone pain, muscle weakness, neck pain and stiff joints  Neurological: negative for dizziness, gait problems, headaches, seizures, speech problems, tremors and weakness  Behavioral/Psych: negative for anxiety, behavior problems, depression, fatigue and sleep disturbance  Endocrine: negative for diabetic symptoms including none, neuropathy, polyphagia, polyuria, polydipsia, vomiting and diarrhea and temperature intolerance  Allergic/Immunologic: negative for anaphylaxis, angioedema, hay fever and urticaria      Objective:   Patient Vitals for the past 8 hrs:   BP Temp Temp src Pulse Resp SpO2   01/15/24 1025 125/81 98 F  (36.7 C) Oral 69 18 --   01/15/24 0915 -- -- -- 63 -- --   01/15/24 0900 121/68 97.5 F (36.4 C) Oral 62 24 97 %   01/15/24 0855 -- -- -- -- -- 95 %     I/O last 3 completed shifts:  In: 10 [I.V.:10]  Out: 500 [Urine:500]  I/O this shift:  In: 240 [P.O.:240]  Out: -     Physical Exam:  General Appearance: alert and oriented to person, place and time, well developed and well- nourished, in no acute distress  Skin: warm and dry, no rash or erythema  Head: normocephalic and atraumatic  Eyes: pupils equal, round, and reactive to light, extraocular eye movements intact, conjunctivae normal  ENT: external ear and ear canal normal bilaterally, nose without deformity, nasal mucosa and turbinates normal  Neck: supple and non-tender without mass, no cervical lymphadenopathy  Pulmonary/Chest: clear to auscultation bilaterally- no wheezes, rales or rhonchi, normal air movement, no respiratory distress  Cardiovascular: normal rate, regular rhythm,  no murmurs, rubs, distal pulses intact, no  carotid bruits  Abdomen: soft, non-tender, non-distended, normal bowel sounds, no masses or organomegaly  Lymph Nodes: Cervical, supraclavicular normal  Extremities: no cyanosis, clubbing or edema  Musculoskeletal: normal range of motion, no joint swelling, deformity or tenderness  Neurologic: alert, no focal neurologic deficits    Data Review:  CBC:   Lab Results   Component Value Date/Time    WBC 5.7 01/14/2024 02:21 PM    RBC 4.35 01/14/2024 02:21 PM     BMP:   Lab Results   Component Value Date/Time    GLUCOSE 102 01/14/2024 02:21 PM    CO2 20 01/14/2024 02:21 PM    BUN 10 01/14/2024 02:21 PM    CREATININE 1.2 01/14/2024 02:21 PM    CALCIUM 9.1 01/14/2024 02:21 PM     ABG: No results found for: HCO3ART, BEART, O2SATART, PHART, THGBART, PCO2ART, PO2ART, TCO2ART    Radiology: All pertinent images / reports were reviewed as a part of this visit.    EXAMINATION:  CTA OF THE CHEST 01/14/2024 5:20 pm     TECHNIQUE:  CTA of the  chest was performed after the administration of intravenous  contrast.  Multiplanar reformatted images are provided for review.  MIP  images are provided for review. Automated exposure control, iterative  reconstruction, and/or weight based adjustment of the mA/kV was utilized to  reduce the radiation dose to as low as reasonably achievable.     COMPARISON:  09/13/2020     HISTORY:  ORDERING SYSTEM PROVIDED HISTORY: sob ro pe  TECHNOLOGIST PROVIDED HISTORY:  Reason for exam:->sob ro pe  Additional Contrast?->1  Reason for Exam: sob ro pe     FINDINGS:  Pulmonary Arteries: Pulmonary arteries are adequately opacified for  evaluation.  No evidence of intraluminal filling defect to suggest pulmonary  embolism.  Main pulmonary artery is normal in caliber.     Mediastinum: Scattered normal size mediastinal lymph nodes are similar to the  prior exam.  The heart and pericardium demonstrate no acute abnormality.  There is no acute abnormality of the thoracic aorta.     Lungs/pleura: There is image degradation due to patient motion.  There ways  are patent.  There are no acute infiltrates or pleural effusions.  A nodular  density in the inferior lingula is unchanged from 2022 and consistent with a  benign process.     Upper Abdomen: Limited images of the upper abdomen are unremarkable.     Soft Tissues/Bones: No acute bone or soft tissue abnormality.     IMPRESSION:  No evidence of pulmonary embolism or acute pulmonary abnormality.    Problem List:   ?  Asthmatic bronchitis with acute exacerbation  History of peptic ulcer disease/GERD  Dysphagia  Assessment/Plan:     ?  Asthmatic bronchitis with acute exacerbation.  Patient has persistent symptoms despite inhaler use.  His main problem is cough and shortness of breath, no improvement noted with inhalers.    2D echo from April revealed normal EF of 55 to 60%, grade 1 diastolic dysfunction.    Dysphagia with history of peptic ulcer disease. ?  GERD related cough.  Obtain GI  evaluation, would benefit from EGD.    CTA chest performed yesterday showed no acute PE or infiltrates.  Patient has a history of latent TB per chart.  Does not have symptoms or signs of active tuberculosis, prior bronchoscopy from 2022 was also negative for AFB.  Patient does not require isolation.    Pulmonary will follow  Laurance VEAR Leu, MD

## 2024-01-15 NOTE — Consults (Signed)
 Session ID: [id number]  Session Duration: 6 minutes  Language: Nepali  Interpreter ID: #[id number]  Interpreter Name: [email or web address]

## 2024-01-15 NOTE — Progress Notes (Signed)
 Internal Medicine Resident  Progress Note    Name:  Michael Knight    DOB/Age/Sex: 1955/09/11  (68 y.o. male)  MRN & CSN:  [id number] & [id number]    PCP: Vannie Cara KIDD, MD    Date of Admission: 01/14/2024    Patient Status:  Observation     Chief Complaint:   Chief Complaint   Patient presents with    Chest Pain     Chest pain and cough x 15 days. Sent by PCP.        Hospital Course:     68 y.o. male with PMHx of COPD, HTN, hypothyroidism, BPH, latent TB who was admitted with complaints of cough with sputum and worsening shortness of breath, referred from the pulmonology office; for acute exacerbation of COPD.  Sats were within normal limits in the ED, with wheeze present on exam.  Patient started on DuoNeb  and doxycycline , VBG 7.35/38 point 8/117/20 1.5.  Patient started on IV methylprednisolone  40 mg BID, Symbicort  every 4 hours. Respiratory panel and influenza test negative.  CT PE ruled out pulmonary embolism.      Subjective:  Today is:  Hospital Day: 2.  Patient seen and examined in [id number].     Patient evaluated at bedside today. He is comfortable and has no new complaints. He stated that he started experiencing migraines last night for which treatment was given. Patient complains of dysphagia.     Medications:  Reviewed    Infusion Medications    sodium chloride        Scheduled Medications    ipratropium 0.5 mg-albuterol  2.5 mg  1 Dose Inhalation Q4H RT    sodium chloride  flush  5-40 mL IntraVENous 2 times per day    enoxaparin   40 mg SubCUTAneous Daily    doxycycline  hyclate  100 mg Oral 2 times per day    methylPREDNISolone   40 mg IntraVENous Q12H    amLODIPine   10 mg Oral Daily    famotidine   20 mg Oral BID    budesonide -formoterol   2 puff Inhalation BID RT    levothyroxine   75 mcg Oral Daily    pantoprazole   40 mg Oral QAM AC    pravastatin   40 mg Oral Nightly    propranolol   40 mg Oral Daily with breakfast    tamsulosin   0.4 mg Oral Nightly    topiramate   100 mg Oral Nightly    traZODone   75 mg  Oral Nightly     PRN Meds: phenol, sodium chloride  flush, sodium chloride , potassium chloride  **OR** potassium alternative oral replacement **OR** potassium chloride , magnesium  sulfate, polyethylene glycol, acetaminophen  **OR** acetaminophen , ondansetron  **OR** ondansetron , dextromethorphan-guaiFENesin , benzonatate , albuterol  sulfate HFA, Ubrogepant       Intake/Output Summary (Last 24 hours) at 01/15/2024 0743  Last data filed at 01/15/2024 9373  Gross per 24 hour   Intake 10 ml   Output 500 ml   Net -490 ml       Physical Exam Performed:    BP 112/70   Pulse 70   Temp 97.4 F (36.3 C) (Oral)   Resp 20   Ht 1.575 m (5' 2)   Wt 68.9 kg (152 lb)   SpO2 94%   BMI 27.80 kg/m     General appearance: No apparent distress, appears stated age and cooperative.   HEENT: Pupils equal, round, and reactive to light. Conjunctivae/corneas clear.  Neck: Supple, with full range of motion. No jugular venous distention. Trachea midline.  Respiratory:  Normal respiratory effort. Clear  to auscultation, bilaterally without Rales/Wheezes/Rhonchi.  Cardiovascular: Regular rate and rhythm with normal S1/S2 without murmurs, rubs or gallops. No peripheral edema.  Abdomen: Soft, non-tender, non-distended with normal bowel sounds.  GU: No CVA tenderness.  Musculoskeletal: No clubbing or cyanosis.  Full range of motion without deformity.  Skin: Skin color, texture, turgor normal.  No rashes or lesions.  Neurologic:  Neurovascularly intact without any focal sensory/motor deficits. Cranial nerves: II-XII intact, grossly non-focal.  Psychiatric: Alert and oriented, thought content appropriate, normal insight  Capillary Refill: Brisk, 3 seconds, normal   Peripheral Pulses: +2 palpable, equal bilaterally       Labs:   Recent Labs     01/14/24  1421   WBC 5.7   HGB 13.8   HCT 39.8*   PLT 145     Recent Labs     01/14/24  1421   NA 141   K 4.2   CL 110   CO2 20*   BUN 10   CREATININE 1.2   CALCIUM 9.1     No results for input(s): AST, ALT,  BILIDIR, BILITOT, ALKPHOS in the last 72 hours.  No results for input(s): INR in the last 72 hours.  No results for input(s): CKTOTAL, TROPONINI in the last 72 hours.    Urinalysis:      Lab Results   Component Value Date/Time    NITRU Negative 12/28/2020 05:00 PM    BLOODU Negative 12/28/2020 05:00 PM    GLUCOSEU Negative 12/28/2020 05:00 PM       Radiology:  CT CHEST PULMONARY EMBOLISM W CONTRAST   Final Result   No evidence of pulmonary embolism or acute pulmonary abnormality.                 Assessment/Plan:    Active Hospital Problems    Diagnosis     COPD exacerbation [facility]) [J44.1]          Hospital Day: 2    This is a 68 y.o. male who presented to [facility] on 01/14/2024 and is being treated for:    Acute Exacerbation of COPD  CT PE negative for pulmonary embolism  Continue Duoneb  and Doxycycline   Respiratory viral panel, pneumonia panel, Legionella antigen negative  Telemetry monitoring   Follow-up pulmonology consult   IV Methylprednisone 40 mg  SpO2 monitoring   Discharge on steroids, per pulmonology recommendations     2.Hypertension   Continue Norvasc  and propranolol     3.  Migraine  Continue home Ubrogepant      4. Dysphagia  GI consult     5. BPH  Continue flomax      DVT ppx: Lovenox   GI ppx: Diet/Tube Feeds  Diet: ADULT DIET; Regular  Code Status: Full Code    PT/OT Eval Status: Not indicated    Disposition:  Anticipate discharge in 24-48 hrs      Therisa Chew MD  PGY- 1 Internal Medicine Resident  01/15/2024  7:43 AM

## 2024-01-15 NOTE — Progress Notes (Signed)
 Assessment complete. VSS. Patient resting in bed. Respirations even and easy. Call light in reach. Fall precautions in place. No needs expressed at this time.    Lawence Sensor  RN

## 2024-01-16 MED ORDER — LIDOCAINE HCL 2 % IJ SOLN
2 | Freq: Once | INTRAMUSCULAR | Status: DC | PRN
Start: 2024-01-16 — End: 2024-01-16
  Administered 2024-01-16: 13:00:00 50 via INTRAVENOUS

## 2024-01-16 MED ORDER — IPRATROPIUM-ALBUTEROL 0.5-2.5 (3) MG/3ML IN SOLN
0.5-2.5 | RESPIRATORY_TRACT | Status: AC
Start: 2024-01-16 — End: ?

## 2024-01-16 MED ORDER — LACTULOSE 10 GM/15ML PO SOLN
10 | Freq: Three times a day (TID) | ORAL | Status: DC
Start: 2024-01-16 — End: 2024-01-18
  Administered 2024-01-16 – 2024-01-18 (×5): 20 g via ORAL

## 2024-01-16 MED ORDER — PROPOFOL 200 MG/20ML IV EMUL
200 | INTRAVENOUS | Status: AC
Start: 2024-01-16 — End: ?

## 2024-01-16 MED ORDER — PROPOFOL 200 MG/20ML IV EMUL
200 | Freq: Once | INTRAVENOUS | Status: DC | PRN
Start: 2024-01-16 — End: 2024-01-16
  Administered 2024-01-16: 13:00:00 100 via INTRAVENOUS

## 2024-01-16 MED ORDER — LIDOCAINE HCL 2 % IJ SOLN
2 | INTRAMUSCULAR | Status: AC
Start: 2024-01-16 — End: ?

## 2024-01-16 MED ORDER — LORAZEPAM 0.5 MG PO TABS
0.5 | Freq: Once | ORAL | Status: DC
Start: 2024-01-16 — End: 2024-01-18

## 2024-01-16 MED FILL — TRAZODONE HCL 50 MG PO TABS: 50 mg | ORAL | Qty: 2 | Fill #0

## 2024-01-16 MED FILL — DIPRIVAN 200 MG/20ML IV EMUL: 200 MG/20ML | INTRAVENOUS | Qty: 40 | Fill #0

## 2024-01-16 MED FILL — MUCINEX 600 MG PO TB12: 600 mg | ORAL | Qty: 1 | Fill #0

## 2024-01-16 MED FILL — PRAVASTATIN SODIUM 40 MG PO TABS: 40 mg | ORAL | Qty: 1 | Fill #0

## 2024-01-16 MED FILL — UBRELVY 100 MG PO TABS: 100 mg | ORAL | Qty: 1 | Fill #0

## 2024-01-16 MED FILL — FLOMAX 0.4 MG PO CAPS: 0.4 mg | ORAL | Qty: 1 | Fill #0

## 2024-01-16 MED FILL — IPRATROPIUM-ALBUTEROL 0.5-2.5 (3) MG/3ML IN SOLN: 0.5-2.5 (3) MG/3ML | RESPIRATORY_TRACT | Qty: 3 | Fill #0

## 2024-01-16 MED FILL — PROTONIX 40 MG IV SOLR: 40 mg | INTRAVENOUS | Qty: 40 | Fill #0

## 2024-01-16 MED FILL — LACTULOSE 10 GM/15ML PO SOLN: 10 GM/15ML | ORAL | Qty: 30 | Fill #0

## 2024-01-16 MED FILL — LEVOTHYROXINE SODIUM 75 MCG PO TABS: 75 ug | ORAL | Qty: 1 | Fill #0

## 2024-01-16 MED FILL — AMLODIPINE BESYLATE 5 MG PO TABS: 5 mg | ORAL | Qty: 2 | Fill #0

## 2024-01-16 MED FILL — PROPRANOLOL HCL 20 MG PO TABS: 20 mg | ORAL | Qty: 2 | Fill #0

## 2024-01-16 MED FILL — METHYLPREDNISOLONE SODIUM SUCC 40 MG IJ SOLR: 40 mg | INTRAMUSCULAR | Qty: 40 | Fill #0

## 2024-01-16 MED FILL — TOPIRAMATE 100 MG PO TABS: 100 mg | ORAL | Qty: 1 | Fill #0

## 2024-01-16 MED FILL — DOXYCYCLINE HYCLATE 100 MG PO TABS: 100 mg | ORAL | Qty: 1 | Fill #0

## 2024-01-16 MED FILL — XYLOCAINE 2 % IJ SOLN: 2 % | INTRAMUSCULAR | Qty: 10 | Fill #0

## 2024-01-16 MED FILL — MUCUS RELIEF DM 30-600 MG PO TB12: 30-600 mg | ORAL | Qty: 1 | Fill #0

## 2024-01-16 NOTE — Progress Notes (Signed)
 Patient arrived to endo preop. Native language is [place]. Video remote utilized. Pt able to understand some english.  Teaching / education initiated regarding perioperative experience, expectations, and pain management during stay. Patient verbalized understanding.

## 2024-01-16 NOTE — Consults (Signed)
 Session ID: [id number]  Session Duration: 16 minutes  Language: Nepali  Interpreter ID: #[id number]  Interpreter Name: Carolann

## 2024-01-16 NOTE — H&P (Signed)
 Gastroenterology Note             Pre-operative History and Physical    Patient: Michael Knight  DOB: 20-Jan-1956  CSN:     History Obtained From:  patient and/or guardian.     HISTORY OF PRESENT ILLNESS:    The patient is a 68 y.o. male  here for EGD.  Chronic cough and throat clearing.eval for upper GI causes     Past Medical History:    Past Medical History:   Diagnosis Date    Asthma     Back pain     Dyslipidemia     GERD (gastroesophageal reflux disease)     Hyperlipidemia     Hypertension     Hypothyroidism     TB (tuberculosis)      Past Surgical History:    Past Surgical History:   Procedure Laterality Date    BRONCHOSCOPY N/A 10/28/2020    BRONCHOSCOPY ALVEOLAR LAVAGE performed by Laurance VEAR Leu, MD at [facility] ENDOSCOPY    ELBOW SURGERY Right     HAND SURGERY      UPPER GASTROINTESTINAL ENDOSCOPY N/A 02/08/2018    EGD BIOPSY performed by Garnette SHAUNNA Lunger, MD at [facility] END[facility]     Medications Prior to Admission:   No current facility-administered medications on file prior to encounter.     Current Outpatient Medications on File Prior to Encounter   Medication Sig Dispense Refill    levothyroxine  (SYNTHROID ) 75 MCG tablet Take 1 tablet by mouth Daily      pravastatin  (PRAVACHOL ) 40 MG tablet Take 1 tablet by mouth nightly      tamsulosin  (FLOMAX ) 0.4 MG capsule Take 1 capsule by mouth nightly      famotidine  (PEPCID ) 20 MG tablet Take 1 tablet by mouth 2 times daily      traZODone  (DESYREL ) 50 MG tablet Take 1 tablet by mouth nightly 1 and 1/2 tabs (Patient taking differently: Take 1.5 tablets by mouth nightly 1 and 1/2 tabs) 45 tablet 5    Ubrogepant  (UBRELVY ) 100 MG TABS Take 100 mg by mouth daily as needed (migraine headaches) 16 tablet 5    Dextromethorphan-guaiFENesin  (CORICIDIN HBP CONGESTION/COUGH) 10-200 MG CAPS Take 1 capsule by mouth in the morning, at noon, in the evening, and at bedtime 20 capsule 0    meclizine  (ANTIVERT ) 12.5 MG tablet TAKE 1 TABLET BY MOUTH 3 TIMES A DAY AS  NEEDED FOR DIZZINESS (Patient taking differently: Take 1 tablet by mouth 3 times daily as needed for Dizziness) 90 tablet 3    albuterol  sulfate HFA (PROVENTIL ;VENTOLIN ;PROAIR ) 108 (90 Base) MCG/ACT inhaler Inhale 2 puffs into the lungs 4 times daily as needed for Wheezing 1 each 11    fluticasone -salmeterol (ADVAIR  DISKUS) 250-50 MCG/ACT AEPB diskus inhaler Inhale 1 puff into the lungs in the morning and 1 puff in the evening. 60 each 5    propranolol  (INDERAL ) 40 MG tablet TAKE 1 TABLET BY MOUTH EVERY DAY WITH BREAKFAST 30 tablet 11    magnesium  gluconate (MAGONATE) 500 MG tablet TAKE 1 TABLET BY MOUTH TWICE A DAY 60 tablet 5    amLODIPine  (NORVASC ) 10 MG tablet Take 1 tablet by mouth daily 30 tablet 11    pantoprazole  (PROTONIX ) 40 MG tablet Take 1 tablet by mouth every morning (before breakfast) 90 tablet 1    topiramate  (TOPAMAX ) 100 MG tablet Take 1 tablet by mouth nightly 60 tablet 5    Incontinence Supply Disposable (DEPEND ADJUSTABLE UNDERWEAR) MISC  1 each by Does not apply route in the morning, at noon, and at bedtime 90 each 5    Misc. Devices (CANE) MISC 1 each by Does not apply route daily 1 each 0    meloxicam  (MOBIC ) 7.5 MG tablet Take 1 tablet by mouth 2 times daily (Patient not taking: Reported on 01/14/2024) 30 tablet 3    [DISCONTINUED] hydroCHLOROthiazide (MICROZIDE) 12.5 MG capsule Take 1 capsule by mouth daily          Allergies:  Patient has no known allergies.      Social History:   Social History     Tobacco Use    Smoking status: Former    Smokeless tobacco: Never   Substance Use Topics    Alcohol use: Not Currently     Family History:   History reviewed. No pertinent family history.    PHYSICAL EXAM:      BP 129/76   Pulse 61   Temp 97.8 F (36.6 C) (Temporal)   Resp 18   Ht 1.575 m (5' 2)   Wt 67.5 kg (148 lb 12.8 oz)   SpO2 97%   BMI 27.22 kg/m  I        Heart:   RRR, normal s1s2    Lungs:  CTA bilat,  Normal effort    Abdomen:   NT, ND      ASA Grade:  ASA 3 - Patient with  moderate systemic disease with functional limitations    Mallampati Class: 2          ASSESSMENT AND PLAN:    1.  Patient is a 68 y.o. male here for EGD with MAC.   2.  Procedure options, risks and benefits reviewed with patient.  Patient expresses understanding.    FREDIA DOSS, MD,   [facility]  01/16/2024

## 2024-01-16 NOTE — Progress Notes (Signed)
 Patient returned from ENDO. Patient tachypneic with respirations 42. Patient very anxious. Respiratory called. Breathing treatments and inhaler administered. Breathing slowed and patient starting to calm down. MD notified and ativan  ordered for anxiety.     Update 1130am-Patient respirations back to normal. No ativan  given at this time. Will continue to monitor.

## 2024-01-16 NOTE — Progress Notes (Signed)
 [facility]/CCM Progress note      Admit Date: 01/14/2024    Chief Complaint: Shortness of breath, cough and heartburn    Subjective:     Interval History: Patient had attempted EGD today-noted to have significant food particles in stomach despite being n.p.o. with concerns for severe gastroparesis and hence procedure was not completed.  Patient is now awake, alert and still complains of ongoing GERD, despite use of IV Protonix     Scheduled Meds:   LORazepam   0.5 mg Oral Once    lactulose   20 g Oral TID    pantoprazole  (PROTONIX ) 40 mg in sodium chloride  (PF) 0.9 % 10 mL injection  40 mg IntraVENous Q12H    guaiFENesin   600 mg Oral BID    ipratropium 0.5 mg-albuterol  2.5 mg  1 Dose Inhalation BID RT    sodium chloride  flush  5-40 mL IntraVENous 2 times per day    [Held by provider] enoxaparin   40 mg SubCUTAneous Daily    doxycycline  hyclate  100 mg Oral 2 times per day    methylPREDNISolone   40 mg IntraVENous Q12H    amLODIPine   10 mg Oral Daily    budesonide -formoterol   2 puff Inhalation BID RT    levothyroxine   75 mcg Oral Daily    pravastatin   40 mg Oral Nightly    propranolol   40 mg Oral Daily with breakfast    tamsulosin   0.4 mg Oral Nightly    topiramate   100 mg Oral Nightly    traZODone   75 mg Oral Nightly     Continuous Infusions:   sodium chloride        PRN Meds:phenol, ipratropium 0.5 mg-albuterol  2.5 mg, sodium chloride  flush, sodium chloride , potassium chloride  **OR** potassium alternative oral replacement **OR** potassium chloride , magnesium  sulfate, polyethylene glycol, acetaminophen  **OR** acetaminophen , ondansetron  **OR** ondansetron , dextromethorphan-guaiFENesin , benzonatate , albuterol  sulfate HFA, Ubrogepant     Review of Systems  Constitutional: Fatigue and malaise  Ears, nose, mouth, throat: negative for ear drainage, epistaxis, hoarseness, nasal congestion, sore throat and voice change  Respiratory: negative except for cough and shortness of breath  Cardiovascular: negative for chest pain, chest  pressure/discomfort, irregular heart beat, lower extremity edema and palpitations  Gastrointestinal: negative for abdominal pain, constipation, diarrhea, jaundice, melena, odynophagia, reflux symptoms and vomiting  Hematologic/lymphatic: negative for bleeding, easy bruising, lymphadenopathy and petechiae  Musculoskeletal:negative for arthralgias, bone pain, muscle weakness, neck pain and stiff joints  Neurological: negative for dizziness, gait problems, headaches, seizures, speech problems, tremors and weakness  Behavioral/Psych: negative for anxiety, behavior problems, depression, fatigue and sleep disturbance  Endocrine: negative for diabetic symptoms including none, neuropathy, polyphagia, polyuria, polydipsia, vomiting and diarrhea and temperature intolerance  Allergic/Immunologic: negative for anaphylaxis, angioedema, hay fever and urticaria    Objective:     Patient Vitals for the past 8 hrs:   BP Temp Temp src Pulse Resp SpO2 Height   01/16/24 1348 -- -- -- 89 -- -- --   01/16/24 1115 -- -- -- -- 20 -- --   01/16/24 1108 -- -- -- 90 15 97 % --   01/16/24 1030 -- -- -- 66 (!) 37 99 % --   01/16/24 1024 126/79 -- -- 63 (!) 42 -- --   01/16/24 0947 104/71 97.8 F (36.6 C) Temporal 56 15 94 % --   01/16/24 0940 95/71 -- -- 56 20 95 % --   01/16/24 0935 110/73 -- -- 62 20 93 % --   01/16/24 0930 99/71 -- -- 57 14 96 % --  01/16/24 0926 99/71 97.6 F (36.4 C) Temporal 60 13 -- --   01/16/24 0900 129/76 97.8 F (36.6 C) Temporal 61 18 97 % 1.575 m (5' 2)   01/16/24 0800 116/75 97.4 F (36.3 C) Oral 65 18 97 % --     I/O last 3 completed shifts:  In: 490 [P.O.:480; I.V.:10]  Out: 500 [Urine:500]  I/O this shift:  In: 175 [P.O.:100; I.V.:75]  Out: -     General Appearance: alert and oriented to person, place and time, well developed and well- nourished, in no acute distress  Skin: warm and dry, no rash or erythema  Head: normocephalic and atraumatic  Eyes: pupils equal, round, and reactive to light, extraocular  eye movements intact, conjunctivae normal  ENT: external ear and ear canal normal bilaterally, nose without deformity, nasal mucosa and turbinates normal  Neck: supple and non-tender without mass, no cervical lymphadenopathy  Pulmonary/Chest: clear to auscultation bilaterally- no wheezes, rales or rhonchi, normal air movement, no respiratory distress  Cardiovascular: normal rate, regular rhythm,  no murmurs, rubs, distal pulses intact, no carotid bruits  Abdomen: soft, non-tender, non-distended, normal bowel sounds, no masses or organomegaly  Lymph Nodes: Cervical, supraclavicular normal  Extremities: no cyanosis, clubbing or edema  Musculoskeletal: normal range of motion, no joint swelling, deformity or tenderness  Neurologic: alert, no focal neurologic deficits    Data Review:  CBC:   Lab Results   Component Value Date/Time    WBC 5.7 01/14/2024 02:21 PM    RBC 4.35 01/14/2024 02:21 PM     BMP:   Lab Results   Component Value Date/Time    GLUCOSE 102 01/14/2024 02:21 PM    CO2 20 01/14/2024 02:21 PM    BUN 10 01/14/2024 02:21 PM    CREATININE 1.2 01/14/2024 02:21 PM    CALCIUM 9.1 01/14/2024 02:21 PM     ABG: No results found for: HCO3ART, BEART, O2SATART, PHART, THGBART, PCO2ART, PO2ART, TCO2ART    Radiology: All pertinent images / reports were reviewed as a part of this visit.    CTA OF THE CHEST 01/14/2024 5:20 pm     TECHNIQUE:  CTA of the chest was performed after the administration of intravenous  contrast.  Multiplanar reformatted images are provided for review.  MIP  images are provided for review. Automated exposure control, iterative  reconstruction, and/or weight based adjustment of the mA/kV was utilized to  reduce the radiation dose to as low as reasonably achievable.     COMPARISON:  09/13/2020     HISTORY:  ORDERING SYSTEM PROVIDED HISTORY: sob ro pe  TECHNOLOGIST PROVIDED HISTORY:  Reason for exam:->sob ro pe  Additional Contrast?->1  Reason for Exam: sob ro pe      FINDINGS:  Pulmonary Arteries: Pulmonary arteries are adequately opacified for  evaluation.  No evidence of intraluminal filling defect to suggest pulmonary  embolism.  Main pulmonary artery is normal in caliber.     Mediastinum: Scattered normal size mediastinal lymph nodes are similar to the  prior exam.  The heart and pericardium demonstrate no acute abnormality.  There is no acute abnormality of the thoracic aorta.     Lungs/pleura: There is image degradation due to patient motion.  There ways  are patent.  There are no acute infiltrates or pleural effusions.  A nodular  density in the inferior lingula is unchanged from 2022 and consistent with a  benign process.     Upper Abdomen: Limited images of the upper abdomen are unremarkable.  Soft Tissues/Bones: No acute bone or soft tissue abnormality.     IMPRESSION:  No evidence of pulmonary embolism or acute pulmonary abnormality.    Problem List:     ?  Asthmatic bronchitis with acute exacerbation  History of peptic ulcer disease/GERD  Dysphagia    Assessment/Plan:     Asthmatic bronchitis with acute exacerbation.  Poor response to inhaler therapy, antibiotics and steroids.    History of peptic ulcer disease with GERD.  EGD attempted today which revealed significant food particles in the stomach with distention suggestive of gastroparesis.  Procedure was not completed, plans for gastric emptying study tomorrow a.m. per report.  Continue with Protonix  40 mg IV twice daily.    Laurance VEAR Leu, MD

## 2024-01-16 NOTE — Progress Notes (Signed)
 Patient to Endo

## 2024-01-16 NOTE — Progress Notes (Signed)
 V2.0    [facility] Progress Note      Name:  Michael Knight DOB/Age/Sex: 02/04/1956  (68 y.o. male)   MRN & CSN:  [id number] & [id number] Encounter Date/Time: 01/16/2024 1:17 PM EDT   Location:  [id number]-3325/3325-01 PCP: Vannie Cara KIDD, MD     Attending:Donyel Nester, MD       Hospital Day: 3    Assessment and Recommendations   Delmus B Camplin is a 68 y.o. male who presents with COPD exacerbation (HCC)      Plan:   Shortness of breath  Multifactorial COPD exacerbation  Improving from a COPD perspective  Continue Solu-Medrol  breathing treatment pulmonary following    Dysphagia with GERD like symptoms.  And when EGD see report.  Continue PPI.  Further recommendation will follow          Diet ADULT DIET; Clear Liquid; Advance as tolerated to previous diet   DVT Prophylaxis    Code Status Full Code   Disposition          Personally reviewed Lab Studies and Imaging         Subjective:     Chief Complaint:     [name] is a 68 y.o. male who presents with resting this morning had increased work of breathing post EGD      Review of Systems:      Pertinent positives and negatives discussed in HPI    Objective:     Intake/Output Summary (Last 24 hours) at 01/16/2024 1317  Last data filed at 01/16/2024 0947  Gross per 24 hour   Intake 415 ml   Output --   Net 415 ml      Vitals:   Vitals:    01/16/24 1024 01/16/24 1030 01/16/24 1108 01/16/24 1115   BP: 126/79      Pulse: 63 66 90    Resp: (!) 42 (!) 37 15 20   Temp:       TempSrc:       SpO2:  99% 97%    Weight:       Height:             Physical Exam:      General: NAD  Eyes: EOMI  ENT: neck supple  Cardiovascular: Regular rate.  Respiratory: Clear to auscultation  Gastrointestinal: Soft, non tender  Genitourinary: no suprapubic tenderness  Musculoskeletal: No edema  Skin: warm, dry  Neuro: Alert.  Psych: Mood appropriate.         Medications:   Medications:    LORazepam   0.5 mg Oral Once    pantoprazole  (PROTONIX ) 40 mg in sodium chloride  (PF) 0.9 % 10 mL injection  40 mg IntraVENous Q12H     guaiFENesin   600 mg Oral BID    ipratropium 0.5 mg-albuterol  2.5 mg  1 Dose Inhalation BID RT    sodium chloride  flush  5-40 mL IntraVENous 2 times per day    [Held by provider] enoxaparin   40 mg SubCUTAneous Daily    doxycycline  hyclate  100 mg Oral 2 times per day    methylPREDNISolone   40 mg IntraVENous Q12H    amLODIPine   10 mg Oral Daily    budesonide -formoterol   2 puff Inhalation BID RT    levothyroxine   75 mcg Oral Daily    pravastatin   40 mg Oral Nightly    propranolol   40 mg Oral Daily with breakfast    tamsulosin   0.4 mg Oral Nightly    topiramate   100  mg Oral Nightly    traZODone   75 mg Oral Nightly      Infusions:    sodium chloride        PRN Meds: phenol, 1 spray, Q2H PRN  ipratropium 0.5 mg-albuterol  2.5 mg, 1 Dose, Q4H PRN  sodium chloride  flush, 5-40 mL, PRN  sodium chloride , , PRN  potassium chloride , 40 mEq, PRN   Or  potassium alternative oral replacement, 40 mEq, PRN   Or  potassium chloride , 10 mEq, PRN  magnesium  sulfate, 2,000 mg, PRN  polyethylene glycol, 17 g, Daily PRN  acetaminophen , 650 mg, Q6H PRN   Or  acetaminophen , 650 mg, Q6H PRN  ondansetron , 4 mg, Q8H PRN   Or  ondansetron , 4 mg, Q6H PRN  dextromethorphan-guaiFENesin , 1 tablet, BID PRN  benzonatate , 100 mg, TID PRN  albuterol  sulfate HFA, 2 puff, 4x Daily PRN  Ubrogepant , 100 mg, Daily PRN        Labs and Imaging   EGD  Result Date: 01/16/2024  Patient: ABDURRAHMAN, Michael Knight MRN: [id number] DOB: 05/03/1956 Account: [id number] Sex at Birth: Male Age: 89 Years Procedure: Upper GI endoscopy Date: 01/16/2024 Attending Physician: FREDIA DOSS Indications:        - GERD Medications:        -  Monitored Anesthesia Care        -  See the Anesthesia note for documentation of the administered           medications Complications:        -  No immediate complications. Estimated Blood Loss:        -  Estimated blood loss: None. Procedure:        - The scope was introduced through the mouth and advanced to the           fundus of the stomach.        -  The upper GI  endoscopy was accomplished without difficulty.        -  The patient tolerated the procedure well. Findings:        - The scope was advanced into the oropharynx and then into the           esophagus.  As soon as the stomach was intubated significant amount           of retained food noted.  At this point it was felt unsafe to continue           the procedure.  The procedure was aborted and the scope was withdrawn           and the procedure was terminated Impression:        - Significant amount of retained food in the stomach suggesting           gastroparesis. This can explain acid regurgitation symptoms patient           has been complaining Recommendation:        - Gastric emptying study        - Continue pantoprazole         - Resume previous diet        - Follow-up in office in 6 to 8 weeks        - Please call with questions Procedure Code(s):        - 43200, Esophagoscopy, flexible, transoral; diagnostic, including           collection of specimen(s) by brushing or washing, when performed           (  separate procedure) CPT(R) - 2023 copyright [employer]. All Rights Reserved.       The CPT codes, CCI edits and ICD codes generated are intended as       suggestions and were generated based on input data.  These codes are       preliminary and upon coder review may be revised to meet current       compliance and payer requirements.  The provider is responsible for       the final determination of appropriate codes, and modifiers. Diagnosis Code(s): Michael Knight Michael Knight This document has been electronically signed. Note Initiated:01/16/2024 Note Completed:01/16/2024 9:27 AM    CT CHEST PULMONARY EMBOLISM W CONTRAST  Result Date: 01/14/2024  EXAMINATION: CTA OF THE CHEST 01/14/2024 5:20 pm TECHNIQUE: CTA of the chest was performed after the administration of intravenous contrast.  Multiplanar reformatted images are provided for review.  MIP images are provided for review. Automated exposure control,  iterative reconstruction, and/or weight based adjustment of the mA/kV was utilized to reduce the radiation dose to as low as reasonably achievable. COMPARISON: 09/13/2020 HISTORY: ORDERING SYSTEM PROVIDED HISTORY: sob ro pe TECHNOLOGIST PROVIDED HISTORY: Reason for exam:->sob ro pe Additional Contrast?->1 Reason for Exam: sob ro pe FINDINGS: Pulmonary Arteries: Pulmonary arteries are adequately opacified for evaluation.  No evidence of intraluminal filling defect to suggest pulmonary embolism.  Main pulmonary artery is normal in caliber. Mediastinum: Scattered normal size mediastinal lymph nodes are similar to the prior exam.  The heart and pericardium demonstrate no acute abnormality. There is no acute abnormality of the thoracic aorta. Lungs/pleura: There is image degradation due to patient motion.  There ways are patent.  There are no acute infiltrates or pleural effusions.  A nodular density in the inferior lingula is unchanged from 2022 and consistent with a benign process. Upper Abdomen: Limited images of the upper abdomen are unremarkable. Soft Tissues/Bones: No acute bone or soft tissue abnormality.     No evidence of pulmonary embolism or acute pulmonary abnormality.       CBC:   Recent Labs     01/14/24  1421   WBC 5.7   HGB 13.8   PLT 145     BMP:    Recent Labs     01/14/24  1421   NA 141   K 4.2   CL 110   CO2 20*   BUN 10   CREATININE 1.2   GLUCOSE 102*     Hepatic: No results for input(s): AST, ALT, BILITOT, ALKPHOS in the last 72 hours.    Invalid input(s): ALB  Lipids:   Lab Results   Component Value Date/Time    CHOL 202 03/01/2023 09:57 AM    HDL 48 03/01/2023 09:57 AM    TRIG 137 03/01/2023 09:57 AM     Hemoglobin A1C:   Lab Results   Component Value Date/Time    LABA1C 6.1 02/22/2023 02:38 PM     TSH:   Lab Results   Component Value Date/Time    TSH 0.14 03/01/2023 09:57 AM     Troponin: No results found for: TROPONINT  Lactic Acid: No results for input(s): LACTA in the last 72  hours.  BNP:   Recent Labs     01/14/24  1421   PROBNP 153*     UA:  Lab Results   Component Value Date/Time    NITRU Negative 12/28/2020 05:00 PM    COLORU Yellow 12/28/2020 05:00 PM  PHUR 8.5 12/28/2020 05:00 PM    CLARITYU Clear 12/28/2020 05:00 PM    LEUKOCYTESUR Negative 12/28/2020 05:00 PM    UROBILINOGEN 0.2 12/28/2020 05:00 PM    BILIRUBINUR Negative 12/28/2020 05:00 PM    BLOODU Negative 12/28/2020 05:00 PM    GLUCOSEU Negative 12/28/2020 05:00 PM    KETUA Negative 12/28/2020 05:00 PM     Urine Cultures: No results found for: LABURIN  Blood Cultures:   Lab Results   Component Value Date/Time    [facility]  01/14/2024 02:21 PM     No Growth to date.  Any change in status will be called.     Lab Results   Component Value Date/Time    BLOODCULT2  01/14/2024 03:04 PM     No Growth to date.  Any change in status will be called.     Organism:   Lab Results   Component Value Date/Time    [facility] Saccharomyces cerevisiae 10/28/2020 07:49 AM    [facility]G Sterile Mycelium 10/28/2020 07:49 AM         Electronically signed by Kimberley Blanch, MD on 01/16/2024 at 1:17 PM

## 2024-01-16 NOTE — Progress Notes (Signed)
 Coughing subsided after Duoneb  HHN breathing treatment.

## 2024-01-16 NOTE — Progress Notes (Signed)
 Awake, Dr Patterson talking to patient regarding findings,vu

## 2024-01-16 NOTE — Progress Notes (Signed)
 Telephone report  to [facility]. Criteria met to transfer back to inpatient Room 3325 ,Transport called

## 2024-01-16 NOTE — Plan of Care (Signed)
 Problem: Discharge Planning  Goal: Discharge to home or other facility with appropriate resources  Outcome: Progressing     Problem: Pain  Goal: Verbalizes/displays adequate comfort level or baseline comfort level  Outcome: Progressing     Problem: Safety - Adult  Goal: Free from fall injury  Outcome: Progressing     Problem: ABCDS Injury Assessment  Goal: Absence of physical injury  Outcome: Progressing     Problem: Hematologic - Adult  Goal: Maintains hematologic stability  Outcome: Progressing     Problem: Neurosensory - Adult  Goal: Achieves maximal functionality and self care  Outcome: Progressing     Problem: Respiratory - Adult  Goal: Achieves optimal ventilation and oxygenation  Outcome: Progressing     Problem: Cardiovascular - Adult  Goal: Maintains optimal cardiac output and hemodynamic stability  Outcome: Progressing  Goal: Absence of cardiac dysrhythmias or at baseline  Outcome: Progressing     Problem: Skin/Tissue Integrity - Adult  Goal: Skin integrity remains intact  Outcome: Progressing  Goal: Oral mucous membranes remain intact  Outcome: Progressing     Problem: Musculoskeletal - Adult  Goal: Return mobility to safest level of function  Outcome: Progressing  Goal: Return ADL status to a safe level of function  Outcome: Progressing     Problem: Gastrointestinal - Adult  Goal: Minimal or absence of nausea and vomiting  Outcome: Progressing  Goal: Maintains or returns to baseline bowel function  Outcome: Progressing  Goal: Maintains adequate nutritional intake  Outcome: Progressing     Problem: Genitourinary - Adult  Goal: Absence of urinary retention  Outcome: Progressing     Problem: Infection - Adult  Goal: Absence of infection at discharge  Outcome: Progressing  Goal: Absence of infection during hospitalization  Outcome: Progressing     Problem: Metabolic/Fluid and Electrolytes - Adult  Goal: Electrolytes maintained within normal limits  Outcome: Progressing  Goal: Hemodynamic stability and  optimal renal function maintained  Outcome: Progressing  Goal: Glucose maintained within prescribed range  Outcome: Progressing

## 2024-01-16 NOTE — Anesthesia Post-Procedure Evaluation (Signed)
 Department of Anesthesiology  Postprocedure Note    Patient: Michael Knight  MRN: [id number]  Birthdate: 01-Aug-1955  Date of evaluation: 01/16/2024    Procedure Summary       Date: 01/16/24 Room / Location: [facility] ENDO 05 / [facility]    Anesthesia Start: [phone] Anesthesia Stop: 0929    Procedure: ESOPHAGOGASTRODUODENOSCOPY DIAGNOSTIC ONLY (Abdomen) Diagnosis:       Gastroesophageal reflux disease, unspecified whether esophagitis present      (Gastroesophageal reflux disease, unspecified whether esophagitis present [K21.9])    Surgeons: Toniann Ranks, MD Responsible Provider: Kelvin Pain, MD    Anesthesia Type: MAC ASA Status: 3            Anesthesia Type: No value filed.    Aldrete Phase I: Aldrete Score: 8    Aldrete Phase II:      Anesthesia Post Evaluation    Patient location during evaluation: PACU  Patient participation: complete - patient participated  Level of consciousness: awake and alert  Airway patency: patent  Nausea & Vomiting: no nausea and no vomiting  Cardiovascular status: blood pressure returned to baseline  Respiratory status: acceptable  Hydration status: stable  Pain management: satisfactory to patient    No notable events documented.

## 2024-01-16 NOTE — Progress Notes (Signed)
 Internal Medicine Resident  Progress Note    Name:  Michael Knight    DOB/Age/Sex: September 30, 1955  (68 y.o. male)  MRN & CSN:  [id number] & [id number]    PCP: Vannie Cara KIDD, MD    Date of Admission: 01/14/2024    Patient Status:  Inpatient     Chief Complaint:   Chief Complaint   Patient presents with    Chest Pain     Chest pain and cough x 15 days. Sent by PCP.        Hospital Course:     68 y.o. male with PMHx of COPD, HTN, hypothyroidism, BPH, latent TB who was admitted with complaints of cough with sputum and worsening shortness of breath, referred from the pulmonology office; for acute exacerbation of COPD.  Sats were within normal limits in the ED, with wheeze present on exam.  Patient started on DuoNeb  and doxycycline , VBG 7.35/38 point 8/117/20 1.5.  Patient started on IV methylprednisolone  40 mg BID, Symbicort  every 4 hours. Respiratory panel and influenza test negative.  CT PE ruled out pulmonary embolism. He has persisting complaints of difficulty swallowing and food getting stuck, GI consulted, EGD done, showing Gastroparesis. GES tomorrow.        Subjective:  Today is:  Hospital Day: 3.  Patient seen and examined in [id number].     Patient evaluated at bedside, after EGD. He is comfortable, but complains of constipation. Plan for GES tomorrow explained.      Medications:  Reviewed    Infusion Medications    sodium chloride        Scheduled Medications    pantoprazole  (PROTONIX ) 40 mg in sodium chloride  (PF) 0.9 % 10 mL injection  40 mg IntraVENous Q12H    guaiFENesin   600 mg Oral BID    ipratropium 0.5 mg-albuterol  2.5 mg  1 Dose Inhalation BID RT    sodium chloride  flush  5-40 mL IntraVENous 2 times per day    [Held by provider] enoxaparin   40 mg SubCUTAneous Daily    doxycycline  hyclate  100 mg Oral 2 times per day    methylPREDNISolone   40 mg IntraVENous Q12H    amLODIPine   10 mg Oral Daily    budesonide -formoterol   2 puff Inhalation BID RT    levothyroxine   75 mcg Oral Daily    pravastatin   40 mg  Oral Nightly    propranolol   40 mg Oral Daily with breakfast    tamsulosin   0.4 mg Oral Nightly    topiramate   100 mg Oral Nightly    traZODone   75 mg Oral Nightly     PRN Meds: phenol, ipratropium 0.5 mg-albuterol  2.5 mg, sodium chloride  flush, sodium chloride , potassium chloride  **OR** potassium alternative oral replacement **OR** potassium chloride , magnesium  sulfate, polyethylene glycol, acetaminophen  **OR** acetaminophen , ondansetron  **OR** ondansetron , dextromethorphan-guaiFENesin , benzonatate , albuterol  sulfate HFA, Ubrogepant       Intake/Output Summary (Last 24 hours) at 01/16/2024 0735  Last data filed at 01/15/2024 1930  Gross per 24 hour   Intake 480 ml   Output --   Net 480 ml       Physical Exam Performed:    BP 110/66   Pulse 59   Temp 97.3 F (36.3 C) (Oral)   Resp 18   Ht 1.575 m (5' 2)   Wt 67.5 kg (148 lb 12.8 oz)   SpO2 97%   BMI 27.22 kg/m     General appearance: No apparent distress, appears stated age and cooperative.   HEENT: Pupils  equal, round, and reactive to light. Conjunctivae/corneas clear.  Neck: Supple, with full range of motion. No jugular venous distention. Trachea midline.  Respiratory:  Normal respiratory effort. Clear to auscultation, bilaterally without Rales/Wheezes/Rhonchi.  Cardiovascular: Regular rate and rhythm with normal S1/S2 without murmurs, rubs or gallops. No peripheral edema.  Abdomen: Soft, non-tender, non-distended with normal bowel sounds.  GU: No CVA tenderness.  Musculoskeletal: No clubbing or cyanosis.  Full range of motion without deformity.  Skin: Skin color, texture, turgor normal.  No rashes or lesions.  Neurologic:  Neurovascularly intact without any focal sensory/motor deficits. Cranial nerves: II-XII intact, grossly non-focal.  Psychiatric: Alert and oriented, thought content appropriate, normal insight  Capillary Refill: Brisk, 3 seconds, normal   Peripheral Pulses: +2 palpable, equal bilaterally       Labs:   Recent Labs     01/14/24  1421   WBC  5.7   HGB 13.8   HCT 39.8*   PLT 145     Recent Labs     01/14/24  1421   NA 141   K 4.2   CL 110   CO2 20*   BUN 10   CREATININE 1.2   CALCIUM 9.1     No results for input(s): AST, ALT, BILIDIR, BILITOT, ALKPHOS in the last 72 hours.  No results for input(s): INR in the last 72 hours.  No results for input(s): CKTOTAL, TROPONINI in the last 72 hours.    Urinalysis:      Lab Results   Component Value Date/Time    NITRU Negative 12/28/2020 05:00 PM    BLOODU Negative 12/28/2020 05:00 PM    GLUCOSEU Negative 12/28/2020 05:00 PM       Radiology:  CT CHEST PULMONARY EMBOLISM W CONTRAST   Final Result   No evidence of pulmonary embolism or acute pulmonary abnormality.                 Assessment/Plan:    Active Hospital Problems    Diagnosis     Asthmatic bronchitis [J45.909]     COPD exacerbation (HCC) [J44.1]     Gastroesophageal reflux disease [K21.9]          Hospital Day: 3    This is a 68 y.o. male who presented to [facility] on 01/14/2024 and is being treated for:    Acute Exacerbation of COPD  CT PE negative for pulmonary embolism  Continue Duoneb  and Doxycycline   Respiratory viral panel, pneumonia panel, Legionella antigen negative.  Sputum culture pending  Telemetry monitoring   IV Methylprednisone 40 mg  SpO2 monitoring   Will discharge on oral steroids     2. Hypertension   Continue Norvasc  and propranolol      3.  Migraine  Continue home Ubrogepant       4. Dysphagia  GI following, we appreciate their input.  EGD done today - Shows gastrparesis. Plan for GES tomorrow     5. BPH  Continue flomax        DVT ppx: Lovenox   GI ppx: PPI  Diet: Diet NPO Exceptions are: Sips of Water  with Meds  Code Status: Full Code    PT/OT Eval Status: Not indicated    Disposition:  Anticipate discharge in 24 to 48 hours        Therisa Chew MD  PGY- 1 Internal Medicine Resident  01/16/2024  7:35 AM

## 2024-01-16 NOTE — Progress Notes (Signed)
 Michael Knight nuclear medicine gastric exam will happen first thing in the morning on 8/7. Please hold all opiates and reglan /zelnorm. Please also make the pt NPO at midnight. RN aware.     Call with any questions  Schuyler Bradley, CNMT,R.T.(MR)   [phone]

## 2024-01-16 NOTE — Anesthesia Pre-Procedure Evaluation (Signed)
 Department of Anesthesiology  Preprocedure Note       Name:  Michael Knight   Age:  68 y.o.  DOB:  1956-01-22                                          MRN:  [id number]         Date:  01/16/2024      Surgeon: Clotilde):  Toniann Ranks, MD    Procedure: Procedure(s):  ESOPHAGOGASTRODUODENOSCOPY DIAGNOSTIC ONLY    Medications prior to admission:   Prior to Admission medications    Medication Sig Start Date End Date Taking? Authorizing Provider   levothyroxine  (SYNTHROID ) 75 MCG tablet Take 1 tablet by mouth Daily   Yes [provider]   pravastatin  (PRAVACHOL ) 40 MG tablet Take 1 tablet by mouth nightly   Yes [provider]   tamsulosin  (FLOMAX ) 0.4 MG capsule Take 1 capsule by mouth nightly   Yes [provider]   famotidine  (PEPCID ) 20 MG tablet Take 1 tablet by mouth 2 times daily   Yes [provider]   traZODone  (DESYREL ) 50 MG tablet Take 1 tablet by mouth nightly 1 and 1/2 tabs  Patient taking differently: Take 1.5 tablets by mouth nightly 1 and 1/2 tabs 01/04/24  Yes Vannie Cara KIDD, MD   Ubrogepant  (UBRELVY ) 100 MG TABS Take 100 mg by mouth daily as needed (migraine headaches) 01/04/24  Yes Vannie Cara KIDD, MD   Dextromethorphan-guaiFENesin  (CORICIDIN HBP CONGESTION/COUGH) 10-200 MG CAPS Take 1 capsule by mouth in the morning, at noon, in the evening, and at bedtime 12/25/23  Yes Walker, Dwayne O, MD   meclizine  (ANTIVERT ) 12.5 MG tablet TAKE 1 TABLET BY MOUTH 3 TIMES A DAY AS NEEDED FOR DIZZINESS  Patient taking differently: Take 1 tablet by mouth 3 times daily as needed for Dizziness 10/01/23  Yes Vannie Cara KIDD, MD   albuterol  sulfate HFA (PROVENTIL ;VENTOLIN ;PROAIR ) 108 (90 Base) MCG/ACT inhaler Inhale 2 puffs into the lungs 4 times daily as needed for Wheezing 09/24/23  Yes Lakshminarayana, Laurance DEL, MD   fluticasone -salmeterol (ADVAIR  DISKUS) 250-50 MCG/ACT AEPB diskus inhaler Inhale 1 puff into the lungs in the morning and 1 puff in the evening. 09/24/23  Yes Lakshminarayana,  Pradeep H, MD   propranolol  (INDERAL ) 40 MG tablet TAKE 1 TABLET BY MOUTH EVERY DAY WITH BREAKFAST 08/23/23  Yes Walker, Cara KIDD, MD   magnesium  gluconate (MAGONATE) 500 MG tablet TAKE 1 TABLET BY MOUTH TWICE A DAY 08/14/23  Yes Vannie Cara KIDD, MD   amLODIPine  (NORVASC ) 10 MG tablet Take 1 tablet by mouth daily 03/01/23  Yes Walker, Dwayne O, MD   pantoprazole  (PROTONIX ) 40 MG tablet Take 1 tablet by mouth every morning (before breakfast) 03/01/23  Yes Vannie Cara KIDD, MD   topiramate  (TOPAMAX ) 100 MG tablet Take 1 tablet by mouth nightly 02/22/23  Yes Vannie Cara KIDD, MD   Incontinence Supply Disposable (DEPEND ADJUSTABLE UNDERWEAR) MISC 1 each by Does not apply route in the morning, at noon, and at bedtime 09/25/23   Vannie Cara KIDD, MD   Misc. Devices (CANE) MISC 1 each by Does not apply route daily 09/25/23   Vannie Cara KIDD, MD   meloxicam  (MOBIC ) 7.5 MG tablet Take 1 tablet by mouth 2 times daily  Patient not taking: Reported on 01/14/2024 03/01/23   Vannie Cara KIDD, MD   hydroCHLOROthiazide (MICROZIDE)  12.5 MG capsule Take 1 capsule by mouth daily  12/30/20  [provider]       Current medications:    Current Facility-Administered Medications   Medication Dose Route Frequency Provider Last Rate Last Admin   . phenol 1.4 % mouth spray 1 spray  1 spray Mouth/Throat Q2H PRN Tobie Naas, MD   1 spray at 01/15/24 9361   . pantoprazole  (PROTONIX ) 40 mg in sodium chloride  (PF) 0.9 % 10 mL injection  40 mg IntraVENous Q12H Lakshminarayana, Pradeep H, MD   40 mg at 01/15/24 2014   . guaiFENesin  (MUCINEX ) extended release tablet 600 mg  600 mg Oral BID Lakshminarayana, Pradeep H, MD   600 mg at 01/15/24 2015   . ipratropium 0.5 mg-albuterol  2.5 mg (DUONEB ) nebulizer solution 1 Dose  1 Dose Inhalation BID RT Tobie, Bhumit, MD       . ipratropium 0.5 mg-albuterol  2.5 mg (DUONEB ) nebulizer solution 1 Dose  1 Dose Inhalation Q4H PRN Tobie Naas, MD   1 Dose at 01/15/24 2224   . sodium chloride  flush 0.9 %  injection 5-40 mL  5-40 mL IntraVENous 2 times per day Fonda Kung, MD   10 mL at 01/15/24 2014   . sodium chloride  flush 0.9 % injection 5-40 mL  5-40 mL IntraVENous PRN Fonda Kung, MD   10 mL at 01/15/24 0626   . 0.9 % sodium chloride  infusion   IntraVENous PRN Fonda Kung, MD       . potassium chloride  (KLOR-CON  M) extended release tablet 40 mEq  40 mEq Oral PRN Fonda Kung, MD        Or   . potassium bicarb-citric acid  (EFFER-K) effervescent tablet 40 mEq  40 mEq Oral PRN Fonda Kung, MD        Or   . potassium chloride  10 mEq/100 mL IVPB (Peripheral Line)  10 mEq IntraVENous PRN Fonda Kung, MD       . magnesium  sulfate 2000 mg in 50 mL IVPB premix  2,000 mg IntraVENous PRN Joshua, Anna, MD       . Roni by provider] enoxaparin  (LOVENOX ) injection 40 mg  40 mg SubCUTAneous Daily Fonda Kung, MD   40 mg at 01/15/24 9092   . polyethylene glycol (GLYCOLAX ) packet 17 g  17 g Oral Daily PRN Fonda Kung, MD       . acetaminophen  (TYLENOL ) tablet 650 mg  650 mg Oral Q6H PRN Fonda Kung, MD   650 mg at 01/14/24 2004    Or   . acetaminophen  (TYLENOL ) suppository 650 mg  650 mg Rectal Q6H PRN Fonda Kung, MD       . ondansetron  (ZOFRAN -ODT) disintegrating tablet 4 mg  4 mg Oral Q8H PRN Fonda Kung, MD        Or   . ondansetron  (ZOFRAN ) injection 4 mg  4 mg IntraVENous Q6H PRN Fonda Kung, MD       . doxycycline  hyclate (VIBRA -TABS) tablet 100 mg  100 mg Oral 2 times per day Fonda Kung, MD   100 mg at 01/15/24 2015   . methylPREDNISolone  sodium succ (SOLU-MEDROL ) 40 mg in sterile water  1 mL injection  40 mg IntraVENous Q12H Fonda Kung, MD   40 mg at 01/16/24 0608   . dextromethorphan-guaiFENesin  (MUCINEX  DM) 30-600 MG per extended release tablet 1 tablet  1 tablet Oral BID PRN Fonda Kung, MD       . benzonatate  (TESSALON ) capsule 100 mg  100 mg Oral TID PRN Fonda Kung,  MD   100 mg at 01/15/24 0907   . albuterol  sulfate HFA (PROVENTIL ;VENTOLIN ;PROAIR ) 108 (90 Base) MCG/ACT inhaler 2 puff  2 puff  Inhalation 4x Daily PRN Fonda Kung, MD       . amLODIPine  (NORVASC ) tablet 10 mg  10 mg Oral Daily Fonda Kung, MD   10 mg at 01/15/24 9092   . budesonide -formoterol  (SYMBICORT ) 80-4.5 MCG/ACT inhaler 2 puff  2 puff Inhalation BID RT Fonda Kung, MD   2 puff at 01/15/24 2223   . levothyroxine  (SYNTHROID ) tablet 75 mcg  75 mcg Oral Daily Fonda Kung, MD   75 mcg at 01/16/24 0608   . pravastatin  (PRAVACHOL ) tablet 40 mg  40 mg Oral Nightly Joshua, Anna, MD   40 mg at 01/15/24 2015   . propranolol  (INDERAL ) immediate release tablet 40 mg  40 mg Oral Daily with breakfast Fonda Kung, MD   40 mg at 01/15/24 0907   . tamsulosin  (FLOMAX ) capsule 0.4 mg  0.4 mg Oral Nightly Fonda Kung, MD   0.4 mg at 01/15/24 2014   . topiramate  (TOPAMAX ) tablet 100 mg  100 mg Oral Nightly Fonda Kung, MD   100 mg at 01/15/24 2014   . traZODone  (DESYREL ) tablet 75 mg  75 mg Oral Nightly Fonda Kung, MD   75 mg at 01/15/24 2015   . Ubrogepant  TABS 100 mg  (Patient Supplied)  100 mg Oral Daily PRN Fonda Kung, MD   100 mg at 01/15/24 2029       Allergies:  No Known Allergies    Problem List:    Patient Active Problem List   Diagnosis Code   . Chest pain R07.9   . Chronic cough R05.3   . Generalized abdominal pain R10.84   . 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   . Syncope and collapse R55   . Essential hypertension I10   . Dyslipidemia E78.5   . Gastroesophageal reflux disease K21.9   . Acquired hypothyroidism E03.9   . Migraine with aura and without status migrainosus, not intractable G43.109   . Prediabetes R73.03   . Difficulty walking R26.2   . Right foot pain M79.671   . Nocturia R35.1   . Mixed incontinence N39.46   . Pulmonary emphysema (HCC) J43.9   . COPD exacerbation (HCC) J44.1   . Asthmatic bronchitis J45.909       Past Medical History:        Diagnosis Date   . Asthma    . Back pain    . Dyslipidemia    . GERD (gastroesophageal reflux disease)    . Hyperlipidemia    . Hypertension    . Hypothyroidism    . TB (tuberculosis)         Past Surgical History:        Procedure Laterality Date   . BRONCHOSCOPY N/A 10/28/2020    BRONCHOSCOPY ALVEOLAR LAVAGE performed by Laurance VEAR Leu, MD at [facility] END[facility]   . ELBOW SURGERY Right    . HAND SURGERY     . UPPER GASTROINTESTINAL ENDOSCOPY N/A 02/08/2018    EGD BIOPSY performed by Garnette SHAUNNA Lunger, MD at [facility] ENDOSCOPY       Social History:    Social History     Tobacco Use   . Smoking status: Former   . Smokeless tobacco: Never   Substance Use Topics   . Alcohol use: Not Currently  Counseling given: Not Answered      Vital Signs (Current):   Vitals:    01/15/24 1930 01/15/24 2227 01/16/24 0506 01/16/24 0800   BP: 110/66   116/75   Pulse: 61 59  65   Resp: 20 18  18    Temp: 97.3 F (36.3 C)   97.4 F (36.3 C)   TempSrc: Oral   Oral   SpO2: 97% 97%  97%   Weight:   67.5 kg (148 lb 12.8 oz)    Height:                                                  BP Readings from Last 3 Encounters:   01/16/24 116/75   01/14/24 128/76   12/25/23 128/80       NPO Status: Time of last liquid consumption: 2330                                                 Date of last liquid consumption: 01/15/24                        Date of last solid food consumption: 01/15/24    BMI:   Wt Readings from Last 3 Encounters:   01/16/24 67.5 kg (148 lb 12.8 oz)   01/14/24 69.1 kg (152 lb 6.4 oz)   12/25/23 68 kg (150 lb)     Body mass index is 27.22 kg/m.    CBC:   Lab Results   Component Value Date/Time    WBC 5.7 01/14/2024 02:21 PM    RBC 4.35 01/14/2024 02:21 PM    HGB 13.8 01/14/2024 02:21 PM    HCT 39.8 01/14/2024 02:21 PM    MCV 91.4 01/14/2024 02:21 PM    RDW 14.3 01/14/2024 02:21 PM    PLT 145 01/14/2024 02:21 PM       CMP:   Lab Results   Component Value Date/Time    NA 141 01/14/2024 02:21 PM    K 4.2 01/14/2024 02:21 PM    CL 110 01/14/2024 02:21 PM    CO2 20 01/14/2024 02:21 PM    BUN 10 01/14/2024 02:21 PM    CREATININE 1.2 01/14/2024 02:21 PM    GFRAA >60 12/30/2020  04:25 AM    AGRATIO 1.6 12/26/2023 10:05 AM    LABGLOM 66 01/14/2024 02:21 PM    GLUCOSE 102 01/14/2024 02:21 PM    CALCIUM 9.1 01/14/2024 02:21 PM    BILITOT 0.3 12/26/2023 10:05 AM    ALKPHOS 103 12/26/2023 10:05 AM    AST 25 12/26/2023 10:05 AM    ALT 23 12/26/2023 10:05 AM       POC Tests: No results for input(s): POCGLU, POCNA, POCK, POCCL, POCBUN, POCHEMO, POCHCT in the last 72 hours.    Coags:   Lab Results   Component Value Date/Time    PROTIME 11.6 10/28/2020 06:05 AM    INR 1.03 10/28/2020 06:05 AM    APTT 37.0 04/05/2018 02:07 PM       HCG (If Applicable): No results found for: PREGTESTUR, PREGSERUM, HCG, HCGQUANT     ABGs: No results found for: PHART, PO2ART, PCO2ART, HCO3ART, BEART, O2SATART  Type & Screen (If Applicable):  No results found for: ABORH, LABANTI    Drug/Infectious Status (If Applicable):  Lab Results   Component Value Date/Time    HIV Non-Reactive 03/01/2023 09:57 AM       COVID-19 Screening (If Applicable):   Lab Results   Component Value Date/Time    COVID19 Not Detected 09/09/2020 11:25 AM           Anesthesia Evaluation  Patient summary reviewed and Nursing notes reviewed  Airway: Mallampati: II  TM distance: >3 FB   Neck ROM: full  Mouth opening: > = 3 FB   Dental: normal exam         Pulmonary:normal exam  breath sounds clear to auscultation  (+)  COPD:          asthma:                            Cardiovascular:  Exercise tolerance: good (>4 METS)  (+) hypertension:      ECG reviewed  Rhythm: regular  Rate: normal  Echocardiogram reviewed               ROS comment:   Left Ventricle: Normal left ventricular systolic function with a visually estimated EF of 55 - 60%. EF by 2D Simpsons Biplane is 64%. Left ventricle size is normal. Normal wall thickness. Normal wall motion. Grade I diastolic dysfunction with normal LAP.    Right Ventricle: Right ventricle size is normal. Normal systolic function.    Mitral Valve: Mild regurgitation.     Tricuspid Valve: Unable to assess RVSP due to inadequate or insignificant tricuspid regurgitation.    Pericardium: No pericardial effusion.       Neuro/Psych:               GI/Hepatic/Renal:   (+) GERD:          Endo/Other:    (+) hypothyroidism::..                 Abdominal:             Vascular:          Other Findings:       Anesthesia Plan      MAC     ASA 3       Induction: intravenous.      Anesthetic plan and risks discussed with patient.      Plan discussed with CRNA.                Arita Gentry, MD   01/16/2024

## 2024-01-16 NOTE — Progress Notes (Signed)
 THIS IS A MEDICAL STUDENT NOTE  THIS IS FOR LEARNING PURPOSES ONLY  PLEASE SEE ATTENDING PHYSICIAN NOTE FOR COMPLETE DOCUMENTATION    Name:  Michael Knight    DOB/Age/Sex: Sep 08, 1955  (68 y.o. male)  MRN & CSN:  [id number] & [id number]    PCP: Michael Cara KIDD, MD    Date of Admission: 01/14/2024    Patient Status:  Inpatient     Chief Complaint:   Chief Complaint   Patient presents with    Chest Pain     Chest pain and cough x 15 days. Sent by PCP.        Hospital Course:   Michael Knight is a 68 year-old male with past medical history of GERD, COPD, HTN, HLD, and latent TB. He presented to the ER with increasing productive cough, dyspnea, and chest pain. Vitals at the time of admission were notable for SpO2 of 94% on room air. Viral panel at the time of presentation was negative. CT with contrast of the chest was negative for pulmonary embolism and acute pulmonary process. Patient underwent EGD on 01/16/2024 that revealed significant amount of retained food in the stomach, suggesting gastroparesis.     Subjective:  Today is:  Hospital Day: 3.  Patient seen and examined in [id number].     Patient states that he feels about the same as yesterday. He does have a cough, that is dry in nature and present when he first wakes up. He does still complain of substernal chest pain, that is no radiating and tender to palpation. This chest pain is uncharged from yesterday.      Medications:  Reviewed    Infusion Medications    sodium chloride        Scheduled Medications    LORazepam   0.5 mg Oral Once    pantoprazole  (PROTONIX ) 40 mg in sodium chloride  (PF) 0.9 % 10 mL injection  40 mg IntraVENous Q12H    guaiFENesin   600 mg Oral BID    ipratropium 0.5 mg-albuterol  2.5 mg  1 Dose Inhalation BID RT    sodium chloride  flush  5-40 mL IntraVENous 2 times per day    [Held by provider] enoxaparin   40 mg SubCUTAneous Daily    doxycycline  hyclate  100 mg Oral 2 times per day    methylPREDNISolone   40 mg IntraVENous Q12H    amLODIPine   10 mg Oral  Daily    budesonide -formoterol   2 puff Inhalation BID RT    levothyroxine   75 mcg Oral Daily    pravastatin   40 mg Oral Nightly    propranolol   40 mg Oral Daily with breakfast    tamsulosin   0.4 mg Oral Nightly    topiramate   100 mg Oral Nightly    traZODone   75 mg Oral Nightly     PRN Meds: phenol, ipratropium 0.5 mg-albuterol  2.5 mg, sodium chloride  flush, sodium chloride , potassium chloride  **OR** potassium alternative oral replacement **OR** potassium chloride , magnesium  sulfate, polyethylene glycol, acetaminophen  **OR** acetaminophen , ondansetron  **OR** ondansetron , dextromethorphan-guaiFENesin , benzonatate , albuterol  sulfate HFA, Ubrogepant       Intake/Output Summary (Last 24 hours) at 01/16/2024 1124  Last data filed at 01/16/2024 0947  Gross per 24 hour   Intake 415 ml   Output --   Net 415 ml       Physical Exam Performed:    BP 126/79   Pulse 66   Temp 97.8 F (36.6 C) (Temporal)   Resp 20   Ht 1.575 m (5' 2)   Wt  67.5 kg (148 lb 12.8 oz)   SpO2 99%   BMI 27.22 kg/m     General appearance: No apparent distress, appears stated age and cooperative.   HEENT: Pupils equal, round, and reactive to light. Conjunctivae/corneas clear.  Neck: Supple, with full range of motion. No jugular venous distention. Trachea midline.  Respiratory:  Normal respiratory effort. Clear to auscultation, bilaterally without Rales/Wheezes/Rhonchi.  Cardiovascular: Regular rate and rhythm with normal S1/S2 without murmurs, rubs or gallops. No peripheral edema.  Abdomen: Soft, non-tender, non-distended with normal bowel sounds.  GU: No CVA tenderness.  Musculoskeletal: No clubbing or cyanosis.  Full range of motion without deformity.  Skin: Skin color, texture, turgor normal.  No rashes or lesions.  Neurologic:  Neurovascularly intact without any focal sensory/motor deficits. Cranial nerves: II-XII intact, grossly non-focal.  Psychiatric: Alert and oriented, thought content appropriate, normal insight  Capillary Refill: Brisk, 3  seconds, normal   Peripheral Pulses: +2 palpable, equal bilaterally       Labs:   Recent Labs     01/14/24  1421   WBC 5.7   HGB 13.8   HCT 39.8*   PLT 145     Recent Labs     01/14/24  1421   NA 141   K 4.2   CL 110   CO2 20*   BUN 10   CREATININE 1.2   CALCIUM 9.1     No results for input(s): AST, ALT, BILIDIR, BILITOT, ALKPHOS in the last 72 hours.  No results for input(s): INR in the last 72 hours.  No results for input(s): CKTOTAL, TROPONINI in the last 72 hours.    Urinalysis:      Lab Results   Component Value Date/Time    NITRU Negative 12/28/2020 05:00 PM    BLOODU Negative 12/28/2020 05:00 PM    GLUCOSEU Negative 12/28/2020 05:00 PM       Radiology:  CT CHEST PULMONARY EMBOLISM W CONTRAST   Final Result   No evidence of pulmonary embolism or acute pulmonary abnormality.         NM GASTRIC EMPTYING    (Results Pending)           Assessment/Plan:    Active Hospital Problems    Diagnosis     Asthmatic bronchitis [J45.909]     COPD exacerbation (HCC) [J44.1]     Gastroesophageal reflux disease [K21.9]          Hospital Day: 3    This is a 68 y.o. male who presented to [facility] on 01/14/2024 and is being treated for:  COPD exacerbation  Being treated with  Doxycycline  100 mg BID  Duoneb  every 4 hours  Methylprednisolone  40 mg IV BID  Symbicort  BID  Respiratory viral panel, pneumonia panel, and legionella anti negative  Continue telemetry  Continue SpO2 monitoring  Plan to transition to oral steroids before discharge    Hypertension  Continue Norvasc     Dysphagia  EGD on 01/16/2024 revealed  Significant amount of retained food in the stomach, suggesting gastroparesis  Gastric scintography planned for tomorrow morning, (01/17/2024)  Hold all opiates, Reglan /Zelnorm            DVT ppx: Lovenox   GI ppx: Diet/Tube Feeds  Diet: ADULT DIET; Clear Liquid; Advance as tolerated to previous diet  Code Status: Full Code    PT/OT Eval Status: Not indicated    Disposition: Anticipate discharge in 24-48  hours        Michael Knight  01/16/2024  11:24 AM

## 2024-01-16 NOTE — Progress Notes (Signed)
 Shift assessment completed, see flowsheets.  Medications not given until after EGD.  Plan of care discussed with patient. Safety precautions in place. Call light and bedside table within reach.   Pt denies further needs at this time.  Will continue to monitor.  Corean Shad, RN

## 2024-01-16 NOTE — Care Coordination-Inpatient (Signed)
 Case Management Assessment  Initial Evaluation    Date/Time of Evaluation: 01/16/2024 11:21 AM  Assessment Completed by: Jon SIEMENS, RN    If patient is discharged prior to next notation, then this note serves as note for discharge by case management.    Patient Name: Michael Knight                   Date of Birth: May 20, 1956  Diagnosis: COPD exacerbation (HCC) [J44.1]  Pulmonary emphysema, unspecified emphysema type (HCC) [J43.9]  Cough, unspecified type [R05.9]                   Date / Time: 01/14/2024  2:01 PM    Patient Admission Status: Inpatient   Readmission Risk (Low < 19, Mod (19-27), High > 27): Readmission Risk Score: 7.3    Current PCP: Vannie Cara KIDD, MD  PCP verified by CM? Yes Frieda, Cara KIDD, MD)    Chart Reviewed: Yes      History Provided by: Patient  Patient Orientation: Alert and Oriented, Person, Place, Situation, Self    Patient Cognition: Alert    Hospitalization in the last 30 days (Readmission):  No    If yes, Readmission Assessment in CM Navigator will be completed.    Advance Directives:      Code Status: Full Code   Patient's Primary Decision Maker is: Legal Next of Kin DALEEN FAM (Child)  [phone])      Discharge Planning:    Patient lives with: Family Members Type of Home: House  Primary Care Giver: Family  Patient Support Systems include: Family Members   Current Financial resources: Medicare  Current community resources: None  Current services prior to admission: None            Current DME:              Type of Home Care services:  None    ADLS  Prior functional level: Assistance with the following:, Independent in ADLs/IADLs, Bathing, Dressing, Toileting, Feeding, Cooking, Housework, Shopping, Mobility  Current functional level: Independent in ADLs/IADLs, Assistance with the following:, Bathing, Mobility, Shopping, Housework, Cooking, Feeding, Toileting, Dressing    PT AM-PAC:   /24  OT AM-PAC:   /24    Family can provide assistance at DC: Yes  Would you like Case Management to  discuss the discharge plan with any other family members/significant others, and if so, who? No  Plans to Return to Present Housing: Yes  Other Identified Issues/Barriers to RETURNING to current housing: yes   Potential Assistance needed at discharge: N/A            Potential DME:    Patient expects to discharge to: House  Plan for transportation at discharge:      Financial    Payor: [employer] MEDICARE / Plan: DREMA DUAL COMPLETE / Product Type: *No Product type* /     Does insurance require precert for SNF: Yes    Potential assistance Purchasing Medications: No  Meds-to-Beds request:        [facility] [id number] [place], [place] - [address] - [place] [phone] [place] [phone]  [address] [place] [address]  Phone: [phone] Fax: [phone]      Notes:    Factors facilitating achievement of predicted outcomes: Family support, Motivated, Cooperative, and Pleasant    Barriers to discharge: Medical complications    Additional Case Management Notes: Pt is a 68 y.o. male with PMHx of COPD,  HTN, hypothyroidism, BPH, latent TB who was admitted with complaints of cough with sputum and worsening shortness of breath, referred from the pulmonology office; for acute exacerbation of COPD     Pt has active insurance [employer] DUAL COMPLETE   Pt has an active PCP Vannie Cara KIDD, MD  Pt has used [facility] in the past ; referral made in careport.     The Plan for Transition of Care is related to the following treatment goals of COPD exacerbation (HCC) [J44.1]  Pulmonary emphysema, unspecified emphysema type (HCC) [J43.9]  Cough, unspecified type [R05.9]    IF APPLICABLE: The Patient and/or patient representative Piper and his family were provided with a choice of provider and agrees with the discharge plan. Freedom of choice list with basic dialogue that supports the patient's individualized plan of care/goals and shares the quality data associated with the providers was provided to:  Patient   Patient Representative Name:       The Patient and/or Patient Representative Agree with the Discharge Plan? Yes    Jon SIEMENS, RN  Case Management Department  Ph: [phone]

## 2024-01-17 ENCOUNTER — Inpatient Hospital Stay: Admit: 2024-01-17 | Payer: Medicare (Managed Care) | Primary: Internal Medicine

## 2024-01-17 MED ORDER — TECHNETIUM TC 99M SULFUR COLLOID EGG
Freq: Once | Status: AC | PRN
Start: 2024-01-17 — End: 2024-01-17
  Administered 2024-01-17: 12:00:00 0.896 via ORAL

## 2024-01-17 MED ORDER — PANTOPRAZOLE SODIUM 40 MG PO TBEC
40 | ORAL_TABLET | Freq: Two times a day (BID) | ORAL | 0 refills | 30.00000 days | Status: AC
Start: 2024-01-17 — End: ?

## 2024-01-17 MED ORDER — DOXYCYCLINE HYCLATE 100 MG PO TABS
100 | ORAL_TABLET | Freq: Two times a day (BID) | ORAL | 0 refills | 7.00000 days | Status: DC
Start: 2024-01-17 — End: 2024-01-18

## 2024-01-17 MED ORDER — METOCLOPRAMIDE HCL 5 MG PO TABS
5 | ORAL_TABLET | Freq: Three times a day (TID) | ORAL | 0 refills | 9.00000 days | Status: DC
Start: 2024-01-17 — End: 2024-02-26

## 2024-01-17 MED FILL — DOXYCYCLINE HYCLATE 100 MG PO TABS: 100 mg | ORAL | Qty: 1 | Fill #0

## 2024-01-17 MED FILL — AMLODIPINE BESYLATE 5 MG PO TABS: 5 mg | ORAL | Qty: 2 | Fill #0

## 2024-01-17 MED FILL — PROTONIX 40 MG IV SOLR: 40 mg | INTRAVENOUS | Qty: 40 | Fill #0

## 2024-01-17 MED FILL — TOPIRAMATE 100 MG PO TABS: 100 mg | ORAL | Qty: 1 | Fill #0

## 2024-01-17 MED FILL — PROPRANOLOL HCL 20 MG PO TABS: 20 mg | ORAL | Qty: 2 | Fill #0

## 2024-01-17 MED FILL — MUCINEX 600 MG PO TB12: 600 mg | ORAL | Qty: 1 | Fill #0

## 2024-01-17 MED FILL — LEVOTHYROXINE SODIUM 75 MCG PO TABS: 75 ug | ORAL | Qty: 1 | Fill #0

## 2024-01-17 MED FILL — FLOMAX 0.4 MG PO CAPS: 0.4 mg | ORAL | Qty: 1 | Fill #0

## 2024-01-17 MED FILL — LACTULOSE 10 GM/15ML PO SOLN: 10 GM/15ML | ORAL | Qty: 30 | Fill #0

## 2024-01-17 MED FILL — TRAZODONE HCL 50 MG PO TABS: 50 mg | ORAL | Qty: 2 | Fill #0

## 2024-01-17 MED FILL — PRAVASTATIN SODIUM 40 MG PO TABS: 40 mg | ORAL | Qty: 1 | Fill #0

## 2024-01-17 NOTE — Consults (Signed)
 Session ID: [id number]  Session Duration: 15 minutes  Language: Nepali  Interpreter ID: [phone]  Interpreter Name: Arlyce

## 2024-01-17 NOTE — Progress Notes (Signed)
 [facility]/CCM Progress note      Admit Date: 01/14/2024    Chief Complaint: Shortness of breath, cough and heartburn    Subjective:     Interval History: Still has shortness of breath and cough.  States that his stomach feels full with burning sensation in his upper abdomen and central chest.    Scheduled Meds:   LORazepam   0.5 mg Oral Once    lactulose   20 g Oral TID    pantoprazole  (PROTONIX ) 40 mg in sodium chloride  (PF) 0.9 % 10 mL injection  40 mg IntraVENous Q12H    guaiFENesin   600 mg Oral BID    sodium chloride  flush  5-40 mL IntraVENous 2 times per day    enoxaparin   40 mg SubCUTAneous Daily    doxycycline  hyclate  100 mg Oral 2 times per day    amLODIPine   10 mg Oral Daily    budesonide -formoterol   2 puff Inhalation BID RT    levothyroxine   75 mcg Oral Daily    pravastatin   40 mg Oral Nightly    propranolol   40 mg Oral Daily with breakfast    tamsulosin   0.4 mg Oral Nightly    topiramate   100 mg Oral Nightly    traZODone   75 mg Oral Nightly     Continuous Infusions:   sodium chloride        PRN Meds:phenol, ipratropium 0.5 mg-albuterol  2.5 mg, sodium chloride  flush, sodium chloride , potassium chloride  **OR** potassium alternative oral replacement **OR** potassium chloride , magnesium  sulfate, polyethylene glycol, acetaminophen  **OR** acetaminophen , ondansetron  **OR** ondansetron , dextromethorphan-guaiFENesin , benzonatate , albuterol  sulfate HFA, Ubrogepant     Review of Systems  Constitutional: Fatigue and malaise  Ears, nose, mouth, throat: negative for ear drainage, epistaxis, hoarseness, nasal congestion, sore throat and voice change  Respiratory: negative except for cough and shortness of breath  Cardiovascular: negative for chest pain, chest pressure/discomfort, irregular heart beat, lower extremity edema and palpitations  Gastrointestinal: negative for abdominal pain, constipation, diarrhea, jaundice, melena, odynophagia, reflux symptoms and vomiting  Hematologic/lymphatic: negative for bleeding, easy  bruising, lymphadenopathy and petechiae  Musculoskeletal:negative for arthralgias, bone pain, muscle weakness, neck pain and stiff joints  Neurological: negative for dizziness, gait problems, headaches, seizures, speech problems, tremors and weakness  Behavioral/Psych: negative for anxiety, behavior problems, depression, fatigue and sleep disturbance  Endocrine: negative for diabetic symptoms including none, neuropathy, polyphagia, polyuria, polydipsia, vomiting and diarrhea and temperature intolerance  Allergic/Immunologic: negative for anaphylaxis, angioedema, hay fever and urticaria    Objective:     Patient Vitals for the past 8 hrs:   BP Temp Temp src Pulse Resp SpO2 Weight   01/17/24 1222 110/75 97.5 F (36.4 C) Oral 54 18 98 % --   01/17/24 1207 -- -- -- 68 18 96 % --   01/17/24 0533 -- -- -- -- -- -- 67 kg (147 lb 12.8 oz)     I/O last 3 completed shifts:  In: 415 [P.O.:340; I.V.:75]  Out: -   No intake/output data recorded.    General Appearance: alert and oriented to person, place and time, well developed and well- nourished, in no acute distress  Skin: warm and dry, no rash or erythema  Head: normocephalic and atraumatic  Eyes: pupils equal, round, and reactive to light, extraocular eye movements intact, conjunctivae normal  ENT: external ear and ear canal normal bilaterally, nose without deformity, nasal mucosa and turbinates normal  Neck: supple and non-tender without mass, no cervical lymphadenopathy  Pulmonary/Chest: clear to auscultation bilaterally- no wheezes, rales  or rhonchi, normal air movement, no respiratory distress  Cardiovascular: normal rate, regular rhythm,  no murmurs, rubs, distal pulses intact, no carotid bruits  Abdomen: soft, non-tender, non-distended, normal bowel sounds, no masses or organomegaly  Lymph Nodes: Cervical, supraclavicular normal  Extremities: no cyanosis, clubbing or edema  Musculoskeletal: normal range of motion, no joint swelling, deformity or  tenderness  Neurologic: alert, no focal neurologic deficits    Data Review:  CBC:   Lab Results   Component Value Date/Time    WBC 5.7 01/14/2024 02:21 PM    RBC 4.35 01/14/2024 02:21 PM     BMP:   Lab Results   Component Value Date/Time    GLUCOSE 102 01/14/2024 02:21 PM    CO2 20 01/14/2024 02:21 PM    BUN 10 01/14/2024 02:21 PM    CREATININE 1.2 01/14/2024 02:21 PM    CALCIUM 9.1 01/14/2024 02:21 PM     ABG: No results found for: HCO3ART, BEART, O2SATART, PHART, THGBART, PCO2ART, PO2ART, TCO2ART    Radiology: All pertinent images / reports were reviewed as a part of this visit.    CTA OF THE CHEST 01/14/2024 5:20 pm     TECHNIQUE:  CTA of the chest was performed after the administration of intravenous  contrast.  Multiplanar reformatted images are provided for review.  MIP  images are provided for review. Automated exposure control, iterative  reconstruction, and/or weight based adjustment of the mA/kV was utilized to  reduce the radiation dose to as low as reasonably achievable.     COMPARISON:  09/13/2020     HISTORY:  ORDERING SYSTEM PROVIDED HISTORY: sob ro pe  TECHNOLOGIST PROVIDED HISTORY:  Reason for exam:->sob ro pe  Additional Contrast?->1  Reason for Exam: sob ro pe     FINDINGS:  Pulmonary Arteries: Pulmonary arteries are adequately opacified for  evaluation.  No evidence of intraluminal filling defect to suggest pulmonary  embolism.  Main pulmonary artery is normal in caliber.     Mediastinum: Scattered normal size mediastinal lymph nodes are similar to the  prior exam.  The heart and pericardium demonstrate no acute abnormality.  There is no acute abnormality of the thoracic aorta.     Lungs/pleura: There is image degradation due to patient motion.  There ways  are patent.  There are no acute infiltrates or pleural effusions.  A nodular  density in the inferior lingula is unchanged from 2022 and consistent with a  benign process.     Upper Abdomen: Limited images of the upper abdomen  are unremarkable.     Soft Tissues/Bones: No acute bone or soft tissue abnormality.     IMPRESSION:  No evidence of pulmonary embolism or acute pulmonary abnormality.    Problem List:     ?  Asthmatic bronchitis with acute exacerbation  History of peptic ulcer disease/GERD  Dysphagia    Assessment/Plan:     Asthmatic bronchitis with acute exacerbation.  Poor response to inhaler therapy, antibiotics and steroids.    History of peptic ulcer disease with GERD.  EGD attempted today which revealed significant food particles in the stomach with distention suggestive of gastroparesis.  Continue with Protonix  40 mg IV twice daily.    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 ?  Reglan , await GI evaluation.    Laurance VEAR Leu, MD

## 2024-01-17 NOTE — Consults (Signed)
 Session ID: [id number]  Session Duration: 9 minutes  Language: Nepali  Interpreter ID: [phone]  Interpreter Name: Arlyce

## 2024-01-17 NOTE — Plan of Care (Signed)
 Problem: Discharge Planning  Goal: Discharge to home or other facility with appropriate resources  Outcome: Progressing     Problem: Pain  Goal: Verbalizes/displays adequate comfort level or baseline comfort level  Outcome: Progressing     Problem: Safety - Adult  Goal: Free from fall injury  Outcome: Progressing     Problem: ABCDS Injury Assessment  Goal: Absence of physical injury  Outcome: Progressing     Problem: Hematologic - Adult  Goal: Maintains hematologic stability  Outcome: Progressing     Problem: Neurosensory - Adult  Goal: Achieves maximal functionality and self care  Outcome: Progressing     Problem: Respiratory - Adult  Goal: Achieves optimal ventilation and oxygenation  Outcome: Progressing     Problem: Cardiovascular - Adult  Goal: Maintains optimal cardiac output and hemodynamic stability  Outcome: Progressing  Goal: Absence of cardiac dysrhythmias or at baseline  Outcome: Progressing     Problem: Skin/Tissue Integrity - Adult  Goal: Skin integrity remains intact  Outcome: Progressing  Goal: Oral mucous membranes remain intact  Outcome: Progressing     Problem: Musculoskeletal - Adult  Goal: Return mobility to safest level of function  Outcome: Progressing  Goal: Return ADL status to a safe level of function  Outcome: Progressing     Problem: Gastrointestinal - Adult  Goal: Minimal or absence of nausea and vomiting  Outcome: Progressing  Goal: Maintains or returns to baseline bowel function  Outcome: Progressing  Goal: Maintains adequate nutritional intake  Outcome: Progressing     Problem: Genitourinary - Adult  Goal: Absence of urinary retention  Outcome: Progressing     Problem: Infection - Adult  Goal: Absence of infection at discharge  Outcome: Progressing  Goal: Absence of infection during hospitalization  Outcome: Progressing     Problem: Metabolic/Fluid and Electrolytes - Adult  Goal: Electrolytes maintained within normal limits  Outcome: Progressing  Goal: Hemodynamic stability and  optimal renal function maintained  Outcome: Progressing  Goal: Glucose maintained within prescribed range  Outcome: Progressing

## 2024-01-17 NOTE — Consults (Signed)
 Session ID: [id number]  Session Duration: 17 minutes  Language: Nepali  Interpreter ID: #[id number]  Interpreter Name: Tonja

## 2024-01-17 NOTE — Consults (Signed)
 Session ID: [id number]  Session Duration: 4 minutes  Language: Nepali  Interpreter ID: #[id number]  Interpreter Name: Jaska

## 2024-01-17 NOTE — Consults (Signed)
 Session ID: [id number]  Session Duration: 18 minutes  Language: Nepali  Interpreter ID: [phone]  Interpreter Name: Arlyce

## 2024-01-17 NOTE — Care Coordination-Inpatient (Addendum)
 Discharge Planning:    Case Manager (CM)     Pt has used [facility] in [facility] past     They went out of business   Was able to  set patient up with   [facility], [facility] [employer] [phone]    Referral sent through careport     Electronically signed by Jon SIEMENS, RN on 01/17/24 at 12:10 PM EDT

## 2024-01-17 NOTE — Plan of Care (Signed)
 Problem: Discharge Planning  Goal: Discharge to home or other facility with appropriate resources  01/17/2024 0354 by Emilio Beam, RN  Outcome: Progressing  01/17/2024 0349 by Emilio Beam, RN  Outcome: Progressing     Problem: Pain  Goal: Verbalizes/displays adequate comfort level or baseline comfort level  01/17/2024 0354 by Emilio Beam, RN  Outcome: Progressing  01/17/2024 0349 by Emilio Beam, RN  Outcome: Progressing     Problem: Safety - Adult  Goal: Free from fall injury  01/17/2024 0354 by Emilio Beam, RN  Outcome: Progressing  01/17/2024 0349 by Emilio Beam, RN  Outcome: Progressing     Problem: ABCDS Injury Assessment  Goal: Absence of physical injury  01/17/2024 0354 by Emilio Beam, RN  Outcome: Progressing  01/17/2024 0349 by Emilio Beam, RN  Outcome: Progressing     Problem: Hematologic - Adult  Goal: Maintains hematologic stability  01/17/2024 0354 by Emilio Beam, RN  Outcome: Progressing  01/17/2024 0349 by Emilio Beam, RN  Outcome: Progressing     Problem: Neurosensory - Adult  Goal: Achieves maximal functionality and self care  01/17/2024 0354 by Emilio Beam, RN  Outcome: Progressing  01/17/2024 0349 by Emilio Beam, RN  Outcome: Progressing     Problem: Respiratory - Adult  Goal: Achieves optimal ventilation and oxygenation  01/17/2024 0354 by Emilio Beam, RN  Outcome: Progressing  01/17/2024 0349 by Emilio Beam, RN  Outcome: Progressing     Problem: Cardiovascular - Adult  Goal: Maintains optimal cardiac output and hemodynamic stability  01/17/2024 0354 by Emilio Beam, RN  Outcome: Progressing  01/17/2024 0349 by Emilio Beam, RN  Outcome: Progressing  Goal: Absence of cardiac dysrhythmias or at baseline  01/17/2024 0354 by Emilio Beam, RN  Outcome: Progressing  01/17/2024 0349 by Emilio Beam, RN  Outcome: Progressing     Problem: Skin/Tissue Integrity - Adult  Goal: Skin integrity remains intact  01/17/2024 0354 by Emilio Beam, RN  Outcome:  Progressing  01/17/2024 0349 by Emilio Beam, RN  Outcome: Progressing  Goal: Oral mucous membranes remain intact  01/17/2024 0354 by Emilio Beam, RN  Outcome: Progressing  01/17/2024 0349 by Emilio Beam, RN  Outcome: Progressing     Problem: Musculoskeletal - Adult  Goal: Return mobility to safest level of function  01/17/2024 0354 by Emilio Beam, RN  Outcome: Progressing  01/17/2024 0349 by Emilio Beam, RN  Outcome: Progressing  Goal: Return ADL status to a safe level of function  01/17/2024 0354 by Emilio Beam, RN  Outcome: Progressing  01/17/2024 0349 by Emilio Beam, RN  Outcome: Progressing     Problem: Gastrointestinal - Adult  Goal: Minimal or absence of nausea and vomiting  01/17/2024 0354 by Emilio Beam, RN  Outcome: Progressing  01/17/2024 0349 by Emilio Beam, RN  Outcome: Progressing  Goal: Maintains or returns to baseline bowel function  01/17/2024 0354 by Emilio Beam, RN  Outcome: Progressing  01/17/2024 0349 by Emilio Beam, RN  Outcome: Progressing  Goal: Maintains adequate nutritional intake  01/17/2024 0354 by Emilio Beam, RN  Outcome: Progressing  01/17/2024 0349 by Emilio Beam, RN  Outcome: Progressing     Problem: Genitourinary - Adult  Goal: Absence of urinary retention  01/17/2024 0354 by Emilio Beam, RN  Outcome: Progressing  01/17/2024 0349 by Emilio Beam, RN  Outcome: Progressing     Problem: Infection - Adult  Goal: Absence of infection at discharge  01/17/2024 0354 by Emilio Beam, RN  Outcome: Progressing  01/17/2024 0349 by Emilio Beam, RN  Outcome: Progressing  Goal: Absence of infection during hospitalization  01/17/2024 0354 by Emilio Beam, RN  Outcome: Progressing  01/17/2024 0349 by Emilio Beam, RN  Outcome: Progressing     Problem: Metabolic/Fluid and Electrolytes - Adult  Goal: Electrolytes maintained within normal limits  01/17/2024 0354 by Emilio Beam, RN  Outcome: Progressing  01/17/2024 0349 by Emilio Beam,  RN  Outcome: Progressing  Goal: Hemodynamic stability and optimal renal function maintained  01/17/2024 0354 by Emilio Beam, RN  Outcome: Progressing  01/17/2024 0349 by Emilio Beam, RN  Outcome: Progressing  Goal: Glucose maintained within prescribed range  01/17/2024 0354 by Emilio Beam, RN  Outcome: Progressing  01/17/2024 0349 by Emilio Beam, RN  Outcome: Progressing

## 2024-01-17 NOTE — Progress Notes (Signed)
 Internal Medicine Resident  Progress Note    Name:  Michael Knight    DOB/Age/Sex: [birth year]  (68 y.o. male)  MRN & CSN:  [id number] & [id number]    PCP: Vannie Cara KIDD, MD    Date of Admission: 01/14/2024    Patient Status:  Inpatient     Chief Complaint:   Chief Complaint   Patient presents with    Chest Pain     Chest pain and cough x 15 days. Sent by PCP.        Hospital Course:     68 y.o. male with PMHx of COPD, HTN, hypothyroidism, BPH, latent TB who was admitted with complaints of cough with sputum and worsening shortness of breath, referred from the pulmonology office; for acute exacerbation of COPD.  Sats were within normal limits in the ED, with wheeze present on exam.  Patient started on DuoNeb  and doxycycline , VBG 7.35/38 point 8/117/20 1.5.  Patient started on IV methylprednisolone  40 mg BID, Symbicort  every 4 hours. Respiratory panel and influenza test negative.  CT PE ruled out pulmonary embolism. He has persisting complaints of difficulty swallowing and food getting stuck, GI consulted, EGD done, showing Gastroparesis. GES today showed gastroparesis.         Subjective:  Today is:  Hospital Day: 4.  Patient seen and examined in 3AN-3325/3325-01.     Patient evaluated at bedside today after the scan. He is comfortable and has no new complaints.      Medications:  Reviewed    Infusion Medications    sodium chloride        Scheduled Medications    LORazepam   0.5 mg Oral Once    lactulose   20 g Oral TID    pantoprazole  (PROTONIX ) 40 mg in sodium chloride  (PF) 0.9 % 10 mL injection  40 mg IntraVENous Q12H    guaiFENesin   600 mg Oral BID    sodium chloride  flush  5-40 mL IntraVENous 2 times per day    [Held by provider] enoxaparin   40 mg SubCUTAneous Daily    doxycycline  hyclate  100 mg Oral 2 times per day    amLODIPine   10 mg Oral Daily    budesonide -formoterol   2 puff Inhalation BID RT    levothyroxine   75 mcg Oral Daily    pravastatin   40 mg Oral Nightly    propranolol   40 mg Oral Daily with breakfast     tamsulosin   0.4 mg Oral Nightly    topiramate   100 mg Oral Nightly    traZODone   75 mg Oral Nightly     PRN Meds: technetium sulfur  colloid egg, phenol, ipratropium 0.5 mg-albuterol  2.5 mg, sodium chloride  flush, sodium chloride , potassium chloride  **OR** potassium alternative oral replacement **OR** potassium chloride , magnesium  sulfate, polyethylene glycol, acetaminophen  **OR** acetaminophen , ondansetron  **OR** ondansetron , dextromethorphan-guaiFENesin , benzonatate , albuterol  sulfate HFA, Ubrogepant       Intake/Output Summary (Last 24 hours) at 01/17/2024 0813  Last data filed at 01/16/2024 0947  Gross per 24 hour   Intake 175 ml   Output --   Net 175 ml       Physical Exam Performed:    BP 113/70   Pulse 68   Temp 97.4 F (36.3 C) (Oral)   Resp 18   Ht 1.575 m (5' 2)   Wt 67 kg (147 lb 12.8 oz)   SpO2 95%   BMI 27.03 kg/m     General appearance: No apparent distress, appears stated age and cooperative.   HEENT: Pupils  equal, round, and reactive to light. Conjunctivae/corneas clear.  Neck: Supple, with full range of motion. No jugular venous distention. Trachea midline.  Respiratory:  Normal respiratory effort. Clear to auscultation, bilaterally without Rales/Wheezes/Rhonchi.  Cardiovascular: Regular rate and rhythm with normal S1/S2 without murmurs, rubs or gallops. No peripheral edema.  Abdomen: Soft, non-tender, non-distended with normal bowel sounds.  GU: No CVA tenderness.  Musculoskeletal: No clubbing or cyanosis.  Full range of motion without deformity.  Skin: Skin color, texture, turgor normal.  No rashes or lesions.  Neurologic:  Neurovascularly intact without any focal sensory/motor deficits. Cranial nerves: II-XII intact, grossly non-focal.  Psychiatric: Alert and oriented, thought content appropriate, normal insight  Capillary Refill: Brisk, 3 seconds, normal   Peripheral Pulses: +2 palpable, equal bilaterally       Labs:   Recent Labs     01/14/24  1421   WBC 5.7   HGB 13.8   HCT 39.8*   PLT 145      Recent Labs     01/14/24  1421   NA 141   K 4.2   CL 110   CO2 20*   BUN 10   CREATININE 1.2   CALCIUM 9.1     No results for input(s): AST, ALT, BILIDIR, BILITOT, ALKPHOS in the last 72 hours.  No results for input(s): INR in the last 72 hours.  No results for input(s): CKTOTAL, TROPONINI in the last 72 hours.    Urinalysis:      Lab Results   Component Value Date/Time    NITRU Negative 12/28/2020 05:00 PM    BLOODU Negative 12/28/2020 05:00 PM    GLUCOSEU Negative 12/28/2020 05:00 PM       Radiology:  CT CHEST PULMONARY EMBOLISM W CONTRAST   Final Result   No evidence of pulmonary embolism or acute pulmonary abnormality.         NM GASTRIC EMPTYING    (Results Pending)           Assessment/Plan:    Active Hospital Problems    Diagnosis     Asthmatic bronchitis [J45.909]     COPD exacerbation (HCC) [J44.1]     Gastroesophageal reflux disease [K21.9]          Hospital Day: 4    This is a 68 y.o. male who presented to [facility] on 01/14/2024 and is being treated for:    Acute Exacerbation of COPD  CT PE negative for pulmonary embolism  Continue Duoneb  and Doxycycline   Respiratory viral panel, pneumonia panel, Legionella antigen negative.  IV Methylprednisone 40 mg  SpO2 monitoring   Will discharge on oral steroids     2. Hypertension   Continue Norvasc  and propranolol      3.  Migraine  Continue home Ubrogepant       4. Dysphagia  GI following, we appreciate their input.  EGD(01/16/2024): Gastroparesis with increased food particles in stomach .   GES done today: Gastroparesis  Continue Reglan       5. BPH  Continue flomax        DVT ppx: Lovenox   GI ppx: PPI  Diet: ADULT DIET; Clear Liquid; Advance as tolerated to previous diet  Code Status: Full Code    PT/OT Eval Status: Not indicated    Disposition:  Anticipate discharge tomorrow        Therisa Chew MD  PGY- 1 Internal Medicine Resident  01/17/2024  8:13 AM

## 2024-01-17 NOTE — Progress Notes (Signed)
 V2.0    [facility] Progress Note      Name:  Michael Knight DOB/Age/Sex: [birth year]  (68 y.o. male)   MRN & CSN:  [id number] & [id number] Encounter Date/Time: 01/17/2024 1:17 PM EDT   Location:  3AN-3325/3325-01 PCP: Vannie Cara KIDD, MD     Attending:Rosaelena Kemnitz, MD       Hospital Day: 4    Assessment and Recommendations   Michael Knight is a 68 y.o. male who presents with COPD exacerbation (HCC)      Plan:   Shortness of breath  Multifactorial COPD exacerbation  Improving from a COPD perspective  Improving off of steroid continue inhalers.    Likely has GI component      Dysphagia with GERD like symptoms.  EGD some mild gastritis.  There were significant fibrotic and likely concerning for possible gastroparesis  Upper GI gastric emptying study noted.  Continue Reglan  now.            Diet ADULT DIET; Clear Liquid; Advance as tolerated to previous diet   DVT Prophylaxis    Code Status Full Code   Disposition Discharge once cleared by gastroenterology         Personally reviewed Lab Studies and Imaging         Subjective:     Chief Complaint:     Michael Knight is a 68 y.o. male who presents with resting this morning had increased work of breathing post EGD      Review of Systems:      Pertinent positives and negatives discussed in HPI    Objective:   No intake or output data in the 24 hours ending 01/17/24 1345     Vitals:   Vitals:    01/16/24 2000 01/17/24 0533 01/17/24 1207 01/17/24 1222   BP: 113/70   110/75   Pulse: 68  68 54   Resp: 18  18 18    Temp: 97.4 F (36.3 C)   97.5 F (36.4 C)   TempSrc: Oral   Oral   SpO2: 95%  96% 98%   Weight:  67 kg (147 lb 12.8 oz)     Height:             Physical Exam:      General: NAD  Eyes: EOMI  ENT: neck supple  Cardiovascular: Regular rate.  Respiratory: Clear to auscultation  Gastrointestinal: Soft, non tender  Genitourinary: no suprapubic tenderness  Musculoskeletal: No edema  Skin: warm, dry  Neuro: Alert.  Psych: Mood appropriate.         Medications:   Medications:    LORazepam    0.5 mg Oral Once    lactulose   20 g Oral TID    pantoprazole  (PROTONIX ) 40 mg in sodium chloride  (PF) 0.9 % 10 mL injection  40 mg IntraVENous Q12H    guaiFENesin   600 mg Oral BID    sodium chloride  flush  5-40 mL IntraVENous 2 times per day    enoxaparin   40 mg SubCUTAneous Daily    doxycycline  hyclate  100 mg Oral 2 times per day    amLODIPine   10 mg Oral Daily    budesonide -formoterol   2 puff Inhalation BID RT    levothyroxine   75 mcg Oral Daily    pravastatin   40 mg Oral Nightly    propranolol   40 mg Oral Daily with breakfast    tamsulosin   0.4 mg Oral Nightly    topiramate   100 mg Oral Nightly  traZODone   75 mg Oral Nightly      Infusions:    sodium chloride        PRN Meds: phenol, 1 spray, Q2H PRN  ipratropium 0.5 mg-albuterol  2.5 mg, 1 Dose, Q4H PRN  sodium chloride  flush, 5-40 mL, PRN  sodium chloride , , PRN  potassium chloride , 40 mEq, PRN   Or  potassium alternative oral replacement, 40 mEq, PRN   Or  potassium chloride , 10 mEq, PRN  magnesium  sulfate, 2,000 mg, PRN  polyethylene glycol, 17 g, Daily PRN  acetaminophen , 650 mg, Q6H PRN   Or  acetaminophen , 650 mg, Q6H PRN  ondansetron , 4 mg, Q8H PRN   Or  ondansetron , 4 mg, Q6H PRN  dextromethorphan-guaiFENesin , 1 tablet, BID PRN  benzonatate , 100 mg, TID PRN  albuterol  sulfate HFA, 2 puff, 4x Daily PRN  Ubrogepant , 100 mg, Daily PRN        Labs and Imaging   EGD  Result Date: 01/16/2024  Patient: Michael Knight, Michael Knight MRN: [id number] DOB: 08-01-1955 Account: [id number] Sex at Birth: Male Age: 32 Years Procedure: Upper GI endoscopy Date: 01/16/2024 Attending Physician: FREDIA DOSS Indications:        - GERD Medications:        -  Monitored Anesthesia Care        -  See the Anesthesia note for documentation of the administered           medications Complications:        -  No immediate complications. Estimated Blood Loss:        -  Estimated blood loss: None. Procedure:        - The scope was introduced through the mouth and advanced to the           fundus of the stomach.         -  The upper GI endoscopy was accomplished without difficulty.        -  The patient tolerated the procedure well. Findings:        - The scope was advanced into the oropharynx and then into the           esophagus.  As soon as the stomach was intubated significant amount           of retained food noted.  At this point it was felt unsafe to continue           the procedure.  The procedure was aborted and the scope was withdrawn           and the procedure was terminated Impression:        - Significant amount of retained food in the stomach suggesting           gastroparesis. This can explain acid regurgitation symptoms patient           has been complaining Recommendation:        - Gastric emptying study        - Continue pantoprazole         - Resume previous diet        - Follow-up in office in 6 to 8 weeks        - Please call with questions Procedure Code(s):        - 43200, Esophagoscopy, flexible, transoral; diagnostic, including           collection of specimen(s) by brushing or washing, when performed           (separate procedure)  CPT(R) - 2023 copyright [employer]. All Rights Reserved.       The CPT codes, CCI edits and ICD codes generated are intended as       suggestions and were generated based on input data.  These codes are       preliminary and upon coder review may be revised to meet current       compliance and payer requirements.  The provider is responsible for       the final determination of appropriate codes, and modifiers. Diagnosis Code(s): RAVI JULURI RAVI JULURI This document has been electronically signed. Note Initiated:01/16/2024 Note Completed:01/16/2024 9:27 AM    CT CHEST PULMONARY EMBOLISM W CONTRAST  Result Date: 01/14/2024  EXAMINATION: CTA OF THE CHEST 01/14/2024 5:20 pm TECHNIQUE: CTA of the chest was performed after the administration of intravenous contrast.  Multiplanar reformatted images are provided for review.  MIP images are provided for review. Automated  exposure control, iterative reconstruction, and/or weight based adjustment of the mA/kV was utilized to reduce the radiation dose to as low as reasonably achievable. COMPARISON: 09/13/2020 HISTORY: ORDERING SYSTEM PROVIDED HISTORY: sob ro pe TECHNOLOGIST PROVIDED HISTORY: Reason for exam:->sob ro pe Additional Contrast?->1 Reason for Exam: sob ro pe FINDINGS: Pulmonary Arteries: Pulmonary arteries are adequately opacified for evaluation.  No evidence of intraluminal filling defect to suggest pulmonary embolism.  Main pulmonary artery is normal in caliber. Mediastinum: Scattered normal size mediastinal lymph nodes are similar to the prior exam.  The heart and pericardium demonstrate no acute abnormality. There is no acute abnormality of the thoracic aorta. Lungs/pleura: There is image degradation due to patient motion.  There ways are patent.  There are no acute infiltrates or pleural effusions.  A nodular density in the inferior lingula is unchanged from 2022 and consistent with a benign process. Upper Abdomen: Limited images of the upper abdomen are unremarkable. Soft Tissues/Bones: No acute bone or soft tissue abnormality.     No evidence of pulmonary embolism or acute pulmonary abnormality.       CBC:   Recent Labs     01/14/24  1421   WBC 5.7   HGB 13.8   PLT 145     BMP:    Recent Labs     01/14/24  1421   NA 141   K 4.2   CL 110   CO2 20*   BUN 10   CREATININE 1.2   GLUCOSE 102*     Hepatic: No results for input(s): AST, ALT, BILITOT, ALKPHOS in the last 72 hours.    Invalid input(s): ALB  Lipids:   Lab Results   Component Value Date/Time    CHOL 202 03/01/2023 09:57 AM    HDL 48 03/01/2023 09:57 AM    TRIG 137 03/01/2023 09:57 AM     Hemoglobin A1C:   Lab Results   Component Value Date/Time    LABA1C 6.1 02/22/2023 02:38 PM     TSH:   Lab Results   Component Value Date/Time    TSH 0.14 03/01/2023 09:57 AM     Troponin: No results found for: TROPONINT  Lactic Acid: No results for input(s): LACTA  in the last 72 hours.  BNP:   Recent Labs     01/14/24  1421   PROBNP 153*     UA:  Lab Results   Component Value Date/Time    NITRU Negative 12/28/2020 05:00 PM    COLORU Yellow 12/28/2020 05:00 PM    PHUR 8.5  12/28/2020 05:00 PM    CLARITYU Clear 12/28/2020 05:00 PM    LEUKOCYTESUR Negative 12/28/2020 05:00 PM    UROBILINOGEN 0.2 12/28/2020 05:00 PM    BILIRUBINUR Negative 12/28/2020 05:00 PM    BLOODU Negative 12/28/2020 05:00 PM    GLUCOSEU Negative 12/28/2020 05:00 PM    KETUA Negative 12/28/2020 05:00 PM     Urine Cultures: No results found for: LABURIN  Blood Cultures:   Lab Results   Component Value Date/Time    [facility]  01/14/2024 02:21 PM     No Growth to date.  Any change in status will be called.     Lab Results   Component Value Date/Time    BLOODCULT2  01/14/2024 03:04 PM     No Growth to date.  Any change in status will be called.     Organism:   Lab Results   Component Value Date/Time    [facility] Saccharomyces cerevisiae 10/28/2020 07:49 AM    ORG Sterile Mycelium 10/28/2020 07:49 AM         Electronically signed by Kimberley Blanch, MD on 01/17/2024 at 1:45 PM

## 2024-01-17 NOTE — Progress Notes (Signed)
Patient to gastric emptying study.

## 2024-01-17 NOTE — Plan of Care (Signed)
 Problem: Pain  Goal: Verbalizes/displays adequate comfort level or baseline comfort level  Outcome: Progressing     Problem: Safety - Adult  Goal: Free from fall injury  Outcome: Progressing     Problem: ABCDS Injury Assessment  Goal: Absence of physical injury  Outcome: Progressing

## 2024-01-18 LAB — CULTURE, BLOOD 1: Blood Culture, Routine: NO GROWTH

## 2024-01-18 LAB — CULTURE, BLOOD 2: Culture, Blood 2: NO GROWTH

## 2024-01-18 MED ORDER — METOCLOPRAMIDE HCL 10 MG PO TABS
10 | Freq: Three times a day (TID) | ORAL | Status: DC
Start: 2024-01-18 — End: 2024-01-18
  Administered 2024-01-18: 14:00:00 10 mg via ORAL

## 2024-01-18 MED FILL — DOXYCYCLINE HYCLATE 100 MG PO TABS: 100 mg | ORAL | Qty: 1 | Fill #0

## 2024-01-18 MED FILL — PRAVASTATIN SODIUM 40 MG PO TABS: 40 mg | ORAL | Qty: 1 | Fill #0

## 2024-01-18 MED FILL — MUCINEX 600 MG PO TB12: 600 mg | ORAL | Qty: 1 | Fill #0

## 2024-01-18 MED FILL — PROTONIX 40 MG IV SOLR: 40 mg | INTRAVENOUS | Qty: 40 | Fill #0

## 2024-01-18 MED FILL — METOCLOPRAMIDE HCL 10 MG PO TABS: 10 mg | ORAL | Qty: 1 | Fill #0

## 2024-01-18 MED FILL — LACTULOSE 10 GM/15ML PO SOLN: 10 GM/15ML | ORAL | Qty: 30 | Fill #0

## 2024-01-18 MED FILL — TOPIRAMATE 100 MG PO TABS: 100 mg | ORAL | Qty: 1 | Fill #0

## 2024-01-18 MED FILL — TRAZODONE HCL 50 MG PO TABS: 50 mg | ORAL | Qty: 2 | Fill #0

## 2024-01-18 MED FILL — FLOMAX 0.4 MG PO CAPS: 0.4 mg | ORAL | Qty: 1 | Fill #0

## 2024-01-18 NOTE — Progress Notes (Signed)
 [facility] Pul[facility]/CCM Progress note      Admit Date: 01/14/2024    Chief Complaint: Shortness of breath, cough and heartburn    Subjective:     Interval History: Large family at bedside today.  Patient states that his breathing and cough is better.  Still has some gurgling noises in his abdomen associated with heartburn.    Scheduled Meds:   LORazepam   0.5 mg Oral Once    lactulose   20 g Oral TID    pantoprazole  (PROTONIX ) 40 mg in sodium chloride  (PF) 0.9 % 10 mL injection  40 mg IntraVENous Q12H    guaiFENesin   600 mg Oral BID    sodium chloride  flush  5-40 mL IntraVENous 2 times per day    enoxaparin   40 mg SubCUTAneous Daily    doxycycline  hyclate  100 mg Oral 2 times per day    amLODIPine   10 mg Oral Daily    budesonide -formoterol   2 puff Inhalation BID RT    levothyroxine   75 mcg Oral Daily    pravastatin   40 mg Oral Nightly    propranolol   40 mg Oral Daily with breakfast    tamsulosin   0.4 mg Oral Nightly    topiramate   100 mg Oral Nightly    traZODone   75 mg Oral Nightly         Continuous Infusions:     sodium chloride        PRN Meds:  phenol, ipratropium 0.5 mg-albuterol  2.5 mg, sodium chloride  flush, sodium chloride , potassium chloride  **OR** potassium alternative oral replacement **OR** potassium chloride , magnesium  sulfate, polyethylene glycol, acetaminophen  **OR** acetaminophen , ondansetron  **OR** ondansetron , dextromethorphan-guaiFENesin , benzonatate , albuterol  sulfate HFA, Ubrogepant      Review of Systems  Constitutional: Fatigue and malaise  Ears, nose, mouth, throat: negative for ear drainage, epistaxis, hoarseness, nasal congestion, sore throat and voice change  Respiratory: negative except for cough and shortness of breath  Cardiovascular: negative for chest pain, chest pressure/discomfort, irregular heart beat, lower extremity edema and palpitations  Gastrointestinal: negative for abdominal pain, constipation, diarrhea, jaundice, melena, odynophagia, reflux symptoms and  vomiting  Hematologic/lymphatic: negative for bleeding, easy bruising, lymphadenopathy and petechiae  Musculoskeletal:negative for arthralgias, bone pain, muscle weakness, neck pain and stiff joints  Neurological: negative for dizziness, gait problems, headaches, seizures, speech problems, tremors and weakness  Behavioral/Psych: negative for anxiety, behavior problems, depression, fatigue and sleep disturbance  Endocrine: negative for diabetic symptoms including none, neuropathy, polyphagia, polyuria, polydipsia, vomiting and diarrhea and temperature intolerance  Allergic/Immunologic: negative for anaphylaxis, angioedema, hay fever and urticaria    Objective:     Patient Vitals for the past 8 hrs:   BP Temp Temp src Pulse Resp SpO2   01/18/24 0956 122/74 97.5 F (36.4 C) Oral 50 16 98 %     I/O last 3 completed shifts:  In: 480 [P.O.:480]  Out: -   No intake/output data recorded.    General Appearance: alert , in no acute distress  Skin: warm and dry, no rash or erythema  Head: normocephalic and atraumatic  Eyes: pupils equal, round, and reactive to light, extraocular eye movements intact, conjunctivae normal  ENT: external ear and ear canal normal bilaterally, nose without deformity, nasal mucosa and turbinates normal  Neck: supple and non-tender without mass, no cervical lymphadenopathy  Pulmonary/Chest: clear to auscultation bilaterally- no wheezes, rales or rhonchi, normal air movement, no respiratory distress  Cardiovascular: normal rate, regular rhythm,  no murmurs, rubs, distal pulses intact, no carotid bruits  Abdomen: soft, non-tender, non-distended, normal  bowel sounds, no masses or organomegaly  Lymph Nodes: Cervical, supraclavicular normal  Extremities: no cyanosis, clubbing or edema  Musculoskeletal: normal range of motion, no joint swelling, deformity or tenderness  Neurologic: alert, no focal neurologic deficits    Data Review:  CBC:   Lab Results   Component Value Date/Time    WBC 5.7 01/14/2024  02:21 PM    RBC 4.35 01/14/2024 02:21 PM     BMP:   Lab Results   Component Value Date/Time    GLUCOSE 102 01/14/2024 02:21 PM    CO2 20 01/14/2024 02:21 PM    BUN 10 01/14/2024 02:21 PM    CREATININE 1.2 01/14/2024 02:21 PM    CALCIUM 9.1 01/14/2024 02:21 PM     ABG: No results found for: HCO3ART, BEART, O2SATART, PHART, THGBART, PCO2ART, PO2ART, TCO2ART    Radiology: All pertinent images / reports were reviewed as a part of this visit.    CTA OF THE CHEST 01/14/2024 5:20 pm     TECHNIQUE:  CTA of the chest was performed after the administration of intravenous  contrast.  Multiplanar reformatted images are provided for review.  MIP  images are provided for review. Automated exposure control, iterative  reconstruction, and/or weight based adjustment of the mA/kV was utilized to  reduce the radiation dose to as low as reasonably achievable.     COMPARISON:  09/13/2020     HISTORY:  ORDERING SYSTEM PROVIDED HISTORY: sob ro pe  TECHNOLOGIST PROVIDED HISTORY:  Reason for exam:->sob ro pe  Additional Contrast?->1  Reason for Exam: sob ro pe     FINDINGS:  Pulmonary Arteries: Pulmonary arteries are adequately opacified for  evaluation.  No evidence of intraluminal filling defect to suggest pulmonary  embolism.  Main pulmonary artery is normal in caliber.     Mediastinum: Scattered normal size mediastinal lymph nodes are similar to the  prior exam.  The heart and pericardium demonstrate no acute abnormality.  There is no acute abnormality of the thoracic aorta.     Lungs/pleura: There is image degradation due to patient motion.  There ways  are patent.  There are no acute infiltrates or pleural effusions.  A nodular  density in the inferior lingula is unchanged from 2022 and consistent with a  benign process.     Upper Abdomen: Limited images of the upper abdomen are unremarkable.     Soft Tissues/Bones: No acute bone or soft tissue abnormality.     IMPRESSION:  No evidence of pulmonary embolism or acute  pulmonary abnormality.    Problem List:     ?  Asthmatic bronchitis with acute exacerbation  History of peptic ulcer disease/GERD  Dysphagia    Assessment/Plan:     Asthmatic bronchitis with acute exacerbation.  Poor response to inhaler therapy, antibiotics and steroids.    History of peptic ulcer disease with GERD.  EGD attempted  which revealed significant food particles in the stomach with distention suggestive of gastroparesis.  Continue Protonix  40 mg twice daily.    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  Started on Reglan  by GI.    Will need outpatient GI evaluation and possibly repeat EGD in a few weeks.    I will organize follow-up with me in 1 month.    Laurance VEAR Leu, MD

## 2024-01-18 NOTE — Discharge Summary (Signed)
 Internal Medicine Resident  Discharge Summary      Name:  Michael Knight  Gender: male  DOB: 12-12-1955  68 y.o.  MRN: [id number]    PCP: Vannie Cara KIDD, MD     Date of Admission:  01/14/2024  2:01 PM  Discharge Date: 01/18/2024    Admitting Physician: Kimberley Blanch, MD  Discharge Physician: Therisa Chew, MD    Communication to PCP  -Patient has been discharged with Pantoprazole  and Metoclopromide for GERD and Gastroparesis   -Dietary education for gastroparesis  -Follow-up with pulmonology appointment    Discharge Diagnoses:  Acute COPD exacerbation  Gastroparesis  GERD     Active Hospital Problems    Diagnosis     Asthmatic bronchitis [J45.909]     COPD exacerbation (HCC) [J44.1]     Gastroesophageal reflux disease [K21.9]        The patient was seen and examined on day of discharge and this discharge summary is in conjunction with any daily progress note from day of discharge.    Hospital Course:  Michael Knight is a 68 y.o. year old male who presented to [facility] on 01/14/2024  2:01 PM.      68 y.o. male with PMHx of COPD, HTN, hypothyroidism, BPH, latent TB who was admitted with complaints of cough with sputum and worsening shortness of breath, referred from the pulmonology office; for acute exacerbation of COPD. Sats were within normal limits in the ED, with wheeze present on exam. Patient started on DuoNeb  and doxycycline , VBG 7.35/38 point 8/117/20 1.5. Patient started on IV methylprednisolone  40 mg BID, Symbicort  every 4 hours. Respiratory panel and influenza test negative. CT PE ruled out pulmonary embolism. He has persisting complaints of difficulty swallowing and food getting stuck, GI consulted, EGD(01/16/2024) done, showing Gastroparesis. GES(01/17/2024) revealed delayed gastric emptying.  Patient's respiratory symptoms improved with steroid and nebulization.  Patient was discharged with Reglan  and pantoprazole  for GERD and gastroparesis.    On the last day of hospital stay, 01/18/2024.  The patient expressed  appropriate understanding of and agreement with the discharge recommendations, medications, and plan.      Physical Exam Performed:     BP 122/74   Pulse 50   Temp 97.5 F (36.4 C) (Oral)   Resp 16   Ht 1.575 m (5' 2)   Wt 67.1 kg (147 lb 14.4 oz)   SpO2 98%   BMI 27.05 kg/m       General appearance:  No apparent distress, appears stated age and cooperative.  HEENT:  Normal cephalic, atraumatic without obvious deformity. Pupils equal, round, and reactive to light.  Extra ocular muscles intact. Conjunctivae/corneas clear.  Neck: Supple, with full range of motion. No jugular venous distention. Trachea midline.  Respiratory:  Normal respiratory effort. Clear to auscultation, bilaterally without Rales/Wheezes/Rhonchi.  Cardiovascular:  Regular rate and rhythm with normal S1/S2 without murmurs, rubs or gallops. No periphearal edema.  Abdomen: Soft, non-tender, non-distended with normal bowel sounds.  Musculoskeletal:  No clubbing or cyanosis. Full range of motion without deformity.  Skin: Skin color, texture, turgor normal.  No rashes or lesions.  Neurologic:  Neurovascularly intact without any focal sensory/motor deficits. Cranial nerves: II-XII intact, grossly non-focal.  Psychiatric:  Alert and oriented, thought content appropriate, normal insight  Capillary Refill: Brisk, 3 seconds, normal   Peripheral Pulses: +2 palpable, equal bilaterally      Labs: For convenience and continuity at follow-up the following most recent labs are provided:  CBC:    Lab Results   Component Value Date/Time    WBC 5.7 01/14/2024 02:21 PM    HGB 13.8 01/14/2024 02:21 PM    HCT 39.8 01/14/2024 02:21 PM    PLT 145 01/14/2024 02:21 PM       Renal:    Lab Results   Component Value Date/Time    NA 141 01/14/2024 02:21 PM    K 4.2 01/14/2024 02:21 PM    CL 110 01/14/2024 02:21 PM    CO2 20 01/14/2024 02:21 PM    BUN 10 01/14/2024 02:21 PM    CREATININE 1.2 01/14/2024 02:21 PM    CALCIUM 9.1 01/14/2024 02:21 PM    PHOS 3.3  12/30/2020 04:25 AM         Significant Diagnostic Studies    Radiology:   NM GASTRIC EMPTYING   Final Result   1. Delayed gastric emptying.            CT CHEST PULMONARY EMBOLISM W CONTRAST   Final Result   No evidence of pulmonary embolism or acute pulmonary abnormality.                Consults:     IP CONSULT TO PULMONOLOGY  IP CONSULT TO GI  IP CONSULT TO DIETITIAN  IP CONSULT TO HOME CARE NEEDS    F/U APPTS:  Toniann Ranks, MD  [address]  [place] [address]  [phone]    Schedule an appointment as soon as possible for a visit in 1 week(s)      Lanning Laurance DEL, MD  [address]  [place] [address]  [phone]    Schedule an appointment as soon as possible for a visit in 1 week(s)        Diet:  low fat, low cholesterol diet     Disposition:  Discharged to Home    Condition at Discharge: Stable    Code Status:  Full Code     Discharge Medications:     Current Discharge Medication List             Details   metoclopramide  (REGLAN ) 5 MG tablet Take 1 tablet by mouth 3 times daily  Qty: 120 tablet, Refills: 0                Details   pantoprazole  (PROTONIX ) 40 MG tablet Take 1 tablet by mouth in the morning and at bedtime  Qty: 60 tablet, Refills: 0    Associated Diagnoses: Gastroesophageal reflux disease without esophagitis                Details   levothyroxine  (SYNTHROID ) 75 MCG tablet Take 1 tablet by mouth Daily      pravastatin  (PRAVACHOL ) 40 MG tablet Take 1 tablet by mouth nightly      tamsulosin  (FLOMAX ) 0.4 MG capsule Take 1 capsule by mouth nightly      traZODone  (DESYREL ) 50 MG tablet Take 1 tablet by mouth nightly 1 and 1/2 tabs  Qty: 45 tablet, Refills: 5    Associated Diagnoses: Insomnia, unspecified type      Ubrogepant  (UBRELVY ) 100 MG TABS Take 100 mg by mouth daily as needed (migraine headaches)  Qty: 16 tablet, Refills: 5    Associated Diagnoses: Migraine with aura and without status migrainosus, not intractable      Dextromethorphan-guaiFENesin   (CORICIDIN HBP CONGESTION/COUGH) 10-200 MG CAPS Take 1 capsule by mouth in the morning, at noon, in the evening, and at  bedtime  Qty: 20 capsule, Refills: 0    Associated Diagnoses: Chronic cough; Pulmonary emphysema, unspecified emphysema type (HCC)      meclizine  (ANTIVERT ) 12.5 MG tablet TAKE 1 TABLET BY MOUTH 3 TIMES A DAY AS NEEDED FOR DIZZINESS  Qty: 90 tablet, Refills: 3    Associated Diagnoses: Vertigo      albuterol  sulfate HFA (PROVENTIL ;VENTOLIN ;PROAIR ) 108 (90 Base) MCG/ACT inhaler Inhale 2 puffs into the lungs 4 times daily as needed for Wheezing  Qty: 1 each, Refills: 11    Associated Diagnoses: Chronic cough      fluticasone -salmeterol (ADVAIR  DISKUS) 250-50 MCG/ACT AEPB diskus inhaler Inhale 1 puff into the lungs in the morning and 1 puff in the evening.  Qty: 60 each, Refills: 5      propranolol  (INDERAL ) 40 MG tablet TAKE 1 TABLET BY MOUTH EVERY DAY WITH BREAKFAST  Qty: 30 tablet, Refills: 11    Associated Diagnoses: Essential hypertension; Migraine with aura and without status migrainosus, not intractable      magnesium  gluconate (MAGONATE) 500 MG tablet TAKE 1 TABLET BY MOUTH TWICE A DAY  Qty: 60 tablet, Refills: 5    Associated Diagnoses: Migraine with aura and without status migrainosus, not intractable      amLODIPine  (NORVASC ) 10 MG tablet Take 1 tablet by mouth daily  Qty: 30 tablet, Refills: 11    Associated Diagnoses: Essential hypertension      topiramate  (TOPAMAX ) 100 MG tablet Take 1 tablet by mouth nightly  Qty: 60 tablet, Refills: 5    Associated Diagnoses: Migraine with aura and without status migrainosus, not intractable      Incontinence Supply Disposable (DEPEND ADJUSTABLE UNDERWEAR) MISC 1 each by Does not apply route in the morning, at noon, and at bedtime  Qty: 90 each, Refills: 5    Associated Diagnoses: Mixed incontinence      Misc. Devices (CANE) MISC 1 each by Does not apply route daily  Qty: 1 each, Refills: 0    Associated Diagnoses: Difficulty walking                  Signed:    Therisa Chew, MD   01/18/2024  1:38 PM

## 2024-01-18 NOTE — Care Coordination-Inpatient (Signed)
 Case Management -  Discharge Note      Patient Name: Michael Knight                   Date of Birth: Aug 16, 1955  Room: 3AN-3325/3325-01            Readmission Risk (Low < 19, Mod (19-27), High > 27): Readmission Risk Score: 8.2    Current PCP: Vannie Cara KIDD, MD      (IMM) Important Message from Medicare:    Has pt received appropriate compliance notices before being discharged if required: yes  Compliance doc:  []  2nd IMM; []  Code 44 []  [place]  Date Given: 01/18/2024 Given By: GORMAN Brought    PT AM-PAC:   /24  OT AM-PAC:   /24    Patient/patient representative has been educated on the benefits of HHC  as well as the possible risks of declining recommended services. Patient/patient representative has acknowledged the information provided and decided on the following discharge plan. Patient/ patient representative has been provided freedom of choice regarding service provider, supported by basic dialogue that supports the patient's individualized plan of care/goals.    Home Care Information:   Is patient resuming current home health care services: Yes     Home Care Agency:   [facility]  Phone: [phone]  Fax: [phone]          [phone]          Home health agency notified of discharge.       HHC agency notified of discharge:  [x]  Yes []  No  []  NA    Family notified of discharge:  [x]  Yes  []  No  []  NA    If yes, make sure COC is faxed to [facility] agency, and meds are called in to pharmacy by RN from COC orders only.      Financial    Payor: [employer]HC MEDICARE / Plan: DREMA CAPES COMPLETE / Product Type: *No Product type* /     Pharmacy:  Potential assistance Purchasing Medications: No  [id number] [place] - [address] - [place] [phone] [place] [phone]  [address] [place] [address]  Phone: [phone] Fax: [phone]      Notes:    Additional Case Management Notes: Pt is being discharged to home w family ;  RN CM contacted  [facility] to alert them about pt being discharged.      Electronically signed by Jon SIEMENS, RN on 01/18/24 at 12:15 PM EDT

## 2024-01-18 NOTE — Plan of Care (Signed)
 Problem: Discharge Planning  Goal: Discharge to home or other facility with appropriate resources  Outcome: Progressing     Problem: Pain  Goal: Verbalizes/displays adequate comfort level or baseline comfort level  Outcome: Progressing     Problem: Safety - Adult  Goal: Free from fall injury  Outcome: Progressing     Problem: ABCDS Injury Assessment  Goal: Absence of physical injury  Outcome: Progressing

## 2024-01-18 NOTE — Discharge Instructions (Addendum)
 Continuity of Care Form    Patient Name: Michael Knight   DOB:  January 21, 1956  MRN:  [id number]    Admit date:  01/14/2024  Discharge date:  ***    Code Status Order: Full Code   Advance Directives:    Date/Time Healthcare Directive Type of Healthcare Directive Copy in Chart Healthcare Agent Appointed Healthcare Agent's Name Healthcare Agent's Phone Number    01/16/24 [phone] No, patient does not have an advance directive for healthcare treatment  --  --  --  --  --             Admitting Physician:  Kimberley Blanch, MD  PCP: Vannie Cara KIDD, MD    Discharging Nurse: [facility] Unit/Room#: 3AN-3325/3325-01  Discharging Unit Phone Number: ***    Emergency Contact:   Extended Emergency Contact Information  Primary Emergency Contact: [facility]  Home Phone: [phone]  Relation: Child  Secondary Emergency Contact: [name],[name]  Home Phone: [phone]  Relation: Child    Past Surgical History:  Past Surgical History:   Procedure Laterality Date    BRONCHOSCOPY N/A 10/28/2020    BRONCHOSCOPY ALVEOLAR LAVAGE performed by Laurance VEAR Leu, MD at [facility] ENDOSCOPY    ELBOW SURGERY Right     HAND SURGERY      UPPER GASTROINTESTINAL ENDOSCOPY N/A 02/08/2018    EGD BIOPSY performed by Garnette SHAUNNA Lunger, MD at [facility] ENDOSCOPY    UPPER GASTROINTESTINAL ENDOSCOPY N/A 01/16/2024    ESOPHAGOGASTRODUODENOSCOPY DIAGNOSTIC ONLY performed by Toniann Ranks, MD at [facility] ENDOSCOPY       Immunization History:   Immunization History   Administered Date(s) Administered    COVID-19, PFIZER PURPLE top, DILUTE for use, (age 49 y+), 37mcg/0.3mL 08/25/2019, 09/15/2019, 06/09/2020    Influenza, FLUAD, (age 40 y+), IM, Trivalent PF, 0.49mL 04/13/2016    Influenza, FLUBLOK, (age 42 y+), Quadv PF, 0.58mL 05/09/2019       Active Problems:  Patient Active Problem List   Diagnosis Code    Chest pain R07.9    Chronic cough R05.3    Generalized abdominal pain R10.84    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    Syncope and collapse R55    Essential  hypertension I10    Dyslipidemia E78.5    Gastroesophageal reflux disease K21.9    Acquired hypothyroidism E03.9    Migraine with aura and without status migrainosus, not intractable G43.109    Prediabetes R73.03    Difficulty walking R26.2    Right foot pain M79.671    Nocturia R35.1    Mixed incontinence N39.46    Pulmonary emphysema (HCC) J43.9    COPD exacerbation (HCC) J44.1    Asthmatic bronchitis J45.909       Isolation/Infection:   Isolation            No Isolation          Patient Infection Status    None to display              Nurse Assessment:  Last Vital Signs: BP 122/74   Pulse 50   Temp 97.5 F (36.4 C) (Oral)   Resp 16   Ht 1.575 m (5' 2)   Wt 67.1 kg (147 lb 14.4 oz)   SpO2 98%   BMI 27.05 kg/m     Last documented pain score (0-10 scale): Pain Level: 5  Last Weight:   Wt Readings from Last 1 Encounters:   01/18/24 67.1 kg (147 lb 14.4 oz)  Mental Status:  {IP PT MENTAL STATUS:20030}    IV Access:  {MH COC IV ACCESS:304088262}    Nursing Mobility/ADLs:  Walking   {CHP DME JIOd:695911956}  Transfer  {CHP DME JIOd:695911956}  Bathing  {CHP DME JIOd:695911956}  Dressing  {CHP DME JIOd:695911956}  Toileting  {CHP DME JIOd:695911956}  Feeding  {CHP DME JIOd:695911956}  Med Admin  {CHP DME JIOd:695911956}  Med Delivery   {MH COC MED Delivery:304088264}    Wound Care Documentation and Therapy:        Elimination:  Continence:   Bowel: {YES / WN:80272}  Bladder: {YES / WN:80272}  Urinary Catheter: {Urinary Catheter:304088013}   Colostomy/Ileostomy/Ileal Conduit: {YES / WN:80272}       Date of Last BM: ***    Intake/Output Summary (Last 24 hours) at 01/18/2024 1205  Last data filed at 01/18/2024 0517  Gross per 24 hour   Intake 480 ml   Output --   Net 480 ml     I/O last 3 completed shifts:  In: 480 [P.O.:480]  Out: -     Safety Concerns:     {MH COC Safety Concerns:304088272}    Impairments/Disabilities:      {MH COC Impairments/Disabilities:304088273}    Nutrition Therapy:  Current Nutrition  Therapy:   {MH COC Diet List:304088271}    Routes of Feeding: {CHP DME Other Feedings:304088042}  Liquids: {Slp liquid thickness:30034}  Daily Fluid Restriction: {CHP DME Yes amt example:304088041}  Last Modified Barium Swallow with Video (Video Swallowing Test): {Done Not Done Ijuz:695911987}    Treatments at the Time of Hospital Discharge:   Respiratory Treatments: ***  Oxygen Therapy:  {Therapy; copd oxygen:17808}  Ventilator:    {MH CC Vent Opdu:695911888}    Rehab Therapies: {THERAPEUTIC INTERVENTION:702-552-8024}  Weight Bearing Status/Restrictions: {MH CC Weight Bearing:304508812}  Other Medical Equipment (for information only, NOT a DME order):  {EQUIPMENT:304520077}  Other Treatments: ***    Patient's personal belongings (please select all that are sent with patient):  {CHP DME Belongings:304088044}    RN SIGNATURE:  {Esignature:304088025}    CASE MANAGEMENT/SOCIAL WORK SECTION    Inpatient Status Date: discharge  01/18/2024     Readmission Risk Assessment Score:  [facility] RISK OF UNPLANNED READMISSION 2.0             8.3 Total Score        Discharging to Facility/ Agency   [facility]  Phone: [phone]  Fax: [phone]          [phone]     Case Manager/Social Worker signature: Electronically signed by Jon SIEMENS, RN on 01/18/24 at 12:05 PM EDT    PHYSICIAN SECTION    Prognosis: Good    Condition at Discharge: Stable    Rehab Potential (if transferring to Rehab): Good    Recommended Labs or Other Treatments After Discharge:     Physician Certification: I certify the above information and transfer of Michael Knight  is necessary for the continuing treatment of the diagnosis listed and that he requires Home Care for greater 30 days.     Update Admission H&P: No change in H&P    PHYSICIAN SIGNATURE:  Electronically signed by Kimberley Blanch, MD on 01/18/24 at 12:10 PM EDT

## 2024-01-18 NOTE — Consults (Signed)
 Session ID: [id number]  Session Duration: 13 minutes  Language: Nepali  Interpreter ID: #[id number]  Interpreter Name: Erma

## 2024-01-18 NOTE — Care Coordination-Inpatient (Signed)
 01/18/24 1054   IMM Letter   IMM Letter given to Patient/Family/Significant other/Guardian/POA/by: IMM Given   IMM Letter date given: 01/18/24   IMM Letter time given: 9144

## 2024-01-18 NOTE — Consults (Signed)
 Nutrition Education    Educated on gastroparesis diet guidelines  Learners: Patient  Readiness: Acceptance  Method: Explanation, Handout, and Interpreter  Response: Verbalizes Understanding  Contact name and number provided.    TRUMAN CHRISTELLA BOARD, RD, LD  Contact Number: [phone]

## 2024-01-18 NOTE — Progress Notes (Signed)
 Gastroenterology Progress Note      Michael Knight is a 68 y.o. male patient.  1. Pulmonary emphysema, unspecified emphysema type (HCC)    2. Cough, unspecified type    3. Gastroesophageal reflux disease without esophagitis        SUBJECTIVE:  tolerating liquid diet.         Physical    VITALS:  BP 109/69   Pulse 50   Temp 97.6 F (36.4 C) (Oral)   Resp 14   Ht 1.575 m (5' 2)   Wt 67.1 kg (147 lb 14.4 oz)   SpO2 95%   BMI 27.05 kg/m   TEMPERATURE:  Current - Temp: 97.6 F (36.4 C); Max - Temp  Avg: 97.6 F (36.4 C)  Min: 97.5 F (36.4 C)  Max: 97.7 F (36.5 C)    Constitutional: Alert, comfortable. No acute distress.   Respiratory: Respirations nonlabored and normal muscle movement.    GI: Abdomen nondistended, soft, and nontender.    Neurological: Gross memory appears intact.  Patient is alert and oriented.     Data    Data Review:    No results for input(s): WBC, HGB, HCT, MCV, PLT in the last 72 hours.  No results for input(s): NA, K, CL, CO2, PHOS, BUN, CREATININE in the last 72 hours.    Invalid input(s): CA  No results for input(s): AST, ALT, BILIDIR, BILITOT, ALKPHOS in the last 72 hours.    Invalid input(s): ALB  No results for input(s): LIPASE, AMYLASE in the last 72 hours.  No results for input(s): PROTIME, INR in the last 72 hours.  Invalid input(s): PTT          ASSESSMENT / PLAN      Gastroparesis - GES 01/17/24 was c/w gastroparesis.   - gastroparesis diet: low fat, low fiber diet with frequent, small volume meals.  - Dietitian consult for education  - Start Reglan  10 mg 3 times daily before meals    GERD- has symptoms despite daily protonix . EGD 01/16/24 with significant amount of retained food in stomach. GES c/w gastroparesis which explains his worsening symptoms.   - PPI BID    Asthmatic bronchitis with acute exacerbation -pulmonology following    Will sign off. Please call if questions.    The above assessment and plan was developed  in collaboration with Dr. Toniann Bernarda Petite, PA-C  [facility]

## 2024-01-18 NOTE — Discharge Instructions (Signed)
 Please call and make an appointment with your PCP within 1 week; If you do not have a PCP please make an appointment with the [facility] outpatient resident clinic by calling [phone]  Please call and make an appointment with Pulmonology and GI  Please take all your medications as prescribed including any new ones on discharge

## 2024-01-21 NOTE — Telephone Encounter (Signed)
 [facility] called in to see if you are willing to follow the patient for PT and OT.     They already have the orders, just need verification that Dr. Vannie will follow.

## 2024-01-21 NOTE — Telephone Encounter (Signed)
 Care Transitions Initial Follow Up Call    Outreach made within 2 business days of discharge: Yes    Patient: Michael Knight Patient DOB: [birth year]   MRN: [id number]  Reason for Admission: COPD exacerbation  Discharge Date: 01/18/24       Spoke with: patient and son in law    Discharge department/facility: [facility]    TCM Interactive Patient Contact:  Was patient able to fill all prescriptions: Yes  Was patient instructed to bring all medications to the follow-up visit: Yes  Is patient taking all medications as directed in the discharge summary? Yes  Does patient understand their discharge instructions: Yes  Does patient have questions or concerns that need addressed prior to 7-14 day follow up office visit: no    Additional needs identified to be addressed with provider  No needs identified             Scheduled appointment with PCP within 7-14 days    Follow Up  Future Appointments   Date Time Provider Department Center   01/28/2024  4:30 PM Vannie Cara KIDD, MD [place] [facility] ECC DEP   02/26/2024  1:45 PM Lanning Laurance DEL, MD PUL[facility]   03/03/2024 11:00 AM Vannie Cara KIDD, MD [place] [facility] DEP       Barnie Oliphant, LPN

## 2024-01-22 ENCOUNTER — Ambulatory Visit
Admit: 2024-01-22 | Discharge: 2024-01-22 | Payer: Medicare (Managed Care) | Attending: Internal Medicine | Primary: Internal Medicine

## 2024-01-22 DIAGNOSIS — Z09 Encounter for follow-up examination after completed treatment for conditions other than malignant neoplasm: Principal | ICD-10-CM

## 2024-01-22 LAB — POCT GLYCOSYLATED HEMOGLOBIN (HGB A1C): Hemoglobin A1C: 6.8 %

## 2024-01-22 MED ORDER — BLOOD GLUCOSE MONITOR SYSTEM W/DEVICE KIT
PACK | 0 refills | 33.00000 days | Status: DC
Start: 2024-01-22 — End: 2024-02-26

## 2024-01-22 MED ORDER — BLOOD GLUCOSE TEST VI STRP
ORAL_STRIP | Freq: Every day | 5 refills | 27.50000 days | Status: AC
Start: 2024-01-22 — End: ?

## 2024-01-22 MED ORDER — DEPEND ADJUSTABLE UNDERWEAR MISC
Freq: Three times a day (TID) | 5 refills | Status: AC
Start: 2024-01-22 — End: ?

## 2024-01-22 MED ORDER — METFORMIN HCL ER 750 MG PO TB24
750 | ORAL_TABLET | Freq: Every day | ORAL | 3 refills | 90.00000 days | Status: DC
Start: 2024-01-22 — End: 2024-05-21

## 2024-01-22 MED ORDER — LANCETS MISC
Freq: Every day | 5 refills | 30.00000 days | Status: AC
Start: 2024-01-22 — End: ?

## 2024-01-22 NOTE — Assessment & Plan Note (Signed)
stable on Ubrelvy and Topamax.  Patient also takes magnesium supplement.  Referred to new neurologist

## 2024-01-22 NOTE — Progress Notes (Signed)
 Post-Discharge Transitional Care  Follow Up      Michael Knight   Date of Birth:  12/23/1955    Date of Office Visit:  01/22/2024  Date of Hospital Admission: 01/14/24  Date of Hospital Discharge: 01/18/24  Risk of hospital readmission (high >=14%. 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  Continue Advair  and as needed inhaler.  Patient will continue follow-up with pulmonologist.  Gastroesophageal reflux disease without esophagitis  Assessment & Plan:    stable on Protonix  40 mg daily  Essential hypertension  Assessment & Plan:   Stable  Continue anti-hypertensive medication as documented in your medication list amlodipine  10 mg daily  To verify blood pressure cuff accuracy  To keep outpatient BP log,  counseled on exercise and diet (including DASH diet)  Goal to achieve appropriate BMI  Patient agreed with plan with verbal understanding   Type 2 diabetes mellitus without complication, without long-term current use of insulin (HCC)  Assessment & Plan:   A1C:   Lab Results   Component Value Date/Time    LABA1C 6.8 01/22/2024 01:51 PM    LABA1C 6.1 02/22/2023 02:38 PM     Increase A1c likely from increased prescribing of steroids for COPD exacerbation  We will prescribe metformin  to help lower A1c.  Will titrate dose accordingly.  Prescribed glucometer and test strips  Patient will return in 1 week for Medicare AWV and we will further discuss his diagnosis  Counselled on Diet and exercise with goal to achieve  appropriate BMI  Continue Diabetic medication as documented in medication list  Continue home foot care  Patient agreed with plan with verbal understanding     Orders:  -     POCT glycosylated hemoglobin (Hb A1C)  -     metFORMIN  (GLUCOPHAGE -XR) 750 MG extended release tablet; Take 1 tablet by mouth daily (with breakfast), Disp-30 tablet, R-3Normal  -     blood glucose monitor strips; by Other route daily Test 1 time a day. Alternate between morning fasting blood sugar,  and bedtime (2 hours after eating)., Other, DAILY Starting Tue 01/22/2024, Disp-50 strip, R-5, Normal  -     Blood Glucose Monitoring Suppl (BLOOD GLUCOSE MONITOR SYSTEM) w/Device KIT; Disp-1 kit, R-0, NormalTest 1 time a day. Alternate between morning fasting blood sugar,  and bedtime (2 hours after eating).  -     Lancets MISC; DAILY Starting Tue 01/22/2024, Disp-300 each, R-5, NormalTest 1 time a day. Alternate between morning fasting blood sugar,  and bedtime (2 hours after eating).  Migraine with aura and without status migrainosus, not intractable  Assessment & Plan:    stable on Ubrelvy  and Topamax .  Patient also takes magnesium  supplement.  Referred to new neurologist  Gastroparesis  Assessment & Plan:    diagnosed in the hospital patient tolerating Reglan .  Encounter for abdominal aortic aneurysm (AAA) screening  -     US  SCREENING FOR AAA; Future  Need for pneumococcal vaccination  -     Pneumococcal, PCV20, PREVNAR 20, (age 6w+), IM, PF  Mixed incontinence  -     Incontinence Supply Disposable (DEPEND ADJUSTABLE UNDERWEAR) MISC; 1 each by Does not apply route in the morning, at noon, and at bedtime, Disp-90 each, R-5Print      Medical Decision Making: low complexity  Return in about 1 week (around 01/29/2024) for Medicare AWV.    On this date 01/22/2024 I have spent 30 minutes reviewing previous notes, test results and face to face with the patient discussing the diagnosis and importance of compliance with the treatment plan as well as  documenting on the day of the visit.       Subjective:   HPI:  Follow up of Hospital problems/diagnosis(es): Acute COPD exacerbation  Gastroparesis  GERD    Inpatient course: Discharge summary reviewed- see chart.    Interval history/Current status: 68 y.o. male with PMHx of COPD, HTN, hypothyroidism, BPH, latent TB who was admitted with complaints of cough with sputum and worsening shortness of breath, referred from the pulmonology office; for acute exacerbation of COPD.  Patient started on IV methylprednisolone  40 mg BID, Symbicort  every 4 hours. Respiratory panel and influenza test negative. CT PE ruled out pulmonary embolism. He has persisting complaints of difficulty swallowing and food getting stuck, GI consulted, EGD(01/16/2024) done, showing Gastroparesis. GES(01/17/2024) revealed delayed gastric emptying.  Patient's respiratory symptoms improved with steroid and nebulization.  Patient was discharged with Reglan  and pantoprazole  for GERD and gastroparesis.       Today patient feels better, he is breathing is closer to baseline denies any issues with GI.  Patient Active Problem List   Diagnosis    Chest pain    Chronic cough    Generalized abdominal pain    Latent tuberculosis    Syncope and collapse    Essential hypertension    Dyslipidemia    Gastroesophageal reflux disease    Acquired hypothyroidism    Migraine with aura and without status migrainosus, not intractable    Prediabetes    Difficulty walking    Right foot pain    Nocturia    Mixed incontinence    Pulmonary emphysema (HCC)    COPD exacerbation (HCC)    Asthmatic bronchitis    Gastroparesis    Type 2 diabetes mellitus without complication, without long-term current use of insulin (HCC)       Medications listed as ordered at the time of discharge from hospital     Medication List            Accurate as of January 22, 2024  2:06 PM. If you have any questions, ask your nurse or doctor.                START taking these medications      Blood Glucose  Monitor System w/Device Kit  Test 1 time a day. Alternate between morning fasting blood sugar,  and bedtime (2 hours after eating).  Started by: Dr. Cara Finder, MD     blood glucose test strips  by Other route daily Test 1 time a day. Alternate between morning fasting blood sugar,  and bedtime (2 hours after eating).  Started by: Dr. Cara Finder, MD     Lancets Misc  1 each by Does not apply route daily Test 1 time a day. Alternate between morning fasting blood sugar,  and bedtime (2 hours after eating).  Started by: Dr. Cara Finder, MD  metFORMIN  750 MG extended release tablet  Commonly known as: GLUCOPHAGE -XR  Take 1 tablet by mouth daily (with breakfast)  Started by: Dr. Cara Finder, MD            CONTINUE taking these medications      albuterol  sulfate HFA 108 (90 Base) MCG/ACT inhaler  Commonly known as: PROVENTIL ;VENTOLIN ;PROAIR   Inhale 2 puffs into the lungs 4 times daily as needed for Wheezing     amLODIPine  10 MG tablet  Commonly known as: NORVASC   Take 1 tablet by mouth daily     benzonatate  100 MG capsule  Commonly known as: TESSALON      Cane Misc  1 each by Does not apply route daily     Coricidin HBP Congestion/Cough 10-200 MG Caps  Generic drug: Dextromethorphan-guaiFENesin   Take 1 capsule by mouth in the morning, at noon, in the evening, and at bedtime     Depend Adjustable Underwear Misc  1 each by Does not apply route in the morning, at noon, and at bedtime     fluticasone -salmeterol 250-50 MCG/ACT Aepb diskus inhaler  Commonly known as: Advair  Diskus  Inhale 1 puff into the lungs in the morning and 1 puff in the evening.     * levothyroxine  75 MCG tablet  Commonly known as: SYNTHROID      * levothyroxine  25 MCG tablet  Commonly known as: SYNTHROID      magnesium  gluconate 500 MG tablet  Commonly known as: MAGONATE  TAKE 1 TABLET BY MOUTH TWICE A DAY     meclizine  12.5 MG tablet  Commonly known as: ANTIVERT   TAKE 1 TABLET BY MOUTH 3 TIMES A DAY AS NEEDED FOR DIZZINESS     metoclopramide  5  MG tablet  Commonly known as: Reglan   Take 1 tablet by mouth 3 times daily     pantoprazole  40 MG tablet  Commonly known as: PROTONIX   Take 1 tablet by mouth in the morning and at bedtime     pravastatin  40 MG tablet  Commonly known as: PRAVACHOL      propranolol  40 MG immediate release tablet  Commonly known as: INDERAL   TAKE 1 TABLET BY MOUTH EVERY DAY WITH BREAKFAST     tamsulosin  0.4 MG capsule  Commonly known as: FLOMAX      topiramate  100 MG tablet  Commonly known as: TOPAMAX   Take 1 tablet by mouth nightly     traZODone  50 MG tablet  Commonly known as: DESYREL   Take 1 tablet by mouth nightly 1 and 1/2 tabs     Ubrelvy  100 MG Tabs  Generic drug: Ubrogepant   Take 100 mg by mouth daily as needed (migraine headaches)           * This list has 2 medication(s) that are the same as other medications prescribed for you. Read the directions carefully, and ask your doctor or other care provider to review them with you.                   Where to Get Your Medications        These medications were sent to [facility] [id number] [facility] - [address] - P [id number] [place] [phone]  [address] [place], [place] [address]      Phone: [phone]   Blood Glucose Monitor System w/Device Kit  blood glucose test strips  Lancets Misc  metFORMIN  750 MG extended release tablet       You can get these medications from any pharmacy  Bring a paper prescription for each of these medications  Depend Adjustable Underwear Misc           Medications marked taking at this time  Outpatient Medications Marked as Taking for the 01/22/24 encounter (Office Visit) with Vannie Cara KIDD, MD   Medication Sig Dispense Refill    Incontinence Supply Disposable (DEPEND ADJUSTABLE UNDERWEAR) MISC 1 each by Does not apply route in the morning, at noon, and at bedtime 90 each 5    benzonatate  (TESSALON ) 100 MG capsule Take 1 capsule by mouth Three times daily as needed      levothyroxine  (SYNTHROID ) 25 MCG tablet Take 1 tablet  by mouth Daily      metFORMIN  (GLUCOPHAGE -XR) 750 MG extended release tablet Take 1 tablet by mouth daily (with breakfast) 30 tablet 3    blood glucose monitor strips by Other route daily Test 1 time a day. Alternate between morning fasting blood sugar,  and bedtime (2 hours after eating). 50 strip 5    Blood Glucose Monitoring Suppl (BLOOD GLUCOSE MONITOR SYSTEM) w/Device KIT Test 1 time a day. Alternate between morning fasting blood sugar,  and bedtime (2 hours after eating). 1 kit 0    Lancets MISC 1 each by Does not apply route daily Test 1 time a day. Alternate between morning fasting blood sugar,  and bedtime (2 hours after eating). 300 each 5    pantoprazole  (PROTONIX ) 40 MG tablet Take 1 tablet by mouth in the morning and at bedtime 60 tablet 0    levothyroxine  (SYNTHROID ) 75 MCG tablet Take 1 tablet by mouth Daily      pravastatin  (PRAVACHOL ) 40 MG tablet Take 1 tablet by mouth nightly      traZODone  (DESYREL ) 50 MG tablet Take 1 tablet by mouth nightly 1 and 1/2 tabs 45 tablet 5    Ubrogepant  (UBRELVY ) 100 MG TABS Take 100 mg by mouth daily as needed (migraine headaches) 16 tablet 5    meclizine  (ANTIVERT ) 12.5 MG tablet TAKE 1 TABLET BY MOUTH 3 TIMES A DAY AS NEEDED FOR DIZZINESS 90 tablet 3    Misc. Devices (CANE) MISC 1 each by Does not apply route daily 1 each 0    albuterol  sulfate HFA (PROVENTIL ;VENTOLIN ;PROAIR ) 108 (90 Base) MCG/ACT inhaler Inhale 2 puffs into the lungs 4 times daily as needed for Wheezing 1 each 11    fluticasone -salmeterol (ADVAIR  DISKUS) 250-50 MCG/ACT AEPB diskus inhaler Inhale 1 puff into the lungs in the morning and 1 puff in the evening. 60 each 5    propranolol  (INDERAL ) 40 MG tablet TAKE 1 TABLET BY MOUTH EVERY DAY WITH BREAKFAST 30 tablet 11    magnesium  gluconate (MAGONATE) 500 MG tablet TAKE 1 TABLET BY MOUTH TWICE A DAY 60 tablet 5    amLODIPine  (NORVASC ) 10 MG tablet Take 1 tablet by mouth daily 30 tablet 11    topiramate  (TOPAMAX ) 100 MG tablet Take 1 tablet by mouth  nightly 60 tablet 5        Medications patient taking as of now reconciled against medications ordered at time of hospital discharge: Yes    A comprehensive review of systems was negative except for what was noted in the HPI.    Objective:       Vitals:    01/22/24 1338   BP: (!) 100/48   BP Site: Left Upper Arm   Patient Position: Sitting   BP Cuff Size: Medium Adult   Pulse: 52   Temp: 97.7 F (36.5  C)   TempSrc: Oral   SpO2: 97%   Weight: 65.7 kg (144 lb 12.8 oz)   Height: 1.575 m (5' 2)      Body mass index is 26.48 kg/m.       Patient in no acute respiratory distress or painful distress.  Normal appearance, not ill looking    Normocephalic, PERRL, EOM movements intact, no nystagmus, bi-lateral external auditory canal and tympanic membranes WNL, normal oro-pharynx.    Mucous membrnes moist and pink. No scleral icterus    Vesicular BS, no wheeze ronchi, crackles or other adventitious sounds    S1S2 regular rate and rhythm, no murmur gallops or rubs    Soft depressible not tender on palpation, no guarding or rebound, no signs of ascites    No signs of edema to legs or feet    Alert and Oriented to time, place and person. No focal signs or neurological deficits. Muscle power grade 5/5 in major muscle groups of all 4 limbs. Cranial nerves III-XII intact and WNL.       An electronic signature was used to authenticate this note.  --Ronelle Michie O. Vannie, MD

## 2024-01-22 NOTE — Assessment & Plan Note (Addendum)
 A1C:   Lab Results   Component Value Date/Time    LABA1C 6.8 01/22/2024 01:51 PM    LABA1C 6.1 02/22/2023 02:38 PM     Increase A1c likely from increased prescribing of steroids for COPD exacerbation  We will prescribe metformin  to help lower A1c.  Will titrate dose accordingly.  Prescribed glucometer and test strips  Patient will return in 1 week for Medicare AWV and we will further discuss his diagnosis  Counselled on Diet and exercise with goal to achieve appropriate BMI  Continue Diabetic medication as documented in medication list  Continue home foot care  Patient agreed with plan with verbal understanding

## 2024-01-22 NOTE — Assessment & Plan Note (Signed)
Stable  Continue anti-hypertensive medication as documented in your medication list amlodipine 10 mg daily  To verify blood pressure cuff accuracy  To keep outpatient BP log,  counseled on exercise and diet (including DASH diet)  Goal to achieve appropriate BMI  Patient agreed with plan with verbal understanding

## 2024-01-22 NOTE — Assessment & Plan Note (Signed)
stable on Protonix 40 mg daily.

## 2024-01-22 NOTE — Assessment & Plan Note (Signed)
 diagnosed in the hospital patient tolerating Reglan .

## 2024-01-22 NOTE — Assessment & Plan Note (Signed)
 patient's symptoms are stable today.  Continue Advair  and as needed inhaler.  Patient will continue follow-up with pulmonologist.

## 2024-01-24 MED ORDER — DEPEND ADJUSTABLE UNDERWEAR MISC
Freq: Three times a day (TID) | 5 refills | Status: AC
Start: 2024-01-24 — End: ?

## 2024-01-24 NOTE — Telephone Encounter (Signed)
Please fax prescription.

## 2024-01-24 NOTE — Telephone Encounter (Signed)
 Prescription faxed

## 2024-01-24 NOTE — Telephone Encounter (Signed)
 Idris's son in-law called and said that [employer] needs the script for his adult diapers fax to [phone].

## 2024-01-28 ENCOUNTER — Encounter: Payer: Medicare (Managed Care) | Attending: Internal Medicine | Primary: Internal Medicine

## 2024-02-01 ENCOUNTER — Inpatient Hospital Stay: Admit: 2024-02-01 | Payer: Medicare (Managed Care) | Attending: Internal Medicine | Primary: Internal Medicine

## 2024-02-01 DIAGNOSIS — Z136 Encounter for screening for cardiovascular disorders: Principal | ICD-10-CM

## 2024-02-01 NOTE — Consults (Signed)
 Session ID: [id number]  Session Duration: 15 minutes  Language: Nepali  Interpreter ID: [phone]  Interpreter Name: Terrall

## 2024-02-01 NOTE — Other (Signed)
 Please let patient know that his AAA screen was negative for aneurysm

## 2024-02-01 NOTE — Consults (Signed)
 Session ID: [id number]  Session Duration: 5 minutes  Language: Nepali  Interpreter ID: #[id number]  Interpreter Name: Sandria

## 2024-02-26 ENCOUNTER — Encounter

## 2024-02-26 ENCOUNTER — Ambulatory Visit
Admit: 2024-02-26 | Discharge: 2024-02-26 | Payer: Medicare (Managed Care) | Attending: Internal Medicine | Primary: Internal Medicine

## 2024-02-26 VITALS — BP 124/72 | HR 67 | Ht 62.0 in | Wt 147.0 lb

## 2024-02-26 DIAGNOSIS — K3184 Gastroparesis: Principal | ICD-10-CM

## 2024-02-26 MED ORDER — METOCLOPRAMIDE HCL 5 MG PO TABS
5 | ORAL_TABLET | Freq: Three times a day (TID) | ORAL | 1 refills | 9.00000 days | Status: AC
Start: 2024-02-26 — End: ?

## 2024-02-26 MED ORDER — ACCU-CHEK GUIDE ME W/DEVICE KIT
PACK | 0 refills | Status: AC
Start: 2024-02-26 — End: ?

## 2024-02-26 MED ORDER — TOPIRAMATE 100 MG PO TABS
100 | ORAL_TABLET | Freq: Every evening | ORAL | 5 refills | 90.00000 days | Status: AC
Start: 2024-02-26 — End: ?

## 2024-02-26 MED ORDER — LEVOTHYROXINE SODIUM 75 MCG PO TABS
75 | ORAL_TABLET | Freq: Every day | ORAL | 11 refills | 90.00000 days | Status: AC
Start: 2024-02-26 — End: ?

## 2024-02-26 MED ORDER — BENZONATATE 200 MG PO CAPS
200 | ORAL_CAPSULE | ORAL | 0 refills | 8.50000 days | Status: AC
Start: 2024-02-26 — End: ?

## 2024-02-26 MED ORDER — ALBUTEROL SULFATE HFA 108 (90 BASE) MCG/ACT IN AERS
108 | Freq: Four times a day (QID) | RESPIRATORY_TRACT | 3 refills | 25.00000 days | Status: AC | PRN
Start: 2024-02-26 — End: ?

## 2024-02-26 MED ORDER — PRAVASTATIN SODIUM 40 MG PO TABS
40 | ORAL_TABLET | Freq: Every evening | ORAL | 3 refills | 90.00000 days | Status: DC
Start: 2024-02-26 — End: 2024-04-21

## 2024-02-26 MED ORDER — FLUTICASONE-SALMETEROL 250-50 MCG/ACT IN AEPB
250-50 | Freq: Two times a day (BID) | RESPIRATORY_TRACT | 3 refills | 30.00000 days | Status: AC
Start: 2024-02-26 — End: ?

## 2024-02-26 NOTE — Progress Notes (Signed)
 Michael Knight    Date of Birth: Dec 13, 1955     Date of Service:  02/26/2024     Chief Complaint   Patient presents with    Follow-Up from Hospital       HPI Nepali interpreter was utilized for this encounter.  Patient has been accompanied by his son to our office today.  Patient was hospitalized between 8/4 and 8/8 for evaluation of asthmatic bronchitis.  CT PE was negative.  In fact he was noted to have severe gastroparesis with delayed gastric emptying on EGD and gastric emptying study.  He was started on Protonix  40 mg twice daily and Reglan  5 mg 3 times daily, to which he had a response-however, he is now off completely since the medications have not been renewed.    States that he still has dyspnea and abdominal bloating, but not quite as bad as during his previous hospitalization.  Denies any nausea or vomiting.    No Known Allergies  Outpatient Medications Marked as Taking for the 02/26/24 encounter (Office Visit) with Lanning Laurance DEL, MD   Medication Sig Dispense Refill    fluticasone -salmeterol (ADVAIR  DISKUS) 250-50 MCG/ACT AEPB diskus inhaler Inhale 1 puff into the lungs in the morning and 1 puff in the evening. 60 each 3    albuterol  sulfate HFA (PROVENTIL ;VENTOLIN ;PROAIR ) 108 (90 Base) MCG/ACT inhaler Inhale 2 puffs into the lungs 4 times daily as needed for Wheezing 18 g 3    Incontinence Supply Disposable (DEPEND ADJUSTABLE UNDERWEAR) MISC 1 each by Does not apply route in the morning, at noon, and at bedtime 90 each 5    benzonatate  (TESSALON ) 100 MG capsule Take 1 capsule by mouth Three times daily as needed      levothyroxine  (SYNTHROID ) 25 MCG tablet Take 1 tablet by mouth Daily      metFORMIN  (GLUCOPHAGE -XR) 750 MG extended release tablet Take 1 tablet by mouth daily (with breakfast) 30 tablet 3    blood glucose monitor strips by Other route daily Test 1 time a day. Alternate between morning fasting blood sugar,  and bedtime (2 hours after eating). 50 strip 5    Blood Glucose Monitoring Suppl  (BLOOD GLUCOSE MONITOR SYSTEM) w/Device KIT Test 1 time a day. Alternate between morning fasting blood sugar,  and bedtime (2 hours after eating). 1 kit 0    Lancets MISC 1 each by Does not apply route daily Test 1 time a day. Alternate between morning fasting blood sugar,  and bedtime (2 hours after eating). 300 each 5    pantoprazole  (PROTONIX ) 40 MG tablet Take 1 tablet by mouth in the morning and at bedtime 60 tablet 0    levothyroxine  (SYNTHROID ) 75 MCG tablet Take 1 tablet by mouth Daily      pravastatin  (PRAVACHOL ) 40 MG tablet Take 1 tablet by mouth nightly      traZODone  (DESYREL ) 50 MG tablet Take 1 tablet by mouth nightly 1 and 1/2 tabs 45 tablet 5    Ubrogepant  (UBRELVY ) 100 MG TABS Take 100 mg by mouth daily as needed (migraine headaches) 16 tablet 5    meclizine  (ANTIVERT ) 12.5 MG tablet TAKE 1 TABLET BY MOUTH 3 TIMES A DAY AS NEEDED FOR DIZZINESS 90 tablet 3    Misc. Devices (CANE) MISC 1 each by Does not apply route daily 1 each 0    propranolol  (INDERAL ) 40 MG tablet TAKE 1 TABLET BY MOUTH EVERY DAY WITH BREAKFAST 30 tablet 11    magnesium  gluconate (MAGONATE) 500  MG tablet TAKE 1 TABLET BY MOUTH TWICE A DAY 60 tablet 5    amLODIPine  (NORVASC ) 10 MG tablet Take 1 tablet by mouth daily 30 tablet 11    topiramate  (TOPAMAX ) 100 MG tablet Take 1 tablet by mouth nightly 60 tablet 5       Immunization History   Administered Date(s) Administered    COVID-19, PFIZER PURPLE top, DILUTE for use, (age 32 y+), 73mcg/0.3mL 08/25/2019, 09/15/2019, 06/09/2020    Influenza, FLUAD, (age 81 y+), IM, Trivalent PF, 0.40mL 04/13/2016    Influenza, FLUBLOK, (age 13 y+), Quadv PF, 0.26mL 05/09/2019    Pneumococcal, PCV20, PREVNAR 20, (age 6w+), IM, 0.5mL 01/22/2024       Past Medical History:   Diagnosis Date    Asthma     Back pain     Dyslipidemia     GERD (gastroesophageal reflux disease)     Hyperlipidemia     Hypertension     Hypothyroidism     TB (tuberculosis)      Past Surgical History:   Procedure Laterality Date     BRONCHOSCOPY N/A 10/28/2020    BRONCHOSCOPY ALVEOLAR LAVAGE performed by Laurance VEAR Leu, MD at [facility] END[facility]    ELBOW SURGERY Right     HAND SURGERY      UPPER GASTROINTESTINAL ENDOSCOPY N/A 02/08/2018    EGD BIOPSY performed by Garnette SHAUNNA Lunger, MD at [facility] ENDOS[facility]    UPPER GASTROINTESTINAL ENDOSCOPY N/A 01/16/2024    ESOPHAGOGASTRODUODENOSCOPY DIAGNOSTIC ONLY performed by Toniann Ranks, MD at [facility] ENDOSCOPY     No family history on file.    Review of Systems:  Review of Systems   Constitutional:  Positive for fatigue. Negative for activity change, appetite change, fever and unexpected weight change.   HENT:  Negative for congestion, ear discharge, ear pain, postnasal drip, rhinorrhea, sinus pressure, sneezing, sore throat, tinnitus and voice change.    Respiratory:  Positive for cough and shortness of breath. Negative for apnea, choking, chest tightness, wheezing and stridor.    Cardiovascular:  Negative for chest pain, palpitations and leg swelling.   Gastrointestinal:  Positive for abdominal distention. Negative for abdominal pain, blood in stool, constipation, diarrhea and vomiting.   Musculoskeletal:  Negative for arthralgias, back pain and gait problem.   Skin:  Negative for color change, pallor and rash.   Allergic/Immunologic: Negative for environmental allergies.   Neurological:  Negative for dizziness, tremors, seizures, syncope, speech difficulty, weakness, light-headedness, numbness and headaches.   Hematological:  Negative for adenopathy. Does not bruise/bleed easily.   Psychiatric/Behavioral:  Negative for sleep disturbance.        Vitals:    02/26/24 1353   BP: 124/72   BP Site: Left Upper Arm   Patient Position: Sitting   BP Cuff Size: Small Adult   Pulse: 67   SpO2: 94%   Weight: 66.7 kg (147 lb)   Height: 1.575 m (5' 2)          No data to display               Body mass index is 26.89 kg/m.     Wt Readings from Last 3 Encounters:   02/26/24 66.7 kg (147 lb)   01/22/24  65.7 kg (144 lb 12.8 oz)   01/18/24 67.1 kg (147 lb 14.4 oz)     BP Readings from Last 3 Encounters:   02/26/24 124/72   01/22/24 (!) 100/48   01/18/24 122/74  Physical Exam    Health Maintenance   Topic Date Due    Diabetic foot exam  Never done    Diabetic Alb to Cr ratio (uACR) test  Never done    Diabetic retinal exam  Never done    DTaP/Tdap/Td vaccine (1 - Tdap) Never done    Shingles vaccine (1 of 2) Never done    Respiratory Syncytial Virus (RSV) Pregnant or age 56 yrs+ (1 - Risk 60-74 years 1-dose series) Never done    Annual Wellness Visit (Medicare Advantage)  06/13/2023    Flu vaccine (1) 01/11/2024    COVID-19 Vaccine (4 - 2025-26 season) 02/11/2024    Lipids  02/29/2024    Colorectal Cancer Screen  04/01/2024    Depression Screen  09/24/2024    GFR test (Diabetes, CKD 3-4, OR last GFR 15-59)  01/13/2025    A1C test (Diabetic or Prediabetic)  01/21/2025    Pneumococcal 50+ years Vaccine  Completed    AAA screen  Completed    Hepatitis C screen  Completed    Hepatitis A vaccine  Aged Out    Hepatitis B vaccine  Aged Out    Hib vaccine  Aged Out    Polio vaccine  Aged Out    Meningococcal (ACWY) vaccine  Aged Out    Meningococcal B vaccine  Aged Out    Pneumococcal 0-49 years Vaccine  Discontinued    Diabetes screen  Discontinued    HIV screen  Discontinued    Prostate Specific Antigen (PSA) Screening or Monitoring  Discontinued        Assessment/Plan:    Patient's shortness of breath is mainly related to gastric distention/abdominal bloating as noted on EGD and gastric emptying study.  He will need to restart PPI and Reglan -I will reach out to his PCP.  EGD performed on 8/6 could not be fully completed since patient had significant food retention in the stomach and it was felt unsafe to continue the procedure.  Patient might benefit from repeat EGD evaluation-Will refer to Dr. Toniann from GI.    Asthmatic bronchitis, doubt true exacerbation.  Doubtful that patient truly has asthma, given findings  above.  Patient states that Advair  250 does provide him with some benefit and hence wants to continue.  Prior PFT from 2022 was normal overall with no evidence of obstruction.    Return in about 3 months (around 05/27/2024).

## 2024-02-26 NOTE — Telephone Encounter (Signed)
 Recent Visits  Date Type Provider Dept   01/22/24 Office Visit Vannie Cara KIDD, MD [facility]   12/25/23 Office Visit Vannie Cara KIDD, MD [facility]   09/25/23 Office Visit Vannie Cara KIDD, MD [facility]   07/10/23 Office Visit Vannie Cara KIDD, MD [facility]   03/01/23 Office Visit Vannie Cara KIDD, MD [facility]   02/22/23 Office Visit Vannie Cara KIDD, MD [facility]   Showing recent visits within past 540 days with a meds authorizing provider and meeting all other requirements  Future Appointments  Date Type Provider Dept   03/03/24 Appointment Vannie Cara KIDD, MD [facility]   Showing future appointments within next 150 days with a meds authorizing provider and meeting all other requirements     01/22/2024

## 2024-02-27 NOTE — Telephone Encounter (Signed)
 LVM for patient letting them know RX was sent over per Dr. Vannie.

## 2024-02-27 NOTE — Telephone Encounter (Signed)
-----   Message from Dr. Cara Finder, MD sent at 02/26/2024  6:43 PM EDT -----  Regarding: Metoclopramide   Please call patient let him know that I have sent in a prescription for his metoclopramide /Reglan

## 2024-03-03 ENCOUNTER — Ambulatory Visit
Admit: 2024-03-03 | Discharge: 2024-03-03 | Payer: Medicare (Managed Care) | Attending: Internal Medicine | Primary: Internal Medicine

## 2024-03-03 MED ORDER — CELECOXIB 200 MG PO CAPS
200 | ORAL_CAPSULE | Freq: Two times a day (BID) | ORAL | 0 refills | 30.00000 days | Status: DC
Start: 2024-03-03 — End: 2024-04-07

## 2024-03-03 NOTE — Assessment & Plan Note (Signed)
 We will get x-ray of right ankle and prescribe celecoxib 

## 2024-03-03 NOTE — Assessment & Plan Note (Signed)
 diagnosed in the hospital patient tolerating Reglan . Thought to be causing acid reflux and worsening his cough   Is scheduled to see GI

## 2024-03-03 NOTE — Progress Notes (Signed)
 Medicare Annual Wellness Visit    Michael Knight is here for Medicare AWV    Assessment & Plan   Medicare annual wellness visit, subsequent  Assessment & Plan:   Patient comes in for Medicare AWV.   we discussed age appropriate USPSTF screens  Over 75% of the visit was spent counselling patient on appropriate lifestyle choices.      Acute right ankle pain  Assessment & Plan:  We will get x-ray of right ankle and prescribe celecoxib   Orders:  -     XR ANKLE RIGHT (MIN 3 VIEWS); Future  -     celecoxib  (CELEBREX ) 200 MG capsule; Take 1 capsule by mouth 2 times daily, Disp-60 capsule, R-0Normal  Essential hypertension  Assessment & Plan:   Stable  Continue anti-hypertensive medication as documented in your medication list amlodipine  10 mg daily  To verify blood pressure cuff accuracy  To keep outpatient BP log,  counseled on exercise and diet (including DASH diet)  Goal to achieve appropriate BMI  Patient agreed with plan with verbal understanding   Type 2 diabetes mellitus without complication, without long-term current use of insulin (HCC)  Assessment & Plan:   A1C:   Lab Results   Component Value Date/Time    LABA1C 6.8 01/22/2024 01:51 PM    LABA1C 6.1 02/22/2023 02:38 PM     Increase A1c likely from increased prescribing of steroids for COPD exacerbation  We will prescribe metformin  to help lower A1c.  Will titrate dose accordingly.  Prescribed glucometer and test strips  Patient will return in 1 week for Medicare AWV and we will further discuss his diagnosis  Counselled on Diet and exercise with goal to achieve appropriate BMI  Continue Diabetic medication as documented in medication list  Continue home foot care  Patient agreed with plan with verbal understanding      Gastroparesis  Assessment & Plan:    diagnosed in the hospital patient tolerating Reglan . Thought to be causing acid reflux and worsening his cough   Is scheduled to see GI  Acquired hypothyroidism  Assessment & Plan:    stable on levothyroxine  75 mcg        Gastroesophageal reflux disease without esophagitis  Assessment & Plan:    stable on Protonix  40 mg daily  Needs flu shot  -     Influenza, FLUAD Trivalent, (age 67 y+), IM, Preservative Free, 0.5mL       Well adult exam  -  Anticipatory Guidance  Injury Prevention  Lap-shoulder belts, Smoke detectors, Carbon monoxide detectors, and  Occupational risk counseling  Substance Abuse  - Tobacco/alcohol/drug cessation or never starting any of those. Include pharmacotherapy, social support for cessation if applicable to patient, and skills training/problem solving. Availability of treatment for abuse.    Sexual Behavior  - STD prevention; abstinence; avoid high-risk behavior; condoms/male barrier with spermicide,  Contraception   Diet and Exercise   - Limit fat and cholesterol; maintain caloric balance; emphasized grains, fruits and vegetables. Moderation in sodium/caffeine  intake, saturated fat and cholesterol, caloric balance, sufficient intake of fresh fruits, vegetables, fiber, calcium. Regular physical activity at least 150 minutes per week to maintain activity. Stressed the importance of regular exercise.    Protection from UV Light: Discussed using Hats and sun block when exposed to direct sunlight. To schedule general skin exam with Dermatology.  Abuse and Violence: violence prevention at home, school and in social situations  Dental Health: Discussed importance of regular tooth brushing, flossing, and dental  visits.  Immunizations reviewed : Discussed with patient   Return in about 2 months (around 05/14/2024) for Routine follow-up, 15-minutes .     Subjective     Medicare AWV - Patient presents today for Medicare AWV. Patient  reports feeling well. Patient has a normal appetite. Patient  eats 5+ servings of vegetables/fruits each day. Patient prepares food at home multiple times/day, eats in restaurants rarely. Patient  states that s he sleeps well and gets 7-8 hours of sleep on average. In regards to  emotional health, patient  denies depression or anxiety. Libido is considered to be normal. In regards to bowel habits, patient  has no complaints. Regarding urination, no complaints. Patient reports they feels safe at home, uses seat belts and has smoke detectors.     Patient denies issues with pain, fatigue, loneliness, social isolation, stress, anger, inadequate social support.  Patient has a living will an advanced directives      Sexual History : Patient declined to answer.    Other Issues discussed    Right ankle pain  -patient complaining of pain to right ankle for 5 to 6 days.  Patient said he did roll his foot.  Patient complaining of pain to the inner part of his ankle which is worse with weightbearing. Patient will like to know what the next step is and treatment options are.     Health Maintenance    Annual retinal eye exam - 8/24  Annual Dentist visit - 2023  Tobacco smoking  - Quit 2012  Alcohol Misuse - NO  Illicit Drug Use- NO  Healthy Diet and physical activity - YES  Obesity Screen- screened  Wears seat belt- YES  End of life directives discussed with patient.-Mentions he does not have  a will/or/an advanced directives. Was instructed to start process looking into preparing his will an advanced directives.    Patient's complete Health Risk Assessment and screening values have been reviewed and are found in Flowsheets. The following problems were reviewed today and where indicated follow up appointments were made and/or referrals ordered.    Positive Risk Factor Screenings with Interventions:    Fall Risk:  Do you feel unsteady or are you worried about falling? : (!) yes  2 or more falls in past year?: no  Fall with injury in past year?: noInterventions:    Reviewed medications, home hazards, visual acuity, and co-morbidities that can increase risk for falls    Cognitive:   Clock Drawing Test (CDT): (!) Abnormal  Words recalled: 0 Words Recalled  Total Score: (!) 0  Total Score Interpretation:  Abnormal Mini-Cog  Interventions:  Patient advised to follow-up in this office for further evaluation and treatment          Self-assessment of health:  In general, how would you say your health is?: (!) Poor  Interventions:  Patient advised to follow-up in this office for further evaluation and treatment     Inactivity:  On average, how many days per week do you engage in moderate to strenuous exercise (like a brisk walk)?: 1 day (!) Abnormal  On average, how many minutes do you engage in exercise at this level?: 10 min  Interventions:  Ankle pain limiting activity      Dentist Screen:  Have you seen the dentist within the past year?: (!) No  Intervention:  Advised to schedule with their dentist        ADL's:   Patient reports needing help with:  Select all  that apply: (!) Eating, Dressing, Grooming, Bathing, Toileting, Walking/Balance  Select all that apply: [employer], Housekeeping, Banking/Finances, Shopping, Telephone Use, Presenter, broadcasting, Transportation, Taking Medications        Advanced Directives:  Do you have a Living Will?: (!) No  Intervention:  End of life directives discussed with patient.-Mentions he does not have  a will/or/an advanced directives. Was instructed to start process looking into preparing his will an advanced directives.          Objective   Vitals:    03/03/24 1108   BP: 120/68   BP Site: Left Upper Arm   Patient Position: Sitting   BP Cuff Size: Medium Adult   Pulse: 52   Temp: 97.6 F (36.4 C)   TempSrc: Oral   SpO2: 97%   Weight: 66.9 kg (147 lb 8 oz)   Height: 1.539 m (5' 0.6)      Body mass index is 28.24 kg/m.          Patient in no acute respiratory distress or painful distress.  Normal appearance, not ill looking    Normocephalic, PERRL, EOM movements intact, no nystagmus, bi-lateral external auditory canal and tympanic membranes WNL, normal oro-pharynx.    Mucous membrnes moist and pink. No scleral icterus    Vesicular BS, no wheeze ronchi, crackles or other adventitious  sounds    S1S2 regular rate and rhythm, no murmur gallops or rubs    Soft depressible not tender on palpation, no guarding or rebound, no signs of ascites    No signs of edema to legs or feet    Alert and Oriented to time, place and person. No focal signs or neurological deficits. Muscle power grade 5/5 in major muscle groups of all 4 limbs. Cranial nerves III-XII intact and WNL.             No Known Allergies  Prior to Visit Medications   Medication Sig Taking? Authorizing Provider   celecoxib  (CELEBREX ) 200 MG capsule Take 1 capsule by mouth 2 times daily Yes Cayenne Breault O, MD   benzonatate  (TESSALON ) 200 MG capsule TAKE 1 CAPSULE BY MOUTH 3 TIMES A DAY AS NEEDED FOR COUGH Yes Korver Graybeal O, MD   Blood Glucose Monitoring Suppl (ACCU-CHEK GUIDE ME) w/Device KIT USE TO TEST BLOOD GLUCOSE ONCE DAILY. ALTERNATE BETWEEN MORNING FASTING BLOOD SUGAR AND BEDTIME (2 HOURS AFTER EATING). Yes Zariah Cavendish, Cara KIDD, MD   pravastatin  (PRAVACHOL ) 40 MG tablet TAKE 1 TABLET BY MOUTH AT NIGHT Yes Lou Irigoyen O, MD   topiramate  (TOPAMAX ) 100 MG tablet Take 1 tablet by mouth nightly Yes Sharniece Gibbon, Cara KIDD, MD   levothyroxine  (SYNTHROID ) 75 MCG tablet Take 1 tablet by mouth Daily Yes Mystic Labo O, MD   fluticasone -salmeterol (ADVAIR  DISKUS) 250-50 MCG/ACT AEPB diskus inhaler Inhale 1 puff into the lungs in the morning and 1 puff in the evening. Yes Lakshminarayana, Laurance DEL, MD   albuterol  sulfate HFA (PROVENTIL ;VENTOLIN ;PROAIR ) 108 (90 Base) MCG/ACT inhaler Inhale 2 puffs into the lungs 4 times daily as needed for Wheezing Yes Lakshminarayana, Pradeep H, MD   metoclopramide  (REGLAN ) 5 MG tablet Take 1 tablet by mouth 3 times daily Yes Mayanna Garlitz, Cara KIDD, MD   Incontinence Supply Disposable (DEPEND ADJUSTABLE UNDERWEAR) MISC 1 each by Does not apply route in the morning, at noon, and at bedtime Yes Keren Alverio O, MD   benzonatate  (TESSALON ) 100 MG capsule Take 1 capsule by mouth Three times daily as needed Yes [provider]  metFORMIN  (GLUCOPHAGE -XR) 750 MG extended release tablet Take 1 tablet by mouth daily (with breakfast) Yes Rumaysa Sabatino O, MD   blood glucose monitor strips by Other route daily Test 1 time a day. Alternate between morning fasting blood sugar,  and bedtime (2 hours after eating). Yes Vannie Cara KIDD, MD   Lancets MISC 1 each by Does not apply route daily Test 1 time a day. Alternate between morning fasting blood sugar,  and bedtime (2 hours after eating). Yes Vannie Cara KIDD, MD   pantoprazole  (PROTONIX ) 40 MG tablet Take 1 tablet by mouth in the morning and at bedtime Yes Tobie Naas, MD   tamsulosin  (FLOMAX ) 0.4 MG capsule Take 1 capsule by mouth nightly Yes [provider]   traZODone  (DESYREL ) 50 MG tablet Take 1 tablet by mouth nightly 1 and 1/2 tabs Yes Tymara Saur O, MD   Ubrogepant  (UBRELVY ) 100 MG TABS Take 100 mg by mouth daily as needed (migraine headaches) Yes Shed Nixon O, MD   meclizine  (ANTIVERT ) 12.5 MG tablet TAKE 1 TABLET BY MOUTH 3 TIMES A DAY AS NEEDED FOR DIZZINESS Yes Vannie Cara KIDD, MD   Misc. Devices (CANE) MISC 1 each by Does not apply route daily Yes Tamyah Cutbirth O, MD   propranolol  (INDERAL ) 40 MG tablet TAKE 1 TABLET BY MOUTH EVERY DAY WITH BREAKFAST Yes Tyquon Near, Cara KIDD, MD   magnesium  gluconate (MAGONATE) 500 MG tablet TAKE 1 TABLET BY MOUTH TWICE A DAY Yes Aevah Stansbery O, MD   amLODIPine  (NORVASC ) 10 MG tablet Take 1 tablet by mouth daily Yes Priyanka Causey, Cara KIDD, MD   levothyroxine  (SYNTHROID ) 25 MCG tablet Take 1 tablet by mouth Daily  [provider]   hydroCHLOROthiazide (MICROZIDE) 12.5 MG capsule Take 1 capsule by mouth daily  [provider]       CareTeam (Including outside providers/suppliers regularly involved in providing care):   Patient Care Team:  Vannie Cara KIDD, MD as PCP - General (Internal Medicine)  Vannie Cara KIDD, MD as PCP - Empaneled Provider     Recommendations for Preventive Services Due: see orders  and patient instructions/AVS.  Recommended screening schedule for the next 5-10 years is provided to the patient in written form: see Patient Instructions/AVS.     Reviewed and updated this visit:  Tobacco  Allergies  Meds

## 2024-03-03 NOTE — Assessment & Plan Note (Signed)
 Patient comes in for Medicare AWV.   we discussed age appropriate USPSTF screens  Over 75% of the visit was spent counselling patient on appropriate lifestyle choices.

## 2024-03-03 NOTE — Consults (Signed)
 Session ID: [id number]  Session Duration: 29 minutes  Language: Nepali  Interpreter ID: #[id number]  Interpreter Name: [place]: Nepali  Interpreter ID: [phone]  Interpreter Name: Colene

## 2024-03-03 NOTE — Assessment & Plan Note (Addendum)
 A1C:   Lab Results   Component Value Date/Time    LABA1C 6.8 01/22/2024 01:51 PM    LABA1C 6.1 02/22/2023 02:38 PM     Increase A1c likely from increased prescribing of steroids for COPD exacerbation  We will prescribe metformin  to help lower A1c.  Will titrate dose accordingly.  Prescribed glucometer and test strips  Patient will return in 1 week for Medicare AWV and we will further discuss his diagnosis  Counselled on Diet and exercise with goal to achieve appropriate BMI  Continue Diabetic medication as documented in medication list  Continue home foot care  Patient agreed with plan with verbal understanding

## 2024-03-03 NOTE — Assessment & Plan Note (Signed)
Stable  Continue anti-hypertensive medication as documented in your medication list amlodipine 10 mg daily  To verify blood pressure cuff accuracy  To keep outpatient BP log,  counseled on exercise and diet (including DASH diet)  Goal to achieve appropriate BMI  Patient agreed with plan with verbal understanding

## 2024-03-03 NOTE — Patient Instructions (Signed)
 Preventing Falls: Care Instructions  Injuries and health problems such as trouble walking or poor eyesight can increase your risk of falling. So can some medicines. But there are things you can do to help prevent falls. You can exercise to get stronger. You can also arrange your home to make it safer.    Talk to your doctor about the medicines you take. Ask if any of them increase the risk of falls and whether they can be changed or stopped.   Try to exercise regularly. It can help improve your strength and balance. This can help lower your risk of falling.         Practice fall safety and prevention.   Wear low-heeled shoes that fit well and give your feet good support. Talk to your doctor if you have foot problems that make this hard.  Carry a cellphone or wear a medical alert device that you can use to call for help.  Use stepladders instead of chairs to reach high objects. Don't climb if you're at risk for falls. Ask for help, if needed.  Wear the correct eyeglasses, if you need them.        Make your home safer.   Remove rugs, cords, clutter, and furniture from walkways.  Keep your house well lit. Use night-lights in hallways and bathrooms.  Install and use sturdy handrails on stairways.  Wear nonskid footwear, even inside. Don't walk barefoot or in socks without shoes.        Be safe outside.   Use handrails, curb cuts, and ramps whenever possible.  Keep your hands free by using a shoulder bag or backpack.  Try to walk in well-lit areas. Watch out for uneven ground, changes in pavement, and debris.  Be careful in the winter. Walk on the grass or gravel when sidewalks are slippery. Use de-icer on steps and walkways. Add non-slip devices to shoes.    Put grab bars and nonskid mats in your shower or tub and near the toilet. Try to use a shower chair or bath bench when bathing.   Get into a tub or shower by putting in your weaker leg first. Get out with your strong side first. Have a phone or medical alert  device in the bathroom with you.   Where can you learn more?  Go to [employer] and enter G117 to learn more about Preventing Falls: Care Instructions.  Current as of: January 10, 2023  Content Version: 14.6   2024-2025 [place], [place].   Care instructions adapted under license by [facility]. If you have questions about a medical condition or this instruction, always ask your healthcare professional. Romayne Alderman, [facility], disclaims any warranty or liability for your use of this information.         Learning About Mild Cognitive Impairment (MCI)  What is mild cognitive impairment (MCI)?     It's common to forget things sometimes as we get older. But some older people have memory loss that's more than normal aging. It's called mild cognitive impairment, or MCI. It is not the same as dementia.  People with the condition often know that their memory or mental function has changed. Tests may show some loss. But their minds work well overall. They can carry out daily tasks that are normal for them.  People with MCI have a higher chance of one day getting dementia. But not all people who have it will get dementia. Some people may stay the same over time.  What  are the symptoms?  People with MCI have more memory loss than what occurs with normal aging. They may have increasing trouble with recalling words and keeping up with conversations. They may also have trouble remembering important events and making decisions.  What puts you at risk?  The risk of getting MCI increases with age. Having high blood pressure or having a family history of MCI may also increase your risk.  How is it diagnosed?  Your doctor will do a physical exam.  You may be asked questions to check your memory and other mental skills. Your doctor may also talk to close friends and family members. This can help the doctor figure out how your memory and other mental skills have changed.  You may get blood tests and tests  that look at your brain.  These questions and tests can make sure you don't have other conditions that can cause symptoms like MCI. These include depression, sleep problems, and side effects from medicines.  How is it treated?  There are no medicines to treat MCI or to keep it from progressing to dementia. But treating conditions like high blood pressure and diabetes may help. A person with MCI needs routine follow-up visits with their doctor to check on changes in the person's mental skills.  How can you care for yourself at home?  Keeping your body active can help slow MCI. Exercises like walking can help. Try to stay active mentally too. Read or do things like crossword puzzles if you enjoy doing them.  If you need help coping with MCI, you may want to get support from family, friends, a support group, or a counselor who works with people who have MCI.  Though the future isn't always clear, it can be good to plan ahead with instructions for your care. These are called advanced directives. Having a plan can help make sure that you get the care you want.  Current as of: May 15, 2023  Content Version: 14.6   2024-2025 [place], [place].   Care instructions adapted under license by [facility]. If you have questions about a medical condition or this instruction, always ask your healthcare professional. Romayne Alderman, [facility], disclaims any warranty or liability for your use of this information.         Learning About Emotional Support  When do you need emotional support?     You might find getting support from others helpful when you have a long-term health problem. Often people feel alone, confused, or scared when coping with an illness. But you aren't alone. Other people are going through the same thing you are and know how you feel.  Talking with others about your feelings can help you feel better.  Your family and friends can give you support. So can your doctor, a support group, or a church. If you  have a support network, you will not feel as alone. You will learn new ways to deal with your situation, and you may try harder to overcome it.  Where you can get support  Family and friends: They can help you cope by giving you comfort and encouragement.  Counseling: Professional counseling can help you cope with situations that interfere with your life and cause stress. Counseling can help you understand and deal with your illness.  Your doctor: Find a doctor you trust and feel comfortable with. Be open and honest about your fears and concerns. Your doctor can help you get the right medical treatments, including counseling.  Spiritual or religious groups: They can provide comfort and may be able to help you find counseling or other social support services.  Social groups: They can help you meet new people and get involved in activities you enjoy.  Community support groups: In a support group, you can talk to others who have dealt with the same problems or illness as you. You can encourage one another and learn ways to cope with tough emotions.  How can you find a support group?  Finding a support group that works for you may take time. There are many options. Some groups have a group leader who helps lead discussions or shares information. Others are less formal. Some meet in person, while others meet online.  Try using these resources to help you find the best support group for you.  Your doctor, health care team, or counselor.  People with the same health concern.  Your local church, mosque, synagogue, or other religious group.  A city, state, or national group that provides support for your health concern. Check your local library or community center for a list of these groups. Or look for information online.  Your local community, friends, and family.  Supportive relationships  A supportive relationship includes emotional support such as love, trust, and understanding, as well as advice and concrete help, such  as help managing your time.  Reach out to others  Family and friends can help you. Ask them to:  Listen to you and give you encouragement. This can keep you from feeling hopeless or alone.  Help with small daily tasks or with bigger problems. A helping hand can keep you from feeling overwhelmed.  Help you manage a health problem. For example, ask them to go to doctor visits with you. Your loved ones can offer support by being involved in your medical care.  Respect your relationships  A good relationship is also a two-way street. You count on help from others, but they also count on you.  Know your friends' limits. You don't have to see or call your friends every day. If you are going through a rough patch, ask friends if you can contact them outside of the usual boundaries.  Don't always complain or talk about yourself. Know when it's time to stop talking and listen or just enjoy your friend's company.  Know that good friends can be a bad influence. For example, if a friend encourages you to drink when you know it will harm you, you may want to end the friendship.  Where can you learn more?  Go to [employer] and enter G092 to learn more about Learning About Emotional Support.  Current as of: January 10, 2023  Content Version: 14.6   2024-2025 [place], [place].   Care instructions adapted under license by [facility]. If you have questions about a medical condition or this instruction, always ask your healthcare professional. Romayne Alderman, [facility], disclaims any warranty or liability for your use of this information.         Learning About Being Active as an Older Adult  Why is being active important as you get older?     Being active is one of the best things you can do for your health. And it's never too late to start. Being active--or getting active, if you aren't already--has definite benefits. It can:  Give you more energy,  Keep your mind sharp.  Improve balance to reduce your  risk of falls.  Help you manage chronic illness  with fewer medicines.  No matter how old you are, how fit you are, or what health problems you have, there is a form of activity that will work for you. And the more physical activity you can do, the better your overall health will be.  What kinds of activity can help you stay healthy?  Being more active will make your daily activities easier. Physical activity includes planned exercise and things you do in daily life. There are four types of activity:  Aerobic.  Doing aerobic activity makes your heart and lungs strong.  Includes walking, dancing, and gardening.  Aim for at least 2 hours spread throughout the week.  It improves your energy and can help you sleep better.  Muscle-strengthening.  This type of activity can help maintain muscle and strengthen bones.  Includes climbing stairs, using resistance bands, and lifting or carrying heavy loads.  Aim for at least twice a week.  It can help protect the knees and other joints.  Stretching.  Stretching gives you better range of motion in joints and muscles.  Includes upper arm stretches, calf stretches, and gentle yoga.  Aim for at least twice a week, preferably after your muscles are warmed up from other activities.  It can help you function better in daily life.  Balancing.  This helps you stay coordinated and have good posture.  Includes heel-to-toe walking, tai chi, and certain types of yoga.  Aim for at least 3 days a week.  It can reduce your risk of falling.  Even if you have a hard time meeting the recommendations, it's better to be more active than less active. All activity done in each category counts toward your weekly total. You'd be surprised how daily things like carrying groceries, keeping up with grandchildren, and taking the stairs can add up.  What keeps you from being active?  If you've had a hard time being more active, you're not alone. Maybe you remember being able to do more. Or maybe you've  never thought of yourself as being active. It's frustrating when you can't do the things you want. Being more active can help. What's holding you back?  Getting started.  Have a goal, but break it into easy tasks. Small steps build into big accomplishments.  Staying motivated.  If you feel like skipping your activity, remember your goal. Maybe you want to move better and stay independent. Every activity gets you one step closer.  Not feeling your best.  Start with 5 minutes of an activity you enjoy. Prove to yourself you can do it. As you get comfortable, increase your time.  You may not be where you want to be. But you're in the process of getting there. Everyone starts somewhere.  How can you find safe ways to stay active?  Talk with your doctor about any physical challenges you're facing. Make a plan with your doctor if you have a health problem or aren't sure how to get started with activity.  If you're already active, ask your doctor if there is anything you should change to stay safe as your body and health change.  If you tend to feel dizzy after you take medicine, avoid activity at that time. Try being active before you take your medicine. This will reduce your risk of falls.  If you plan to be active at home, make sure to clear your space before you get started. Remove things like TV cords, coffee tables, and throw rugs. It's safest to have plenty  of space to move freely.  The key to getting more active is to take it slow and steady. Try to improve only a little bit at a time. Pick just one area to improve on at first. And if an activity hurts, stop and talk to your doctor.  Where can you learn more?  Go to [employer] and enter P600 to learn more about Learning About Being Active as an Older Adult.  Current as of: January 10, 2023  Content Version: 14.6   2024-2025 [place], [place].   Care instructions adapted under license by [facility]. If you have questions about a  medical condition or this instruction, always ask your healthcare professional. Romayne Alderman, [facility], disclaims any warranty or liability for your use of this information.         Learning About Dental Care for Older Adults  Dental care for older adults: Overview  Dental care for older people is much the same as for younger adults. But older adults do have concerns that younger adults do not. Older adults may have problems with gum disease and decay on the roots of their teeth. They may need missing teeth replaced or broken fillings fixed. Or they may have dentures that need to be cared for. Some older adults may have trouble holding a toothbrush.  You can help remind the person you are caring for to brush and floss their teeth or to clean their dentures. In some cases, you may need to do the brushing and other dental care tasks. People who have trouble using their hands or who have dementia may need this extra help.  How can you help with dental care?  Normal dental care  To keep the teeth and gums healthy:  Brush the teeth with fluoride toothpaste twice a day--in the morning and at night--and floss at least once a day. Plaque can quickly build up on the teeth of older adults.  Watch for the signs of gum disease. These signs include gums that bleed after brushing or after eating hard foods, such as apples.  See a dentist regularly. Many experts recommend checkups every 6 months.  Keep the dentist up to date on any new medications the person is taking.  Encourage a balanced diet that includes whole grains, vegetables, and fruits, and that is low in saturated fat and sodium.  Encourage the person you're caring for not to use tobacco products. They can affect dental and general health.  Many older adults have a fixed income and feel that they can't afford dental care. But most towns and cities have programs in which dentists help older adults by lowering fees. Contact your area's public health offices or social  services for information about dental care in your area.  Using a toothbrush  Older adults with arthritis sometimes have trouble brushing their teeth because they can't easily hold the toothbrush. Their hands and fingers may be stiff, painful, or weak. If this is the case, you can:  Offer an Mining engineer toothbrush.  Enlarge the handle of a non-electric toothbrush by wrapping a sponge, an elastic bandage, or adhesive tape around it.  Push the toothbrush handle through a ball made of rubber or soft foam.  Make the handle longer and thicker by taping Popsicle sticks or tongue depressors to it.  You may also be able to buy special toothbrushes, toothpaste dispensers, and floss holders.  Your doctor may recommend a soft-bristle toothbrush if the person you care for bleeds easily. Bleeding can happen because  of a health problem or from certain medicines.  A toothpaste for sensitive teeth may help if the person you care for has sensitive teeth.  How do you brush and floss someone's teeth?  If the person you are caring for has a hard time cleaning their teeth on their own, you may need to brush and floss their teeth for them. It may be easiest to have the person sit and face away from you, and to sit or stand behind them. That way you can steady their head against your arm as you reach around to floss and brush their teeth. Choose a place that has good lighting and is comfortable for both of you.  Before you begin, gather your supplies. You will need gloves, floss, a toothbrush, and a container to hold water  if you are not near a sink. Wash and dry your hands well and put on gloves. Start by flossing:  Gently work a piece of floss between each of the teeth toward the gums. A plastic flossing tool may make this easier, and they are available at most drugstores.  Curve the floss around each tooth into a U-shape and gently slide it under the gum line.  Move the floss firmly up and down several times to scrape off the  plaque.  After you've finished flossing, throw away the used floss and begin brushing:  Wet the brush and apply toothpaste.  Place the brush at a 45-degree angle where the teeth meet the gums. Press firmly, and move the brush in small circles over the surface of the teeth.  Be careful not to brush too hard. Vigorous brushing can make the gums pull away from the teeth and can scratch the tooth enamel.  Brush all surfaces of the teeth, on the tongue side and on the cheek side. Pay special attention to the front teeth and all surfaces of the back teeth.  Brush chewing surfaces with short back-and-forth strokes.  After you've finished, help the person rinse the remaining toothpaste from their mouth.  Where can you learn more?  Go to [employer] and enter F944 to learn more about Learning About Dental Care for Older Adults.  Current as of: January 10, 2023  Content Version: 14.6   2024-2025 [place], [place].   Care instructions adapted under license by [facility]. If you have questions about a medical condition or this instruction, always ask your healthcare professional. Romayne Alderman, [facility], disclaims any warranty or liability for your use of this information.         Learning About Activities of Daily Living  What are activities of daily living?     Activities of daily living (ADLs) are the basic self-care tasks you do every day. These include eating, bathing, dressing, and moving around.  As you age, and if you have health problems, you may find that it's harder to do some of these tasks. If so, your doctor can suggest ideas that may help.  To measure what kind of help you may need, your doctor will ask how well you are able to do ADLs. Let your doctor know if there are any tasks that you are having trouble doing. This is an important first step to getting help. And when you have the help you need, you can stay as independent as possible.  How will a doctor assess your ADLs?  Asking  about ADLs is part of a routine health checkup your doctor will likely do as you age. Your health check  might be done in a doctor's office, in your home, or at a hospital. The goal is to find out if you are having any problems that could make it hard to care for yourself or that make it unsafe for you to be on your own.  To measure your ADLs, your doctor will ask how hard it is for you to do routine tasks. Your doctor may also want to know if you have changed the way you do a task because of a health problem. Your doctor may watch how you:  Walk back and forth.  Keep your balance while you stand or walk.  Move from sitting to standing or from a bed to a chair.  Button or unbutton a [employer] fast streamer.  Remove and put on your shoes.  It's common to feel a little worried or anxious if you find you can't do all the things you used to be able to do. Talking with your doctor about ADLs is a way to make sure you're as safe as possible and able to care for yourself as well as you can. You may want to bring a caregiver, friend, or family member to your checkup. They can help you talk to your doctor.  Follow-up care is a key part of your treatment and safety. Be sure to make and go to all appointments, and call your doctor if you are having problems. It's also a good idea to know your test results and keep a list of the medicines you take.  Current as of: April 05, 2023  Content Version: 14.6   2024-2025 [place], [place].   Care instructions adapted under license by [facility] If you have questions about a medical condition or this instruction, always ask your healthcare professional. Romayne Alderman, [facility], disclaims any warranty or liability for your use of this information.         Advance Directives: Care Instructions  Overview  An advance directive is a legal way to state your wishes at the end of your life. It tells your loved ones and doctor what to do if you can't say what you want.  There are two main  types of advance directives. You can change them any time your wishes change.  Living will. This form tells your loved ones and doctor your wishes about life support and other treatment. The form is also called a declaration.  Medical power of attorney. This form lets you name a person to make treatment decisions for you when you can't speak for yourself. This person is called a health care agent (health care proxy, health care surrogate). The form is also called a durable power of attorney for health care.  If you do not have an advance directive, decisions about your medical care may be made by a family member or doctor who doesn't know you or by a judge.  It may help to think of an advance directive as a gift to the people who care for you. If you have one, they won't have to make tough decisions by themselves.  For more information, including forms for your state, see the [employer] website ([employer]cy).  Follow-up care is a key part of your treatment and safety. Be sure to make and go to all appointments, and call your doctor if you are having problems. It's also a good idea to know your test results and keep a list of the medicines you take.  What should you include in an advance directive?  Many states have a unique advance directive form. (It may ask you to address specific issues.) Or you might use a universal form that's approved by many states.  If your form doesn't tell you what to address, it may be hard to know what to include in your advance directive. Use the questions below to help you get started.  Who do you want to make decisions about your medical care if you are not able to?  What life-support measures do you want if you have a serious illness that gets worse over time or can't be cured?  What are you most afraid of that might happen? (Maybe you're afraid of having pain, losing your independence, or being kept alive by machines.)  Where would you prefer to  die? (Your home? A hospital? A nursing home?)  Do you want to donate your organs when you die?  Do you want certain religious practices performed before you die?  When should you call for help?  Be sure to contact your doctor if you have any questions.  Where can you learn more?  Go to [employer] and enter R264 to learn more about Advance Directives: Care Instructions.  Current as of: December 11, 2023  Content Version: 14.6   2024-2025 [employer] [place] [place].   Care instructions adapted under license by [facility]. If you have questions about a medical condition or this instruction, always ask your healthcare professional. Romayne Alderman, [facility], disclaims any warranty or liability for your use of this information.         A Healthy Heart: Care Instructions  Overview    Coronary artery disease, also called heart disease, occurs when a substance called plaque builds up in the vessels that supply oxygen-rich blood to your heart muscle. This can narrow the blood vessels and reduce blood flow. A heart attack happens when blood flow is completely blocked. A high-fat diet, smoking, and other factors increase the risk of heart disease.  Your doctor has found that you have a chance of having heart disease. A heart-healthy lifestyle can help keep your heart healthy and prevent heart disease. This lifestyle includes eating healthy, being active, staying at a weight that's healthy for you, and not smoking, vaping, or using other tobacco or nicotine products. It also includes taking medicines as directed, managing other health conditions, and trying to get a healthy amount of sleep.  Follow-up care is a key part of your treatment and safety. Be sure to make and go to all appointments, and contact your doctor if you are having problems. It's also a good idea to know your test results and keep a list of the medicines you take.  How can you care for yourself at home?  Diet  Use less salt when you cook  and eat. This helps lower your blood pressure. Taste food before salting. Add only a little salt when you think you need it. With time, your taste buds will adjust to less salt.  Eat fewer snack items, fast foods, canned soups, and other high-salt, high-fat, processed foods.  Read food labels and try to avoid saturated and trans fats. They increase your risk of heart disease by raising cholesterol levels.  Limit the amount of solid fat--butter, margarine, and shortening--you eat. Use olive, peanut, or canola oil when you cook. Bake, broil, and steam foods instead of frying them.  Eat a variety of fruit and vegetables every day. Dark green, deep orange, red, or yellow fruits and vegetables are especially good  for you. Examples include spinach, carrots, peaches, and berries.  Foods high in fiber can reduce your cholesterol and provide important vitamins and minerals. High-fiber foods include whole-grain cereals and breads, oatmeal, beans, brown rice, citrus fruits, and apples.  Eat lean proteins. Heart-healthy proteins include seafood, lean meats and poultry, eggs, beans, peas, nuts, seeds, and soy products.  Limit drinks and foods with added sugar. These include candy, desserts, and soda pop.  Heart-healthy lifestyle  If your doctor recommends it, get more exercise. For many people, walking is a good choice. Or you may want to swim, bike, or do other activities. Bit by bit, increase the time you're active every day. Try for at least 30 minutes on most days of the week.  If you smoke, vape, or use other tobacco or nicotine products, try to quit. If you can't quit, cut back as much as you can. If you need help quitting, talk to your doctor about quit programs and medicines. Quitting is one of the most important things you can do to protect your heart. Also avoid secondhand smoke and the aerosol mist from vaping.  Stay at a weight that's healthy for you. Talk to your doctor if you need help losing weight.  Try to get 7  to 9 hours of sleep each night.  Limit alcohol to 2 drinks a day for men and 1 drink a day for women. Too much alcohol can cause health problems.  Manage other health problems such as diabetes, high blood pressure, and high cholesterol. If you think you may have a problem with alcohol or drug use, talk to your doctor.  Medicines  Take your medicines exactly as prescribed. Contact your doctor if you think you are having a problem with your medicine.  When should you call for help?  Call 911 if you have symptoms of a heart attack. These may include:  Chest pain or pressure, or a strange feeling in the chest.  Sweating.  Shortness of breath.  Pain, pressure, or a strange feeling in the back, neck, jaw, or upper belly or in one or both shoulders or arms.  Lightheadedness or sudden weakness.  A fast or irregular heartbeat.  After you call 911, the operator may tell you to chew 1 adult-strength or 2 to 4 low-dose aspirin . Wait for an ambulance. Do not try to drive yourself.  Watch closely for changes in your health, and be sure to contact your doctor if you have any problems.  Where can you learn more?  Go to [employer] and enter F075 to learn more about A Healthy Heart: Care Instructions.  Current as of: January 10, 2023  Content Version: 14.6   2024-2025 [place], [place].   Care instructions adapted under license by [facility]. If you have questions about a medical condition or this instruction, always ask your healthcare professional. Romayne Alderman, [facility], disclaims any warranty or liability for your use of this information.    Personalized Preventive Plan for GAETAN SPIEKER - 03/03/2024  Medicare offers a range of preventive health benefits. Some of the tests and screenings are paid in full while other may be subject to a deductible, co-insurance, and/or copay.  Some of these benefits include a comprehensive review of your medical history including lifestyle, illnesses that may run in  your family, and various assessments and screenings as appropriate.  After reviewing your medical record and screening and assessments performed today your provider may have ordered immunizations, labs, imaging, and/or referrals for you.  A list of these orders (if applicable) as well as your Preventive Care list are included within your After Visit Summary for your review.           Learning About Living [place]  What is a living will?    A living will, also called a declaration, is a legal form. It tells your loved ones and your doctor your wishes when you can't speak for yourself. It's used by the health professionals who will treat you as you near the end of your life or if you get seriously hurt or ill.  If you put your wishes in writing, your loved ones and others will know what kind of care you want. They won't need to guess. This can ease your mind and be helpful to others. And you can change or cancel your living will at any time.  A living will is not the same as an estate or property will. An estate will explains what you want to happen with your money and property after you die.  How do you use it?  Keep these facts in mind about how a living will is used.  Your living will is used only if you can't speak or make decisions for yourself. Most often, one or more doctors must certify that you can't speak or decide for yourself before your living will takes effect.  If you get better and can speak for yourself again, you can accept or refuse any treatment. It doesn't matter what you said in your living will.  Some states may limit your right to refuse treatment in certain cases. For example, you may need to clearly state in your living will that you don't want artificial hydration and nutrition, such as being fed through a tube.  Is a living will a legal document?  A living will is a legal document. Each state has its own laws about living wills. And a living will may be called something else in your  state.  Here are some things to know about living wills.  You don't need an attorney to complete a living will. But legal advice can be helpful if your state's laws are unclear. It can also help if your health history is complicated or your loved ones can't agree on what should be in your living will.  You can change your living will at any time. Some people find that their wishes about end-of-life care change as their health changes. If you make big changes to your living will, complete a new form.  If you move to another state, make sure that your living will is legal in the state where you now live. In most cases, doctors will respect your wishes even if you have a form from a different state.  You might use a universal form that has been approved by many states. This kind of form can sometimes be filled out and stored online. Your digital copy will then be available wherever you have a connection to the internet. The doctors and nurses who need to treat you can find it right away.  Your state may offer an online registry. This is another place where you can store your living will online.  Some states require a living will to be witnessed or notarized. Forms can be notarized by a person called a Dance movement psychotherapist, who watches as the form is signed and witnessed.  What should you know when you create a living will?  Here are some questions to  ask yourself as you make your living will.  Do you know enough about life support methods that might be used? If not, talk to your doctor so you know what might be done if you can't breathe on your own, your heart stops, or you can't swallow.  What things would you still want to be able to do after you receive life-support methods? Would you want to be able to walk? To speak? To eat on your own? To live without the help of machines?  Do you want certain religious practices performed if you become very ill?  If you have a choice, where do you want to be cared for? In your home? At  a hospital or nursing home?  If you have a choice at the end of your life, where would you prefer to die? At home? In a hospital or nursing home? Somewhere else?  Would you prefer to be buried or cremated?  Do you want your organs to be donated after you die?  What should you do with your living will?  Make sure that your loved ones and your health care agent have copies of your living will (also called a declaration).  Give your doctor a copy of your living will. Ask to have it kept as part of your medical record. If you have more than one doctor, make sure that each one has a copy.  Put a copy of your living will where it can be easily found. For example, some people may put a copy on their refrigerator door. If you are using a digital copy, be sure your doctor, loved ones, and health care agent know how to find and access it.  Where can you learn more?  Go to [employer] and enter K356 to learn more about Learning About Living [place].  Current as of: April 05, 2023  Content Version: 14.6   2024-2025 [place], [place].   Care instructions adapted under license by [facility]. If you have questions about a medical condition or this instruction, always ask your healthcare professional. Romayne Alderman, [facility], disclaims any warranty or liability for your use of this information.         Learning About Medical Power of Attorney  What is a medical power of attorney?     A medical power of attorney, also called a durable power of attorney for health care, is one type of the legal forms called advance directives. It lets you name the person you want to make treatment decisions for you if you can't speak or decide for yourself. The person you choose is called your health care agent. This person is also called a health care proxy or health care surrogate.  A medical power of attorney may be called something else in your state.  How do you choose a health care agent?  Choose your health  care agent carefully. This person may or may not be a family member.  Talk to the person before you make your final decision. Make sure this person is comfortable with this responsibility.  It's a good idea to choose someone who:  Is at least 67 years old.  Knows you well and understands what makes life meaningful for you.  Understands your religious and moral values.  Will do what you want, not what that person wants.  Will be able to make difficult choices at a stressful time.  Will be able to refuse or stop treatment, if that is what you would want, even  if you could die.  Will be firm and confident with health professionals if needed.  Will ask questions to get needed information.  Lives near you or agrees to travel to you if needed.  Your family may help you make medical decisions while you can still be part of that process. But it's important to choose one person to be your health care agent in case you aren't able to make decisions for yourself.  If you don't fill out the legal form and name a health care agent, the decisions your family can make may be limited.  A health care agent may be called something else in your state.  Who will make decisions for you if you don't have a health care agent?  If you don't have a health care agent or a living will, you may not get the care you want. Decisions may be made by family members who disagree about your medical care. Or decisions may be made by a medical professional who doesn't know you well. In some cases, a judge makes the decisions.  When you name a health care agent, it is very clear who has the power to make health decisions for you.  How do you name a health care agent?  You name your health care agent on a legal form. This form is usually called a medical power of attorney. Ask your hospital, state bar association, or office on aging where to find these forms.  You must sign the form to make it legal. Some states require you to get the form notarized.  This means that a person called a notary public watches you sign the form and then the notary signs the form. Some states also require that two or more witnesses sign the form.  Be sure to tell your family members and doctors who your health care agent is.  Where can you learn more?  Go to [employer] and enter P737 to learn more about Learning About Medical Power of [place].  Current as of: April 05, 2023  Content Version: 14.6   2024-2025 [place], [place].   Care instructions adapted under license by [facility]. If you have questions about a medical condition or this instruction, always ask your healthcare professional. Romayne Alderman, [facility], disclaims any warranty or liability for your use of this information.         Advance Care Planning: Care Instructions  Overview    It can be hard to live with an illness that cannot be cured. But if your health is getting worse, you may want to make decisions about end-of-life care. Planning for the end of your life does not mean that you are giving up. It is a way to make sure that your wishes are met. Clearly stating your wishes can make it easier for your loved ones. Making plans while you are still able may also ease your mind and make your final days less stressful and more meaningful.  Follow-up care is a key part of your treatment and safety. Be sure to make and go to all appointments, and call your doctor if you are having problems. It's also a good idea to know your test results and keep a list of the medicines you take.  What can you do to plan for the end of life?  You can bring these issues up with your doctor. You do not need to wait until your doctor starts the conversation. You might start with, What makes life worth living  for me is. . . And then follow it with, I would not be willing to live with . . . When you complete this sentence it helps your doctor understand your wishes.  Talk openly and honestly with your  doctor. This is the best way to understand the decisions you will need to make as your health changes. Know that you can always change your mind.  Ask your doctor about commonly used life-support measures. These include tube feedings, breathing machines, and fluids given through a vein (I.V.). Understanding these treatments will help you decide whether you want them.  You may choose to have these life-supporting treatments for a limited time. This allows a trial period to see whether they will help you. You may also decide that you want your doctor to take only certain measures to keep you alive. It may help to think about the big picture, like what makes life worth living for you or what your values and goals are.  Talk to your doctor about how long you are likely to live. Your doctor may be able to give you an idea of what usually happens with your specific illness.  Think about preparing papers that state your wishes. These papers are called advance directives. If you do this early and review them often, there will not be any confusion about what you want. You can change your instructions at any time.  Which papers should you prepare?  Advance directives are legal papers that tell doctors how you want to be cared for at the end of your life. You do not need a lawyer to write these papers. Ask your doctor or your state health department for information on how to write your advance directives. They may have the forms for each of these types of papers. Make sure your doctor has a copy of these on file, and give a copy to a family member or close friend.  Consider a do-not-resuscitate order (DNR). This order asks that no extra treatments be done if your heart stops or you stop breathing. Extra treatments may include cardiopulmonary resuscitation (CPR), electrical shock to restart your heart, or a machine to breathe for you. If you decide to have a DNR order, ask your doctor to explain and write it. Place the order in  your home where everyone can easily see it.  Consider a living will. A living will explains your wishes about life support and other treatments at the end of your life if you become unable to speak for yourself. Living wills tell doctors to use or not use treatments that would keep you alive. Your state may require that you have one or two witnesses or a notary present when you sign this form. A living will may be called something else in your state.  Consider a medical power of attorney. This form allows you to name a person to make decisions about your care if you are not able to. Most people ask a close friend or family member. Talk to this person about the kinds of treatments you want and those that you do not want. Make sure this person understands your wishes. A medical power of attorney may be called something else in your state.  These legal papers are simple to change. Tell your doctor what you want to change, and ask them to make a note in your medical file. Give your family updated copies of the papers.  Where can you learn more?  Go to [employer] and enter  P184 to learn more about Advance Care Planning: Care Instructions.  Current as of: May 30, 2023  Content Version: 14.6   2024-2025 [place], [address].   Care instructions adapted under license by [facility]. If you have questions about a medical condition or this instruction, always ask your healthcare professional. Romayne Alderman, [facility], disclaims any warranty or liability for your use of this information.    Alcohol Abuse and Alcoholism   (Alcohol Dependence; Alcohol Use Disorder)       Definition   Alcohol abuse is excessive or problematic alcohol consumption. It can progress to alcoholism.   Alcoholism is chronic alcohol abuse that results in a physical dependence on alcohol (withdrawal symptoms) and an inability to stop or limit drinking.   Causes   Several factors can contribute to alcohol abuse and  alcoholism, including:   Genes   Brain chemicals that may be different than normal   Social pressure   Emotional stress   Pain   Depression and other mental health problems   Problem drinking behaviors learned from family or friends   Risk Factors   These factors increase your chance of developing alcoholism. Tell your doctor if you have any of these risk factors:   Sex: male   Family members who abuse alcohol (especially men whose fathers or brothers are alcoholic)   Starting to use alcohol at an early age (younger than 18)   Using illicit drugs or non-medical use of prescription drugs   Peer pressure   Easy access to alcoholic beverages   Psychiatric disorders, such as depression or anxiety   Smoking   Symptoms   It is common to deny an alcohol problem. Alcohol abuse can occur without physical dependence.   Alcohol abuse symptoms include:   Repeated work, school, or home problems due to drinking   Risking physical safety   Recurring trouble with the law, often including drinking and driving   Continuing to drink despite alcohol-related difficulties   Symptoms of alcoholism include:   Craving a drink   Unable to stop or limit drinking   Needing greater amounts of alcohol to feel the same effect   Giving up activities in order to drink or recover from alcohol   Drinking that continues even when it causes or worsens health problems   Wanting to stop or reduce drinking, but not being able   Withdrawal symptoms if alcohol is stopped include:   Nausea   Sweating   Shaking   Anxiety   Increased blood pressure   Seizures ( delirium tremens [DTs])   The brain, nervous system, heart, liver, stomach, gastrointestinal tract, and pancreas can all be damaged by alcoholism.     Some of the Organs Damaged in Alcohol Abuse        2011 [employer]   Diagnosis   Doctors ask a series of questions to assess possible alcohol-related problems, including:   Have you tried to reduce your drinking?   Have you felt bad about  drinking?   Have you been annoyed by another person's criticism of your drinking?   Do you drink in the morning to steady your nerves or cure a hangover?   Do you have problems with a job, your family, or the law?   Do you drive under the influence of alcohol?   Blood tests may be done to:   Look at the size of your red blood cells and to check for a substance called carbohydrate-deficient transferrin  Check for alcohol-related liver disease and other health problems   Treatment   Treatment for alcohol abuse or dependence is aimed at teaching patients how to manage the disease. Most professionals believe that this means giving up alcohol completely and permanently.   The first and most important step is recognizing a problem exists. Successful treatment depends on your desire to change. Your doctor can help you withdraw from alcohol safely. This could require hospitalization in a detoxification center. They will carefully monitor you for side effects. You may need medication while you are undergoing detoxification.   Treatments include:   Medications    Drugs can help relieve some of the symptoms of withdrawal and help prevent relapse. The doctor may prescribe medication to reduce cravings for alcohol.   Medications used to treat alcoholism and to try to prevent drinking include:   Naltrexone (ReVia, Vivitrol)blocks the high that makes you crave alcohol   Disulfiram (Antabuse)makes you very sick if you drink alcohol   Acamprosate (Campral)reduces your craving for alcohol   A study showed that an anticonvulsant drug, topiramate  (Topamax ), may reduce alcohol dependence.   Education and Counseling    Therapy helps you to recognize alcohol's dangers. Education raises awareness of underlying issues and lifestyles that promote drinking. In therapy, you work to improve coping skills and learn other ways of dealing with stress or pain.   Mentoring and Community Help    Alcoholics Anonymous (AA) helps many people to stop  drinking and stay sober. Members meet regularly and support each other. Your family members may also benefit from attending meetings of Al-Anon. Living with an alcoholic can be a painful, stressful situation.   Here are some general statistics on treatment outcomes of individuals one year after attempting to stop drinking:   1/3 remained abstinent   1/3 resumed drinking but at a lower level   1/3 relapsed completely   If you are diagnosed with alcohol abuse or alcoholism, follow your doctor's instructions .   Prevention   Realizing that alcohol causes problems helps some people avoid it. Suggestions to decrease the risk of alcohol abuse and dependence include:   Socialize without alcohol.   Avoid going to bars.   Do not keep alcohol in your home.   Avoid situations and people that encourage drinking.   Make new nondrinking friends.   Do fun things that do not involve alcohol.   Avoid reaching for a drink when stressed or upset.   Limit your alcohol intake to a moderate level.   Moderate is two or fewer drinks per day for men and one or fewer for women and older adults   A 12-ounce bottle of beer, a five-ounce glass of wine, or 1.5 ounces of liquor is considered one drink   If you are a parent, having a good relationship with your children may reduce their risk of alcohol abuse.   Most professionals who treat alcohol abuse and dependence believe that complete abstinence is the only effective prevention.     Last Reviewed: September 2010 Bernardino Kern, MD, PhD, MPH   Updated: 03/01/2009       DASH Diet: After Your Visit  Your Care Instructions  The DASH diet is an eating plan that can help lower your blood pressure. DASH stands for Dietary Approaches to Stop Hypertension. Hypertension is high blood pressure.  The DASH diet focuses on eating foods that are high in calcium, potassium, and magnesium . These nutrients can lower blood pressure. The foods that are  highest in these nutrients are fruits, vegetables, low-fat  dairy products, nuts, seeds, and legumes. But taking calcium, potassium, and magnesium  supplements instead of eating foods that are high in those nutrients does not have the same effect. The DASH diet also includes whole grains, fish, and poultry.  The DASH diet is one of several lifestyle changes your doctor may recommend to lower your high blood pressure. Your doctor may also want you to decrease the amount of sodium in your diet. Lowering sodium while following the DASH diet can lower blood pressure even further than just the DASH diet alone.  Follow-up care is a key part of your treatment and safety. Be sure to make and go to all appointments, and call your doctor if you are having problems. It's also a good idea to know your test results and keep a list of the medicines you take.  How can you care for yourself at home?  Following the DASH diet  Eat 4 to 5 servings of fruit each day. A serving is 1 medium-sized piece of fruit,  cup chopped or canned fruit, 1/4 cup dried fruit, or 4 ounces ( cup) of fruit juice. Choose fruit more often than fruit juice.  Eat 4 to 5 servings of vegetables each day. A serving is 1 cup of lettuce or raw leafy vegetables,  cup of chopped or cooked vegetables, or 4 ounces ( cup) of vegetable juice. Choose vegetables more often than vegetable juice.  Get 2 to 3 servings of low-fat and fat-free dairy each day. A serving is 8 ounces of milk, 1 cup of yogurt, or 1  ounces of cheese.  Eat 7 to 8 servings of grains each day. A serving is 1 slice of bread, 1 ounce of dry cereal, or  cup of cooked rice, pasta, or cooked cereal. Try to choose whole-grain products as much as possible.  Limit lean meat, poultry, and fish to 6 ounces each day. Six ounces is about the size of two decks of cards.  Eat 4 to 5 servings of nuts, seeds, and legumes (cooked dried beans, lentils, and split peas) each week. A serving is 1/3 cup of nuts, 2 tablespoons of seeds, or  cup cooked dried beans or  peas.  Limit sweets and added sugars to 5 servings or less a week. A serving is 1 tablespoon jelly or jam,  cup sorbet, or 1 cup of lemonade.  Tips for success  Start small. Do not try to make dramatic changes to your diet all at once. You might feel that you are missing out on your favorite foods and then be more likely to not follow the plan. Make small changes, and stick with them. Once those changes become habit, add a few more changes.  Try some of the following:  Make it a goal to eat a fruit or vegetable at every meal and at snacks. This will make it easy to get the recommended amount of fruits and vegetables each day.  Try yogurt topped with fruit and nuts for a snack or healthy dessert.  Add lettuce, tomato, cucumber, and onion to sandwiches.  Combine a ready-made pizza crust with low-fat mozzarella cheese and lots of vegetable toppings. Try using tomatoes, squash, spinach, broccoli, carrots, cauliflower, and onions.  Have a variety of cut-up vegetables with a low-fat dip as an appetizer instead of chips and dip.  Sprinkle sunflower seeds or chopped almonds over salads. Or try adding chopped walnuts or almonds to cooked vegetables.  Try  some vegetarian meals using beans and peas. Add garbanzo or kidney beans to salads. Make burritos and tacos with mashed pinto beans or black beans     2006-2012 [employer]. Care instructions adapted under license by [facility]. This care instruction is for use with your licensed healthcare professional. If you have questions about a medical condition or this instruction, always ask your healthcare professional. [employer] disclaims any warranty or liability for your use of this information.  Content Version: 9.4.94723; Last Revised: August 25, 2010                Heart-Healthy Diet   Sodium, Fat, and Cholesterol Controlled Diet       What Is a Heart Healthy Diet?   A heart-healthy diet is one that limits sodium , certain types of  fat , and cholesterol . This type of diet is recommended for:   People with any form of cardiovascular disease (eg, coronary heart disease , peripheral vascular disease , previous heart attack , previous stroke )   People with risk factors for cardiovascular disease, such as high blood pressure , high cholesterol , or diabetes   Anyone who wants to lower their risk of developing cardiovascular disease   Sodium    Sodium is a mineral found in many foods. In general, most people consume much more sodium than they need. Diets high in sodium can increase blood pressure and lead to edema (water  retention). On a heart-healthy diet, you should consume no more than 2,300 mg (milligrams) of sodium per dayabout the amount in one teaspoon of table salt. The foods highest in sodium include table salt (about 50% sodium), processed foods, convenience foods, and preserved foods.   Cholesterol    Cholesterol is a fat-like, waxy substance in your blood. Our bodies make some cholesterol. It is also found in animal products, with the highest amounts in fatty meat, egg yolks, whole milk, cheese, shellfish, and organ meats. On a heart-healthy diet, you should limit your cholesterol intake to less than 200 mg per day.   It is normal and important to have some cholesterol in your bloodstream. But too much cholesterol can cause plaque to build up within your arteries, which can eventually lead to a heart attack or stroke.   The two types of cholesterol that are most commonly referred to are:   Low-density lipoprotein (LDL) cholesterol  Also known as bad cholesterol, this is the cholesterol that tends to build up along your arteries. Bad cholesterol levels are increased by eating fats that are saturated or hydrogenated. Optimal level of this cholesterol is less than 100. Over 130 starts to get risky for heart disease.   High-density lipoprotein (HDL) cholesterol  Also known as good cholesterol, this type of cholesterol actually carries  cholesterol away from your arteries and may, therefore, help lower your risk of having a heart attack. You want this level to be high (ideally greater than 60). It is a risk to have a level less than 40. You can raise this good cholesterol by eating olive oil, canola oil, avocados, or nuts. Exercise raises this level, too.   Fat    Fat is calorie dense and packs a lot of calories into a small amount of food. Even though fats should be limited due to their high calorie content, not all fats are bad. In fact, some fats are quite healthful. Fat can be broken down into four main types.   The good-for-you fats are:  Monounsaturated fat  found in oils such as olive and canola, avocados, and nuts and natural nut butters; can decrease cholesterol levels, while keeping levels of HDL cholesterol high   Polyunsaturated fat  found in oils such as safflower, sunflower, soybean, corn, and sesame; can decrease total cholesterol and LDL cholesterol   Omega-3 fatty acids  particularly those found in fatty fish (such as salmon, trout, tuna, mackerel, herring, and sardines); can decrease risk of arrhythmias, decrease triglyceride levels, and slightly lower blood pressure   The fats that you want to limit are:   Saturated fat  found in animal products, many fast foods, and a few vegetables; increases total blood cholesterol, including LDL levels   Animal fats that are saturated include: butter, lard, whole-milk dairy products, meat fat, and poultry skin   Vegetable fats that are saturated include: hydrogenated shortening, palm oil, coconut oil, cocoa butter   Hydrogenated or trans fat  found in margarine and vegetable shortening, most shelf stable snack foods, and fried foods; increases LDL and decreases HDL     It is generally recommended that you limit your total fat for the day to less than 30% of your total calories. If you follow an 1800-calorie heart healthy diet, for example, this would mean 60 grams of fat or less per day.    Saturated fat and trans fat in your diet raises your blood cholesterol the most, much more than dietary cholesterol does. For this reason, on a heart-healthy diet, less than 7% of your calories should come from saturated fat and ideally 0% from trans fat. On an 1800-calorie diet, this translates into less than 14 grams of saturated fat per day, leaving 46 grams of fat to come from mono- and polyunsaturated fats.   Food Choices on a Heart Healthy Diet   Food Category   Foods Recommended   Foods to Avoid   Grains   Breads and rolls without salted tops Most dry and cooked cereals Unsalted crackers and breadsticks Low-sodium or homemade breadcrumbs or stuffing All rice and pastas   Breads, rolls, and crackers with salted tops High-fat baked goods (eg, muffins, donuts, pastries) Quick breads, self-rising flour, and biscuit mixes Regular bread crumbs Instant hot cereals Commercially prepared rice, pasta, or stuffing mixes   Vegetables   Most fresh, frozen, and low-sodium canned vegetables Low-sodium and salt-free vegetable juices Canned vegetables if unsalted or rinsed   Regular canned vegetables and juices, including sauerkraut and pickled vegetables Frozen vegetables with sauces Commercially prepared potato and vegetable mixes   Fruits   Most fresh, frozen, and canned fruits All fruit juices   Fruits processed with salt or sodium   Milk   Nonfat or low-fat (1%) milk Nonfat or low-fat yogurt Cottage cheese, low-fat ricotta, cheeses labeled as low-fat and low-sodium   Whole milk Reduced-fat (2%) milk Malted and chocolate milk Full fat yogurt Most cheeses (unless low-fat and low salt) Buttermilk (no more than 1 cup per week)   Meats and Beans   Lean cuts of fresh or frozen beef, veal, lamb, or pork (look for the word loin) Fresh or frozen poultry without the skin Fresh or frozen fish and some shellfish Egg whites and egg substitutes (Limit whole eggs to three per week) Tofu Nuts or seeds (unsalted, dry-roasted),  low-sodium peanut butter Dried peas, beans, and lentils   Any smoked, cured, salted, or canned meat, fish, or poultry (including bacon, chipped beef, cold cuts, hot dogs, sausages, sardines, and anchovies) Poultry skins Breaded and/or  fried fish or meats Canned peas, beans, and lentils Salted nuts   Fats and Oils   Olive oil and canola oil Low-sodium, low-fat salad dressings and mayonnaise   Butter, margarine, coconut and palm oils, bacon fat   Snacks, Sweets, and Condiments   Low-sodium or unsalted versions of broths, soups, soy sauce, and condiments Pepper, herbs, and spices; vinegar, lemon, or lime juice Low-fat frozen desserts (yogurt, sherbet, fruit bars) Sugar, cocoa powder, honey, syrup, jam, and preserves Low-fat, trans-fat free cookies, cakes, and pies Graham and animal crackers, fig bars, ginger snaps   High-fat desserts Broth, soups, gravies, and sauces, made from instant mixes or other high-sodium ingredients Salted snack foods Canned olives Meat tenderizers, seasoning salt, and most flavored vinegars   Beverages   Low-sodium carbonated beverages Tea and coffee in moderation Soy milk   Commercially softened water    Suggestions   Make whole grains, fruits, and vegetables the base of your diet.    Choose heart-healthy fats such as canola, olive, and flaxseed oil, and foods high in heart-healthy fats, such as nuts, seeds, soybeans, tofu, and fish.    Eat fish at least twice per week; the fish highest in omega-3 fatty acids and lowest in mercury include salmon, herring, mackerel, sardines, and canned chunk light tuna. If you eat fish less than twice per week or have high triglycerides, talk to your doctor about taking fish oil supplements.    Read food labels.   For products low in fat and cholesterol, look for fat free, low-fat, cholesterol free, saturated fat free, and trans fat freeAlso scan the Nutrition Facts Label, which lists saturated fat, trans fat, and cholesterol amounts.   For products low in  sodium, look for sodium free, very low sodium, low sodium, no added salt, and unsalted   Skip the salt when cooking or at the table; if food needs more flavor, get creative and try out different herbs and spices. Garlic and onion also add substantial flavor to foods.    Trim any visible fat off meat and poultry before cooking, and drain the fat off after browning.    Use cooking methods that require little or no added fat, such as grilling, boiling, baking, poaching, broiling, roasting, steaming, stir-frying, and sauting.    Avoid fast food and convenience food. They tend to be high in saturated and trans fat and have a lot of added salt.    Talk to a registered dietitian for individualized diet advice.      Last Reviewed: March 2011 Hadassah Clause, MS, MPH, RD   Updated: 09/07/2009     Heart-Healthy Diet   Sodium, Fat, and Cholesterol Controlled Diet       What Is a Heart Healthy Diet?   A heart-healthy diet is one that limits sodium , certain types of fat , and cholesterol . This type of diet is recommended for:   People with any form of cardiovascular disease (eg, coronary heart disease , peripheral vascular disease , previous heart attack , previous stroke )   People with risk factors for cardiovascular disease, such as high blood pressure , high cholesterol , or diabetes   Anyone who wants to lower their risk of developing cardiovascular disease   Sodium    Sodium is a mineral found in many foods. In general, most people consume much more sodium than they need. Diets high in sodium can increase blood pressure and lead to edema (water  retention). On a heart-healthy diet, you should consume no  more than 2,300 mg (milligrams) of sodium per dayabout the amount in one teaspoon of table salt. The foods highest in sodium include table salt (about 50% sodium), processed foods, convenience foods, and preserved foods.   Cholesterol    Cholesterol is a fat-like, waxy substance in your blood. Our bodies make some cholesterol.  It is also found in animal products, with the highest amounts in fatty meat, egg yolks, whole milk, cheese, shellfish, and organ meats. On a heart-healthy diet, you should limit your cholesterol intake to less than 200 mg per day.   It is normal and important to have some cholesterol in your bloodstream. But too much cholesterol can cause plaque to build up within your arteries, which can eventually lead to a heart attack or stroke.   The two types of cholesterol that are most commonly referred to are:   Low-density lipoprotein (LDL) cholesterol  Also known as bad cholesterol, this is the cholesterol that tends to build up along your arteries. Bad cholesterol levels are increased by eating fats that are saturated or hydrogenated. Optimal level of this cholesterol is less than 100. Over 130 starts to get risky for heart disease.   High-density lipoprotein (HDL) cholesterol  Also known as good cholesterol, this type of cholesterol actually carries cholesterol away from your arteries and may, therefore, help lower your risk of having a heart attack. You want this level to be high (ideally greater than 60). It is a risk to have a level less than 40. You can raise this good cholesterol by eating olive oil, canola oil, avocados, or nuts. Exercise raises this level, too.   Fat    Fat is calorie dense and packs a lot of calories into a small amount of food. Even though fats should be limited due to their high calorie content, not all fats are bad. In fact, some fats are quite healthful. Fat can be broken down into four main types.   The good-for-you fats are:   Monounsaturated fat  found in oils such as olive and canola, avocados, and nuts and natural nut butters; can decrease cholesterol levels, while keeping levels of HDL cholesterol high   Polyunsaturated fat  found in oils such as safflower, sunflower, soybean, corn, and sesame; can decrease total cholesterol and LDL cholesterol   Omega-3 fatty acids  particularly those  found in fatty fish (such as salmon, trout, tuna, mackerel, herring, and sardines); can decrease risk of arrhythmias, decrease triglyceride levels, and slightly lower blood pressure   The fats that you want to limit are:   Saturated fat  found in animal products, many fast foods, and a few vegetables; increases total blood cholesterol, including LDL levels   Animal fats that are saturated include: butter, lard, whole-milk dairy products, meat fat, and poultry skin   Vegetable fats that are saturated include: hydrogenated shortening, palm oil, coconut oil, cocoa butter   Hydrogenated or trans fat  found in margarine and vegetable shortening, most shelf stable snack foods, and fried foods; increases LDL and decreases HDL     It is generally recommended that you limit your total fat for the day to less than 30% of your total calories. If you follow an 1800-calorie heart healthy diet, for example, this would mean 60 grams of fat or less per day.   Saturated fat and trans fat in your diet raises your blood cholesterol the most, much more than dietary cholesterol does. For this reason, on a heart-healthy diet, less than 7%  of your calories should come from saturated fat and ideally 0% from trans fat. On an 1800-calorie diet, this translates into less than 14 grams of saturated fat per day, leaving 46 grams of fat to come from mono- and polyunsaturated fats.   Food Choices on a Heart Healthy Diet   Food Category   Foods Recommended   Foods to Avoid   Grains   Breads and rolls without salted tops Most dry and cooked cereals Unsalted crackers and breadsticks Low-sodium or homemade breadcrumbs or stuffing All rice and pastas   Breads, rolls, and crackers with salted tops High-fat baked goods (eg, muffins, donuts, pastries) Quick breads, self-rising flour, and biscuit mixes Regular bread crumbs Instant hot cereals Commercially prepared rice, pasta, or stuffing mixes   Vegetables   Most fresh, frozen, and low-sodium canned  vegetables Low-sodium and salt-free vegetable juices Canned vegetables if unsalted or rinsed   Regular canned vegetables and juices, including sauerkraut and pickled vegetables Frozen vegetables with sauces Commercially prepared potato and vegetable mixes   Fruits   Most fresh, frozen, and canned fruits All fruit juices   Fruits processed with salt or sodium   Milk   Nonfat or low-fat (1%) milk Nonfat or low-fat yogurt Cottage cheese, low-fat ricotta, cheeses labeled as low-fat and low-sodium   Whole milk Reduced-fat (2%) milk Malted and chocolate milk Full fat yogurt Most cheeses (unless low-fat and low salt) Buttermilk (no more than 1 cup per week)   Meats and Beans   Lean cuts of fresh or frozen beef, veal, lamb, or pork (look for the word loin) Fresh or frozen poultry without the skin Fresh or frozen fish and some shellfish Egg whites and egg substitutes (Limit whole eggs to three per week) Tofu Nuts or seeds (unsalted, dry-roasted), low-sodium peanut butter Dried peas, beans, and lentils   Any smoked, cured, salted, or canned meat, fish, or poultry (including bacon, chipped beef, cold cuts, hot dogs, sausages, sardines, and anchovies) Poultry skins Breaded and/or fried fish or meats Canned peas, beans, and lentils Salted nuts   Fats and Oils   Olive oil and canola oil Low-sodium, low-fat salad dressings and mayonnaise   Butter, margarine, coconut and palm oils, bacon fat   Snacks, Sweets, and Condiments   Low-sodium or unsalted versions of broths, soups, soy sauce, and condiments Pepper, herbs, and spices; vinegar, lemon, or lime juice Low-fat frozen desserts (yogurt, sherbet, fruit bars) Sugar, cocoa powder, honey, syrup, jam, and preserves Low-fat, trans-fat free cookies, cakes, and pies Graham and animal crackers, fig bars, ginger snaps   High-fat desserts Broth, soups, gravies, and sauces, made from instant mixes or other high-sodium ingredients Salted snack foods Canned olives Meat tenderizers, seasoning  salt, and most flavored vinegars   Beverages   Low-sodium carbonated beverages Tea and coffee in moderation Soy milk   Commercially softened water    Suggestions   Make whole grains, fruits, and vegetables the base of your diet.    Choose heart-healthy fats such as canola, olive, and flaxseed oil, and foods high in heart-healthy fats, such as nuts, seeds, soybeans, tofu, and fish.    Eat fish at least twice per week; the fish highest in omega-3 fatty acids and lowest in mercury include salmon, herring, mackerel, sardines, and canned chunk light tuna. If you eat fish less than twice per week or have high triglycerides, talk to your doctor about taking fish oil supplements.    Read food labels.   For products low in fat and  cholesterol, look for fat free, low-fat, cholesterol free, saturated fat free, and trans fat freeAlso scan the Nutrition Facts Label, which lists saturated fat, trans fat, and cholesterol amounts.   For products low in sodium, look for sodium free, very low sodium, low sodium, no added salt, and unsalted   Skip the salt when cooking or at the table; if food needs more flavor, get creative and try out different herbs and spices. Garlic and onion also add substantial flavor to foods.    Trim any visible fat off meat and poultry before cooking, and drain the fat off after browning.    Use cooking methods that require little or no added fat, such as grilling, boiling, baking, poaching, broiling, roasting, steaming, stir-frying, and sauting.    Avoid fast food and convenience food. They tend to be high in saturated and trans fat and have a lot of added salt.    Talk to a registered dietitian for individualized diet advice.      Last Reviewed: March 2011 Hadassah Clause, MS, MPH, RD   Updated: 09/07/2009     High-Fiber Diet     What Is Fiber?   Dietary fiber is a form of carbohydrate found in plants that cannot be digested by humans. All plants contain fiber, including fruits, vegetables, grains, and legumes.  Fiber is often classified into two categories: soluble and insoluble.   Soluble fiber draws water  into the bowel and can help slow digestion. Examples of foods that are high in soluble fiber include oatmeal, oat bran, barley, legumes (eg, beans and peas), apples, and strawberries.   Insoluble fiber speeds digestion and can add bulk to the stool. Examples of foods that are high in insoluble fiber include whole-wheat products, wheat bran, cauliflower, green beans, and potatoes.   Why Follow a High-Fiber Diet?   A high-fiber diet is often recommended to prevent and treat constipation , hemorrhoids , diverticulitis , and irritable bowel syndrome . Eating a high-fiber diet can also help improve your cholesterol levels, lower your risk of coronary heart disease , reduce your risk of type 2 diabetes , and lower your weight. For people with type 1 or 2 diabetes, a high-fiber diet can also help stabilize blood sugar levels.   How Much Fiber Should I Eat?   A high-fiber diet should contain  20-35 grams  of fiber a day. This is actually the amount recommended for the general adult population; however, most Americans eat only 15 grams of fiber per day.   Digestion of Fiber   Eating a higher fiber diet than usual can take some getting used to by your body's digestive system. To avoid the side effects of sudden increases in dietary fiber (eg, gas, cramping, bloating, and diarrhea), increase fiber gradually and be sure to drink plenty of fluids every day.   Tips for Increasing Fiber Intake   Whenever possible, choose whole grains over refined grains (eg, brown rice instead of white rice, whole-wheat bread instead of white bread).    Include a variety of grains in your diet, such as wheat, rye, barley, oats, quinoa, and bulgur.    Eat more vegetarian-based meals. Here are some ideas: black bean burgers, eggplant lasagna, and veggie tofu stir-fry.    Choose high-fiber snacks, such as fruits, popcorn, whole-grain crackers, and nuts.     Make whole-grain cereal or whole-grain toast part of your daily breakfast regime.    When eating out, whether ordering a sandwich or dinner, ask for extra vegetables.  When baking, replace part of the white flour with whole-wheat flour. Whole-wheat flour is particularly easy to incorporate into a recipe.    High-Fiber Diet Eating Guide   Food Category   Foods Recommended   Notes   Grains   Whole-grain breads, muffins, bagels, or pita bread Rye bread Whole-wheat crackers or crisp breads Whole-grain or bran cereals Oatmeal, oat bran, or grits Wheat germ Whole-wheat pasta and brown rice   Read the ingredients list on food labels. Look for products that list whole as the first ingredient (eg, whole-wheat, whole oats). Choose cereals with at least 2 grams of fiber per serving.   Vegetables   All vegetables, especially asparagus, bean sprouts, broccoli, Brussels sprouts, cabbage, carrots, cauliflower, celery, corn, greens, green beans, green pepper, onions, peas, potatoes (with skin), snow peas, spinach, squash, sweet potatoes, tomatoes, zucchini   For maximum fiber intake, eat the peels of fruits and vegetablesjust be sure to wash them well first.   Fruits   All fruits, especially apples, berries, grapefruits, mangoes, nectarines, oranges, peaches, pears, dried fruits (figs, dates, prunes, raisins)   Choose raw fruits and vegetables over juice, cooked, or cannedraw fruit has more fiber. Dried fruit is also a good source of fiber.   Milk   With the exception of yogurt containing inulin (a type of fiber), dairy foods provide little fiber.   Add more fiber by topping your yogurt or cottage cheese with fresh fruit, whole grain or bran cereals, nuts, or seeds.   Meats and Beans   All beans and peas, especially Garbanzo beans, kidney beans, lentils, lima beans, split peas, and pinto beans All nuts and seeds, especially almonds, peanuts, [place] nuts, cashews, peanut butter, walnuts, sesame and sunflower seeds All meat,  poultry, fish, and eggs   Increase fiber in meat dishes by adding pinto beans, kidney beans, black-eyed peas, bran, or oatmeal. If you are following a low-fat diet, use nuts and seeds only in moderation.   Fats and Oils   All in moderation   Fats and oils do not provide fiber   Snacks, Sweets, and Condiments   Fruit Nuts Popcorn, whole-wheat pretzels, or trail mix made with dried fruits, nuts, and seeds Cakes, breads, and cookies made with oatmeal or whole-wheat flour   Most snack foods do not provide much fiber. Choose snacks with at least 2 grams of fiber per serving.     Last Reviewed: March 2011 Hadassah Clause, MS, MPH, RD   Updated: 09/07/2009     Keep Your Memory [place]       Many factors can affect your ability to remembera hectic lifestyle, aging, stress, chronic disease, and certain medicines. But, there are steps you can take to sharpen your mind and help preserve your memory.   Challenge Your Brain   Regularly challenging your mind may help keeps it in top shape. Good mental exercises include:   Crossword puzzlesUse a dictionary if you need it; you will learn more that way.   Brainteasers Try some!   Crafts, such as wood working and Microbiologist, such as gardening and Teacher, English as a foreign language old friends or join groups to meet new ones.   Reading   Learning a new language   Taking a class, whether it be art history or tai chi   TravelingExperience the food, history, and culture of your destination   Learning to use a computer   Going to museums, the theater, or thought-provoking movies   Changing  things in your daily life, such as reversing your pattern in the grocery store or brushing your teeth using your nondominant hand   Use Memory Aids   There is no need to remember every detail on your own. These memory aids can help:   Calendars and day planners   Electronic organizers to store all sorts of helpful informationThese devices can beep to remind you of appointments.   A book of  days to record birthdays, anniversaries, and other occasions that occur on the same date every year   Detailed to-do lists and strategically placed sticky notes   Quick study sessionsBefore a gathering, review who will be there so their names will be fresh in your mind.   Establish routinesFor example, keep your keys, wallet, and umbrella in the same place all the time or take medicine with your 8:00 AM glass of juice   Live a Healthy Life   Many actions that will keep your body strong will do the same for your mind. For example:   Talk to Your Doctor About Herbs and Supplements    Malnutrition and vitamin deficiencies can impair your mental function. For example, vitamin B12 deficiency can cause a range of symptoms, including confusion. But, what if your nutritional needs are being met? Can herbs and supplements still offer a benefit? Researchers have investigated a range of natural remedies, such as ginkgo , ginseng , and the supplement phosphatidylserine (PS). So far, though, the evidence is inconsistent as to whether these products can improve memory or thinking.   If you are interested in taking herbs and supplements, talk to your doctor first because they may interact with other medicines that you are taking.   Exercise Regularly    Among the many benefits of regular exercise are increased blood flow to the brain and decreased risk of certain diseases that can interfere with memory function. One study found that even moderate exercise has a beneficial effect. Examples of moderate exercise include:   Playing 18 holes of golf once a week, without a cart   Playing tennis twice a week   Walking one mile per day   Manage Stress    It can be tough to remember what is important when your mind is cluttered. Make time for relaxation. Choose activities that calm you down, and make it routine.   Manage Chronic Conditions    Side effects of high blood pressure , diabetes, and heart disease can interfere with mental  function. Many of the lifestyle steps discussed here can help manage these conditions. Strive to eat a healthy diet, exercise regularly, get stress under control, and follow your doctor's advice for your condition.   Minimize Medications    Talk to your doctor about the medicines that you take. Some may be unnecessary. Also, healthy lifestyle habits may lower the need for certain drugs.     Last Reviewed: April 2010 Redell Counts, MD   Updated: 09/22/2008     Heart-Healthy Diet   Sodium, Fat, and Cholesterol Controlled Diet       What Is a Heart Healthy Diet?   A heart-healthy diet is one that limits sodium , certain types of fat , and cholesterol . This type of diet is recommended for:   People with any form of cardiovascular disease (eg, coronary heart disease , peripheral vascular disease , previous heart attack , previous stroke )   People with risk factors for cardiovascular disease, such as high blood pressure , high cholesterol ,  or diabetes   Anyone who wants to lower their risk of developing cardiovascular disease   Sodium    Sodium is a mineral found in many foods. In general, most people consume much more sodium than they need. Diets high in sodium can increase blood pressure and lead to edema (water  retention). On a heart-healthy diet, you should consume no more than 2,300 mg (milligrams) of sodium per dayabout the amount in one teaspoon of table salt. The foods highest in sodium include table salt (about 50% sodium), processed foods, convenience foods, and preserved foods.   Cholesterol    Cholesterol is a fat-like, waxy substance in your blood. Our bodies make some cholesterol. It is also found in animal products, with the highest amounts in fatty meat, egg yolks, whole milk, cheese, shellfish, and organ meats. On a heart-healthy diet, you should limit your cholesterol intake to less than 200 mg per day.   It is normal and important to have some cholesterol in your bloodstream. But too much cholesterol  can cause plaque to build up within your arteries, which can eventually lead to a heart attack or stroke.   The two types of cholesterol that are most commonly referred to are:   Low-density lipoprotein (LDL) cholesterol  Also known as bad cholesterol, this is the cholesterol that tends to build up along your arteries. Bad cholesterol levels are increased by eating fats that are saturated or hydrogenated. Optimal level of this cholesterol is less than 100. Over 130 starts to get risky for heart disease.   High-density lipoprotein (HDL) cholesterol  Also known as good cholesterol, this type of cholesterol actually carries cholesterol away from your arteries and may, therefore, help lower your risk of having a heart attack. You want this level to be high (ideally greater than 60). It is a risk to have a level less than 40. You can raise this good cholesterol by eating olive oil, canola oil, avocados, or nuts. Exercise raises this level, too.   Fat    Fat is calorie dense and packs a lot of calories into a small amount of food. Even though fats should be limited due to their high calorie content, not all fats are bad. In fact, some fats are quite healthful. Fat can be broken down into four main types.   The good-for-you fats are:   Monounsaturated fat  found in oils such as olive and canola, avocados, and nuts and natural nut butters; can decrease cholesterol levels, while keeping levels of HDL cholesterol high   Polyunsaturated fat  found in oils such as safflower, sunflower, soybean, corn, and sesame; can decrease total cholesterol and LDL cholesterol   Omega-3 fatty acids  particularly those found in fatty fish (such as salmon, trout, tuna, mackerel, herring, and sardines); can decrease risk of arrhythmias, decrease triglyceride levels, and slightly lower blood pressure   The fats that you want to limit are:   Saturated fat  found in animal products, many fast foods, and a few vegetables; increases total blood  cholesterol, including LDL levels   Animal fats that are saturated include: butter, lard, whole-milk dairy products, meat fat, and poultry skin   Vegetable fats that are saturated include: hydrogenated shortening, palm oil, coconut oil, cocoa butter   Hydrogenated or trans fat  found in margarine and vegetable shortening, most shelf stable snack foods, and fried foods; increases LDL and decreases HDL     It is generally recommended that you limit your total fat for the  day to less than 30% of your total calories. If you follow an 1800-calorie heart healthy diet, for example, this would mean 60 grams of fat or less per day.   Saturated fat and trans fat in your diet raises your blood cholesterol the most, much more than dietary cholesterol does. For this reason, on a heart-healthy diet, less than 7% of your calories should come from saturated fat and ideally 0% from trans fat. On an 1800-calorie diet, this translates into less than 14 grams of saturated fat per day, leaving 46 grams of fat to come from mono- and polyunsaturated fats.   Food Choices on a Heart Healthy Diet   Food Category   Foods Recommended   Foods to Avoid   Grains   Breads and rolls without salted tops Most dry and cooked cereals Unsalted crackers and breadsticks Low-sodium or homemade breadcrumbs or stuffing All rice and pastas   Breads, rolls, and crackers with salted tops High-fat baked goods (eg, muffins, donuts, pastries) Quick breads, self-rising flour, and biscuit mixes Regular bread crumbs Instant hot cereals Commercially prepared rice, pasta, or stuffing mixes   Vegetables   Most fresh, frozen, and low-sodium canned vegetables Low-sodium and salt-free vegetable juices Canned vegetables if unsalted or rinsed   Regular canned vegetables and juices, including sauerkraut and pickled vegetables Frozen vegetables with sauces Commercially prepared potato and vegetable mixes   Fruits   Most fresh, frozen, and canned fruits All fruit juices    Fruits processed with salt or sodium   Milk   Nonfat or low-fat (1%) milk Nonfat or low-fat yogurt Cottage cheese, low-fat ricotta, cheeses labeled as low-fat and low-sodium   Whole milk Reduced-fat (2%) milk Malted and chocolate milk Full fat yogurt Most cheeses (unless low-fat and low salt) Buttermilk (no more than 1 cup per week)   Meats and Beans   Lean cuts of fresh or frozen beef, veal, lamb, or pork (look for the word loin) Fresh or frozen poultry without the skin Fresh or frozen fish and some shellfish Egg whites and egg substitutes (Limit whole eggs to three per week) Tofu Nuts or seeds (unsalted, dry-roasted), low-sodium peanut butter Dried peas, beans, and lentils   Any smoked, cured, salted, or canned meat, fish, or poultry (including bacon, chipped beef, cold cuts, hot dogs, sausages, sardines, and anchovies) Poultry skins Breaded and/or fried fish or meats Canned peas, beans, and lentils Salted nuts   Fats and Oils   Olive oil and canola oil Low-sodium, low-fat salad dressings and mayonnaise   Butter, margarine, coconut and palm oils, bacon fat   Snacks, Sweets, and Condiments   Low-sodium or unsalted versions of broths, soups, soy sauce, and condiments Pepper, herbs, and spices; vinegar, lemon, or lime juice Low-fat frozen desserts (yogurt, sherbet, fruit bars) Sugar, cocoa powder, honey, syrup, jam, and preserves Low-fat, trans-fat free cookies, cakes, and pies Graham and animal crackers, fig bars, ginger snaps   High-fat desserts Broth, soups, gravies, and sauces, made from instant mixes or other high-sodium ingredients Salted snack foods Canned olives Meat tenderizers, seasoning salt, and most flavored vinegars   Beverages   Low-sodium carbonated beverages Tea and coffee in moderation Soy milk   Commercially softened water    Suggestions   Make whole grains, fruits, and vegetables the base of your diet.    Choose heart-healthy fats such as canola, olive, and flaxseed oil, and foods high in  heart-healthy fats, such as nuts, seeds, soybeans, tofu, and fish.    Eat fish  at least twice per week; the fish highest in omega-3 fatty acids and lowest in mercury include salmon, herring, mackerel, sardines, and canned chunk light tuna. If you eat fish less than twice per week or have high triglycerides, talk to your doctor about taking fish oil supplements.    Read food labels.   For products low in fat and cholesterol, look for fat free, low-fat, cholesterol free, saturated fat free, and trans fat freeAlso scan the Nutrition Facts Label, which lists saturated fat, trans fat, and cholesterol amounts.   For products low in sodium, look for sodium free, very low sodium, low sodium, no added salt, and unsalted   Skip the salt when cooking or at the table; if food needs more flavor, get creative and try out different herbs and spices. Garlic and onion also add substantial flavor to foods.    Trim any visible fat off meat and poultry before cooking, and drain the fat off after browning.    Use cooking methods that require little or no added fat, such as grilling, boiling, baking, poaching, broiling, roasting, steaming, stir-frying, and sauting.    Avoid fast food and convenience food. They tend to be high in saturated and trans fat and have a lot of added salt.    Talk to a registered dietitian for individualized diet advice.      Last Reviewed: March 2011 Hadassah Clause, MS, MPH, RD   Updated: 09/07/2009       Preventing Osteoporosis: After Your Visit  Your Care Instructions  Osteoporosis means the bones are weak and thin enough that they can break easily. The older you are, the more likely you are to get osteoporosis. But with plenty of calcium, vitamin D , and exercise, you can help prevent osteoporosis.  The preteen and teen years are a key time for bone building. With the help of calcium, vitamin D , and exercise in those early years and beyond, the bones reach their peak density and strength by age 88. After age 20,  your bones naturally start to thin and weaken.  The stronger your bones are at around age 53, the lower your risk for osteoporosis. But no matter what your age and risk are, your bones still need calcium, vitamin D , and exercise to stay strong. Also avoid smoking, and limit alcohol. Smoking and heavy alcohol use can make your bones thinner.  Talk to your doctor about any special risks you might have, such as having a close relative with osteoporosis or taking a medicine that can weaken bones. Your doctor can tell you the best ways to protect your bones from thinning.  Follow-up care is a key part of your treatment and safety. Be sure to make and go to all appointments, and call your doctor if you are having problems. It's also a good idea to know your test results and keep a list of the medicines you take.  How can you care for yourself at home?  Get enough calcium and vitamin D . The Institute of Medicine recommends adults younger than age 17 need 1,000 mg of calcium and 600 IU of vitamin D  each day. Women ages 53 to 75 need 1,200 mg of calcium and 600 IU of vitamin D  each day. Men ages 16 to 74 need 1,000 mg of calcium and 600 IU of vitamin D  each day. Adults 71 and older need 1,200 mg of calcium and 800 IU of vitamin D  each day.  Eat foods rich in calcium, like yogurt, cheese, milk,  and dark green vegetables.  Eat foods rich in vitamin D , like eggs, fatty fish, cereal, and fortified milk.  Get some sunshine. Your body uses sunshine to make its own vitamin D . The safest time to be out in the sun is before 10 a.m. or after 3 p.m. Avoid getting sunburned. Sunburn can increase your risk of skin cancer.  Talk to your doctor about taking a calcium plus vitamin D  supplement. Ask about what type of calcium is right for you, and how much to take at a time. Adults ages 49 to 51 should not get more than 2,500 mg of calcium and 4,000 IU of vitamin D  each day, whether it is from supplements and/or food. Adults ages 38 and  older should not get more than 2,000 mg of calcium and 4,000 IU of vitamin D  each day from supplements and/or food.  Get regular bone-building exercise. Weight-bearing and resistance exercises keep bones healthy by working the muscles and bones against gravity. Start out at an exercise level that feels right for you. Add a little at a time until you can do the following:  Do 30 minutes of weight-bearing exercise on most days of the week. Walking, jogging, stair climbing, and dancing are good choices.  Do resistance exercises with weights or elastic bands 2 to 3 days a week.  Limit alcohol. Drink no more than 1 alcohol drink a day if you are a woman. Drink no more than 2 alcohol drinks a day if you are a man.  Do not smoke. Smoking can make bones thin faster. If you need help quitting, talk to your doctor about stop-smoking programs and medicines. These can increase your chances of quitting for good.  When should you call for help?  Watch closely for changes in your health, and be sure to contact your doctor if:  You need help with a healthy eating plan.  You need help with an exercise plan     2006-2012 [employer]. Care instructions adapted under license by [facility] This care instruction is for use with your licensed healthcare professional. If you have questions about a medical condition or this instruction, always ask your healthcare professional. [employer] disclaims any warranty or liability for your use of this information.  Content Version: 9.4.94723; Last Revised: November 29, 2009                Keeping Home a [place]       As we get older, changes in balance, gait, strength, vision, hearing, and cognition make even the most youthful senior more prone to accidents. Falls are one of the leading health risks for older people. This increased risk of falling is related to:   Aging process (eg, decreased muscle strength, slowed reflexes)   Higher incidence of chronic  health problems (eg, arthritis, diabetes) that may limit mobility, agility or sensory awareness   Side effects of medicine (eg, dizziness, blurred vision)especially medicines like prescription pain medicines and drugs used to treat mental health conditions   Depending on the brittleness of your bones, the consequences of a fall can be serious and long lasting.   Home Life   Research by the Association of Aging [facility]) shows that some home accidents among older adults can be prevented by making simple lifestyle changes and basic modifications and repairs to the home environment. Here are some lifestyle changes that experts recommend:   Have your hearing and vision checked regularly. Be sure to wear prescription glasses that  are right for you.   Speak to your doctor or pharmacist about the possible side effects of your medicines. A number of medicines can cause dizziness.   If you have problems with sleep, talk to your doctor.   Limit your intake of alcohol.   If necessary, use a cane or Raequan Vanschaick to help maintain your balance.   Wear supportive, rubber-soled shoes, even at home. If you live in a region that gets wintry weather, you may want to put special cleats on your shoes to prevent you from slipping on the snow and ice.   Exercise regularly to help maintain muscle tone, agility, and balance.   Always hold the banister when going up or down stairs. Also, use grip bars when getting in or out of the bath or shower, or using the toilet.   To avoid dizziness, get up slowly from a lying down position. Sit up first, dangling your legs for a minute or two before rising to a standing position.   Overall Home Safety Check   According to the Consumer Product Safety Commision's Older Consumer Home Safety Checklist, it is important to check for potential hazards in each room. And remember, proper lighting is an essential factor in home safety. If you cannot see clearly, you are more likely to fall.   Important questions to ask  yourself include:   Are lamp, electric, extension, and telephone cords placed out of the flow of traffic and maintained in good condition? Have frayed cords been replaced?   Are all small rugs and runners slip resistant? If not, you can secure them to the floor with a special double-sided carpet tape.   Are smoke detectors properly locatedone on every floor of your home and one outside of every sleeping area? Are they in good working order? Are batteries replaced at least once a year?   Do you have a well-maintained carbon monoxide detector outside every sleeping are in your home?   Does your furniture layout leave plenty of space to maneuver between and around chairs, tables, beds, and sofas?   Are hallways, stairs and passages between rooms well lit? Can you reach a lamp without getting out of bed?   Are floor surfaces well maintained? Shag rugs, high-pile carpeting, tile floors, and polished wood floors can be particularly slippery. Stairs should always have handrails and be carpeted or fitted with a non-skid tread.   Is your telephone easily reachable. Is the cord safely tucked away?   Room by Room   According to the Association of Aging, bathrooms and kitchens are the two most potentially hazardous rooms in your home.   In the Kitchen    Be sure your stove is in proper working order and always make sure burners and the oven are off before you go out or go to sleep.    Keep pots on the back burners, turn handles away from the front of the stove, and keep stove clean and free of grease build-up.    Kitchen ventilation systems and range exhausts should be working properly.    Keep flammable objects such as towels and pot holders away from the cooking area except when in use. Make sure kitchen curtains are tied back.    Move cords and appliances away from the sink and hot surfaces. If extension cords are needed, install wiring guides so they do not hang over the sink, range, or working areas.    Look for coffee  pots, Radio producer ovens with  automatic shut-offs.    Keep a mop handy in the kitchen so you can wipe up spills instantly. You should also have a small Government social research officer.    Arrange your kitchen with frequently used items on lower shelves to avoid the need to stand on a stepstool to reach them.    Make sure countertops are well-lit to avoid injuries while cutting and preparing food.    In the Bathroom    Use a non-slip mat or decals in the tub and shower, since wet, soapy tile or porcelain surfaces are extremely slippery.    Make sure bathroom rugs are non-skid or tape them firmly to the floor. Bathtubs should have at least one, preferably two, grab bars, firmly attached to structural supports in the wall. (Do not use built-in soap holders or glass shower doors as grab bars.)    Tub seats fitted with non-slip material on the legs allow you to wash sitting down. For people with limited mobility, bathtub transfer benches allow you to slide safely into the tub.    Raised toilet seats and toilet safety rails are helpful for those with knee or hip problems.    In the Bedroom    Make sure you use a nightlight and that the area around your bed is clear of potential obstacles.    Be careful with electric blankets and never go to sleep with a heating pad, which can cause serious burns even if on a low setting.    Use fire-resistant mattress covers and pillows, and NEVER smoke in bed.    Keep a phone next to the bed that is programmed to dial 911 at the push of a button.      If you have a chronic condition, you may want to sign on with an automatic call-in service. Typically the system includes a small pendant that connects directly to an emergency medical voice-response system. You should also make arrangements to stay in contact with someonefriend, neighbor, family memberon a regular schedule.   Fire Prevention   According to the [employer] (Smoke Alarms for Every) Home Foundation, senior citizens are one of  the two highest risk groups for death and serious injuries due to residential fires.   When cooking, wear short-sleeved items, never a bulky long-sleeved robe.    The CPSC's Safety Checklist for Older Consumers emphasizes the importance of checking basements, garages, workshops and storage areas for fire hazards, such as volatile liquids, piles of old rags or clothing and overloaded circuits.    Never smoke in bed or when lying down on a couch or recliner chair.    Small portable electric or kerosene heaters are responsible for many home fires and should be used cautiously if at all. If you do use one, be sure to keep them away from flammable materials.    In case of fire, make sure you have a pre-established emergency exit plan.    Have a professional check your fireplace and other fuel-burning appliances yearly.    Helping Hands   Baby boomers entering the golden years will continue to see the development of new products to help older adults live safely and independently in spite of age-related changes.  Making Life More Livable  , by Leeroy Nancy, lists over 1,000 products for living well in the mature years, such as bathing and mobility aids, household security devices, ergonomically designed knives and peelers, and faucet valves and knobs for temperature control. Medical supply stores and organizations are good sources  of information about products that improve your quality of life and insure your safety.     Last Reviewed: November 2009 Redell Counts, MD   Updated: 08/16/2009         Safer Sex: After Your Visit  Your Care Instructions  Safer sex is a way to reduce your risk of getting an infection spread through sex. It can also help prevent pregnancy. Most infections that are spread through sex, also called sexually transmitted infections or STIs, can be cured. But some can decrease your chances of getting pregnant if they are not treated early. Others, such as herpes, have no cure. And some, such as HIV,  can be deadly.  Several products can help you practice safer sex and reduce your chance of STIs. One of the best is a condom. There are condoms for men and for women. The male condom is a tube of soft plastic with a closed end that is placed deep into the vagina. You can use a special rubber sheet (dental dam) for protection during oral sex. Latex gloves can keep your hands from touching blood, semen, or other body fluids that can carry infections.  Remember that birth control methods such as diaphragms, IUDs, foams, and birth control pills do not stop you from getting STIs.  Follow-up care is a key part of your treatment and safety. Be sure to make and go to all appointments, and call your doctor if you are having problems. It's also a good idea to know your test results and keep a list of the medicines you take.  How can you care for yourself at home?  Think about getting shots to prevent hepatitis A and hepatitis B. These two diseases can be spread through sex. You also can get hepatitis A if you eat infected food.  Use condoms or male condoms each time and every time you have sex.  Learn the right way to use a male condom:  Condoms come in several sizes. Make sure you use the right size. A condom that is too small can break easily. A condom that is too big can slip off during sex. Use a new condom each time you have sex.  Be careful not to poke a hole in the condom when you open the wrapper.  Squeeze the tip of the condom to keep out air.  Pull down the loose skin (foreskin) from the head of an uncircumcised penis.  While squeezing the tip of the condom, unroll it all the way down to the base of the firm penis.  Never use petroleum jelly (such as Vaseline), grease, hand lotion, baby oil, or anything with oil in it. These products can make holes in the condom.  After sex, hold the condom on your penis as you remove your penis from your partner. This will keep semen from spilling out of the condom.  Learn to  use a male condom:  You can put in a male condom up to 8 hours before sex.  Squeeze the smaller ring at the closed end and insert it deep into the vagina. The larger ring at the open end should stay outside the vagina.  During sex, make sure the penis goes into the condom.  After the penis is removed, close the open end of the condom by twisting it. Remove the condom.  Do not use a male condom and male condom at the same time.  Do not have sex with anyone who has symptoms of an STI, such  as sores on the genitals or mouth. The herpes virus that causes cold sores can spread to and from the penis and vagina.  Do not drink a lot of alcohol or use drugs before sex. This can cause you to let down your guard and not practice safer sex.  Having one sex partner (who does not have STIs and does not have sex with anyone else) is a sure way to avoid STIs.  Talk to your partner before you have sex. Find out if he or she has or is at risk for any STI. Keep in mind that a person may be able to spread an STI even if he or she does not have symptoms. You and your partner may want to get an HIV test. You should get tested again 6 months later.     2006-2012 [employer]. Care instructions adapted under license by [facility]. This care instruction is for use with your licensed healthcare professional. If you have questions about a medical condition or this instruction, always ask your healthcare professional. [employer], Inc[employer] disclaims any warranty or liability for your use of this information.  Content Version: 9.4.94723; Last Revised: June 30, 2010                Patient information: Weight loss treatments    INTRODUCTION--Obesity is a major international problem, and Americans are among the heaviest people in the world. The percentage of obese people in the [place]  has risen steadily.  Many people find that although they initially lose weight by dieting, they quickly regain the  weight after the diet ends. Because it so hard to keep weight off over time, it is important to have as much information and support as possible before starting a diet. You are most likely to be successful in losing weight and keeping it off when you believe that your body weight can be controlled.  STARTING A WEIGHT LOSS PROGRAM--Some people like to talk to their doctor or nurse to get help choosing the best plan, monitoring progress, and getting advice and support along the way.  To know what treatment (or combination of treatments) will work best, determine your body mass index (BMI) and waist circumference (measurement). The BMI is calculated from your height and weight.  A person with a BMI between 25 and 29.9 is considered overweight   A person with a BMI of 30 or greater is considered to be obese  A waist circumference greater than 35 inches (88 cm) in women and 40 inches (102 cm) in men increases the risk of obesity-related complications, such as heart disease and diabetes. People who are obese and who have a larger waist size may need more aggressive weight loss treatment than others. Talk to your doctor or nurse for advice.  Types of treatment--Based on your measurements and your medical history, your doctor or nurse can determine what combination of weight loss treatments would work best for you. Treatments may include changes in lifestyle, exercise, dieting, and, in some cases, weight loss medicines or weight loss surgery. Weight loss surgery, also called bariatric surgery, is reserved for people with severe obesity who have not responded to other weight loss treatments.   SETTING A WEIGHT LOSS GOAL--It is important to set a realistic weight loss goal. Your first goal should be to avoid gaining more weight and staying at your current weight (or within 5 percent). Many people have a dream weight that is difficult or impossible to achieve.  People at  high risk of developing diabetes who are able to  lose 5 percent of their body weight and maintain this weight will reduce their risk of developing diabetes by about 50 percent and reduce their blood pressure. This is a success.  Losing more than 15 percent of your body weight and staying at this weight is an extremely good result, even if you never reach your dream or ideal weight.  LIFESTYLE CHANGES--Programs that help you to change your lifestyle are usually run by psychologists or other professionals. The goals of lifestyle changes are to help you change your eating habits, become more active, and be more aware of how much you eat and exercise, helping you to make healthier choices.  This type of treatment can be broken down into three steps:  The triggers that make you want to eat   Eating   What happens after you eat  Triggers to eat--Determining what triggers you to eat involves figuring out what foods you eat and where and when you eat. To figure out what triggers you to eat, keep a record for a few days of everything you eat, the places where you eat, how often you eat, and the emotions you were feeling when you ate.  For some people, the trigger is related to a certain time of day or night. For others, the trigger is related to a certain place, like sitting at a desk working.  Eating--You can change your eating habits by breaking the chain of events between the trigger for eating and eating itself. There are many ways to do this. For instance, you can:  Limit where you eat to a few places (eg, dining room)   Restrict the number of utensils (eg, only a fork) used for eating   Drink a sip of water  between each bite   Chew your food a certain number of times   Get up and stop eating every few minutes  What happens after you eat--Rewarding yourself for good eating behaviors can help you to develop better habits. This is not a reward for weight loss; instead, it is a reward for changing unhealthy behaviors.  Do not use food as a reward. Some people  find money, clothing, or personal care (eg, a hair cut, manicure, or massage) to be effective rewards. Treat yourself immediately after making better eating choices to reinforce the value of the good behavior.  You need to have clear behavior goals, and you must have a time frame for reaching your goals. Reward small changes along the way to your final goal.  Other factors that contribute to successful weight loss--Changing your behavior involves more than just changing unhealthy eating habits; it also involves finding people around you to support your weight loss, reducing stress, and learning to be strong when tempted by food.  Establish a buddy system -- Having a friend or family member available to provide support and reinforce good behavior is very helpful. The support person needs to understand your goals.   Learn to be strong -- Learning to be strong when tempted by food is an important part of losing weight. As an example, you will need to learn how to say no and continue to say no when urged to eat at parties and social gatherings. Develop strategies for events before you go, such as eating before you go or taking low-calorie snacks and drinks with you.   Develop a support system -- Having a support system is helpful when losing weight. This is  why many commercial groups are successful. Family support is also essential; if your family does not support your efforts to lose weight, this can slow your progress or even keep you from losing weight.   Positive thinking -- People often have conversations with themselves in their head; these conversations can be positive or negative. If you eat a piece of cake that was not planned, you may respond by thinking, Oh, you stupid idiot, you've blown your diet! and as a result, you may eat more cake.  A positive thought for the same event could be, Well, I ate cake when it was not on my plan. Now I should do something to get back on track. A positive approach  is much more likely to be successful than a negative one.  Reduce stress -- Although stress is a part of everyday life, it can trigger uncontrolled eating in some people. It is important to find a way to get through these difficult times without eating or by eating low-calorie food, like raw vegetables. It may be helpful to imagine a relaxing place that allows you to temporarily escape from stress. With deep breaths and closed eyes, you can imagine this relaxing place for a few minutes.   Self-help programs -- Self-help programs like [employer], Overeaters Anonymous, and [employer] (TOPS) work for some people. As with all weight loss programs, you are most likely to be successful with these plans if you make long-term changes in how you eat.  CHOOSING A DIET--A calorie is a unit of energy found in food. Your body needs calories to function. The goal of any diet is to burn up more calories than you eat.   How quickly you lose weight depends upon several factors, such as your age, gender, and starting weight.  Older people have a slower metabolism than young people, so they lose weight more slowly.   Men lose more weight than women of similar height and weight when dieting because they use more energy.   People who are extremely overweight lose weight more quickly than those who are only mildly overweight.  Try not to drink alcohol or drinks with added sugar, and most sweets (candy, cakes, cookies), since they rarely contain important nutrients.  Portion-controlled diets--One simple way to diet is to buy packaged foods, like frozen low-calorie meals or meal-replacement canned drinks. A typical meal plan for one day may include:  A meal-replacement drink or breakfast bar for breakfast   A meal-replacement drink or a frozen low-calorie (250 to 350 calories) meal for lunch   A frozen low-calorie meal or other prepackaged, calorie-controlled meal, along with extra vegetables for dinner  This  would give you 1000 to 1500 calories per day.  Low-fat diet--To reduce the amount of fat in your diet, you can:  Eat low-fat foods. Low-fat foods are those that contain less than 30 percent of calories from fat. Fat is listed on the food facts label (figure 1).   Count fat grams. For a 1500 calorie diet, this would mean about 45 g or fewer of fat per day.  Low-carbohydrate diet--Low- and very-low-carbohydrate diets (eg, Atkins diet, [employer]) have become popular ways to lose weight quickly.  With a very-low-carbohydrate diet, you eat between 0 and 60 grams of carbohydrates per day (a standard diet contains 200 to 300 grams of carbohydrates)   With a low-carbohydrate diet, you eat between 60 and 130 grams of carbohydrates per day  Carbohydrates are found in  fruits, vegetables, and grains (including breads, rice, pasta, and cereal), alcoholic beverages, and in dairy products. Meat and fish do not contain carbohydrates.  Side effects of very-low-carbohydrate diets can include constipation, headache, bad breath, muscle cramps, diarrhea, and weakness.  Mediterranean diet--The term Mediterranean diet refers to a way of eating that is common in olive-growing regions around the [employer]. Although there is some variation in Mediterranean diets, there are some similarities. Most Mediterranean diets include:  A high level of monounsaturated fats (from olive or canola oil, walnuts, pecans, almonds) and a low level of saturated fats (from butter)   A high amount of vegetables, fruits, legumes, and grains (7 to 10 servings of fruits and vegetables per day)   A moderate amount of milk and dairy products, mostly in the form of cheese. Use low-fat dairy products (skim milk, fat-free yogurt, low-fat cheese).   A relatively low amount of red meat and meat products. Substitute fish or poultry for red meat.   For those who drink alcohol, a modest amount (mainly as red wine) may help to protect against  cardiovascular disease. A modest amount is up to one (4 ounce) glass per day for women and up to two glasses per day for men.  Which diet is best?--Studies have compared different diets, including:  Very-low-carbohydrate (AtkinsT)   Macronutrient balance controlling glycemic load (Zone)   Reduced-calorie (Weight Watchers)   Very-low-fat (Ornish)  No one diet is best for weight loss. Any diet will help you to lose weight if you stick with the diet. Therefore, it is important to choose a diet that includes foods you like.  Fad diets--Fad diets often promise quick weight loss (more than 1 to 2 pounds per week) and may claim that you do not need to exercise or give up favorite foods. Some fad diets cost a lot of money, because you have to pay for seminars or pills. Fad diets generally lack any scientific evidence that they are safe and effective, but instead rely on before and after photos or testimonials.  Diets that sound too good to be true usually are. These plans are a waste of time and money and are not recommended. A doctor, nurse, or nutritionist can help you find a safe and effective way to lose weight and keep it off.  WEIGHT LOSS MEDICINES--Taking a weight loss medicine may be helpful when used in combination with diet, exercise, and lifestyle changes. However, it is important to understand the risks and benefits of these medicines. It is also important to be realistic about your goal weight using a weight loss medicine; you may not reach your dream weight, but you may be able to reduce your risk of diabetes or heart disease.   Weight loss medicines may be recommended for people who have not been able to lose weight with diet and exercise who have a:  BMI of 30 or more    BMI between 27 and 29.9 and have other medical problems, such as diabetes, high cholesterol, or high blood pressure  Two weight loss medicines are approved in the [place]  for long-term use. These are sibutramineand  orlistat.  Other weight loss medicines (phentermine, diethylpropion) are available but are only approved for short-term use (up to 12 weeks).  Sibutramine--Sibutramine(Meridia, Reductil) is a medicine that reduces your appetite. In people who take the medicine for one year, the average weight loss is 10 percent of the initial body weight (5 percent more than those who took a placebo treatment).  Side effects of sibutramineinclude insomnia, dry mouth, and constipation. Increases in blood pressure can occur. Therefore, blood pressure is usually monitored during treatment. There is no evidence that sibutramine causes heart or lung problems (like dexfenfluramine and fenfluramine (Phen/Fen)). However, experts agree that sibutramine should not used by people with coronary heart disease, heart failure, uncontrolled hypertension, stroke, irregular heart rhythms, or peripheral vascular disease (poor circulation in the legs).  Orlistat--Orlistat(Xenical 120 mg capsules) is a medicine that reduces the amount of fat your body absorbs from the foods you eat. A lower-dose version is now available without a prescription (Alli 60 mg capsules) in many countries, including the [place] . The medicine is recommended three times per day, taken with a meal; you can skip a dose if you skip a meal or if the meal contains no fat.  After one year of treatment with orlistat, the average weight loss is approximately 8 to 10 percent of initial body weight (4 percent more than in those who took a placebo). Cholesterol levels often improve, and blood pressure sometimes falls. In people with diabetes, orlistat may help control blood sugar levels.  Side effects occur in 15 to 10 percent of people and may include stomach cramps, gas, diarrhea, leakage of stool, or oily stools. These problems are more likely when you take orlistatwith a high-fat meal (if more than 30 percent of calories in the meal are from fat). Side effects usually  improve as you learn to avoid high-fat foods. Severe liver injury has been reported rarely in patients taking orlistat, but it is not known if orlistat caused the liver problems.  Diet supplements--Diet supplements are widely used by people who are trying to lose weight, although the safety and efficacy of these supplements are often unproven. A few of the more common diet supplements are discussed below; none of these are recommended because they have not been studied carefully, and there is no proof they are safe or effective.  Chitosanand wheat dextrin are ineffective for weight loss, and their use is not recommended.   Ephedra, a compound related to ephedrine , is no longer available in the [place]  due to safety concerns. Many nonprescription diet pills previously contained ephedra. Although some studies have shown that ephedra helps with weight loss, there can be serious side effects (psychiatric symptoms, palpitations, and stomach upset), including death.   There are not enough data about the safety and efficacy of chromium, ginseng, glucomannan, green tea, hydroxycitric acid, L carnitine, psyllium, pyruvate supplements, St. Johns wort, and conjugated linoleic acid.   Two supplements from [place], Emagrece Sim (also known as the [place] diet pill) and Herbathin dietary supplement, have been shown to contain prescription drugs.   Hoodia gordonii is a dietary supplement derived from a plant in [place]. It is not recommended because there is no proof that it is safe or effective.   Bitter orange (Citrus aurantium) can increase your heart rate and blood pressure and is not recommended.  SHOULD I HAVE SURGERY TO LOSE WEIGHT? -- Weight loss surgery is recommended ONLY for people with one of the following:  Severe obesity (body mass index above 40) (calculator 1 and calculator 2) who have not responded to diet, exercise, or weight loss medicines   Body mass index between 35 and 40, along with a serious  medical problem (including diabetes, severe joint pain, or sleep apnea) that would improve with weight loss  You should be sure that you understand the potential risks and benefits of weight loss  surgery. You must be motivated and willing to make lifelong changes in how you eat to reach and maintain a healthier weight after surgery. You must also be realistic about weight loss after surgery (see 'Effectiveness of weight loss surgery' below).  PREPARING FOR WEIGHT LOSS SURGERY -- Most people who have weight loss surgery will meet with several specialists before surgery is scheduled. This often includes a dietitian, mental health counselor, a doctor who specializes in care of obese people, and a surgeon who performs weight loss surgery (bariatric surgeon). You may need to work with these providers for several weeks or months before surgery.  The nutritionist will explain what and how much you will be able to eat after surgery. You may also need to lose a small amount of weight before surgery.   The mental health specialist will help you to cope with stress and other factors that can make it harder to lose weight or trigger you to eat   The medical doctor will determine whether you need other tests, counseling, or treatment before surgery. He or she might also help you begin a medical weight loss program so that you can lose some weight before surgery.   The bariatric surgeon will meet with you to discuss the surgeries available to treat obesity. He or she will also make sure you are a good candidate for surgery.   TYPES OF WEIGHT LOSS SURGERY -- There are several types of weight loss surgeries, the most common being lap banding, gastric bypass, and gastric sleeve.  Lap banding -- Laparoscopic adjustable gastric banding (LAGB), or lap banding, is a surgery that uses an adjustable band around the opening to the stomach (figure 1). This reduces the amount of food that you can eat at one time.  Lap banding is done through  small incisions, with a laparoscope. The band can be adjusted after surgery, allowing you to eat more or less food. Adjustments to the size and tightness of the band are made by using a needle to add or remove fluid from a port (a small container under the skin that is connected to the band). Adding fluid to the band makes it tighter which restricts the amount of food you can eat and may help you to lose more weight.  Lap banding is a popular choice because it is relatively simple to perform, can be adjusted or removed, and has a low risk of serious complications immediately after surgery. However, weight loss with the lap band depends on your ability to follow the program closely.  You will need to prepare nutritious meals that work with the band, not against it. For example, the lap band will not work well if you eat or drink a large amount of liquid calories (like ice cream). The band will not help you to feel full when you eat/drink liquid calories.  Weight loss ranges from 45 to 75 percent after two years. As an example, a person who is 120 pounds overweight could expect to lose approximately 54 to 90 pounds in the two years after lap banding.  Gastric bypass -- Roux-en-Y gastric bypass, also called gastric bypass, helps you to lose weight by reducing the amount of food you can eat and reducing the number of calories and nutrients you absorb from the food you eat.  To perform gastric bypass, a surgeon creates a small stomach pouch by dividing the stomach and attaching it to the small intestine. This helps you to lose weight in two ways:  The smaller stomach can hold less food than before surgery. This causes you to feel full after eating a very small amount of food or liquid. Over time, the pouch might stretch, allowing you to eat more food.   The body absorbs fewer calories, since food bypasses most of the stomach as well as the upper small intestine. This new arrangement seems to decrease your appetite and  change how you break down foods by changing the release of various hormones.  Gastric bypass can be performed as open surgery (through an incision on the abdomen) or laparoscopically, which uses smaller incisions and smaller instruments. Both the laparoscopic and open techniques have risks and benefits. You and your surgeon should work together to decide which surgery, if any, is right for you.  Gastric bypass has a high success rate, and people lose an average of 62 to 68 percent of their excess body weight in the first year. Weight loss typically levels off after one to two years, with an overall excess weight loss between 50 and 75 percent. For a person who is 120 pounds overweight, an average of 60 to 90 pounds of weight loss would be expected.  Gastric sleeve -- Gastric sleeve, also known as sleeve gastrectomy, is a surgery that reduces the size of the stomach and makes it into a narrow tube (figure 3). The new stomach is much smaller and produces less of the hormone (ghrelin) that causes hunger, helping you feel satisfied with less food.  Sleeve gastrectomy is safer than gastric bypass because the intestines are not rearranged, and there is less chance of malnutrition. It also appears to control hunger better than lap banding. It might be safer than the lap banding because no foreign materials are used.  The gastric sleeve has a good success rate, and people lose an average of 33 percent of their excess body weight in the first year. For a person who is 120 pounds overweight, this would mean losing about 40 pounds in the first year.  WEIGHT LOSS SURGERY COMPLICATIONS -- A variety of complications can occur with weight loss surgery. The risks of surgery depend upon which surgery you have and any medical problems you had before surgery. Some of the more common early surgical complications (one to six weeks after surgery) include:  Bleeding   Infection   Blockage or tear in the bowels   Need for further  surgery  Important medical complications after surgery can include blood clots in the legs or lungs, heart attack, pneumonia, and urinary tract infection.   Complications are less likely when surgery is performed in centers that are experienced in weight loss surgery. In general, centers with experience in weight loss surgery have:  Board-certified doctors and surgeons   A team of support staff (dietitians, counselors, nurses)   Long-term follow-up after surgery   Hospital staff experienced with the care of weight loss patients. This includes nurses who are trained in the care of patients immediately after surgery and anesthesiologists who are experienced in caring for the morbidly obese.  EFFECTIVENESS OF WEIGHT LOSS SURGERY -- The goal of weight loss surgery is to reduce the risk of illness or death associated with obesity. Weight loss surgery can also help you to feel and look better, reduce the amount of money you spend on medicines, and cut down on sick days.   As an example, weight loss surgery can improve health problems related to obesity (diabetes, high blood pressure, high cholesterol, sleep apnea) to the  point that you need less or no medicine.  Finally, weight loss surgery might reduce your risk of developing heart disease, cancer, and certain infections.  AFTER WEIGHT LOSS SURGERY -- You will need to stay in the hospital until your team feels that it is safe for you to leave (on average, one to three days). Do not drive if you are taking prescription pain medicine. Begin exercising as soon as possible once you have healed; most weight loss centers will design an exercise program for you.  Once you are home, it is important to eat and drink exactly what your doctor and dietitian recommend. You will see your doctor, nurse, and dietitian on a regular basis after surgery to monitor your health, diet, and weight loss.   You will be able to slowly increase how much you eat over time, although it will always  be important to:  Eat small, frequent meals and not skip meals   Chew your food slowly and completely   Avoid eating while distracted (such as eating while watching TV)   Stop eating when you feel full   Drink liquids at least 30 minutes before or after eating   Avoid foods high in fat or sugar   Take vitamin supplements, as recommended  It can take several months to learn to listen to your body so that you know when you are hungry and when you are full. You may dislike foods you previously loved, and you may begin to prefer new foods. This can be a frustrating process for some people, so talk to your dietitian if you are having trouble.  It usually takes between one and two years to lose weight after surgery. After reaching their goal weight, some people have plastic surgery (called body contouring) to remove excess skin from the body, particularly in the abdominal area.  Before you decide to have weight loss surgery, you must commit to staying healthy for life. This includes following up with your healthcare team, exercising most days of the week, and eating a sensible diet every day. It can be difficult to develop new eating and exercise habits after weight loss surgery, and you will have to work hard to stick to your goals.  Recovering from surgery and losing weight can be stressful and emotional, and it is important to have the support of family and friends. Working with a Child psychotherapist, therapist, or support group can help you through the ups and downs.  WHERE TO GET MORE INFORMATION--Your healthcare provider is the best source of information for questions and concerns related to your medical problem.  This article will be updated as needed every four months on our Web site ([facility])

## 2024-03-03 NOTE — Assessment & Plan Note (Signed)
stable on Protonix 40 mg daily.

## 2024-03-03 NOTE — Assessment & Plan Note (Signed)
 stable on levothyroxine 75 mcg

## 2024-03-19 ENCOUNTER — Encounter

## 2024-03-19 MED ORDER — AMLODIPINE BESYLATE 10 MG PO TABS
10 | ORAL_TABLET | Freq: Every day | ORAL | 11 refills | Status: AC
Start: 2024-03-19 — End: ?

## 2024-03-19 MED ORDER — MAGNESIUM GLUCONATE 500 MG PO TABS
500 | ORAL_TABLET | Freq: Every day | ORAL | 5 refills | Status: AC
Start: 2024-03-19 — End: ?

## 2024-03-19 MED ORDER — MECLIZINE HCL 12.5 MG PO TABS
12.5 | ORAL_TABLET | Freq: Three times a day (TID) | ORAL | 3 refills | Status: AC | PRN
Start: 2024-03-19 — End: ?

## 2024-03-19 NOTE — Telephone Encounter (Signed)
"  Refills too soon.   "

## 2024-03-19 NOTE — Telephone Encounter (Signed)
"  Recent Visits  Date Type Provider Dept   03/03/24 Office Visit Vannie Cara KIDD, MD [facility]   01/22/24 Office Visit Vannie Cara KIDD, MD [facility]   12/25/23 Office Visit Vannie Cara KIDD, MD [facility]   09/25/23 Office Visit Vannie Cara KIDD, MD [facility]   07/10/23 Office Visit Vannie Cara KIDD, MD [facility]   03/01/23 Office Visit Vannie Cara KIDD, MD [facility]   02/22/23 Office Visit Vannie Cara KIDD, MD [facility]   Showing recent visits within past 540 days with a meds authorizing provider and meeting all other requirements  Future Appointments  Date Type Provider Dept   06/02/24 Appointment Vannie Cara KIDD, MD [facility]   Showing future appointments within next 150 days with a meds authorizing provider and meeting all other requirements     03/03/2024    "

## 2024-03-19 NOTE — Telephone Encounter (Signed)
"  Recent Visits  Date Type Provider Dept   03/03/24 Office Visit Vannie Cara KIDD, MD [facility]   01/22/24 Office Visit Vannie Cara KIDD, MD [facility]   12/25/23 Office Visit Vannie Cara KIDD, MD [facility]   09/25/23 Office Visit Vannie Cara KIDD, MD [facility]   07/10/23 Office Visit Vannie Cara KIDD, MD [facility]   03/01/23 Office Visit Vannie Cara KIDD, MD [facility]   02/22/23 Office Visit Vannie Cara KIDD, MD [facility]   Showing recent visits within past 540 days with a meds authorizing provider and meeting all other requirements  Future Appointments  Date Type Provider Dept   06/02/24 Appointment Vannie Cara KIDD, MD [facility]   Showing future appointments within next 150 days with a meds authorizing provider and meeting all other requirements     03/03/2024     Different pharmacy  "

## 2024-04-06 ENCOUNTER — Encounter

## 2024-04-07 MED ORDER — CELECOXIB 200 MG PO CAPS
200 | ORAL_CAPSULE | Freq: Two times a day (BID) | ORAL | 3 refills | 50.00000 days | Status: DC
Start: 2024-04-07 — End: 2024-07-10

## 2024-04-07 NOTE — Telephone Encounter (Signed)
"  Recent Visits  Date Type Provider Dept   03/03/24 Office Visit Vannie Cara KIDD, MD [facility]   01/22/24 Office Visit Vannie Cara KIDD, MD [facility]   12/25/23 Office Visit Vannie Cara KIDD, MD [facility]   09/25/23 Office Visit Vannie Cara KIDD, MD [facility]   07/10/23 Office Visit Vannie Cara KIDD, MD [facility]   03/01/23 Office Visit Vannie Cara KIDD, MD [facility]   02/22/23 Office Visit Vannie Cara KIDD, MD [facility]   Showing recent visits within past 540 days with a meds authorizing provider and meeting all other requirements  Future Appointments  Date Type Provider Dept   06/02/24 Appointment Vannie Cara KIDD, MD [facility]   Showing future appointments within next 150 days with a meds authorizing provider and meeting all other requirements     03/03/2024    "

## 2024-04-14 ENCOUNTER — Encounter

## 2024-04-14 MED ORDER — TRAZODONE HCL 50 MG PO TABS
50 | ORAL_TABLET | ORAL | 5 refills | 30.00000 days | Status: AC
Start: 2024-04-14 — End: ?

## 2024-04-14 NOTE — Telephone Encounter (Signed)
"  Dr. Vannie, got faxed request for refill on pt magnesium  from [facility] on file.  They stated pt is taking magnesium  gluconate 500 mg twice daily, but chart says once daily.  Please confirm if should take once or twice daily.  Thanks.  "

## 2024-04-14 NOTE — Telephone Encounter (Signed)
 Take once daily

## 2024-04-15 NOTE — Telephone Encounter (Signed)
"  Spoke with pharmacy, they have the updated rx for once daily on file and will cancel the other order.  "

## 2024-04-21 ENCOUNTER — Encounter

## 2024-04-21 MED ORDER — PRAVASTATIN SODIUM 40 MG PO TABS
40 | ORAL_TABLET | ORAL | 11 refills | 90.00000 days | Status: AC
Start: 2024-04-21 — End: ?

## 2024-04-21 NOTE — Telephone Encounter (Signed)
"  LastVisit 03/03/2024   LastLabs  None  NextVisit Visit date not found   LastRefilled 02/26/2024  Pharmacy:    [facility] [id number] - [place], [place] - [address] - [place] [phone] [address] [place] [id number]  [address] [place] [address]  Phone: [phone] Fax: [phone]     pharmacy confirmed in EPIC   "

## 2024-04-24 NOTE — Telephone Encounter (Signed)
 Completed.

## 2024-05-19 ENCOUNTER — Ambulatory Visit
Admit: 2024-05-19 | Discharge: 2024-05-19 | Payer: Medicare (Managed Care) | Attending: Internal Medicine | Primary: Internal Medicine

## 2024-05-19 VITALS — BP 100/62 | HR 54 | Wt 149.0 lb

## 2024-05-19 DIAGNOSIS — Z09 Encounter for follow-up examination after completed treatment for conditions other than malignant neoplasm: Principal | ICD-10-CM

## 2024-05-19 MED ORDER — MULTIVITAMIN ADULTS PO TABS
ORAL_TABLET | Freq: Every day | ORAL | 3 refills | Status: AC
Start: 2024-05-19 — End: ?

## 2024-05-19 NOTE — Assessment & Plan Note (Signed)
 A1C:   Lab Results   Component Value Date/Time    LABA1C 6.8 01/22/2024 01:51 PM    LABA1C 6.1 02/22/2023 02:38 PM     Increase A1c likely from increased prescribing of steroids for COPD exacerbation  We will prescribe metformin  to help lower A1c.  Will titrate dose accordingly.  Prescribed glucometer and test strips  Patient will return in 1 week for Medicare AWV and we will further discuss his diagnosis  Counselled on Diet and exercise with goal to achieve appropriate BMI  Continue Diabetic medication as documented in medication list  Continue home foot care  Patient agreed with plan with verbal understanding

## 2024-05-19 NOTE — Progress Notes (Signed)
 " Post-Discharge Transitional Care  Follow Up      Michael Knight   Date of Birth: 1955/10/09    Date of Office Visit: 05/19/2024  Admit Date: 05/14/2024   Discharge Date: 05/15/2024   Risk of hospital readmission (high >=14%. Medium >=10%): Readmission Risk Score: 11.7      Care management risk score Rising risk (score 2-5) and Complex Care (Scores >=6): No Risk Score On File     Non face to face following discharge, date last encounter closed (first attempt may have been earlier): *No documented post hospital discharge outreach found in the last 14 days    Call initiated 2 business days of discharge: *No response recorded in the last 14 days    ASSESSMENT/PLAN:   Hospital discharge follow-up  -     PR DISCHARGE MEDS RECONCILED W/ CURRENT OUTPATIENT MED LIST  Type 2 diabetes mellitus without complication, without long-term current use of insulin (HCC)  Assessment & Plan:   A1C:   Lab Results   Component Value Date/Time    LABA1C 6.8 01/22/2024 01:51 PM    LABA1C 6.1 02/22/2023 02:38 PM     Increase A1c likely from increased prescribing of steroids for COPD exacerbation  We will prescribe metformin  to help lower A1c.  Will titrate dose accordingly.  Prescribed glucometer and test strips  Patient will return in 1 week for Medicare AWV and we will further discuss his diagnosis  Counselled on Diet and exercise with goal to achieve appropriate BMI  Continue Diabetic medication as documented in medication list  Continue home foot care  Patient agreed with plan with verbal understanding      Orders:  -     Multiple Vitamins-Minerals (MULTIVITAMIN ADULTS) TABS; Take 1 tablet by mouth daily (with breakfast), Disp-90 tablet, R-3Normal  Essential hypertension  Assessment & Plan:   Stable  Continue anti-hypertensive medication as documented in your medication list amlodipine  10 mg daily  To verify blood pressure cuff accuracy  To keep outpatient BP log,  counseled on exercise and diet (including DASH diet)  Goal to achieve appropriate  BMI  Patient agreed with plan with verbal understanding   Orders:  -     Multiple Vitamins-Minerals (MULTIVITAMIN ADULTS) TABS; Take 1 tablet by mouth daily (with breakfast), Disp-90 tablet, R-3Normal  Acquired hypothyroidism  Assessment & Plan:    stable on levothyroxine  75 mcg       Migraine with aura and without status migrainosus, not intractable  Assessment & Plan:    stable on Ubrelvy  and Topamax .  Patient also takes magnesium  supplement.  Referred to new neurologist  Screening for malignant neoplasm of colon  -     Fecal DNA Colorectal cancer screening (Cologuard)    Medical Decision Making: low complexity  Return in about 2 months (around 07/20/2024) for Medicare AWV.    On this date 05/19/2024 I have spent 30 minutes reviewing previous notes, test results and face to face with the patient discussing the diagnosis and importance of compliance with the treatment plan as well as documenting on the day of the visit.       SUBJECTIVE:   HPI: Follow up of Hospital problems/diagnosis(es): Chest pain     Inpatient course: Discharge summary reviewed- see chart.    Interval history/Current status: 68 year old male with a history of diabetes was seen at [facility] health ED for chest pain.  Patient had cardiac rule out negative EKG and troponins, cardiology was consulted.  Patient was discharged home in  stable condition.    Today patient comes in for follow-up he denies any other acute issues.    Patient Active Problem List   Diagnosis    Chest pain    Chronic cough    Generalized abdominal pain    Latent tuberculosis    Syncope and collapse    Essential hypertension    Dyslipidemia    Gastroesophageal reflux disease    Acquired hypothyroidism    Migraine with aura and without status migrainosus, not intractable    Prediabetes    Difficulty walking    Right foot pain    Nocturia    Mixed incontinence    Pulmonary emphysema (HCC)    COPD exacerbation (HCC)    Asthmatic bronchitis    Gastroparesis    Type 2 diabetes mellitus  without complication, without long-term current use of insulin (HCC)    Acute right ankle pain       Medications listed as ordered at the time of discharge from hospital     Medication List            Accurate as of May 19, 2024  3:34 PM. If you have any questions, ask your nurse or doctor.                START taking these medications      Multivitamin Adults Tabs  Take 1 tablet by mouth daily (with breakfast)  Started by: Dr. Cara Finder, MD            CONTINUE taking these medications      Accu-Chek Guide Me w/Device Kit  USE TO TEST BLOOD GLUCOSE ONCE DAILY. ALTERNATE BETWEEN MORNING FASTING BLOOD SUGAR AND BEDTIME (2 HOURS AFTER EATING).     albuterol  sulfate HFA 108 (90 Base) MCG/ACT inhaler  Commonly known as: PROVENTIL ;VENTOLIN ;PROAIR   Inhale 2 puffs into the lungs 4 times daily as needed for Wheezing     amLODIPine  10 MG tablet  Commonly known as: NORVASC   Take 1 tablet by mouth daily     * benzonatate  100 MG capsule  Commonly known as: TESSALON      * benzonatate  200 MG capsule  Commonly known as: TESSALON   TAKE 1 CAPSULE BY MOUTH 3 TIMES A DAY AS NEEDED FOR COUGH     blood glucose test strips  by Other route daily Test 1 time a day. Alternate between morning fasting blood sugar,  and bedtime (2 hours after eating).     Cane Misc  1 each by Does not apply route daily     celecoxib  200 MG capsule  Commonly known as: CELEBREX   TAKE 1 CAPSULE BY MOUTH 2 TIMES A DAY     Depend Adjustable Underwear Misc  1 each by Does not apply route in the morning, at noon, and at bedtime     fluticasone -salmeterol 250-50 MCG/ACT Aepb diskus inhaler  Commonly known as: Advair  Diskus  Inhale 1 puff into the lungs in the morning and 1 puff in the evening.     Lancets Misc  1 each by Does not apply route daily Test 1 time a day. Alternate between morning fasting blood sugar,  and bedtime (2 hours after eating).     * levothyroxine  25 MCG tablet  Commonly known as: SYNTHROID      * levothyroxine  75 MCG tablet  Commonly known  as: SYNTHROID   Take 1 tablet by mouth Daily     magnesium  gluconate 500 MG tablet  Commonly known as: MAGONATE  Take 1  tablet by mouth daily     meclizine  12.5 MG tablet  Commonly known as: ANTIVERT   Take 1 tablet by mouth 3 times daily as needed for Dizziness     metFORMIN  750 MG extended release tablet  Commonly known as: GLUCOPHAGE -XR  Take 1 tablet by mouth daily (with breakfast)     metoclopramide  5 MG tablet  Commonly known as: Reglan   Take 1 tablet by mouth 3 times daily     pantoprazole  40 MG tablet  Commonly known as: PROTONIX   Take 1 tablet by mouth in the morning and at bedtime     pravastatin  40 MG tablet  Commonly known as: PRAVACHOL   TAKE 1 TABLET BY MOUTH ONCE NIGHTLY     propranolol  40 MG immediate release tablet  Commonly known as: INDERAL   TAKE 1 TABLET BY MOUTH EVERY DAY WITH BREAKFAST     simethicone  80 MG chewable tablet  Commonly known as: MYLICON     tamsulosin  0.4 MG capsule  Commonly known as: FLOMAX      topiramate  100 MG tablet  Commonly known as: TOPAMAX   Take 1 tablet by mouth nightly     traZODone  50 MG tablet  Commonly known as: DESYREL   TAKE 1.5 TABLET BY MOUTH ONCE NIGHTLY     Ubrelvy  100 MG Tabs  Generic drug: Ubrogepant   Take 100 mg by mouth daily as needed (migraine headaches)           * This list has 4 medication(s) that are the same as other medications prescribed for you. Read the directions carefully, and ask your doctor or other care provider to review them with you.                   Where to Get Your Medications        These medications were sent to [facility] [id number] [facility], [facility] - [address] - P [phone] [place] [phone]  [address] [place] [address]      Phone: [phone]   Multivitamin Adults Tabs           Medications marked taking at this time  Outpatient Medications Marked as Taking for the 05/19/24 encounter (Office Visit) with Vannie Cara KIDD, MD   Medication Sig Dispense Refill    simethicone  (MYLICON) 80 MG chewable tablet  Take 1 tablet by mouth every 6 hours as needed      Multiple Vitamins-Minerals (MULTIVITAMIN ADULTS) TABS Take 1 tablet by mouth daily (with breakfast) 90 tablet 3    pravastatin  (PRAVACHOL ) 40 MG tablet TAKE 1 TABLET BY MOUTH ONCE NIGHTLY 30 tablet 11    traZODone  (DESYREL ) 50 MG tablet TAKE 1.5 TABLET BY MOUTH ONCE NIGHTLY 45 tablet 5    celecoxib  (CELEBREX ) 200 MG capsule TAKE 1 CAPSULE BY MOUTH 2 TIMES A DAY 60 capsule 3    meclizine  (ANTIVERT ) 12.5 MG tablet Take 1 tablet by mouth 3 times daily as needed for Dizziness 90 tablet 3    amLODIPine  (NORVASC ) 10 MG tablet Take 1 tablet by mouth daily 30 tablet 11    magnesium  gluconate (MAGONATE) 500 MG tablet Take 1 tablet by mouth daily 30 tablet 5    benzonatate  (TESSALON ) 200 MG capsule TAKE 1 CAPSULE BY MOUTH 3 TIMES A DAY AS NEEDED FOR COUGH 30 capsule 0    Blood Glucose Monitoring Suppl (ACCU-CHEK GUIDE ME) w/Device KIT USE TO TEST BLOOD GLUCOSE ONCE DAILY. ALTERNATE BETWEEN MORNING FASTING BLOOD SUGAR AND BEDTIME (2 HOURS  AFTER EATING). 1 kit 0    topiramate  (TOPAMAX ) 100 MG tablet Take 1 tablet by mouth nightly 60 tablet 5    levothyroxine  (SYNTHROID ) 75 MCG tablet Take 1 tablet by mouth Daily 30 tablet 11    fluticasone -salmeterol (ADVAIR  DISKUS) 250-50 MCG/ACT AEPB diskus inhaler Inhale 1 puff into the lungs in the morning and 1 puff in the evening. 60 each 3    albuterol  sulfate HFA (PROVENTIL ;VENTOLIN ;PROAIR ) 108 (90 Base) MCG/ACT inhaler Inhale 2 puffs into the lungs 4 times daily as needed for Wheezing 18 g 3    metoclopramide  (REGLAN ) 5 MG tablet Take 1 tablet by mouth 3 times daily 90 tablet 1    Incontinence Supply Disposable (DEPEND ADJUSTABLE UNDERWEAR) MISC 1 each by Does not apply route in the morning, at noon, and at bedtime 90 each 5    benzonatate  (TESSALON ) 100 MG capsule Take 1 capsule by mouth Three times daily as needed      levothyroxine  (SYNTHROID ) 25 MCG tablet Take 1 tablet by mouth Daily      metFORMIN  (GLUCOPHAGE -XR) 750 MG extended  release tablet Take 1 tablet by mouth daily (with breakfast) 30 tablet 3    blood glucose monitor strips by Other route daily Test 1 time a day. Alternate between morning fasting blood sugar,  and bedtime (2 hours after eating). 50 strip 5    Lancets MISC 1 each by Does not apply route daily Test 1 time a day. Alternate between morning fasting blood sugar,  and bedtime (2 hours after eating). 300 each 5    pantoprazole  (PROTONIX ) 40 MG tablet Take 1 tablet by mouth in the morning and at bedtime 60 tablet 0    tamsulosin  (FLOMAX ) 0.4 MG capsule Take 1 capsule by mouth nightly      Ubrogepant  (UBRELVY ) 100 MG TABS Take 100 mg by mouth daily as needed (migraine headaches) 16 tablet 5    Misc. Devices (CANE) MISC 1 each by Does not apply route daily 1 each 0    propranolol  (INDERAL ) 40 MG tablet TAKE 1 TABLET BY MOUTH EVERY DAY WITH BREAKFAST 30 tablet 11        Medications patient taking as of now reconciled against medications ordered at time of hospital discharge: Yes    A comprehensive review of systems was negative except for what was noted in the HPI.    OBJECTIVE:         Vitals:    05/19/24 1516   BP: 100/62   BP Site: Left Upper Arm   Patient Position: Sitting   BP Cuff Size: Medium Adult   Pulse: 54   SpO2: 92%   Weight: 67.6 kg (149 lb)      Body mass index is 28.53 kg/m.       Patient in no acute respiratory distress or painful distress.  Normal appearance, not ill looking    Normocephalic, PERRL, EOM movements intact, no nystagmus, bi-lateral external auditory canal and tympanic membranes WNL, normal oro-pharynx.    Mucous membrnes moist and pink. No scleral icterus    Vesicular BS, no wheeze ronchi, crackles or other adventitious sounds    S1S2 regular rate and rhythm, no murmur gallops or rubs    Soft depressible not tender on palpation, no guarding or rebound, no signs of ascites    No signs of edema to legs or feet/    Alert and Oriented to time, place and person. No focal signs or neurological  deficits. Muscle power grade 5/5  in major muscle groups of all 4 limbs. Cranial nerves III-XII intact and WNL.       An electronic signature was used to authenticate this note.  --Earnest Mcgillis O. Vannie, MD        "

## 2024-05-19 NOTE — Assessment & Plan Note (Signed)
"    stable on Ubrelvy  and Topamax .  Patient also takes magnesium  supplement.  Referred to new neurologist  "

## 2024-05-19 NOTE — Assessment & Plan Note (Signed)
"   Stable  Continue anti-hypertensive medication as documented in your medication list amlodipine  10 mg daily  To verify blood pressure cuff accuracy  To keep outpatient BP log,  counseled on exercise and diet (including DASH diet)  Goal to achieve appropriate BMI  Patient agreed with plan with verbal understanding   "

## 2024-05-19 NOTE — Assessment & Plan Note (Signed)
 stable on levothyroxine  75 mcg

## 2024-05-19 NOTE — Consults (Signed)
 Session ID: [id number]  Session Duration: 23 minutes  Language: Nepali  Interpreter ID: #[id number]  Interpreter Name: Abraham

## 2024-05-20 ENCOUNTER — Encounter: Payer: Medicare (Managed Care) | Attending: Internal Medicine | Primary: Internal Medicine

## 2024-05-20 NOTE — Consults (Signed)
 Session ID: [id number]  Session Duration: 3 minutes  Language: Nepali  Interpreter ID: #[id number]  Interpreter Name: Abraham

## 2024-05-21 ENCOUNTER — Encounter

## 2024-05-21 MED ORDER — METFORMIN HCL ER 750 MG PO TB24
750 | ORAL_TABLET | Freq: Every day | ORAL | 3 refills | Status: AC
Start: 2024-05-21 — End: ?

## 2024-05-21 NOTE — Telephone Encounter (Signed)
"  Recent Visits  Date Type Provider Dept   05/19/24 Office Visit Vannie Cara KIDD, MD [facility]   03/03/24 Office Visit Vannie Cara KIDD, MD [facility]   01/22/24 Office Visit Vannie Cara KIDD, MD [facility]   12/25/23 Office Visit Vannie Cara KIDD, MD [facility]   09/25/23 Office Visit Vannie Cara KIDD, MD [facility]   07/10/23 Office Visit Vannie Cara KIDD, MD [facility]   03/01/23 Office Visit Vannie Cara KIDD, MD [facility]   02/22/23 Office Visit Vannie Cara KIDD, MD [facility]   Showing recent visits within past 540 days with a meds authorizing provider and meeting all other requirements  Future Appointments  Date Type Provider Dept   07/22/24 Appointment Vannie Cara KIDD, MD [facility]   Showing future appointments within next 150 days with a meds authorizing provider and meeting all other requirements     05/19/2024    "

## 2024-05-22 NOTE — Progress Notes (Signed)
 Error

## 2024-05-27 ENCOUNTER — Ambulatory Visit
Admit: 2024-05-27 | Discharge: 2024-05-27 | Payer: Medicare (Managed Care) | Attending: Internal Medicine | Primary: Internal Medicine

## 2024-05-27 VITALS — BP 120/74 | HR 67 | Ht 60.6 in | Wt 149.6 lb

## 2024-05-27 DIAGNOSIS — R0602 Shortness of breath: Principal | ICD-10-CM

## 2024-05-27 NOTE — Consults (Signed)
 Session ID: [id number]  Session Duration: 27 minutes  Language: Nepali  Interpreter ID: #[id number]  Interpreter Name: Marcus

## 2024-05-27 NOTE — Progress Notes (Signed)
 "Michael Knight    Date of Birth: [birth year]     Date of Service:  05/27/2024     Chief Complaint   Patient presents with    3 Month Follow-Up       HPI Nepali interpreter was utilized for the encounter.  Patient's son has accompanied who also provided me with the history.  Patient states that he has been to the ER as recently as 12/3 to [facility] with problems related to gastric bloating and shortness of breath.  He still has difficulty with his breathing and complains of cough and wheezing particularly at nighttime.    He was previously investigated with EGD in August which showed significant food material in his stomach with gastric distention, later confirmed to be related to severe gastroparesis on gastric emptying study.  He is taking Protonix  40 mg daily, was previously on Reglan  which was subsequently discontinued following discharge.  Patient has not yet been reviewed by GI since discharge, has appointment in January per son.    No Known Allergies  Outpatient Medications Marked as Taking for the 05/27/24 encounter (Office Visit) with Lanning Laurance DEL, MD   Medication Sig Dispense Refill    metFORMIN  (GLUCOPHAGE -XR) 750 MG extended release tablet TAKE 1 TABLET BY MOUTH DAILY WITH BREAKFAST 90 tablet 3    simethicone  (MYLICON) 80 MG chewable tablet Take 1 tablet by mouth every 6 hours as needed      Multiple Vitamins-Minerals (MULTIVITAMIN ADULTS) TABS Take 1 tablet by mouth daily (with breakfast) 90 tablet 3    pravastatin  (PRAVACHOL ) 40 MG tablet TAKE 1 TABLET BY MOUTH ONCE NIGHTLY 30 tablet 11    traZODone  (DESYREL ) 50 MG tablet TAKE 1.5 TABLET BY MOUTH ONCE NIGHTLY 45 tablet 5    celecoxib  (CELEBREX ) 200 MG capsule TAKE 1 CAPSULE BY MOUTH 2 TIMES A DAY 60 capsule 3    meclizine  (ANTIVERT ) 12.5 MG tablet Take 1 tablet by mouth 3 times daily as needed for Dizziness 90 tablet 3    amLODIPine  (NORVASC ) 10 MG tablet Take 1 tablet by mouth daily 30 tablet 11    magnesium  gluconate (MAGONATE) 500 MG tablet Take 1  tablet by mouth daily 30 tablet 5    benzonatate  (TESSALON ) 200 MG capsule TAKE 1 CAPSULE BY MOUTH 3 TIMES A DAY AS NEEDED FOR COUGH 30 capsule 0    Blood Glucose Monitoring Suppl (ACCU-CHEK GUIDE ME) w/Device KIT USE TO TEST BLOOD GLUCOSE ONCE DAILY. ALTERNATE BETWEEN MORNING FASTING BLOOD SUGAR AND BEDTIME (2 HOURS AFTER EATING). 1 kit 0    topiramate  (TOPAMAX ) 100 MG tablet Take 1 tablet by mouth nightly 60 tablet 5    levothyroxine  (SYNTHROID ) 75 MCG tablet Take 1 tablet by mouth Daily 30 tablet 11    fluticasone -salmeterol (ADVAIR  DISKUS) 250-50 MCG/ACT AEPB diskus inhaler Inhale 1 puff into the lungs in the morning and 1 puff in the evening. 60 each 3    albuterol  sulfate HFA (PROVENTIL ;VENTOLIN ;PROAIR ) 108 (90 Base) MCG/ACT inhaler Inhale 2 puffs into the lungs 4 times daily as needed for Wheezing 18 g 3    metoclopramide  (REGLAN ) 5 MG tablet Take 1 tablet by mouth 3 times daily 90 tablet 1    Incontinence Supply Disposable (DEPEND ADJUSTABLE UNDERWEAR) MISC 1 each by Does not apply route in the morning, at noon, and at bedtime 90 each 5    benzonatate  (TESSALON ) 100 MG capsule Take 1 capsule by mouth Three times daily as needed      levothyroxine  (  SYNTHROID ) 25 MCG tablet Take 1 tablet by mouth Daily      blood glucose monitor strips by Other route daily Test 1 time a day. Alternate between morning fasting blood sugar,  and bedtime (2 hours after eating). 50 strip 5    Lancets MISC 1 each by Does not apply route daily Test 1 time a day. Alternate between morning fasting blood sugar,  and bedtime (2 hours after eating). 300 each 5    pantoprazole  (PROTONIX ) 40 MG tablet Take 1 tablet by mouth in the morning and at bedtime 60 tablet 0    tamsulosin  (FLOMAX ) 0.4 MG capsule Take 1 capsule by mouth nightly      Ubrogepant  (UBRELVY ) 100 MG TABS Take 100 mg by mouth daily as needed (migraine headaches) 16 tablet 5    Misc. Devices (CANE) MISC 1 each by Does not apply route daily 1 each 0    propranolol  (INDERAL ) 40  MG tablet TAKE 1 TABLET BY MOUTH EVERY DAY WITH BREAKFAST 30 tablet 11       Immunization History   Administered Date(s) Administered    COVID-19, Inactive, PFIZER PURPLE top, DILUTE for use, (age 21 y+) 08/25/2019, 09/15/2019, 06/09/2020    Influenza, FLUAD, (age 31 y+), IM, Trivalent PF, 0.80mL 04/13/2016, 03/03/2024    Influenza, FLUBLOK, (age 66 y+), Quadv PF, 0.51mL 05/09/2019    Pneumococcal, PCV20, PREVNAR 20, (age 6w+), IM, 0.5mL 01/22/2024       Past Medical History:   Diagnosis Date    Asthma     Back pain     Dyslipidemia     GERD (gastroesophageal reflux disease)     Hyperlipidemia     Hypertension     Hypothyroidism     TB (tuberculosis)      Past Surgical History:   Procedure Laterality Date    BRONCHOSCOPY N/A 10/28/2020    BRONCHOSCOPY ALVEOLAR LAVAGE performed by Laurance VEAR Leu, MD at [facility] END[facility]    ELBOW SURGERY Right     HAND SURGERY      UPPER GASTROINTESTINAL ENDOSCOPY N/A 02/08/2018    EGD BIOPSY performed by Garnette SHAUNNA Lunger, MD at [facility] ENDOSCOPY    UPPER GASTROINTESTINAL ENDOSCOPY N/A 01/16/2024    ESOPHAGOGASTRODUODENOSCOPY DIAGNOSTIC ONLY performed by Toniann Ranks, MD at [facility] END[facility]COPY     No family history on file.    Review of Systems:  Review of Systems   Constitutional:  Negative for activity change, appetite change, fatigue, fever and unexpected weight change.   HENT:  Negative for congestion, ear discharge, ear pain, postnasal drip, rhinorrhea, sinus pressure, sneezing, sore throat, tinnitus and voice change.    Respiratory:  Positive for cough, chest tightness, shortness of breath and wheezing. Negative for apnea, choking and stridor.    Cardiovascular:  Negative for chest pain, palpitations and leg swelling.   Gastrointestinal:  Positive for abdominal distention. Negative for abdominal pain, blood in stool, constipation, diarrhea and vomiting.        GERD   Musculoskeletal:  Negative for arthralgias, back pain and gait problem.   Skin:  Negative for color change,  pallor and rash.   Allergic/Immunologic: Negative for environmental allergies.   Neurological:  Negative for dizziness, tremors, seizures, syncope, speech difficulty, weakness, light-headedness, numbness and headaches.   Hematological:  Negative for adenopathy. Does not bruise/bleed easily.   Psychiatric/Behavioral:  Negative for sleep disturbance.        Vitals:    05/27/24 1348   BP: 120/74  BP Site: Left Upper Arm   Patient Position: Sitting   BP Cuff Size: Large Adult   Pulse: 67   SpO2: 98%   Weight: 67.9 kg (149 lb 9.6 oz)   Height: 1.539 m (5' 0.6)          No data to display               Body mass index is 28.64 kg/m.     Wt Readings from Last 3 Encounters:   05/27/24 67.9 kg (149 lb 9.6 oz)   05/20/24 67.6 kg (149 lb)   05/19/24 67.6 kg (149 lb)     BP Readings from Last 3 Encounters:   05/27/24 120/74   05/19/24 100/62   03/03/24 120/68         Physical Exam  Constitutional:       General: He is not in acute distress.     Appearance: He is well-developed. He is not ill-appearing or diaphoretic.   HENT:      Nose: No congestion or rhinorrhea.      Mouth/Throat:      Pharynx: No oropharyngeal exudate or posterior oropharyngeal erythema.   Cardiovascular:      Rate and Rhythm: Normal rate and regular rhythm.      Heart sounds: Normal heart sounds. No murmur heard.     No friction rub.   Pulmonary:      Effort: No respiratory distress.      Breath sounds: Normal breath sounds. No wheezing, rhonchi or rales.   Chest:      Chest wall: No tenderness.   Abdominal:      General: There is no distension.      Palpations: There is no mass.      Tenderness: There is no abdominal tenderness. There is no guarding or rebound.   Musculoskeletal:         General: No swelling, tenderness or deformity.   Lymphadenopathy:      Cervical: No cervical adenopathy.   Skin:     Coloration: Skin is not pale.      Findings: No erythema, lesion or rash.   Neurological:      Mental Status: He is alert and oriented to person, place,  and time. Mental status is at baseline.      Motor: No abnormal muscle tone.         Health Maintenance   Topic Date Due    Diabetic foot exam  Never done    Diabetic Alb to Cr ratio (uACR) test  Never done    Diabetic retinal exam  Never done    DTaP/Tdap/Td vaccine (1 - Tdap) Never done    Shingles vaccine (1 of 2) Never done    Respiratory Syncytial Virus (RSV) Pregnant or age 52 yrs+ (1 - Risk 60-74 years 1-dose series) Never done    COVID-19 Vaccine (4 - 2025-26 season) 02/11/2024    Lipids  02/29/2024    Colorectal Cancer Screen  04/01/2024    GFR test (Diabetes, CKD 3-4, OR last GFR 15-59)  01/13/2025    A1C test (Diabetic or Prediabetic)  01/21/2025    Depression Screen  03/03/2025    Annual Wellness Visit (Medicare Advantage)  Completed    Flu vaccine  Completed    Pneumococcal 50+ years Vaccine  Completed    AAA screen  Completed    Hepatitis C screen  Completed    Hepatitis A vaccine  Aged Out    Hepatitis B  vaccine  Aged Out    Hib vaccine  Aged Out    Polio vaccine  Aged Out    Meningococcal (ACWY) vaccine  Aged Out    Meningococcal B vaccine  Aged Out    Pneumococcal 0-49 years Vaccine  Discontinued    Diabetes screen  Discontinued    HIV screen  Discontinued    Prostate Specific Antigen (PSA) Screening or Monitoring  Discontinued        Assessment/Plan:    Cough and shortness of breath related to gastric distention and abdominal bloating from gastroparesis.  Continues with PPI.  Patient has another evaluation with GI in January.    Symptoms of asthmatic bronchitis, not in exacerbation.  Currently on Advair  250, 1 puff twice daily and albuterol  inhaler as needed.  Prior PFT from 2022 was normal with no clear evidence of obstructive airway disease.    Return in about 3 months (around 08/25/2024).  "

## 2024-06-02 ENCOUNTER — Encounter: Payer: Medicare (Managed Care) | Attending: Internal Medicine | Primary: Internal Medicine

## 2024-06-04 LAB — FECAL DNA COLORECTAL CANCER SCREENING (COLOGUARD)

## 2024-06-06 NOTE — Result Encounter Note (Signed)
"  Please use Nepali interpreter service let patient know his Colog[employer] sample could not be processed.  Warehouse manager of  [employer] will be sending a new testing kit.  Please have family member who speaks English scan the QR code on the box and watch the instructional video.  That individual should then give the patient detailed instructions on how to collect the stool sample.  "

## 2024-06-09 NOTE — Result Encounter Note (Signed)
"  Called and spoke with the daughter of patient Michael Knight). She verbalized understanding about the process for sample collection and how can she help her dad.   "

## 2024-06-17 NOTE — Telephone Encounter (Signed)
 Called and notified patient's daughter Sheilda). She verbalized understanding.

## 2024-06-17 NOTE — Telephone Encounter (Signed)
 Submitted PA for Ubrelvy  100MG  tablets  Via [facility] Key: [id number] STATUS: This medication was previously approved on [id number] from 2024-06-12 to 2025-06-11.     Please notify the patient.    If this requires a response please respond to the pool ( P MHCX PSC MEDICATION PRE-AUTH).

## 2024-07-10 ENCOUNTER — Encounter

## 2024-07-10 MED ORDER — CELECOXIB 200 MG PO CAPS
200 | ORAL_CAPSULE | Freq: Two times a day (BID) | ORAL | 1 refills | Status: AC
Start: 2024-07-10 — End: ?

## 2024-07-10 NOTE — Telephone Encounter (Signed)
 LastVisit 05/19/2024   LastLabs  None  NextVisit 07/22/2024   LastRefilled 04/07/24  Pharmacy:    [facility] [id number] - [place], [place] - [address] - [place] [phone] [place] [phone]  [address] [place] [address]  Phone: [phone] Fax: [phone]     pharmacy confirmed in [facility]

## 2024-07-22 ENCOUNTER — Encounter: Payer: Medicare (Managed Care) | Attending: Internal Medicine | Primary: Internal Medicine
